# Patient Record
Sex: Male | Born: 1955 | Race: White | Hispanic: No | Marital: Married | Smoking: Never smoker
Health system: Southern US, Community
[De-identification: ages and names within clinical notes are randomized; demographics above are authoritative.]

## PROBLEM LIST (undated history)

## (undated) DIAGNOSIS — T8859XA Other complications of anesthesia, initial encounter: Secondary | ICD-10-CM

## (undated) DIAGNOSIS — R42 Dizziness and giddiness: Secondary | ICD-10-CM

## (undated) DIAGNOSIS — Q893 Situs inversus: Secondary | ICD-10-CM

## (undated) DIAGNOSIS — K219 Gastro-esophageal reflux disease without esophagitis: Secondary | ICD-10-CM

## (undated) DIAGNOSIS — G5600 Carpal tunnel syndrome, unspecified upper limb: Secondary | ICD-10-CM

## (undated) DIAGNOSIS — J45909 Unspecified asthma, uncomplicated: Secondary | ICD-10-CM

## (undated) DIAGNOSIS — T7840XA Allergy, unspecified, initial encounter: Secondary | ICD-10-CM

## (undated) DIAGNOSIS — I10 Essential (primary) hypertension: Secondary | ICD-10-CM

## (undated) DIAGNOSIS — F39 Unspecified mood [affective] disorder: Secondary | ICD-10-CM

## (undated) DIAGNOSIS — G8929 Other chronic pain: Secondary | ICD-10-CM

## (undated) DIAGNOSIS — E291 Testicular hypofunction: Secondary | ICD-10-CM

## (undated) DIAGNOSIS — S83271A Complex tear of lateral meniscus, current injury, right knee, initial encounter: Secondary | ICD-10-CM

## (undated) DIAGNOSIS — S43431A Superior glenoid labrum lesion of right shoulder, initial encounter: Secondary | ICD-10-CM

## (undated) DIAGNOSIS — I739 Peripheral vascular disease, unspecified: Secondary | ICD-10-CM

## (undated) DIAGNOSIS — I517 Cardiomegaly: Secondary | ICD-10-CM

## (undated) DIAGNOSIS — M25561 Pain in right knee: Secondary | ICD-10-CM

## (undated) DIAGNOSIS — M5441 Lumbago with sciatica, right side: Secondary | ICD-10-CM

## (undated) DIAGNOSIS — R7989 Other specified abnormal findings of blood chemistry: Secondary | ICD-10-CM

## (undated) DIAGNOSIS — M4316 Spondylolisthesis, lumbar region: Secondary | ICD-10-CM

## (undated) DIAGNOSIS — R7303 Prediabetes: Secondary | ICD-10-CM

## (undated) DIAGNOSIS — Z6841 Body Mass Index (BMI) 40.0 and over, adult: Secondary | ICD-10-CM

## (undated) DIAGNOSIS — Z01818 Encounter for other preprocedural examination: Secondary | ICD-10-CM

## (undated) DIAGNOSIS — N281 Cyst of kidney, acquired: Secondary | ICD-10-CM

## (undated) DIAGNOSIS — M7989 Other specified soft tissue disorders: Secondary | ICD-10-CM

## (undated) DIAGNOSIS — F5102 Adjustment insomnia: Secondary | ICD-10-CM

## (undated) DIAGNOSIS — R079 Chest pain, unspecified: Secondary | ICD-10-CM

## (undated) DIAGNOSIS — G4730 Sleep apnea, unspecified: Secondary | ICD-10-CM

## (undated) DIAGNOSIS — E559 Vitamin D deficiency, unspecified: Secondary | ICD-10-CM

## (undated) DIAGNOSIS — D751 Secondary polycythemia: Secondary | ICD-10-CM

## (undated) DIAGNOSIS — J029 Acute pharyngitis, unspecified: Secondary | ICD-10-CM

## (undated) DIAGNOSIS — M48061 Spinal stenosis, lumbar region without neurogenic claudication: Secondary | ICD-10-CM

## (undated) DIAGNOSIS — S83511A Sprain of anterior cruciate ligament of right knee, initial encounter: Secondary | ICD-10-CM

## (undated) DIAGNOSIS — M5442 Lumbago with sciatica, left side: Secondary | ICD-10-CM

## (undated) DIAGNOSIS — F411 Generalized anxiety disorder: Secondary | ICD-10-CM

## (undated) DIAGNOSIS — M5416 Radiculopathy, lumbar region: Secondary | ICD-10-CM

## (undated) DIAGNOSIS — M479 Spondylosis, unspecified: Secondary | ICD-10-CM

## (undated) DIAGNOSIS — M79662 Pain in left lower leg: Secondary | ICD-10-CM

## (undated) DIAGNOSIS — R1011 Right upper quadrant pain: Secondary | ICD-10-CM

## (undated) DIAGNOSIS — M79606 Pain in leg, unspecified: Secondary | ICD-10-CM

## (undated) HISTORY — PX: EYE SURGERY: SHX253

## (undated) HISTORY — PX: CARDIAC ELECTROPHYSIOLOGY STUDY AND ABLATION: SHX1294

---

## 2010-05-16 MED ORDER — MIDAZOLAM 1 MG/ML IJ SOLN
1 mg/mL | INTRAMUSCULAR | Status: DC | PRN
Start: 2010-05-16 — End: 2010-05-18
  Administered 2010-05-16 (×2): via INTRAVENOUS

## 2010-05-16 MED ORDER — MEPERIDINE (PF) 100 MG/ML INJ SOLUTION
100 mg/ml | INTRAMUSCULAR | Status: DC | PRN
Start: 2010-05-16 — End: 2010-05-18
  Administered 2010-05-16: 11:00:00 via INTRAVENOUS

## 2010-05-16 MED ORDER — SODIUM CHLORIDE 0.9 % IV
INTRAVENOUS | Status: DC
Start: 2010-05-16 — End: 2010-05-18
  Administered 2010-05-16: 11:00:00 via INTRAVENOUS

## 2010-05-16 MED FILL — MIDAZOLAM 1 MG/ML IJ SOLN: 1 mg/mL | INTRAMUSCULAR | Qty: 5

## 2010-05-16 MED FILL — DEMEROL (PF) 100 MG/ML INJECTION SYRINGE: 100 mg/mL | INTRAMUSCULAR | Qty: 1

## 2010-05-16 NOTE — Op Note (Signed)
Op Notes  signed by Serita Grammes at 06/13/10 1810                 Author: Serita Grammes  Service: --  Author Type: Physician       Filed: 06/13/10 1810  Date of Service: 05/16/10 1744  Status: Signed          Editor: Serita Grammes          <!--EPICS-->                            [facility]<BR>                           [address]<BR>                          [place] [address]<BR>                               [phone]<BR> <BR>                             OPERATIVE REPORT<BR> <BR> NAME:  Steven [name], Steven [name]                            MR:  [id number]<BM> LOC:  EN                    SEX:  M               ACCT:  [id number]  DOB:  October 08, 1956            AGE:  53              PT:  X<BR> ADMIT:  05/16/2010          DSCH:                 MSV:  GI<BR> <BR> <BR> PREPROCEDURE DIAGNOSIS:  Open access screening.<BR> <BR> POSTPROCEDURE DIAGNOSIS:  Polypoid protrusion from the appendiceal<BR>  orifice. Biopsies taken.<BR> <BR> NAME OF PROCEDURE:  Colonoscopy with biopsy.<BR> <BR> SURGEON:  Elwin Sleight. Rica Heather, MD<BR> <BR> ANESTHESIA<BR> 1. Demerol 100.<BR> 2. Versed 6.<BR> <BR> INSTRUMENTS:  CF-H180AL<BR> <BR> FINDINGS:  The digital rectal has good  sphincter tone. No masses. The<BR> prostate is 2+. No nodules and the scope was introduced and advanced to<BR> the cecum. He had a very redundant looping tortuous transverse both<BR> flexures, but the mucosa was normal.<BR> <BR> Once in the cecum and  the base of the cecum at the appendiceal orifice is<BR> a slight villous-like protrusion and biopsies were taken. I did not<BR> attempt to snare removed. It was not clearly a polyp, it was more or less<BR> just a fleshy protrusion along 1 side. Almost  as if it were mucosa that<BR> had inverted. The surrounding cecum was normal. The ileocecal valve was<BR> normal. The ascending colon was normal. Transverse both flexures normal.<BR> Splenic flexure and descending were normal. The sigmoid was tortuous,<BR>   contained numerous tics, otherwise normal. The rectum was normal.<BR> <BR> CONCLUSION<BR> 1. Diverticulosis.<BR> 2. Fleshy protrusion in the appendiceal orifice. Biopsies are pending. I<BR> suspect this is just an inverted mucosa.<BR> <BR> PLAN:  Await  biopsies and if typical appendiceal mucosa, we  will plan no<BR> further intervention. On the other hand, if this is a true villous or<BR> tubular kind of polyp, he will need a subsequent colonoscopy and<BR> polypectomy or either an appendectomy.<BR> <BR>  ADDENDUM:  CT of his abdomen may be a reasonable alternative depending on<BR> biopsy results.<BR> <BR> <BR> <BR> <BR> <BR> <BR> <BR> Serita Grammes, MD     A<BR> <BR>             This is an unverified document unless signed by physician.<BR> <BR> TID:   wmx                                      DT:  05/16/2010  5:44 P<BR> JOB:  [id number]        DOC#:  [id number]           DD:  05/16/2010<BR> <BR> cc:   Heather L. Alvan Dame, MD<BR>       Serita Grammes, MD<BR> <!--EPICE-->

## 2010-05-16 NOTE — Progress Notes (Signed)
Pt taking po fluids well. Passing flatus. No c/o pain. Discharged home via wheelchair with family. DISCHARGED BY BETH RICE

## 2010-05-16 NOTE — H&P (Signed)
[facility]   [address]   [place], [place]. [address]   [phone]     HISTORY AND PHYSICAL    NAME: Steven, [name] MR: [id number]  LOC: EN SEX: Steven [name]: [id number]  DOB: [birth year] AGE: 54 PT: X  ADMIT: 05/16/2010 DSCH: MSV: GI      HISTORY: This is a 54 year old with no real health issues, who is in for  a screening colonoscopy. He has a negative family history, and a negative  past history.    PHYSICAL EXAMINATION  GENERAL: He is alert and oriented.  LUNGS: Clear.  CARDIAC: Regular rhythm.  ABDOMEN: Rounded, full, no organomegaly, or mass.    PLAN: Colonoscopy.                Serita Grammes, MD     This is an unverified document unless signed by physician.    TID: wmx DT: 05/16/2010 8:11 A  JOB: [id number] DOC#: [id number] DD: 05/16/2010     cc: Serita Grammes, MD

## 2010-05-16 NOTE — Op Note (Signed)
[facility]   [address]   [place], [place]. [address]   [phone]     OPERATIVE REPORT    NAME: Steven [name], Steven [name] MR: [id number]  LOC: EN SEX: Gillermina Hu: [id number]  DOB: [birth year] AGE: 54 PT: X  ADMIT: 05/16/2010 DSCH: MSV: GI      PREPROCEDURE DIAGNOSIS: Open access screening.    POSTPROCEDURE DIAGNOSIS: Polypoid protrusion from the appendiceal  orifice. Biopsies taken.    NAME OF PROCEDURE: Colonoscopy with biopsy.    SURGEON: Elwin Sleight. Evonnie Dawes, MD    ANESTHESIA  1. Demerol 100.  2. Versed 6.    INSTRUMENTS: CF-H180AL    FINDINGS: The digital rectal has good sphincter tone. No masses. The  prostate is 2+. No nodules and the scope was introduced and advanced to  the cecum. He had a very redundant looping tortuous transverse both  flexures, but the mucosa was normal.    Once in the cecum and the base of the cecum at the appendiceal orifice is  a slight villous-like protrusion and biopsies were taken. I did not  attempt to snare removed. It was not clearly a polyp, it was more or less  just a fleshy protrusion along 1 side. Almost as if it were mucosa that  had inverted. The surrounding cecum was normal. The ileocecal valve was  normal. The ascending colon was normal. Transverse both flexures normal.  Splenic flexure and descending were normal. The sigmoid was tortuous,  contained numerous tics, otherwise normal. The rectum was normal.    CONCLUSION  1. Diverticulosis.  2. Fleshy protrusion in the appendiceal orifice. Biopsies are pending. I  suspect this is just an inverted mucosa.    PLAN: Await biopsies and if typical appendiceal mucosa, we will plan no  further intervention. On the other hand, if this is a true villous or  tubular kind of polyp, he will need a subsequent colonoscopy and  polypectomy or either an appendectomy.    ADDENDUM: CT of his abdomen may be a reasonable alternative depending on  biopsy results.                Serita Grammes, MD A      This is an unverified document unless signed by physician.    TID: wmx DT: 05/16/2010 5:44 P  JOB: [id number] DOC#: [id number] DD: 05/16/2010    cc: Herbert Seta L. Alvan Dame, MD   Serita Grammes, MD

## 2011-04-29 LAB — HM COLONOSCOPY

## 2011-11-02 LAB — MRI/CT POC CREATININE
Creatinine (POC): 1.1 MG/DL — ABNORMAL HIGH (ref 0.6–1.0)
GFRAA, POC: >60 mL/min/{1.73_m2} (ref >60)
GFRNA, POC: >60 mL/min/{1.73_m2} (ref >60)

## 2011-11-02 MED ORDER — SALINE PERIPHERAL FLUSH PRN
Freq: Once | INTRAMUSCULAR | Status: AC
Start: 2011-11-02 — End: 2011-11-02
  Administered 2011-11-02: 20:00:00

## 2011-11-02 MED ORDER — DIATRIZOATE MEGLUMINE & SODIUM 66 %-10 % ORAL SOLN
66-10 % | Freq: Once | ORAL | Status: AC
Start: 2011-11-02 — End: 2011-11-02
  Administered 2011-11-02: 20:00:00 via ORAL

## 2011-11-02 MED ORDER — SODIUM CHLORIDE 0.9% BOLUS IV
0.9 % | Freq: Once | INTRAVENOUS | Status: AC
Start: 2011-11-02 — End: 2011-11-02
  Administered 2011-11-02: 20:00:00 via INTRAVENOUS

## 2011-11-02 MED ORDER — IOVERSOL 350 MG/ML IV SOLN
350 mg iodine/mL | Freq: Once | INTRAVENOUS | Status: AC
Start: 2011-11-02 — End: 2011-11-02
  Administered 2011-11-02: 20:00:00 via INTRAVENOUS

## 2011-11-12 NOTE — Progress Notes (Signed)
Outpatient Rehab at [facility]   [address], [address] [place], [place] [address]  Phone:[phone]   Fax:[phone]  OUTPATIENT DAILY NOTE    NAME/AGE/GENDER: Steven [name] is a 56 y.o. male.     DATE: 11/12/2011    Patient did not show up for appointment today due to unknown reason.  Will plan to follow up on next scheduled visit.    Marlane Mingle, PT

## 2011-11-21 NOTE — Progress Notes (Signed)
Outpatient Rehab at [facility]   [address] [place], [place] [address]  [phone]   Fax:[phone]  Outpatient PHYSICAL THERAPY: Initial Assessment  Fall Risk Score: 1 (? 5 = High Risk)  Treatment Diagnosis: Lumbago  REFERRING PHYSICIAN: Dr. Alvan Dame  MD Orders: Eval and treat  Return Physician Appointment: TBD  MEDICAL/REFERRING DIAGNOSIS: Lumbago, L LE weakness  DATE OF ONSET: 09/2011   PRIOR LEVEL OF FUNCTION: Independent  PRECAUTIONS/ALLERGIES: NKA  ASSESSMENT:  ????????This section established at most recent assessment??????????  PROBLEM LIST (Impairments causing functional limitations):  1. Decreased Strength affecting function  2. Decreased ADL/Functional Activities  3. Increased Pain affecting function  GOALS: (Goals have been discussed and agreed upon with patient.)  SHORT-TERM FUNCTIONAL GOALS: Time Frame: 4 weeks  1. Decrease OLBPI 2-3% to allow sitting with less pain.  2. Increase L hip strength to allow t-mill without limping.  DISCHARGE GOALS: Time Frame: 8 weeks  1. Decrease OLBPI +5% to allow an independent HEP.  2. Increase L hip strength to allow proper step downs without pain.  3. Progress to a HEP.  REHABILITATION POTENTIAL FOR STATED GOALS: goodPLAN OF CARE:  INTERVENTIONS PLANNED: (Benefits and precautions of physical therapy have been discussed with the patient.)  1. home exercise program (HEP)  2. therapeutic exercise/strengthening  TREATMENT PLAN EFFECTIVE DATES: 11/21/2011 TO 01/19/2012  FREQUENCY/DURATION: Follow patient 1 time a week for 8 weeks to address above goals.  Regarding Steven [name]'s therapy, I certify that the treatment plan above will be carried out by a therapist or under their direction.  Thank you for this referral,  Lilla Shook   PT  Referring Physician Signature: Dr. Alvan Dame         Date                       SUBJECTIVE:  History of Present Injury/Illness (Reason for Referral): The patient reported injuring his L lumbar region with  intermittent L sciatica approximately three years ago. Since then his symptoms intermittently reappear and he he has noted an increased L LE limp with fatigue. He has been referred to physical therapy for treatment.  Present Symptoms: L SI pain and L LE weakness with a limp.   Pain Intensity 1: 2  Dominant Side: right  Past Medical History: Occasional Lumbago  Current Medications: Per Chart   Date Last Reviewed: 11/21/2011  Social History/Home Situation: Married , Independent  Work/Activity History: Office  OBJECTIVE:  Outcome Measure: Tool Used: Modified Oswestry Low Back Pain Questionnaire  Score:  Initial: 11% Disability Most Recent: X% Disability (Date: -- )    Interpretation of Score: Each section is scored on a 0-5 scale, 5 representing the greatest disability.  The scores of each section are added together for a total score of 50.  This number is multiplied by 2 to get a percent score. This value represents the percentage disability: 0-20% minimal disability; 20-40% moderate disability; 40-60% severe disability; 60-80% crippled; and 80-100% bed-bound or exaggerated symptom behavior.  Minimal detectable change is 8% and minimal clinical important difference is 6%.  Observation/Orthostatic Postural Assessment:   WNL  Palpation:   Mild tenderness to L SI joint.  ROM: WNL                          Strength:    -4/5 for L gluteus medius, abductors and quads.   5/5 for R LE.  Functional Mobility:   Independent with a mild L limp.  Balance:   Good  TREATMENT:    (In addition to Assessment/Re-Assessment sessions the following treatments were rendered)  Therapeutic Exercise: ( 45):  The patient received an evaluation followed by exercises as below to improve mobility and strength.  Required minimal verbal and manual cues to promote proper body mechanics.  Progressed resistance, range and repetitions as indicated.   The patient received instruction and demonstrated strengthening exercises for his L LE to  include sidelying clam shell with green t-band, sidelying abduction, and single leg step downs from a 6" step.  He is to continue his use of his t-mill and inversion table as he presently performs.    Manual Therapy (     ):   Therapeutic Modalities:                                                                                               HEP: As above; handouts given to patient for all exercises.  ______________________________________________________________________________________________________  Treatment Assessment:  Patient???s response to today???s treatment session was tolerated well with no complications.  The patient tolerated the exercise instruction well with no exacerbation in symptoms. He is to incorporate the exercises in his present routine and will be revaluated in a few weeks to DC to a HEP.  Progression/Medical Necessity:   ?? Skilled intervention continues to be required due to low back pain, and L LE weakness limiting ADLs..   Reason for Continuation of Services/Other Comments:  ?? Patient continues to require skilled intervention due to L LE weakness and low back pain limiting ADLs..  Recommendations/Intent for next treatment session: "Treatment next visit will focus on advancements to more challenging activities".  Total Treatment Duration:  PT Patient Time In/Time Out  Time In: 1100  Time Out: 1200    Lilla Shook PT

## 2011-11-21 NOTE — Progress Notes (Signed)
Ambulatory/Rehab Services H2 Model Falls Risk Assessment    Risk Factor Pts. ??   Confusion/Disorientation/Impulsivity  []    4 ??   Symptomatic Depression  []   2 ??   Altered Elimination  []   1 ??   Dizziness/Vertigo  []   1 ??   Gender (Male)  [x]   1 ??   Any administered antiepileptics (anticonvulsants):  []   2 ??   Any administered benzodiazepines:  []   1 ??   Visual Impairment (specify):  []   1 ??   Portable Oxygen Use  []   1 ??   Orthostatic ? BP  []   1 ??   History of Recent Falls (within 3 mos.)  []   5     Ability to Rise from Chair (choose one) Pts. ??   Ability to rise in a single movement  []   0 ??   Pushes up, successful in one attempt  []   1 ??   Multiple attempts, but successful  []   3 ??   Unable to rise without assistance  []   4   Total: (5 or greater = High Risk) 1     Falls Prevention Plan:   []                Physical Limitations to Exercise (specify):   []                Mobility Assistance Device (type):   []                Exercise/Equipment Adaptation (specify):    ??2010 [employer] All Rights Reserved. [place] Patent #7,282,031. Federal Law prohibits the replication, distribution or use without written permission from [employer]

## 2012-04-06 NOTE — Progress Notes (Signed)
Outpatient Rehab at [facility]   [address], [place], [place] [address]  Phone:[phone]   Fax:[phone]  Outpatient PHYSICAL THERAPY: Discharge  Fall Risk Score: 1 (? 5 = High Risk)  Treatment Diagnosis: Lumbago  REFERRING PHYSICIAN: Dr. Alvan Dame  MD Orders: Eval and treat  Return Physician Appointment: TBD  MEDICAL/REFERRING DIAGNOSIS: Lumbago, L LE weakness  DATE OF ONSET: 09/2011   PRIOR LEVEL OF FUNCTION: Independent  PRECAUTIONS/ALLERGIES: NKA  ASSESSMENT:  ????????This section established at most recent assessment??????????  PROBLEM LIST (Impairments causing functional limitations):  1. Decreased Strength affecting function  2. Decreased ADL/Functional Activities  3. Increased Pain affecting function  GOALS: (Goals have been discussed and agreed upon with patient.)  SHORT-TERM FUNCTIONAL GOALS: Time Frame: 4 weeks    UNABLE TO ASSESS GOALS     VOLUNTARY DC.  1. Decrease OLBPI 2-3% to allow sitting with less pain.  2. Increase L hip strength to allow t-mill without limping.  DISCHARGE GOALS: Time Frame: 8 weeks  1. Decrease OLBPI +5% to allow an independent HEP.  2. Increase L hip strength to allow proper step downs without pain.  3. Progress to a HEP.  REHABILITATION POTENTIAL FOR STATED GOALS: goodPLAN OF CARE:  INTERVENTIONS PLANNED: (Benefits and precautions of physical therapy have been discussed with the patient.)  1. home exercise program (HEP)  2. therapeutic exercise/strengthening  TREATMENT PLAN EFFECTIVE DATES: 11/21/2011 TO 01/19/2012  FREQUENCY/DURATION: Follow patient 1 time a week for 8 weeks to address above goals.  Regarding Andre Swander Brumley's therapy, I certify that the treatment plan above will be carried out by a therapist or under their direction.  Thank you for this referral,  Lilla Shook, PT   PT  Referring Physician Signature: Dr. Alvan Dame         Date                       SUBJECTIVE:  History of Present Injury/Illness (Reason for Referral): The patient reported  injuring his L lumbar region with intermittent L sciatica approximately three years ago. Since then his symptoms intermittently reappear and he he has noted an increased L LE limp with fatigue. He has been referred to physical therapy for treatment.  Present Symptoms: L SI pain and L LE weakness with a limp.   Pain Intensity 1: 2  Dominant Side: right  Past Medical History: Occasional Lumbago  Current Medications: Per Chart   Date Last Reviewed: 11/21/2011  Social History/Home Situation: Married , Independent  Work/Activity History: Office  OBJECTIVE:  Outcome Measure: Tool Used: Modified Oswestry Low Back Pain Questionnaire  Score:  Initial: 11% Disability Most Recent: X% Disability (Date: -- )    Interpretation of Score: Each section is scored on a 0-5 scale, 5 representing the greatest disability.  The scores of each section are added together for a total score of 50.  This number is multiplied by 2 to get a percent score. This value represents the percentage disability: 0-20% minimal disability; 20-40% moderate disability; 40-60% severe disability; 60-80% crippled; and 80-100% bed-bound or exaggerated symptom behavior.  Minimal detectable change is 8% and minimal clinical important difference is 6%.  Observation/Orthostatic Postural Assessment:   WNL  Palpation:   Mild tenderness to L SI joint.  ROM: WNL                          Strength:    -4/5 for L  gluteus medius, abductors and quads.   5/5 for R LE.             Functional Mobility:   Independent with a mild L limp.  Balance:   Good  TREATMENT:    (In addition to Assessment/Re-Assessment sessions the following treatments were rendered)  Recommendations/Intent for next treatment session:Voluntary DC.    Lilla Shook, PT

## 2012-04-07 ENCOUNTER — Encounter

## 2012-05-21 MED ORDER — TESTOSTERONE CYPIONATE 200 MG/ML IM OIL
200 mg/mL | Freq: Once | INTRAMUSCULAR | Status: AC
Start: 2012-05-21 — End: 2012-05-21

## 2012-06-04 MED ORDER — TESTOSTERONE CYPIONATE 200 MG/ML IM OIL
200 mg/mL | Freq: Once | INTRAMUSCULAR | Status: AC
Start: 2012-06-04 — End: 2012-06-04

## 2012-06-18 MED ORDER — TESTOSTERONE CYPIONATE 200 MG/ML IM OIL
200 mg/mL | Freq: Once | INTRAMUSCULAR | Status: AC
Start: 2012-06-18 — End: 2012-06-18

## 2012-07-02 MED ORDER — DULOXETINE 30 MG CAP, DELAYED RELEASE
30 mg | ORAL_CAPSULE | Freq: Every day | ORAL | Status: DC
Start: 2012-07-02 — End: 2013-02-25

## 2012-07-02 MED ORDER — DULOXETINE 60 MG CAP, DELAYED RELEASE
60 mg | ORAL_CAPSULE | Freq: Every day | ORAL | Status: DC
Start: 2012-07-02 — End: 2012-10-02

## 2012-07-02 MED ORDER — ALPRAZOLAM 1 MG TAB
1 mg | ORAL_TABLET | Freq: Three times a day (TID) | ORAL | Status: DC | PRN
Start: 2012-07-02 — End: 2014-04-21

## 2012-07-02 MED ORDER — NEBIVOLOL 20 MG TAB
20 mg | ORAL_TABLET | Freq: Every evening | ORAL | Status: DC
Start: 2012-07-02 — End: 2013-02-25

## 2012-07-02 MED ORDER — LEVALBUTEROL HFA 45 MCG/ACTUATION AEROSOL INHALER
45 mcg/actuation | RESPIRATORY_TRACT | Status: DC | PRN
Start: 2012-07-02 — End: 2013-02-25

## 2012-07-02 NOTE — Patient Instructions (Signed)
Heart Attack: After Your Visit  Your Care Instructions  A heart attack (myocardial infarction, or MI) occurs when one or more of the coronary arteries, which supply the heart with oxygen-rich blood, is blocked. A blockage usually occurs when plaque inside the artery breaks open and a blood clot forms in the artery.  After a heart attack, you may be worried about your future. Over the next several weeks, your heart will start to heal. Although it is sometimes hard to break old habits, you can prevent another heart attack by making some lifestyle changes and by taking medicines. You may use the following information for ideas about what to do at home to speed your recovery.  Follow-up care is a key part of your treatment and safety. Be sure to make and go to all appointments, and call your doctor if you are having problems. It???s also a good idea to know your test results and keep a list of the medicines you take.  How can you care for yourself at home?  Activity  ?? Increase your activities slowly. Take short rest breaks when you get tired.   ?? If your doctor recommends it, get more exercise. Walking is a good choice. Bit by bit, increase the amount you walk every day. Try for at least 30 minutes on most days of the week. You also may want to swim, bike, or do other activities. Talk with your doctor before you start an exercise program to make sure it is safe for you.   ?? If your doctor has not set you up with a cardiac rehabilitation (rehab) program, talk to him or her about whether that is right for you. Cardiac rehab includes supervised exercise, help with diet and lifestyle changes, and emotional support. It may reduce your risk of future heart problems.   ?? Do not drive until your doctor says you can.   ?? You can have sex as soon as you feel ready for it. Often this means when you can easily walk around or climb stairs. Talk with your doctor if you have any concerns. Do not take sildenafil citrate (Viagra),  tadalafil (Cialis), or vardenafil (Levitra) if you are taking nitroglycerin.   Lifestyle changes  ?? Do not smoke. Smoking increases your risk of another heart attack. If you need help quitting, talk to your doctor about stop-smoking programs and medicines. These can increase your chances of quitting for good.   ?? Eat a heart-healthy diet that is low in cholesterol, saturated fat, and salt, and is full of fruits, vegetables and whole-grains. Eat at least two servings of fish each week. You may get more details about how to eat healthy, but these tips can help you get started.   ?? Avoid colds and flu. Get a pneumococcal vaccine shot. If you have had one before, ask your doctor whether you need a second dose. Get the flu vaccine every fall. If you must be around people with colds or flu, wash your hands often.   Medicines  ?? Take your medicines exactly as prescribed. Call your doctor if you think you are having a problem with your medicine. You will get more details on the specific medicines your doctor prescribes. You may need several medicines.   ?? Angiotensin-converting enzyme (ACE) inhibitors, beta-blockers, and statins can help prevent another heart attack. ACE inhibitors control your blood pressure, and statins help lower cholesterol.   ?? Aspirin and other blood thinners help prevent blood clots. Blood clots can cause   a stroke or heart attack.   ?? If your doctor has given you nitroglycerin, keep it with you at all times. If you have angina symptoms, such as chest pain, sit down and rest, and take the first dose of nitroglycerin as directed. If symptoms get worse or are not getting better within 5 minutes, call 911 immediately. Stay on the phone with the emergency operator; he or she will give you further instructions.   ?? Be sure to tell your doctor about any angina symptoms, such as chest pain, you have had, even if they went away.   ?? Do not take any over-the-counter medicines, vitamins, or herbal products  without talking to your doctor first.   Mental health  ?? Talk to your family, friends, or a counselor about your feelings. It is normal to feel frightened, angry, hopeless, helpless, and even guilty. Talking openly about bad feelings can help you cope. If you have symptoms of depression, talk to your doctor.   When should you call for help?  Call 911 anytime you think you may need emergency care. For example, call if:  ?? You have symptoms of a heart attack. These may include:   ?? Chest pain or pressure, or a strange feeling in the chest.   ?? Sweating.   ?? Shortness of breath.   ?? Nausea or vomiting.   ?? Pain, pressure, or a strange feeling in the back, neck, jaw, or upper belly or in one or both shoulders or arms.   ?? Lightheadedness or sudden weakness.   ?? A fast or irregular heartbeat.   After you call 911, the operator may tell you to chew 1 adult-strength or 2 to 4 low-dose aspirin. Wait for an ambulance. Do not try to drive yourself.  ?? You passed out (lost consciousness).   ?? You feel like you are having another heart attack.   Call your doctor now or seek immediate medical care if:  ?? You have had any chest pain, even if it has gone away.   ?? You have new or increased shortness of breath.   ?? You are dizzy or lightheaded, or you feel like you may faint.   Watch closely for changes in your health, and be sure to contact your doctor if you have any problems.    Where can you learn more?    Go to http://www.healthwise.net/[facility]   Enter H564 in the search box to learn more about "Heart Attack: After Your Visit."    ?? 2006-2013 [employer] Inc[employer]. Care instructions adapted under license by [employer] (which disclaims liability or warranty for this information). This care instruction is for use with your licensed healthcare professional. If you have questions about a medical condition or this instruction, always ask your healthcare professional. Healthwise, Incorporated disclaims any warranty or  liability for your use of this information.  Content Version: 9.7.130178; Last Revised: May 17, 2010

## 2012-07-02 NOTE — Progress Notes (Signed)
HISTORY OF PRESENT ILLNESS  Steven [name] is a 56 y.o. male.  Cough  The history is provided by the patient. This is a new problem. The current episode started 2 days ago. The problem occurs constantly. The problem has not changed since onset.Associated symptoms include abdominal pain (epigastric pressure during the episodes) and shortness of breath (just during the 2 episodes he had recently). Pertinent negatives include no chest pain (denies) and no headaches. The symptoms are aggravated by exertion and walking. Nothing relieves the symptoms. He has tried nothing for the symptoms.   Panic Attack  The history is provided by the patient. This is a recurrent problem. Episode onset: He has a history of generalized anxiety but he has had 2 "scarey" episodes at night recently that have caused him to come in to get treated.  The problem occurs every several days (He has the anxiety constantly but he had the "panic attacks" a few days apart. He believes that they were panic attacks but his wife believes that the 2nd one that happened a few nights ago was cardiac in origin. ). The problem has been gradually worsening. Associated symptoms include abdominal pain (epigastric pressure during the episodes) and shortness of breath (just during the 2 episodes he had recently). Pertinent negatives include no chest pain (denies) and no headaches. Associated symptoms comments: During the 2nd episode he was sweating profusely, short of breath, had orthopnea, was very anxious and c/o epigastric "pressure." This episode lasted 4 hours. His wife stated that he just didn't look right and she tried to get him to go to the ER but he refused.  . The symptoms are aggravated by stress (States that he has alot of stress at work right now.). Nothing (States that he has taken Cymbalta in the past and it really helped him. ) relieves the symptoms. He has tried nothing for the symptoms.   He BP is higher today than it has been since 1/13 and the  only change that has been made in his regimen is that he started on Testosterone shots in mid-July because his testosterone level was 145 on 04/28/12 even though he was applying Androgel as directed.     Review of Systems   Constitutional: Positive for malaise/fatigue. Negative for fever, chills, weight loss and diaphoresis.   HENT: Negative for ear pain, congestion, sore throat and neck pain.    Eyes: Negative for blurred vision, photophobia, pain and redness.   Respiratory: Positive for cough, sputum production and shortness of breath (just during the 2 episodes he had recently). Negative for wheezing.    Cardiovascular: Positive for palpitations (during the episodes). Negative for chest pain (denies) and leg swelling.   Gastrointestinal: Positive for abdominal pain (epigastric pressure during the episodes). Negative for heartburn, nausea, vomiting, diarrhea and constipation.   Genitourinary: Negative for dysuria, urgency and frequency.   Musculoskeletal: Negative for myalgias and back pain.   Skin: Negative for itching and rash.   Neurological: Negative for dizziness, sensory change, focal weakness, weakness and headaches.   Psychiatric/Behavioral: Negative for depression and suicidal ideas. The patient is nervous/anxious and has insomnia.      Blood pressure 154/98, pulse 77, temperature 98.1 ??F (36.7 ??C), temperature source Oral, height 5\' 11"  (1.803 m), weight 254 lb 3.2 oz (115.304 kg), SpO2 98.00%.    Physical Exam   Nursing note and vitals reviewed.  Constitutional: He is oriented to person, place, and time. He appears well-developed and well-nourished. He appears ill (he looks  exhausted). No distress.   HENT:   Head: Normocephalic and atraumatic.   Right Ear: Hearing, tympanic membrane, external ear and ear canal normal.   Left Ear: Hearing, tympanic membrane, external ear and ear canal normal.   Nose: Mucosal edema present. No rhinorrhea. Right sinus exhibits no maxillary sinus tenderness and no frontal  sinus tenderness. Left sinus exhibits no maxillary sinus tenderness and no frontal sinus tenderness.   Mouth/Throat: Uvula is midline, oropharynx is clear and moist and mucous membranes are normal. No oral lesions.   Neck: Normal range of motion. Neck supple. No JVD present. No thyromegaly present.   Cardiovascular: Normal rate, regular rhythm, normal heart sounds and intact distal pulses.  Exam reveals no gallop and no friction rub.    No murmur heard.  Pulmonary/Chest: Effort normal and breath sounds normal. He has no wheezes. He exhibits no tenderness.        He has a frequent, tight, wheezy cough.   Abdominal: Soft. Bowel sounds are normal. He exhibits no distension. There is no tenderness.   Musculoskeletal: Normal range of motion. He exhibits no edema.   Neurological: He is alert and oriented to person, place, and time. He has normal strength. Gait normal.   Skin: Skin is warm, dry and intact.   Psychiatric: His speech is normal and behavior is normal. Judgment and thought content normal. His mood appears anxious. His affect is not angry, not blunt, not labile and not inappropriate. Cognition and memory are normal. He does not exhibit a depressed mood.   EKG today shows sinus rhythm with HR 71 and a Q wave in the septal lead. His last EKG on 08/24/11 showed NSR with HR 68.     ASSESSMENT and PLAN  1. Shortness of breath  ELECTROCARDIOGRAM, COMPLETE, REFERRAL TO CARDIOLOGY   2. Abnormal finding on EKG  REFERRAL TO CARDIOLOGY   3. Essential hypertension, benign  nebivolol (BYSTOLIC) 20 mg tablet   4. Acute bronchitis     5. Acute bronchospasm  levalbuterol tartrate (XOPENEX HFA) 45 mcg/actuation inhaler   6. Generalized anxiety disorder  DULoxetine (CYMBALTA) 30 mg capsule, DULoxetine (CYMBALTA) 60 mg capsule   7. Panic disorder without agoraphobia  ALPRAZolam (XANAX) 1 mg tablet   8. Other testicular hypofunction     It is late in the day and the lab has closed so he will RTO tomorrow to have a CBC, CMP, Lipid  Panel, TSH, HgbA1C, Testosterone, Free Testosterone, PSA, UA and urine microalbumin done. Cardiology referral done for f/u on the abnormal EKG. I have increased his Bystolic from 10 mg qd to 20 mg qhs. He will continue to take the Lisinopril 40 mg qAM. I have restarted him on Cymbalta as it really helped him in the past. He will take 30 mg qhs X 1 week and then go up to 60 mg qhs. I gave him an Rx for Xanax to take if he has another panic attack. I told him to go to the ER if the Xanax does not help and he is still SOB and sweating profusely 20-30 min after taking the Xanax. He will RTO in 1 week for a BP check and in 1 month for a recheck with me or sooner prn.

## 2012-07-04 LAB — AMB POC COMPLETE CBC,AUTOMATED ENTER
ABS. GRANS (POC): 5 10*3/uL
ABS. LYMPHS (POC): 1.7 10*3/uL
ABS. MONOS (POC): 0.4 10*3/uL
GRANULOCYTES (POC): 69.8 %
HCT (POC): 55 %
HGB (POC): 18.4 g/dL
LYMPHOCYTES (POC): 24 %
MCH (POC): 30.8 pg
MCHC (POC): 33.5 g/dL
MCV (POC): 91.9 fL
MONOCYTES (POC): 6.2 %
MPV (POC): 8.2 fL
PLATELET (POC): 178 10*3/uL
RBC (POC): 5.99 M/uL
RDW (POC): 13.9 %
WBC (POC): 7.2 10*3/uL

## 2012-07-04 LAB — AMB POC URINALYSIS DIP STICK MANUAL W/ MICRO
Bilirubin (UA POC): NEGATIVE
Blood (UA POC): NEGATIVE
Glucose (UA POC): NEGATIVE
Ketones (UA POC): NEGATIVE
Leukocyte esterase (UA POC): NEGATIVE
Nitrites (UA POC): NEGATIVE
Specific gravity (UA POC): 1.01 (ref 1.001–1.035)
Urobilinogen (UA POC): 0.2 (ref 0.2–1)
pH (UA POC): 7 (ref 4.6–8.0)

## 2012-07-04 NOTE — Addendum Note (Signed)
Addended by: Delman Kitten on: 07/04/2012 10:14 AM     Modules accepted: Orders

## 2012-07-05 LAB — TESTOSTERONE, TOTAL, ADULT MALE: Testosterone: 284 ng/dL — ABNORMAL LOW (ref 348–1197)

## 2012-07-05 LAB — TESTOSTERONE, FREE: Free testosterone (Direct): 6.4 pg/mL — ABNORMAL LOW (ref 7.2–24.0)

## 2012-07-05 LAB — METABOLIC PANEL, COMPREHENSIVE
A-G Ratio: 1.7 (ref 1.1–2.5)
ALT (SGPT): 40 IU/L (ref 0–44)
AST (SGOT): 34 IU/L (ref 0–40)
Albumin: 4.3 g/dL (ref 3.5–5.5)
Alk. phosphatase: 72 IU/L (ref 44–102)
BUN/Creatinine ratio: 14 (ref 9–20)
BUN: 16 mg/dL (ref 6–24)
Bilirubin, total: 0.5 mg/dL (ref 0.0–1.2)
CO2: 20 mmol/L (ref 19–28)
Calcium: 9.1 mg/dL (ref 8.7–10.2)
Chloride: 103 mmol/L (ref 97–108)
Creatinine: 1.11 mg/dL (ref 0.76–1.27)
GFR est AA: 86 mL/min/{1.73_m2} (ref >59)
GFR est non-AA: 74 mL/min/{1.73_m2} (ref >59)
GLOBULIN, TOTAL: 2.5 g/dL (ref 1.5–4.5)
Glucose: 89 mg/dL (ref 65–99)
Potassium: 5 mmol/L (ref 3.5–5.2)
Protein, total: 6.8 g/dL (ref 6.0–8.5)
Sodium: 138 mmol/L (ref 134–144)

## 2012-07-05 LAB — LIPID PANEL
Cholesterol, total: 178 mg/dL (ref 100–199)
HDL Cholesterol: 31 mg/dL — ABNORMAL LOW (ref >39)
LDL, calculated: 115 mg/dL — ABNORMAL HIGH (ref 0–99)
Triglyceride: 160 mg/dL — ABNORMAL HIGH (ref 0–149)
VLDL, calculated: 32 mg/dL (ref 5–40)

## 2012-07-05 LAB — MICROALBUMIN:CREATININE RATIO, RANDOM URINE
Creatinine, urine random: 178.7 mg/dL (ref 22.0–328.0)
Microalb/Creat ratio (ug/mg creat.): 4.9 mg/g creat (ref 0.0–30.0)
Microalbumin, urine: 8.7 ug/mL (ref 0.0–17.0)

## 2012-07-05 LAB — HEMOGLOBIN A1C WITH EAG: Hemoglobin A1c: 5.5 % (ref 4.8–5.6)

## 2012-07-05 LAB — PSA, DIAGNOSTIC (PROSTATE SPECIFIC AG): Prostate Specific Ag: 0.4 ng/mL (ref 0.0–4.0)

## 2012-07-05 LAB — TSH 3RD GENERATION: TSH: 1.62 u[IU]/mL (ref 0.450–4.500)

## 2012-07-07 MED ORDER — SIMVASTATIN 20 MG TAB
20 mg | ORAL_TABLET | Freq: Every evening | ORAL | Status: DC
Start: 2012-07-07 — End: 2012-10-02

## 2012-07-07 NOTE — Progress Notes (Signed)
Quick Note:    I called Steven [name] and went over his lab results with him in detail. He has only had 2 testosterone injections so I will not change his dose at this time and we will recheck his labs again in early October. He is taking an aspirin daily. His LDL is mildly elevated at 161 but I told him I want him to start on Zocor 20 mg qhs in light of his current symptoms. He is to see a cardiologist on Wednesday, 07/09/12. I will e-prescribe the Zocor for him. kkb  ______

## 2012-07-07 NOTE — Addendum Note (Signed)
Addended by: Delman Kitten on: 07/07/2012 07:48 PM     Modules accepted: Orders

## 2012-07-09 ENCOUNTER — Encounter

## 2012-07-15 ENCOUNTER — Encounter

## 2012-07-15 MED ORDER — SALINE PERIPHERAL FLUSH PRN
Freq: Once | INTRAMUSCULAR | Status: AC
Start: 2012-07-15 — End: 2012-07-15
  Administered 2012-07-15: 12:00:00

## 2012-07-15 MED ORDER — SODIUM CHLORIDE 0.9% BOLUS IV
0.9 % | Freq: Once | INTRAVENOUS | Status: AC
Start: 2012-07-15 — End: 2012-07-15
  Administered 2012-07-15: 12:00:00 via INTRAVENOUS

## 2012-07-15 MED ORDER — IOVERSOL 350 MG/ML IV SOLN
350 mg iodine/mL | Freq: Once | INTRAVENOUS | Status: AC
Start: 2012-07-15 — End: 2012-07-15
  Administered 2012-07-15: 12:00:00 via INTRAVENOUS

## 2012-07-16 MED ORDER — TESTOSTERONE CYPIONATE 200 MG/ML IM OIL
200 mg/mL | Freq: Once | INTRAMUSCULAR | Status: AC
Start: 2012-07-16 — End: 2012-07-16

## 2012-08-01 LAB — AMB POC COMPLETE CBC,AUTOMATED ENTER
ABS. GRANS (POC): 4.2 10*3/uL (ref 1.4–6.5)
ABS. LYMPHS (POC): 1.8 10*3/uL (ref 1.2–3.4)
ABS. MONOS (POC): 0.5 10*3/uL (ref 0.1–0.6)
GRANULOCYTES (POC): 64.9 % (ref 42.2–75.2)
HCT (POC): 55 % (ref 35–60)
HGB (POC): 18.2 g/dL (ref 11–18)
LYMPHOCYTES (POC): 27.4 % (ref 20.5–51.1)
MCH (POC): 30.2 pg (ref 27–31)
MCHC (POC): 33.1 g/dL (ref 33–37)
MCV (POC): 91.2 fL (ref 80–99.9)
MONOCYTES (POC): 7.7 % (ref 1.7–9.3)
MPV (POC): 8.1 fL (ref 7.8–11)
PLATELET (POC): 153 10*3/uL (ref 150–450)
RBC (POC): 6.04 M/uL (ref 4–6)
RDW (POC): 13.5 % (ref 11.6–13.7)
WBC (POC): 6.4 10*3/uL (ref 4.5–10.5)

## 2012-08-01 MED ORDER — PHENTERMINE 37.5 MG TAB
37.5 mg | ORAL_TABLET | ORAL | Status: DC
Start: 2012-08-01 — End: 2013-02-25

## 2012-08-02 LAB — METABOLIC PANEL, COMPREHENSIVE
A-G Ratio: 1.8 (ref 1.1–2.5)
ALT (SGPT): 38 IU/L (ref 0–44)
AST (SGOT): 31 IU/L (ref 0–40)
Albumin: 4.3 g/dL (ref 3.5–5.5)
Alk. phosphatase: 64 IU/L (ref 44–102)
BUN/Creatinine ratio: 17 (ref 9–20)
BUN: 19 mg/dL (ref 6–24)
Bilirubin, total: 0.3 mg/dL (ref 0.0–1.2)
CO2: 19 mmol/L (ref 19–28)
Calcium: 8.8 mg/dL (ref 8.7–10.2)
Chloride: 101 mmol/L (ref 97–108)
Creatinine: 1.1 mg/dL (ref 0.76–1.27)
GFR est AA: 87 mL/min/{1.73_m2} (ref >59)
GFR est non-AA: 75 mL/min/{1.73_m2} (ref >59)
GLOBULIN, TOTAL: 2.4 g/dL (ref 1.5–4.5)
Glucose: 96 mg/dL (ref 65–99)
Potassium: 4.7 mmol/L (ref 3.5–5.2)
Protein, total: 6.7 g/dL (ref 6.0–8.5)
Sodium: 134 mmol/L (ref 134–144)

## 2012-08-02 LAB — TESTOSTERONE, TOTAL, ADULT MALE: Testosterone: 274 ng/dL — ABNORMAL LOW (ref 348–1197)

## 2012-08-02 LAB — PSA, DIAGNOSTIC (PROSTATE SPECIFIC AG): Prostate Specific Ag: 0.3 ng/mL (ref 0.0–4.0)

## 2012-08-03 LAB — TESTOSTERONE, FREE: Free testosterone (Direct): 5.5 pg/mL — ABNORMAL LOW (ref 7.2–24.0)

## 2012-08-03 NOTE — Progress Notes (Signed)
Quick Note:    Please call him and let him know that his PSA and CMP are WNL but his testosterone and Free Testosterone are not any better. Is he getting 200 mg of testosterone q 2 weeks IM? kkb  ______

## 2012-08-03 NOTE — Progress Notes (Signed)
HISTORY OF PRESENT ILLNESS  Steven [name] is a 56 y.o. male.  HPIThe patient is a 56 y.o. male who is seen for follow up of HTN and Hypogonadism.  Symptoms are stable.  The patient is following treatment regimen with good compliance, patient is about the same, no reported side effects of regimen.  He  is following an appropriate diet and is not exercising regularly.       Review of Systems   Constitutional: Negative for weight loss and malaise/fatigue.   Eyes: Negative for blurred vision.   Respiratory: Negative for cough, shortness of breath and wheezing.    Cardiovascular: Negative for chest pain, palpitations and leg swelling.   Gastrointestinal: Negative for nausea and abdominal pain.   Neurological: Negative for dizziness, sensory change, focal weakness and weakness.   Psychiatric/Behavioral: Negative for depression. The patient is nervous/anxious (He has not had any panic attacks since his last visit.). The patient does not have insomnia.    All other systems reviewed and are negative.      Blood pressure 138/78, pulse 58, temperature 97.6 ??F (36.4 ??C), temperature source Oral, height 5\' 11"  (1.803 m), weight 258 lb (117.028 kg), SpO2 99.00%.    Physical Exam   Nursing note and vitals reviewed.  Constitutional: He is oriented to person, place, and time. He appears well-developed and well-nourished. No distress.   HENT:   Head: Normocephalic and atraumatic.   Neck: Normal range of motion. Neck supple. No JVD present. No thyromegaly present.   Cardiovascular: Normal rate, regular rhythm and normal heart sounds.  Exam reveals no gallop and no friction rub.    No murmur heard.  Pulmonary/Chest: Effort normal and breath sounds normal.   Neurological: He is alert and oriented to person, place, and time.   Skin: Skin is warm and dry.   Psychiatric: He has a normal mood and affect. His behavior is normal. Judgment and thought content normal.       ASSESSMENT and PLAN  1. Other testicular hypofunction  INFLUENZA VIRUS  VACCINE, SPLIT, IN INDIVIDS. >=3 YRS OF AGE, IM, AMB POC COMPLETE CBC,AUTOMATED ENTER, METABOLIC PANEL, COMPREHENSIVE, TESTOSTERONE, TOTAL, ADULT MALE, TESTOSTERONE, FREE, PROSTATE SPECIFIC AG (PSA)   2. Essential hypertension, benign     3. Obesity, unspecified  phentermine (ADIPEX-P) 37.5 mg tablet   4. Need for prophylactic vaccination and inoculation against influenza     Labs rechecked. Continue current meds. He will RTO later today to get his next testosterone shot and his flu shot. He wanted to restart the phentermine to help him lose weight. His cardiologist told him to lose 20 lb. He will RTO in 3 months for a recheck and repeat fasting labs.

## 2012-08-04 LAB — AMB POC RAPID STREP A: Group A Strep Ag: NEGATIVE

## 2012-08-04 MED ORDER — TESTOSTERONE CYPIONATE 200 MG/ML IM OIL
200 mg/mL | Freq: Once | INTRAMUSCULAR | Status: AC
Start: 2012-08-04 — End: 2012-08-04

## 2012-08-04 MED ORDER — TRIMETHOPRIM-SULFAMETHOXAZOLE 160 MG-800 MG TAB
160-800 mg | ORAL_TABLET | Freq: Two times a day (BID) | ORAL | Status: DC
Start: 2012-08-04 — End: 2012-10-02

## 2012-08-04 MED ORDER — HYDROCODONE-HOMATROPINE 5 MG-1.5 MG/5 ML ORAL SOLUTION
Freq: Four times a day (QID) | ORAL | Status: DC | PRN
Start: 2012-08-04 — End: 2012-10-02

## 2012-08-04 NOTE — Progress Notes (Signed)
HISTORY OF PRESENT ILLNESS  Steven [name] is a 56 y.o. male.  Sore Throat   The history is provided by the patient. This is a new problem. The current episode started 2 days ago. The problem has been gradually worsening. There has been no fever. Associated symptoms include congestion, headaches, swollen glands and cough (tight, wheezy). Pertinent negatives include no diarrhea, no vomiting, no ear discharge, no ear pain, no plugged ear sensation, no shortness of breath, no stridor, no trouble swallowing and no stiff neck. He has had no exposure to strep or mono. Treatments tried: He started using his albuterol inhaler yesterday. The treatment provided mild relief.       Review of Systems   Constitutional: Positive for malaise/fatigue. Negative for fever and chills.   HENT: Positive for congestion and sore throat. Negative for ear pain, trouble swallowing and ear discharge.    Eyes: Negative for discharge and redness.   Respiratory: Positive for cough (tight, wheezy). Negative for sputum production, shortness of breath and stridor.    Cardiovascular: Negative for chest pain and leg swelling.   Gastrointestinal: Positive for nausea. Negative for vomiting and diarrhea.   Genitourinary: Negative for dysuria.   Musculoskeletal: Negative for myalgias.   Neurological: Positive for headaches.     Blood pressure 130/84, pulse 58, temperature 97.5 ??F (36.4 ??C), height 5\' 11"  (1.803 m), weight 259 lb (117.482 kg), SpO2 97.00%.    Physical Exam   Nursing note and vitals reviewed.  Constitutional: He appears well-developed and well-nourished. He appears ill. No distress.   HENT:   Head: Normocephalic and atraumatic.   Right Ear: Hearing, tympanic membrane, external ear and ear canal normal.   Left Ear: Hearing, tympanic membrane, external ear and ear canal normal.   Nose: Mucosal edema and rhinorrhea present. Right sinus exhibits no maxillary sinus tenderness and no frontal sinus tenderness. Left sinus exhibits no maxillary  sinus tenderness and no frontal sinus tenderness.   Mouth/Throat: Uvula is midline and mucous membranes are normal. Posterior oropharyngeal erythema present. No oropharyngeal exudate or posterior oropharyngeal edema.   Neck: Normal range of motion. Neck supple.   Cardiovascular: Normal rate, regular rhythm and normal heart sounds.    Pulmonary/Chest: Effort normal and breath sounds normal.        He has a frequent, tight, wheezy cough.   Lymphadenopathy:     He has cervical adenopathy.   Neurological: He is alert.   Skin: Skin is warm and dry. No rash noted.     Rapid Strep-negative  ASSESSMENT and PLAN  1. Acute pharyngitis  AMB POC RAPID STREP A, trimethoprim-sulfamethoxazole (BACTRIM DS) 160-800 mg per tablet   2. Acute bronchospasm  HYDROcodone-homatropine (HYCODAN) 5-1.5 mg/5 mL syrup   3. Other testicular hypofunction     4. Need for prophylactic vaccination and inoculation against influenza  INFLUENZA VIRUS VACCINE, SPLIT, IN INDIVIDS. >=3 YRS OF AGE, IM   5. Testicular hypofunction  PR THER/PROPH/DIAG INJECTION, SUBCUT/IM, testosterone cypionate (DEPOTESTOTERONE CYPIONATE) 200 mg/mL injection   He will rest, push fluids, take the meds as directed and RTO if no improvement. Flu shot given as the strep was negative and he has no fever. He was also given his testosterone shot today. He will RTO in 2 weeks for his next testosterone shot and in 3 months for a recheck with me and repeat labs.

## 2012-08-04 NOTE — Patient Instructions (Signed)
Hypogonadism: After Your Visit  Your Care Instructions  Men who have hypogonadism do not make enough testosterone. This hormone allows men to make sperm and to have normal physical male traits. The condition also is known as testosterone deficiency. It can lead to loss of sex drive, weakness, impotence, infertility, and weakened bones.  Many things can cause this condition. Causes include injured testicles, certain medicines, an infection, and aging. Having a long-term health problem such as kidney or liver disease or being obese can cause it. So can surgery or radiation treatment for another health problem. It also can be present at birth. It is most often treated with testosterone hormone. You can get the hormone as a shot or through a patch or gel on the skin.  Follow-up care is a key part of your treatment and safety. Be sure to make and go to all appointments, and call your doctor if you are having problems. It's also a good idea to know your test results and keep a list of the medicines you take.  How can you care for yourself at home?  ?? Take your medicines exactly as prescribed. Call your doctor if you think you are having a problem with your medicine. You will get more details on the specific medicines your doctor prescribes.   ?? Follow your treatment plan. If you use testosterone hormones, follow your doctor's instructions. Hormones can help relieve many of the effects of this condition, such as impotence. But it may take weeks or months for your symptoms to improve.   ?? Get plenty of exercise. And make sure to get plenty of calcium and vitamin D in your diet. Eat more dairy foods and green vegetables. They can help keep your bones from getting weak.   ?? If you have a hard time dealing with this condition, talk to your doctor about joining a support group. Talking with others who have the same problems can help you cope.   When should you call for help?  Call your doctor now or seek immediate medical  care if:  ?? You have headaches.   ?? You have problems with your vision.   Watch closely for changes in your health, and be sure to contact your doctor if:  ?? You have trouble getting or keeping an erection.   ?? You have a loss of body hair.   ?? You feel weak or tired a lot of the time.   ?? Your breasts are getting larger.   ?? You do not get better as expected.     Where can you learn more?    Go to http://www.healthwise.net/[facility]   Enter V434 in the search box to learn more about "Hypogonadism: After Your Visit."    ?? 2006-2013 [employer]. Care instructions adapted under license by [facility] (which disclaims liability or warranty for this information). This care instruction is for use with your licensed healthcare professional. If you have questions about a medical condition or this instruction, always ask your healthcare professional. Healthwise, Incorporated disclaims any warranty or liability for your use of this information.  Content Version: 9.7.130178; Last Revised: November 28, 2011

## 2012-08-19 MED ORDER — TESTOSTERONE CYPIONATE 200 MG/ML IM OIL
200 mg/mL | Freq: Once | INTRAMUSCULAR | Status: AC
Start: 2012-08-19 — End: 2012-08-19

## 2012-09-03 MED ORDER — TESTOSTERONE CYPIONATE 200 MG/ML IM OIL
200 mg/mL | Freq: Once | INTRAMUSCULAR | Status: AC
Start: 2012-09-03 — End: 2012-09-03

## 2012-09-11 MED ORDER — LISINOPRIL 40 MG TAB
40 mg | ORAL_TABLET | Freq: Every day | ORAL | Status: DC
Start: 2012-09-11 — End: 2013-02-25

## 2012-09-17 MED ORDER — TESTOSTERONE CYPIONATE 200 MG/ML IM OIL
200 mg/mL | Freq: Once | INTRAMUSCULAR | Status: AC
Start: 2012-09-17 — End: 2012-09-17

## 2012-09-19 MED ORDER — TESTOSTERONE CYPIONATE 200 MG/ML IM OIL
200 mg/mL | INTRAMUSCULAR | Status: DC
Start: 2012-09-19 — End: 2012-10-01

## 2012-09-19 NOTE — Telephone Encounter (Signed)
I sent in the order for him. kkb

## 2012-09-19 NOTE — Telephone Encounter (Signed)
Pt called requesting a refill on the testosterone 200mg . He would like to get this before his next injection.

## 2012-10-01 MED ORDER — TESTOSTERONE CYPIONATE 200 MG/ML IM OIL
200 mg/mL | INTRAMUSCULAR | Status: DC
Start: 2012-10-01 — End: 2013-02-25

## 2012-10-01 NOTE — Telephone Encounter (Signed)
Pt needs med refilled.

## 2012-10-01 NOTE — Telephone Encounter (Signed)
Called pt and he is aware he can pick up script.

## 2012-10-02 MED ORDER — PRAVASTATIN 40 MG TAB
40 mg | ORAL_TABLET | Freq: Every evening | ORAL | Status: DC
Start: 2012-10-02 — End: 2013-02-25

## 2012-10-02 MED ORDER — AMOXICILLIN CLAVULANATE 875 MG-125 MG TAB
875-125 mg | ORAL_TABLET | Freq: Two times a day (BID) | ORAL | Status: DC
Start: 2012-10-02 — End: 2013-02-25

## 2012-10-02 MED ORDER — TESTOSTERONE CYPIONATE 200 MG/ML IM OIL
200 mg/mL | Freq: Once | INTRAMUSCULAR | Status: DC
Start: 2012-10-02 — End: 2012-10-02

## 2012-10-02 NOTE — Progress Notes (Signed)
HISTORY OF PRESENT ILLNESS  Steven [name] is a 56 y.o. male.  Sinus Pain   The history is provided by the patient. This is a new problem. Episode onset: Symptoms started approximately 2 weeks ago. The problem has not changed since onset.There has been no fever. The pain is at a severity of 5/10. The pain is moderate. The pain has been constant since onset. Associated symptoms include congestion, ear pain, sinus pressure, sore throat, swollen glands, cough (that is productive of green sputum X 2 days), rhinorrhea and headaches. Pertinent negatives include no chills, no sweats, no hoarse voice, no shortness of breath, no neck pain, no neck pain and no chest pain. He has tried decongestants (He has been taking Sudafed 12 hr (120 mg) bid for the congestion.) for the symptoms. The treatment provided moderate relief.     The patient is a 56 y.o. male who is seen for follow up of Hyperlipidemia, HTN and Hypogonadism.  Symptoms are gradually improving.  The patient is having difficulty following treatment regimen, having some side effects as follows: Last week he stopped taking the zocor that his cardiologist started him on in 9/13 because it was causing him to have generalized myalgias. He states that the myalgias pretty much resolved within 2 days of stopping the Zocor. Marland Kitchen  He  is not following an appropriate diet and is not exercising regularly.         Review of Systems   Constitutional: Positive for malaise/fatigue. Negative for fever (initially but not now), chills and diaphoresis.   HENT: Positive for ear pain, congestion, sore throat, rhinorrhea and sinus pressure. Negative for hoarse voice and neck pain.    Eyes: Negative for blurred vision, discharge and redness.   Respiratory: Positive for cough (that is productive of green sputum X 2 days) and sputum production. Negative for shortness of breath and wheezing.    Cardiovascular: Negative for chest pain, orthopnea and leg swelling.   Gastrointestinal: Negative for  heartburn, nausea, vomiting, abdominal pain, diarrhea, constipation, blood in stool and melena.   Genitourinary: Negative for dysuria, urgency and frequency.   Musculoskeletal: Positive for myalgias (Improved since he has been on the Cymbalta.) and joint pain.   Neurological: Positive for headaches. Negative for dizziness, sensory change, focal weakness and weakness.   Psychiatric/Behavioral: The patient is nervous/anxious (Improved since he has been on the Cymbalta.).      Blood pressure 170/90, pulse 68, temperature 98 ??F (36.7 ??C), height 5\' 11"  (1.803 m), weight 265 lb (120.203 kg), SpO2 96.00%.  He has gained 7 lb since 08/01/12.    Physical Exam   Nursing note and vitals reviewed.  Constitutional: He is oriented to person, place, and time. He appears well-developed and well-nourished. No distress.   HENT:   Head: Normocephalic and atraumatic.   Right Ear: Hearing, tympanic membrane, external ear and ear canal normal.   Left Ear: Hearing, external ear and ear canal normal. Tympanic membrane is erythematous. Tympanic membrane is not bulging.  No middle ear effusion.   Nose: Mucosal edema (and edema) and rhinorrhea present. No sinus tenderness. Right sinus exhibits no maxillary sinus tenderness and no frontal sinus tenderness. Left sinus exhibits no maxillary sinus tenderness and no frontal sinus tenderness.   Mouth/Throat: Uvula is midline and mucous membranes are normal. Posterior oropharyngeal erythema present. No oropharyngeal exudate or posterior oropharyngeal edema.   Neck: Normal range of motion. Neck supple.   Cardiovascular: Normal rate, regular rhythm and normal heart sounds.  Pulmonary/Chest: Effort normal and breath sounds normal.   Musculoskeletal: Normal range of motion. He exhibits no edema and no tenderness.   Lymphadenopathy:     He has cervical adenopathy.   Neurological: He is alert and oriented to person, place, and time. Gait normal.   Skin: Skin is warm and dry. No rash noted. No pallor.    Psychiatric: He has a normal mood and affect. His behavior is normal. Judgment and thought content normal.       ASSESSMENT and PLAN  1. Acute upper respiratory infections of unspecified site  amoxicillin-clavulanate (AUGMENTIN) 875-125 mg per tablet   2. Pure hypercholesterolemia  pravastatin (PRAVACHOL) 40 mg tablet   3. Essential hypertension, benign     4. Testicular hypofunction  PR THER/PROPH/DIAG INJECTION, SUBCUT/IM, DISCONTINUED: testosterone cypionate (DEPOTESTOTERONE CYPIONATE) 200 mg/mL injection   His BP is very elevated today at 170/90. This is, most likely, because he has been taking OTC Sudafed 12 hr tabs (120 mg) for sinus congestion for the past 2 weeks. I told him to stop taking the sudafed and switch to OTC Coricidin and/or Afrin Nasal Spray for the congestion. I also recommended that he take OTC Zyrtec 10 mg qhs for the next month or so. He will push fluids and take the Augmentin as directed. I have changed the Zocor to Pravachol and told him that he must take it qhs and if he has side effects with it he is to call back. He was given Depotestosterone 200 mg IM today. He feels like the testosterone injections are helping him to feel better in general. He also stated that the Cymbalta is working well too even though he insists on only taking 30 mg qhs. He will RTO in 1/14 for a recheck and repeat labs or sooner prn.

## 2012-10-02 NOTE — Patient Instructions (Signed)
High Blood Pressure: After Your Visit  Your Care Instructions  If your blood pressure is usually above 140/90, you have high blood pressure, or hypertension. Despite what a lot of people think, high blood pressure usually doesn't cause headaches or make you feel dizzy or lightheaded. It usually has no symptoms. But it does increase your risk for heart attack, stroke, and kidney or eye damage. The higher your blood pressure, the more your risk increases.  Your doctor will give you a goal for your blood pressure. This goal may be below 140/90. Or it may be even lower if you have other health problems, such as diabetes, heart failure, or coronary artery disease.  Changes in your lifestyle, such as staying at a healthy weight, may help you lower your blood pressure. Your treatment also will include medicine. If you stop taking your medicine, your blood pressure will go back up.  Follow-up care is a key part of your treatment and safety. Be sure to make and go to all appointments, and call your doctor if you are having problems. It???s also a good idea to know your test results and keep a list of the medicines you take.  How can you care for yourself at home?  Medical treatment  ?? Take your medicine exactly as prescribed. You may take one or more types of medicine to lower your blood pressure. They include diuretics, beta-blockers, ACE inhibitors, calcium channel blockers, angiotensin II receptor blockers, and other medicines. Call your doctor if you think you are having a problem with your medicine.  ?? Your doctor may suggest that you take one low-dose aspirin (81 mg) a day. This can help reduce your risk of having a stroke or heart attack.  ?? See your doctor at least 2 times a year. You may need to see the doctor more often at first or until your blood pressure comes down.  ?? If you are taking blood pressure medicine, talk to your doctor before you take decongestants or anti-inflammatory medicine, such as ibuprofen.  Some of these medicines can raise blood pressure.  ?? Learn how to check your blood pressure at home.  Lifestyle changes  ?? Stay at a healthy weight. This is especially important if you put on weight around the waist. Losing even 10 pounds can help you lower your blood pressure.  ?? If your doctor recommends it, get more exercise. Walking is a good choice. Bit by bit, increase the amount you walk every day. Try for at least 30 minutes on most days of the week. You also may want to swim, bike, or do other activities.  ?? Avoid or limit alcohol. Talk to your doctor about whether you can drink any alcohol.  ?? Limit salt.  ?? Eat plenty of fruits (such as bananas and oranges), vegetables, legumes, whole grains, and low-fat dairy products.  ?? Lower the amount of saturated fat in your diet. Saturated fat is found in animal products such as milk, cheese, and meat. Limiting these foods may help you lose weight and also lower your risk for heart disease.  ?? Do not smoke. Smoking increases your risk for heart attack and stroke. If you need help quitting, talk to your doctor about stop-smoking programs and medicines. These can increase your chances of quitting for good.  When should you call for help?  Call your doctor now or seek immediate medical care if:  ?? Your blood pressure is much higher than normal (such as 180/110 or higher).  ??   You think high blood pressure is causing symptoms such as:  ?? Severe headache.  ?? Blurry vision.  ?? Nausea or vomiting.  Watch closely for changes in your health, and be sure to contact your doctor if:  ?? You do not get better as expected.    Where can you learn more?    Go to http://www.healthwise.net/[facility]   Enter X567 in the search box to learn more about "High Blood Pressure: After Your Visit."    ?? 2006-2013 [employer]. Care instructions adapted under license by [employer] (which disclaims liability or warranty for this information). This care instruction is for use with  your licensed healthcare professional. If you have questions about a medical condition or this instruction, always ask your healthcare professional. [employer], Inc[employer] disclaims any warranty or liability for your use of this information.  Content Version: 9.8.193578; Last Revised: December 18, 2011

## 2012-10-15 ENCOUNTER — Telehealth

## 2012-10-15 MED ORDER — AZITHROMYCIN 250 MG TAB
250 mg | ORAL_TABLET | ORAL | Status: DC
Start: 2012-10-15 — End: 2013-02-25

## 2012-10-15 MED ORDER — HYDROCODONE-HOMATROPINE 5 MG-1.5 MG/5 ML ORAL SOLUTION
ORAL | Status: DC | PRN
Start: 2012-10-15 — End: 2013-02-25

## 2012-10-15 NOTE — Telephone Encounter (Signed)
He stopped by also and he looks like he feels really badly. His throat is very erythematous and he has bilateral anterior cervical lymphadenopathy. resp even and unlabored. BP elevated at 160/104 but that may be because he is feeling so badly. He will start on the Zithromax, rest, push fluids and RTO in 2 days for a BP check.

## 2012-10-15 NOTE — Telephone Encounter (Signed)
Now he has a cough and needs a cough med. I printed an Rx for him and his wife picked it up for him. Steven [name]

## 2012-10-20 MED ORDER — METFORMIN 1,000 MG TAB
1000 mg | ORAL_TABLET | ORAL | Status: DC
Start: 2012-10-20 — End: 2013-01-22

## 2012-10-20 MED ORDER — METFORMIN 1,000 MG TAB
1000 mg | ORAL_TABLET | ORAL | Status: DC
Start: 2012-10-20 — End: 2012-10-20

## 2013-01-22 MED ORDER — METFORMIN 1,000 MG TAB
1000 mg | ORAL_TABLET | ORAL | Status: DC
Start: 2013-01-22 — End: 2013-02-23

## 2013-02-24 MED ORDER — METFORMIN 1,000 MG TAB
1000 mg | ORAL_TABLET | ORAL | Status: DC
Start: 2013-02-24 — End: 2013-02-25

## 2013-02-25 LAB — AMB POC URINALYSIS DIP STICK MANUAL W/ MICRO
Bilirubin (UA POC): NEGATIVE
Blood (UA POC): NEGATIVE
Glucose (UA POC): NEGATIVE
Ketones (UA POC): NEGATIVE
Leukocyte esterase (UA POC): NEGATIVE
Nitrites (UA POC): NEGATIVE
Specific gravity (UA POC): 1.01 (ref 1.001–1.035)
Urobilinogen (UA POC): 0.2 (ref 0.2–1)
pH (UA POC): 6 (ref 4.6–8.0)

## 2013-02-25 LAB — AMB POC COMPLETE CBC,AUTOMATED ENTER
ABS. GRANS (POC): 4.9 10*3/uL (ref 1.4–6.5)
ABS. LYMPHS (POC): 1.7 10*3/uL (ref 1.2–3.4)
ABS. MONOS (POC): 0.6 10*3/uL (ref 0.1–0.6)
GRANULOCYTES (POC): 68.6 % (ref 42.2–75.2)
HCT (POC): 54.5 % (ref 35–60)
HGB (POC): 18.2 g/dL — AB (ref 11–18)
LYMPHOCYTES (POC): 23.2 % (ref 20.5–51.1)
MCH (POC): 30.6 pg (ref 27–31)
MCHC (POC): 33.4 g/dL (ref 33–37)
MCV (POC): 91.7 fL (ref 80–99.9)
MONOCYTES (POC): 8.2 % (ref 1.7–9.3)
MPV (POC): 8.2 fL (ref 7.8–11)
PLATELET (POC): 149 10*3/uL — AB (ref 150–450)
RBC (POC): 5.94 M/uL (ref 4–6)
RDW (POC): 16 % — AB (ref 11.6–13.7)
WBC (POC): 7.2 10*3/uL (ref 4.5–10.5)

## 2013-02-25 MED ORDER — METFORMIN 1,000 MG TAB
1000 mg | ORAL_TABLET | ORAL | Status: DC
Start: 2013-02-25 — End: 2013-09-04

## 2013-02-25 MED ORDER — TESTOSTERONE CYPIONATE 200 MG/ML IM OIL
200 mg/mL | INTRAMUSCULAR | Status: DC
Start: 2013-02-25 — End: 2013-09-14

## 2013-02-25 MED ORDER — PHENTERMINE 37.5 MG TAB
37.5 mg | ORAL_TABLET | ORAL | Status: DC
Start: 2013-02-25 — End: 2013-09-14

## 2013-02-25 MED ORDER — NEBIVOLOL 20 MG TAB
20 mg | ORAL_TABLET | Freq: Every evening | ORAL | Status: DC
Start: 2013-02-25 — End: 2013-09-14

## 2013-02-25 MED ORDER — PRAVASTATIN 40 MG TAB
40 mg | ORAL_TABLET | Freq: Every evening | ORAL | Status: DC
Start: 2013-02-25 — End: 2013-04-29

## 2013-02-25 MED ORDER — DULOXETINE 30 MG CAP, DELAYED RELEASE
30 mg | ORAL_CAPSULE | Freq: Every day | ORAL | Status: DC
Start: 2013-02-25 — End: 2013-04-09

## 2013-02-25 MED ORDER — LISINOPRIL 40 MG TAB
40 mg | ORAL_TABLET | Freq: Every day | ORAL | Status: DC
Start: 2013-02-25 — End: 2013-09-09

## 2013-02-27 LAB — METABOLIC PANEL, COMPREHENSIVE
A-G Ratio: 1.8 (ref 1.1–2.5)
ALT (SGPT): 57 IU/L — ABNORMAL HIGH (ref 0–44)
AST (SGOT): 37 IU/L (ref 0–40)
Albumin: 4.4 g/dL (ref 3.5–5.5)
Alk. phosphatase: 67 IU/L (ref 39–117)
BUN/Creatinine ratio: 13 (ref 9–20)
BUN: 15 mg/dL (ref 6–24)
Bilirubin, total: 0.4 mg/dL (ref 0.0–1.2)
CO2: 22 mmol/L (ref 19–28)
Calcium: 8.8 mg/dL (ref 8.7–10.2)
Chloride: 101 mmol/L (ref 97–108)
Creatinine: 1.17 mg/dL (ref 0.76–1.27)
GFR est AA: 81 mL/min/{1.73_m2} (ref >59)
GFR est non-AA: 70 mL/min/{1.73_m2} (ref >59)
GLOBULIN, TOTAL: 2.4 g/dL (ref 1.5–4.5)
Glucose: 109 mg/dL — ABNORMAL HIGH (ref 65–99)
Potassium: 4.6 mmol/L (ref 3.5–5.2)
Protein, total: 6.8 g/dL (ref 6.0–8.5)
Sodium: 136 mmol/L (ref 134–144)

## 2013-02-27 LAB — MICROALBUMIN:CREATININE RATIO, RANDOM URINE
Creatinine, urine random: 191.9 mg/dL (ref 22.0–328.0)
Microalb/Creat ratio (ug/mg creat.): 5.3 mg/g creat (ref 0.0–30.0)
Microalbumin, urine: 10.2 ug/mL (ref 0.0–17.0)

## 2013-02-27 LAB — LIPID PANEL
Cholesterol, total: 149 mg/dL (ref 100–199)
HDL Cholesterol: 29 mg/dL — ABNORMAL LOW (ref >39)
LDL, calculated: 90 mg/dL (ref 0–99)
Triglyceride: 148 mg/dL (ref 0–149)
VLDL, calculated: 30 mg/dL (ref 5–40)

## 2013-02-27 LAB — TESTOSTERONE, FREE: Free testosterone (Direct): 9.4 pg/mL (ref 7.2–24.0)

## 2013-02-27 LAB — TESTOSTERONE, TOTAL, ADULT MALE: Testosterone: 502 ng/dL (ref 348–1197)

## 2013-02-27 LAB — TSH 3RD GENERATION: TSH: 2.06 u[IU]/mL (ref 0.450–4.500)

## 2013-02-27 LAB — PSA, DIAGNOSTIC (PROSTATE SPECIFIC AG): Prostate Specific Ag: 0.4 ng/mL (ref 0.0–4.0)

## 2013-02-27 LAB — HEMOGLOBIN A1C WITH EAG: Hemoglobin A1c: 5.9 % — ABNORMAL HIGH (ref 4.8–5.6)

## 2013-03-01 NOTE — Progress Notes (Signed)
HISTORY OF PRESENT ILLNESS  Steven [name] is a 57 y.o. male.  HPI  The patient is a 57 y.o. male who is seen for follow up of Hyperlipidemia, HTN, Pre-Diabetes, Testicular Hypofunction and Anxiety.  Symptoms are stable and well controlled.  The patient is following treatment regimen with good compliance, patient is about the same, no reported side effects of regimen.  He  is not following an appropriate diet and is not exercising regularly.          Review of Systems   Constitutional: Positive for malaise/fatigue. Negative for weight loss.   HENT: Negative for congestion.    Eyes: Negative for blurred vision.   Respiratory: Negative for cough and shortness of breath.    Cardiovascular: Negative for chest pain, palpitations and leg swelling.   Gastrointestinal: Negative for nausea and abdominal pain.   Genitourinary: Negative for dysuria, urgency and frequency.   Skin: Negative for itching and rash.   Neurological: Negative for dizziness, sensory change, focal weakness, weakness and headaches.   Endo/Heme/Allergies: Negative for polydipsia.   Psychiatric/Behavioral: Negative for depression. The patient is nervous/anxious (He has not had any panic attacks since his last visit.). The patient does not have insomnia.    All other systems reviewed and are negative.      Blood pressure 146/88, pulse 70, temperature 97.6 ??F (36.4 ??C), temperature source Oral, height 5\' 11"  (1.803 m), weight 272 lb 3.2 oz (123.469 kg), SpO2 96.00%.  He has gained 13 lb since 10/13.  Physical Exam   Nursing note and vitals reviewed.  Constitutional: He is oriented to person, place, and time. He appears well-developed and well-nourished. He does not appear ill. No distress.   HENT:   Head: Normocephalic and atraumatic.   Neck: Normal range of motion. Neck supple. No JVD present. No thyromegaly present.   Cardiovascular: Normal rate, regular rhythm and normal heart sounds.  Exam reveals no gallop and no friction rub.    No murmur heard.  No  peripheral edema noted.   Pulmonary/Chest: Effort normal and breath sounds normal.   Musculoskeletal: Normal range of motion.   Neurological: He is alert and oriented to person, place, and time. He has normal strength. No cranial nerve deficit or sensory deficit. Gait normal.   Skin: Skin is warm and dry.   Psychiatric: He has a normal mood and affect. His behavior is normal. Judgment and thought content normal.       ASSESSMENT and PLAN    ICD-9-CM    1. Pure hypercholesterolemia 272.0 pravastatin (PRAVACHOL) 40 mg tablet     METABOLIC PANEL, COMPREHENSIVE     LIPID PANEL   2. Other testicular hypofunction 257.2 testosterone cypionate (DEPOTESTOTERONE CYPIONATE) 200 mg/mL injection     TSH, 3RD GENERATION     TESTOSTERONE, FREE     PROSTATE SPECIFIC AG (PSA)     TESTOSTERONE, TOTAL, ADULT MALE   3. Pre-diabetes 790.29 metFORMIN (GLUCOPHAGE) 1,000 mg tablet     HEMOGLOBIN A1C     MICROALBUMIN:CREATININE RATIO, RANDOM URINE      CANCELED: MICROALBUMIN:CREATININE RATIO, RANDOM URINE   4. Essential hypertension, benign 401.1 lisinopril (PRINIVIL, ZESTRIL) 40 mg tablet     nebivolol (BYSTOLIC) 20 mg tablet     AMB POC COMPLETE CBC,AUTOMATED ENTER     AMB POC URINALYSIS DIP STICK MANUAL W/ MICRO   5. Generalized anxiety disorder 300.02 DULoxetine (CYMBALTA) 30 mg capsule   6. Obesity, unspecified 278.00 phentermine (ADIPEX-P) 37.5 mg tablet   Labs  checked and meds refilled as he is feeling fairly well on his current regimen. He was encouraged to STRICTLY follow a low cholesterol, low glycemic index diet and to exercise regularly. He will RTO in 6 months for a recheck and repeat fasting labs.

## 2013-04-09 ENCOUNTER — Encounter

## 2013-04-10 MED ORDER — DULOXETINE 30 MG CAP, DELAYED RELEASE
30 mg | ORAL_CAPSULE | Freq: Every day | ORAL | Status: DC
Start: 2013-04-10 — End: 2013-09-14

## 2013-04-29 MED ORDER — PRAVASTATIN 40 MG TAB
40 mg | ORAL_TABLET | ORAL | Status: DC
Start: 2013-04-29 — End: 2013-08-28

## 2013-08-28 MED ORDER — PRAVASTATIN 40 MG TAB
40 mg | ORAL_TABLET | ORAL | Status: DC
Start: 2013-08-28 — End: 2013-09-14

## 2013-09-03 ENCOUNTER — Encounter

## 2013-09-04 MED ORDER — METFORMIN 1,000 MG TAB
1000 mg | ORAL_TABLET | ORAL | Status: DC
Start: 2013-09-04 — End: 2013-09-14

## 2013-09-04 NOTE — Telephone Encounter (Signed)
He will make an appt for a recheck and fasting labs soon. kkb

## 2013-09-09 ENCOUNTER — Encounter

## 2013-09-09 MED ORDER — LISINOPRIL 40 MG TAB
40 mg | ORAL_TABLET | Freq: Every day | ORAL | Status: DC
Start: 2013-09-09 — End: 2013-09-14

## 2013-09-09 NOTE — Telephone Encounter (Signed)
He has an appt on Monday.

## 2013-09-14 LAB — AMB POC URINALYSIS DIP STICK MANUAL W/ MICRO
Bilirubin (UA POC): NEGATIVE
Blood (UA POC): NEGATIVE
Glucose (UA POC): NEGATIVE
Ketones (UA POC): NEGATIVE
Leukocyte esterase (UA POC): NEGATIVE
Nitrites (UA POC): NEGATIVE
Protein (UA POC): NEGATIVE mg/dL
Specific gravity (UA POC): 1.01 (ref 1.001–1.035)
Urobilinogen (UA POC): 0.2 (ref 0.2–1)
pH (UA POC): 6.5 (ref 4.6–8.0)

## 2013-09-14 MED ORDER — NEBIVOLOL 20 MG TAB
20 mg | ORAL_TABLET | Freq: Every evening | ORAL | Status: DC
Start: 2013-09-14 — End: 2014-04-21

## 2013-09-14 MED ORDER — METFORMIN 1,000 MG TAB
1000 mg | ORAL_TABLET | ORAL | Status: DC
Start: 2013-09-14 — End: 2014-04-15

## 2013-09-14 MED ORDER — PRAVASTATIN 40 MG TAB
40 mg | ORAL_TABLET | ORAL | Status: DC
Start: 2013-09-14 — End: 2014-05-24

## 2013-09-14 MED ORDER — LISINOPRIL 40 MG TAB
40 mg | ORAL_TABLET | Freq: Every day | ORAL | Status: DC
Start: 2013-09-14 — End: 2014-04-15

## 2013-09-14 MED ORDER — DULOXETINE 60 MG CAP, DELAYED RELEASE
60 mg | ORAL_CAPSULE | Freq: Every day | ORAL | Status: DC
Start: 2013-09-14 — End: 2014-04-29

## 2013-09-14 MED ORDER — TESTOSTERONE CYPIONATE 200 MG/ML IM OIL
200 mg/mL | INTRAMUSCULAR | Status: DC
Start: 2013-09-14 — End: 2014-04-29

## 2013-09-14 MED ORDER — PHENTERMINE 37.5 MG TAB
37.5 mg | ORAL_TABLET | ORAL | Status: DC
Start: 2013-09-14 — End: 2014-04-21

## 2013-09-15 LAB — AMB POC COMPLETE CBC,AUTOMATED ENTER
ABS. GRANS (POC): 5.1 10*3/uL (ref 1.4–6.5)
ABS. LYMPHS (POC): 2.3 10*3/uL (ref 1.2–3.4)
ABS. MONOS (POC): 0.1 10*3/uL (ref 0.1–0.6)
GRANULOCYTES (POC): 67.8 % (ref 42.2–75.2)
HCT (POC): 56.9 % (ref 35–60)
HGB (POC): 18.4 g/dL — AB (ref 11–18)
LYMPHOCYTES (POC): 30.7 % (ref 20.5–51.1)
MCH (POC): 29.7 pg (ref 27–31)
MCHC (POC): 32.4 g/dL — AB (ref 33–37)
MCV (POC): 91.7 fL (ref 80–99.9)
MONOCYTES (POC): 1.5 % — AB (ref 1.7–9.3)
MPV (POC): 8.2 fL (ref 7.8–11)
PLATELET (POC): 197 10*3/uL (ref 150–450)
RBC (POC): 6.21 M/uL — AB (ref 4–6)
RDW (POC): 14.5 % — AB (ref 11.6–13.7)
WBC (POC): 7.5 10*3/uL (ref 4.5–10.5)

## 2013-09-15 NOTE — Progress Notes (Signed)
HISTORY OF PRESENT ILLNESS  Steven [name] is a 57 y.o. male.  HPI  The patient is a 57 y.o. male who is seen for follow up of Hyperlipidemia, HTN, Pre-Diabetes, Hypogonadism, Anxiety and Obesity.  Symptoms are symptoms have progressed to a point and plateaued..  The patient is following treatment regimen with good compliance, patient has not improved significantly, no reported side effects of regimen.  He  is not following an appropriate diet and is not exercising regularly.          Review of Systems   Constitutional: Positive for malaise/fatigue. Negative for weight loss.   HENT: Negative for congestion.    Eyes: Negative for blurred vision.   Respiratory: Negative for cough and shortness of breath.    Cardiovascular: Negative for chest pain, palpitations and leg swelling.   Gastrointestinal: Negative for heartburn, nausea, vomiting, abdominal pain, diarrhea, constipation, blood in stool and melena.   Genitourinary: Negative for dysuria, urgency and frequency.   Musculoskeletal: Positive for back pain (Chronic low back pain that radiates down his LLE).   Skin: Negative for itching and rash.   Neurological: Negative for dizziness, sensory change, focal weakness, weakness and headaches.   Endo/Heme/Allergies: Negative for polydipsia.   Psychiatric/Behavioral: Negative for depression, suicidal ideas and substance abuse. The patient is nervous/anxious (He has not had any panic attacks since his last visit.). The patient does not have insomnia.    All other systems reviewed and are negative.      Blood pressure 150/80, pulse 74, temperature 98.4 ??F (36.9 ??C), height 5\' 11"  (1.803 m), weight 270 lb (122.471 kg), SpO2 95.00%.  He has gained 5 lb since 12/13. He believes that his BP is elevated today due to "white coat hypertension."  Physical Exam   Nursing note and vitals reviewed.  Constitutional: He is oriented to person, place, and time. He appears well-developed and well-nourished. He does not appear ill (but he  looks exhausted). No distress.   HENT:   Head: Normocephalic and atraumatic.   Eyes: Conjunctivae and EOM are normal. Pupils are equal, round, and reactive to light. No scleral icterus.   Neck: Normal range of motion. Neck supple. No JVD present. No thyromegaly present.   Cardiovascular: Normal rate, regular rhythm, normal heart sounds and intact distal pulses.    No peripheral edema noted.   Pulmonary/Chest: Effort normal and breath sounds normal.   Abdominal: Soft. Normal appearance and bowel sounds are normal. There is no hepatosplenomegaly. There is no tenderness. There is no CVA tenderness.   Musculoskeletal:        Lumbar back: He exhibits decreased range of motion, tenderness, bony tenderness (Left S-I joint) and pain (primarily in his left low back and it radiates down his LLE). He exhibits no edema, no deformity, no spasm and normal pulse.   Neurological: He is alert and oriented to person, place, and time. He has normal strength. No cranial nerve deficit or sensory deficit. Gait (he ambulates slowly leaning forward with an antalgic gait d/t his chronic low back pain that radiates down his LLE ) abnormal.   Skin: Skin is warm and dry. No rash noted.   Psychiatric: His speech is normal and behavior is normal. Judgment and thought content normal. His mood appears anxious. His affect is not angry, not blunt, not labile and not inappropriate. Cognition and memory are normal. He does not exhibit a depressed mood.       ASSESSMENT and PLAN    ICD-9-CM  1. Pure hypercholesterolemia 272.0 METABOLIC PANEL, COMPREHENSIVE     LIPID PANEL     VITAMIN D, 25 HYDROXY     pravastatin (PRAVACHOL) 40 mg tablet   2. Pre-diabetes 790.29 MICROALBUMIN:CREATININE RATIO, RANDOM URINE     HEMOGLOBIN A1C     metFORMIN (GLUCOPHAGE) 1,000 mg tablet   3. Essential hypertension, benign 401.1 AMB POC COMPLETE CBC,AUTOMATED ENTER     AMB POC URINALYSIS DIP STICK MANUAL W/ MICRO     TSH, 3RD GENERATION     lisinopril (PRINIVIL, ZESTRIL)  40 mg tablet     nebivolol (BYSTOLIC) 20 mg tablet   4. Other testicular hypofunction 257.2 TESTOSTERONE, FREE     PROSTATE SPECIFIC AG (PSA)     TESTOSTERONE, TOTAL, ADULT MALE     testosterone cypionate (DEPOTESTOTERONE CYPIONATE) 200 mg/mL injection   5. Generalized anxiety disorder 300.02 DULoxetine (CYMBALTA) 60 mg capsule   6. Obesity, unspecified 278.00 phentermine (ADIPEX-P) 37.5 mg tablet   7. 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 He has had chronic low back pain that radiates down his LLE for years. The pain has gotten so bad that he actually ambulates stooped over. He has never gotten any treatment for his symptoms or had x-rays or an MRI. Fredia Beets ordered a consult with Dr. Daphine Deutscher at [facility] for f/u care of his back pain. He was encouraged to strictly follow a low cholesterol, low glycemic index diet and to try exercise regularly. He will RTO in 6 months for a recheck and repeat fasting labs.

## 2013-09-16 LAB — VITAMIN D, 25 HYDROXY: VITAMIN D, 25-HYDROXY: 19.1 ng/mL — ABNORMAL LOW (ref 30.0–100.0)

## 2013-09-16 LAB — MICROALBUMIN:CREATININE RATIO, RANDOM URINE
Creatinine, urine random: 156.7 mg/dL (ref 22.0–328.0)
Microalb/Creat ratio (ug/mg creat.): 5.9 mg/g creat (ref 0.0–30.0)
Microalbumin, urine: 9.3 ug/mL (ref 0.0–17.0)

## 2013-09-16 LAB — METABOLIC PANEL, COMPREHENSIVE
A-G Ratio: 1.7 (ref 1.1–2.5)
ALT (SGPT): 57 IU/L — ABNORMAL HIGH (ref 0–44)
AST (SGOT): 46 IU/L — ABNORMAL HIGH (ref 0–40)
Albumin: 4.2 g/dL (ref 3.5–5.5)
Alk. phosphatase: 83 IU/L (ref 39–117)
BUN/Creatinine ratio: 18 (ref 9–20)
BUN: 20 mg/dL (ref 6–24)
Bilirubin, total: 0.3 mg/dL (ref 0.0–1.2)
CO2: 22 mmol/L (ref 18–29)
Calcium: 9.6 mg/dL (ref 8.7–10.2)
Chloride: 99 mmol/L (ref 97–108)
Creatinine: 1.1 mg/dL (ref 0.76–1.27)
GFR est AA: 86 mL/min/{1.73_m2} (ref >59)
GFR est non-AA: 75 mL/min/{1.73_m2} (ref >59)
GLOBULIN, TOTAL: 2.5 g/dL (ref 1.5–4.5)
Glucose: 93 mg/dL (ref 65–99)
Potassium: 4.8 mmol/L (ref 3.5–5.2)
Protein, total: 6.7 g/dL (ref 6.0–8.5)
Sodium: 138 mmol/L (ref 134–144)

## 2013-09-16 LAB — TESTOSTERONE, TOTAL, ADULT MALE: Testosterone: 76 ng/dL — ABNORMAL LOW (ref 348–1197)

## 2013-09-16 LAB — LIPID PANEL
Cholesterol, total: 205 mg/dL — ABNORMAL HIGH (ref 100–199)
HDL Cholesterol: 31 mg/dL — ABNORMAL LOW (ref >39)
LDL, calculated: 100 mg/dL — ABNORMAL HIGH (ref 0–99)
Triglyceride: 371 mg/dL — ABNORMAL HIGH (ref 0–149)
VLDL, calculated: 74 mg/dL — ABNORMAL HIGH (ref 5–40)

## 2013-09-16 LAB — TESTOSTERONE, FREE: Free testosterone (Direct): 1.8 pg/mL — ABNORMAL LOW (ref 7.2–24.0)

## 2013-09-16 LAB — TSH 3RD GENERATION: TSH: 1.82 u[IU]/mL (ref 0.450–4.500)

## 2013-09-16 LAB — HEMOGLOBIN A1C WITH EAG: Hemoglobin A1c: 6.1 % — ABNORMAL HIGH (ref 4.8–5.6)

## 2013-09-16 LAB — PSA, DIAGNOSTIC (PROSTATE SPECIFIC AG): Prostate Specific Ag: 0.3 ng/mL (ref 0.0–4.0)

## 2013-09-22 MED ORDER — ERGOCALCIFEROL (VITAMIN D2) 50,000 UNIT CAP
1250 mcg (50,000 unit) | ORAL_CAPSULE | ORAL | Status: DC
Start: 2013-09-22 — End: 2013-12-11

## 2013-09-22 NOTE — Progress Notes (Signed)
Vit D level 19.1 so I have started him on ergocalciferol. kkb

## 2013-09-22 NOTE — Addendum Note (Signed)
Addended by: Delman Kitten on: 09/22/2013 07:54 AM     Modules accepted: Orders

## 2013-10-08 MED ORDER — METFORMIN 1,000 MG TAB
1000 mg | ORAL_TABLET | ORAL | Status: DC
Start: 2013-10-08 — End: 2014-04-15

## 2013-12-11 ENCOUNTER — Encounter

## 2013-12-11 MED ORDER — ERGOCALCIFEROL (VITAMIN D2) 50,000 UNIT CAP
1250 mcg (50,000 unit) | ORAL_CAPSULE | ORAL | Status: DC
Start: 2013-12-11 — End: 2014-08-19

## 2014-04-13 ENCOUNTER — Encounter

## 2014-04-15 MED ORDER — METFORMIN 1,000 MG TAB
1000 mg | ORAL_TABLET | ORAL | Status: DC
Start: 2014-04-15 — End: 2014-05-16

## 2014-04-15 MED ORDER — LISINOPRIL 40 MG TAB
40 mg | ORAL_TABLET | Freq: Every day | ORAL | Status: DC
Start: 2014-04-15 — End: 2014-05-16

## 2014-04-21 LAB — AMB POC URINALYSIS DIP STICK MANUAL W/ MICRO
Bilirubin (UA POC): NEGATIVE
Blood (UA POC): NEGATIVE
Glucose (UA POC): NEGATIVE
Ketones (UA POC): NEGATIVE
Leukocyte esterase (UA POC): NEGATIVE
Nitrites (UA POC): NEGATIVE
Protein (UA POC): NEGATIVE mg/dL
Specific gravity (UA POC): 1.015 (ref 1.001–1.035)
Urobilinogen (UA POC): 0.2 (ref 0.2–1)
pH (UA POC): 5 (ref 4.6–8.0)

## 2014-04-21 LAB — AMB POC COMPLETE CBC,AUTOMATED ENTER
ABS. GRANS (POC): 4.8 10*3/uL (ref 1.4–6.5)
ABS. LYMPHS (POC): 3.7 10*3/uL (ref 1.2–3.4)
ABS. MONOS (POC): 0.5 10*3/uL (ref 0.1–0.6)
GRANULOCYTES (POC): 53.5 % (ref 42.2–75.2)
HCT (POC): 60.9 % (ref 35–60)
HGB (POC): 19.5 g/dL (ref 11–18)
LYMPHOCYTES (POC): 41.1 % (ref 20.5–51.1)
MCH (POC): 29.7 pg (ref 27–31)
MCHC (POC): 32.1 g/dL (ref 33–37)
MCV (POC): 92.5 fL (ref 80–99.9)
MONOCYTES (POC): 5.1 % (ref 1.7–9.3)
MPV (POC): 7.9 fL (ref 7.8–11)
PLATELET (POC): 183 10*3/uL (ref 150–450)
RBC (POC): 6.58 M/uL (ref 4–6)
RDW (POC): 15.6 % (ref 11.6–13.7)
WBC (POC): 9 10*3/uL (ref 4.5–10.5)

## 2014-04-21 MED ORDER — PHENTERMINE 37.5 MG TAB
37.5 mg | ORAL_TABLET | ORAL | Status: DC
Start: 2014-04-21 — End: 2014-08-23

## 2014-04-22 LAB — HEPATITIS C ANTIBODY: HCV Ab: <0.1 s/co ratio (ref 0.0–0.9)

## 2014-04-22 LAB — HEPATITIS C AB: HEP C VIRUS AB: <0.1 s/co ratio (ref 0.0–0.9)

## 2014-04-23 LAB — METABOLIC PANEL, COMPREHENSIVE
A-G Ratio: 1.9 (ref 1.1–2.5)
ALT (SGPT): 45 IU/L — ABNORMAL HIGH (ref 0–44)
AST (SGOT): 37 IU/L (ref 0–40)
Albumin: 4.7 g/dL (ref 3.5–5.5)
Alk. phosphatase: 72 IU/L (ref 39–117)
BUN/Creatinine ratio: 16 (ref 9–20)
BUN: 20 mg/dL (ref 6–24)
Bilirubin, total: 0.5 mg/dL (ref 0.0–1.2)
CO2: 21 mmol/L (ref 18–29)
Calcium: 10.1 mg/dL (ref 8.7–10.2)
Chloride: 96 mmol/L — ABNORMAL LOW (ref 97–108)
Creatinine: 1.29 mg/dL — ABNORMAL HIGH (ref 0.76–1.27)
GFR est AA: 71 mL/min/{1.73_m2} (ref >59)
GFR est non-AA: 62 mL/min/{1.73_m2} (ref >59)
GLOBULIN, TOTAL: 2.5 g/dL (ref 1.5–4.5)
Glucose: 106 mg/dL — ABNORMAL HIGH (ref 65–99)
Potassium: 4.9 mmol/L (ref 3.5–5.2)
Protein, total: 7.2 g/dL (ref 6.0–8.5)
Sodium: 137 mmol/L (ref 134–144)

## 2014-04-23 LAB — HEMOGLOBIN A1C WITH EAG: Hemoglobin A1c: 6.2 % — ABNORMAL HIGH (ref 4.8–5.6)

## 2014-04-23 LAB — MICROALBUMIN:CREATININE RATIO, RANDOM URINE
Creatinine, urine random: 152 mg/dL (ref 22.0–328.0)
Microalb/Creat ratio (ug/mg creat.): 17.9 mg/g creat (ref 0.0–30.0)
Microalbumin, urine: 27.2 ug/mL — ABNORMAL HIGH (ref 0.0–17.0)

## 2014-04-23 LAB — TESTOSTERONE, TOTAL, ADULT MALE: Testosterone: 310 ng/dL — ABNORMAL LOW (ref 348–1197)

## 2014-04-23 LAB — LIPID PANEL
Cholesterol, total: 186 mg/dL (ref 100–199)
HDL Cholesterol: 31 mg/dL — ABNORMAL LOW (ref >39)
LDL, calculated: 104 mg/dL — ABNORMAL HIGH (ref 0–99)
Triglyceride: 253 mg/dL — ABNORMAL HIGH (ref 0–149)
VLDL, calculated: 51 mg/dL — ABNORMAL HIGH (ref 5–40)

## 2014-04-23 LAB — PSA, DIAGNOSTIC (PROSTATE SPECIFIC AG): Prostate Specific Ag: 0.3 ng/mL (ref 0.0–4.0)

## 2014-04-23 LAB — VITAMIN D, 25 HYDROXY: VITAMIN D, 25-HYDROXY: 34.8 ng/mL (ref 30.0–100.0)

## 2014-04-23 LAB — TSH 3RD GENERATION: TSH: 2.32 u[IU]/mL (ref 0.450–4.500)

## 2014-04-23 LAB — TESTOSTERONE, FREE: Free testosterone (Direct): 11.4 pg/mL (ref 7.2–24.0)

## 2014-04-25 NOTE — Patient Instructions (Signed)
Fatigue: After Your Visit  Your Care Instructions  Fatigue is a feeling of tiredness, exhaustion, or lack of energy. You may feel fatigue because of too much or not enough activity. It can also come from stress, lack of sleep, boredom, and poor diet. Many medical problems, such as viral infections, can cause fatigue. Emotional problems, especially depression, are often the cause of fatigue.  Fatigue is most often a symptom of another problem. Treatment for fatigue depends on the cause. For example, if you have fatigue because you have a certain health problem, treating this problem also treats your fatigue. If depression or anxiety is the cause, treatment may help.  Follow-up care is a key part of your treatment and safety. Be sure to make and go to all appointments, and call your doctor if you are having problems. It's also a good idea to know your test results and keep a list of the medicines you take.  How can you care for yourself at home?  ?? Get regular exercise. But don't overdo it. Go back and forth between rest and exercise.  ?? Get plenty of rest.  ?? Eat a healthy diet. Do not skip meals, especially breakfast.  ?? Reduce your use of caffeine, tobacco, and alcohol. Caffeine is most often found in coffee, tea, cola drinks, and chocolate.  ?? Limit medicines that can cause fatigue. This includes tranquilizers and cold and allergy medicines.  When should you call for help?  Watch closely for changes in your health, and be sure to contact your doctor if:  ?? You have new symptoms such as fever or a rash.  ?? Your fatigue gets worse.  ?? You have been feeling down, depressed, or hopeless. Or you may have lost interest in things that you usually enjoy.  ?? You are not getting better as expected.   Where can you learn more?   Go to [email or web address]  Enter [phone] in the search box to learn more about "Fatigue: After Your Visit."   ?? 2006-2015 [employer]. Care instructions adapted under  license by [employer] (which disclaims liability or warranty for this information). This care instruction is for use with your licensed healthcare professional. If you have questions about a medical condition or this instruction, always ask your healthcare professional. [employer], Incorporated disclaims any warranty or liability for your use of this information.  Content Version: 10.5.422740; Current as of: September 11, 2013              Learning About High Cholesterol  What is high cholesterol?  Cholesterol is a type of fat in your blood. It is needed for many body functions, such as making new cells. Cholesterol is made by your body. It also comes from food you eat.  If you have too much cholesterol, it starts to build up in your arteries. This is called hardening of the arteries, or atherosclerosis. High cholesterol raises your risk of a heart attack and stroke.  There are different types of cholesterol. LDL is the "bad" cholesterol. High LDL can raise your risk for heart disease, heart attack, and stroke. HDL is the "good" cholesterol. High HDL is linked with a lower risk for heart disease, heart attack, and stroke.  Your cholesterol levels help your doctor find out your risk for having a heart attack or stroke.  How can you prevent high cholesterol?  A heart-healthy lifestyle can help you prevent high cholesterol. This lifestyle helps lower your risk for a heart attack and stroke.  ??  Eat heart-healthy foods.  ?? Eat fruits, vegetables, whole grains (like oatmeal), dried beans and peas, nuts and seeds, soy products (like tofu), and fat-free or low-fat dairy products.  ?? Replace butter, margarine, and hydrogenated or partially hydrogenated oils with olive and canola oils. (Canola oil margarine without trans fat is fine.)  ?? Replace red meat with fish, poultry, and soy protein (like tofu).  ?? Limit processed and packaged foods like chips, crackers, and cookies.   ?? Be active. Exercise can improve your cholesterol level. Get at least 30 minutes of exercise on most days of the week. Walking is a good choice. You also may want to do other activities, such as running, swimming, cycling, or playing tennis or team sports.  ?? Stay at a healthy weight. Lose weight if you need to.  ?? Don't smoke. If you need help quitting, talk to your doctor about stop-smoking programs and medicines. These can increase your chances of quitting for good.  How is high cholesterol treated?  The goal of treatment is to reduce your chances of having a heart attack or stroke. The goal is not to lower your cholesterol numbers only.  ?? You may make lifestyle changes, such as eating healthy foods, not smoking, losing weight, and being more active.  ?? You may have to take medicine.  Follow-up care is a key part of your treatment and safety. Be sure to make and go to all appointments, and call your doctor if you are having problems. It's also a good idea to know your test results and keep a list of the medicines you take.   Where can you learn more?   Go to [email or web address]  Enter Q621 in the search box to learn more about "Learning About High Cholesterol."   ?? 2006-2015 Healthwise, Inc[employer]. Care instructions adapted under license by [employer] [place] (which disclaims liability or warranty for this information). This care instruction is for use with your licensed healthcare professional. If you have questions about a medical condition or this instruction, always ask your healthcare professional. Health[employer], Incorporated disclaims any warranty or liability for your use of this information.  Content Version: 10.5.422740; Current as of: December 18, 2013              Learning About High Blood Pressure  What is high blood pressure?     Blood pressure is a measure of how hard the blood pushes against the walls of your arteries. It's normal for blood pressure to go up and down  throughout the day, but if it stays up, you have high blood pressure. Another name for high blood pressure is hypertension.  Two numbers tell you your blood pressure. The first number is the systolic pressure. It shows how hard the blood pushes when your heart is pumping. The second number is the diastolic pressure. It shows how hard the blood pushes between heartbeats, when your heart is relaxed and filling with blood.  A blood pressure of less than 120/80 (say "120 over 80") is ideal for an adult. High blood pressure is 140/90 or higher. Many people fall into the category in between, called prehypertension. People with prehypertension need to make lifestyle changes to bring their blood pressure down and help prevent or delay high blood pressure.  What happens when you have high blood pressure?  ?? Blood flows through your arteries with too much force. Over time, this damages the walls of your arteries. But you can't feel it. High blood pressure usually doesn't cause  symptoms.  ?? Fat and calcium start to build up in your arteries. This buildup is called plaque. Plaque makes your arteries narrower and stiffer. Blood can't flow through them as easily.  ?? This lack of good blood flow starts to damage some of the organs in your body. This can lead to problems such as coronary artery disease and heart attack, heart failure, stroke, kidney failure, and eye damage.  How can you prevent high blood pressure?  ?? Stay at a healthy weight.  ?? Try to limit how much sodium you eat to less than 2,300 milligrams (mg) a day. And try to limit the sodium you eat to less than 1,500 mg a day if you are 51 or older, are black, or have high blood pressure, diabetes, or chronic kidney disease.  ?? Buy foods that are labeled "unsalted," "sodium-free," or "low-sodium." Foods labeled "reduced-sodium" and "light sodium" may still have too much sodium.  ?? Flavor your food with garlic, lemon juice, onion, vinegar, herbs, and  spices instead of salt. Do not use soy sauce, steak sauce, onion salt, garlic salt, mustard, or ketchup on your food.  ?? Use less salt (or none) when recipes call for it. You can often use half the salt a recipe calls for without losing flavor.  ?? Be physically active. Get at least 30 minutes of exercise on most days of the week. Walking is a good choice. You also may want to do other activities, such as running, swimming, cycling, or playing tennis or team sports.  ?? Limit alcohol to 2 drinks a day for men and 1 drink a day for women.  ?? Eat plenty of fruits, vegetables, and low-fat dairy products. Eat less saturated and total fats.  How is high blood pressure treated?  ?? Your doctor will suggest making lifestyle changes. For example, your doctor may ask you to eat healthy foods, quit smoking, lose extra weight, and be more active.  ?? If lifestyle changes don't help enough or your blood pressure is very high, you will have to take medicine every day.  Follow-up care is a key part of your treatment and safety. Be sure to make and go to all appointments, and call your doctor if you are having problems. It's also a good idea to know your test results and keep a list of the medicines you take.   Where can you learn more?   Go to [email or web address]  Enter P501 in the search box to learn more about "Learning About High Blood Pressure."   ?? 2006-2015 Healthwise, Incorporated. Care instructions adapted under license by [employer] (which disclaims liability or warranty for this information). This care instruction is for use with your licensed healthcare professional. If you have questions about a medical condition or this instruction, always ask your healthcare professional. [employer], Inc[employer] disclaims any warranty or liability for your use of this information.  Content Version: 10.5.422740; Current as of: December 18, 2013              Snoring: After Your Visit  Your Care Instructions   Snoring is a noise that you may make while breathing during sleep. You snore when the flow of air from your mouth or nose to your lungs makes the tissues of your throat vibrate while you sleep. This usually is caused by a blockage or narrowing in your nose, mouth, or throat (airway).  Snoring can be soft, loud, raspy, harsh, hoarse, or fluttering. Your bed partner may notice that you  sleep with your mouth open and that you are restless while sleeping. If snoring interferes with your or your bed partner's sleep, either or both of you may feel tired during the day.  You may be able to help reduce your snoring by making changes in your activities and in the way you sleep.  Follow-up care is a key part of your treatment and safety. Be sure to make and go to all appointments, and call your doctor if you are having problems. It's also a good idea to know your test results and keep a list of the medicines you take.  How can you care for yourself at home?  ?? Lose weight, if needed. Many people who snore are overweight. Weight loss can help reduce the narrowing of the airway and might reduce or stop snoring.  ?? Limit the use of alcohol and medicines. Drinking a lot of alcohol or taking certain medicines, especially sleeping pills or tranquilizers, before sleep may make snoring worse.  ?? Go to bed at the same time each night, and get plenty of sleep. You may snore more when you have not had enough sleep.  ?? Sleep on your side. Sleeping on your side may stop snoring. Try sewing a pocket in the middle of the back of your pajama top, putting a tennis ball into the pocket, and stitching it closed. This will help keep you from sleeping on your back.  ?? Treat breathing problems. Breathing problems caused by colds or allergies can disturb airflow. This can lead to snoring.  ?? Use a device that helps keep your airway open during sleep. This could be a device that you put in your mouth. Other examples include strips or  disks that you use on your nose.  ?? Do not smoke. Smoking can make snoring worse. If you need help quitting, talk to your doctor about stop-smoking programs and medicines. These can increase your chances of quitting for good.  ?? Raise the head of your bed 4 to 6 inches by putting bricks under the legs of the bed. This may prevent your tongue from falling toward the back of the throat, which can make a blocked or narrow airway worse. Putting pillows under your head will not help.  When should you call for help?  Watch closely for changes in your health, and be sure to contact your doctor if:  ?? You snore, and you feel sleepy during the day.  ?? Your sleeping partner or you notice that you gasp, choke, or stop breathing during sleep.  ?? You do not get better as expected.   Where can you learn more?   Go to [email or web address]  Enter J563 in the search box to learn more about "Snoring: After Your Visit."   ?? 2006-2015 Healthwise, Incorporated. Care instructions adapted under license by [employer] (which disclaims liability or warranty for this information). This care instruction is for use with your licensed healthcare professional. If you have questions about a medical condition or this instruction, always ask your healthcare professional. Healthwise, Incorporated disclaims any warranty or liability for your use of this information.  Content Version: 10.5.422740; Current as of: July 07, 2013              Learning About Vitamin D  Why is it important to get enough vitamin D?  Your body needs vitamin D to absorb calcium. Calcium keeps your bones and muscles, including your heart, healthy and strong. If your muscles don't get enough calcium,  they can cramp, hurt, or feel weak. You may have long-term (chronic) muscle aches and pains.  If you don't get enough vitamin D throughout life, you have an increased chance of having thin and brittle bones (osteoporosis) in your later  years. Children who don't get enough vitamin D may not grow as much as others their age. They also have a chance of getting a rare disease called rickets. It causes weak bones.  Vitamin D and calcium are added to many foods. And your body uses sunshine to make its own vitamin D.  How much vitamin D do you need?  The Institute of Medicine recommends that people ages 1 through 22 get 600 IU (international units) every day. Adults 71 and older need 800 IU every day.  Blood tests for vitamin D can check your vitamin D level. But there is no standard normal range used by all laboratories. The Institute of Medicine recommends a blood level of 20 ng/mL of vitamin D for healthy bones. And most people in the [place] and [place] meet this goal.  How can you get more vitamin D?  Foods that contain vitamin D include:  ?? Salmon, tuna, and mackerel. These are some of the best foods to eat when you need to get more vitamin D.  ?? Cheese, egg yolks, and beef liver. These foods have vitamin D in small amounts.  ?? Milk, soy drinks, orange juice, yogurt, margarine, and some kinds of cereal have vitamin D added to them.  Some people don't make vitamin D as well as others. They may have to take extra care in getting enough vitamin D.  Things that reduce how much vitamin D your body makes include:  ?? Dark skin, such as many African Americans have.  ?? Age, especially if you are older than 67.  ?? Digestive problems, such as Crohn's or celiac disease.  ?? Liver and kidney disease.  Some people who do not get enough vitamin D may need supplements.  Are there any risks from taking vitamin D?  ?? Too much vitamin D:  ?? Can damage your kidneys.  ?? Can cause nausea and vomiting, constipation, and weakness.  ?? Raises the amount of calcium in your blood. If this happens, you can get confused or have an irregular heart rhythm.  ?? Vitamin D may interact with other medicines. Tell your doctor about all  of the medicines you take, including over-the-counter drugs, herbs, and pills. Tell your doctor about all of your current medical problems.   Where can you learn more?   Go to [email or web address]  Enter V530 in the search box to learn more about "Learning About Vitamin D."   ?? 2006-2015 Healthwise, Incorporated. Care instructions adapted under license by [employer] (which disclaims liability or warranty for this information). This care instruction is for use with your licensed healthcare professional. If you have questions about a medical condition or this instruction, always ask your healthcare professional. Healthwise, Incorporated disclaims any warranty or liability for your use of this information.  Content Version: 10.5.422740; Current as of: September 11, 2013              Insulin Resistance: After Your Visit  Your Care Instructions  Insulin resistance means that the body cannot use insulin properly. Insulin lets sugar (glucose) enter the body's cells, where it is used for energy. It also helps muscles, fat, and liver cells store sugar to be released when needed. If the body tissues do not respond  properly to insulin, the blood sugar level rises.  Insulin resistance mainly is caused by obesity, although other medical conditions, such as acromegaly and Cushing???s syndrome, also can cause it. It can run in families too.  Follow-up care is a key part of your treatment and safety. Be sure to make and go to all appointments, and call your doctor if you are having problems. It???s also a good idea to know your test results and keep a list of the medicines you take.  How can you care for yourself at home?  ?? Take your medicines exactly as prescribed. Call your doctor if you think you are having a problem with your medicine. You will get more details on the specific medicines your doctor prescribes.  ?? Eat a good diet that spreads carbohydrate throughout the day.   ?? Get at least 30 minutes of exercise on most days of the week. Exercise helps control your blood sugar. It also helps you maintain a healthy weight. Walking is a good choice. You also may want to do other activities, such as running, swimming, cycling, or playing tennis or team sports.  ?? Try to lose weight. Losing even a small amount of weight can help.  ?? Do not smoke. If you need help quitting, talk to your doctor about stop-smoking programs and medicines. These can increase your chances of quitting for good.  When should you call for help?  Call 911 if you have symptoms of a heart attack. These may include:  ?? Chest pain or pressure, or a strange feeling in the chest.  ?? Sweating.  ?? Shortness of breath.  ?? Nausea or vomiting.  ?? Pain, pressure, or a strange feeling in the back, neck, jaw, or upper belly or in one or both shoulders or arms.  ?? Lightheadedness or sudden weakness.  ?? A fast or irregular heartbeat.  After calling 911, the operator may tell you to chew 1 adult-strength or 2 to 4 low-dose aspirin. Wait for an ambulance. Do not try to drive yourself.  Call 911 if you have signs of a stroke, such as:  ?? Sudden numbness, paralysis, or weakness in your face, arm, or leg, especially on only one side of your body.  ?? New problems with walking or balance.  ?? Sudden vision changes.  ?? Drooling or slurred speech.  ?? New problems speaking or understanding simple statements, or feeling confused.  ?? A sudden, severe headache that is different from past headaches.  Call your doctor now or seek immediate medical care if:  ?? You have any symptoms of diabetes. These may include:  ?? Being thirsty more often.  ?? Urinating more.  ?? Being hungrier.  ?? Losing weight.  ?? Being very tired.  ?? Having blurry vision.  ?? You have a wound that will not heal.  ?? You have an infection that will not go away.  ?? You have problems with your blood pressure.  Watch closely for changes in your health, and be sure to contact your  doctor if you have any problems.   Where can you learn more?   Go to [email or web address]  Enter V537 in the search box to learn more about "Insulin Resistance: After Your Visit."   ?? 2006-2015 Healthwise, Incorporated. Care instructions adapted under license by [employer] (which disclaims liability or warranty for this information). This care instruction is for use with your licensed healthcare professional. If you have questions about a medical condition or this instruction, always  ask your healthcare professional. Healthwise, Incorporated disclaims any warranty or liability for your use of this information.  Content Version: 10.5.422740; Current as of: September 11, 2013              Diet and Exercise for Metabolic Syndrome: After Your Visit  Your Care Instructions  Metabolic syndrome is the name for a group of health problems. It includes having too much fat around your waist and high blood pressure. It also includes high triglycerides, high blood sugar, and low levels of healthy (HDL) cholesterol. These problems make it more likely you will have a heart attack or stroke or get diabetes.  Your family history (your genes) can cause metabolic syndrome. So can unhealthy eating habits and not getting enough exercise.  You can help lower your risk of heart attack, stroke, and diabetes if you eat healthy foods and get more exercise. It may be hard to make these lifestyle changes. But even small changes can help.  Follow-up care is a key part of your treatment and safety. Be sure to make and go to all appointments, and call your doctor if you are having problems. It's also a good idea to know your test results and keep a list of the medicines you take.  How can you care for yourself at home?  Eat more fruits and vegetables  ?? Fruits and vegetables have nutrients to help protect you from heart disease and high blood pressure. They are low in fat and high in fiber.  Dark green, orange, and yellow ones are the healthiest.  ?? Keep lots of vegetables ready for snacks.  ?? Buy fruit that is in season. Then put it where you can see it so you will want to eat it.  ?? Cook dishes that have a lot of vegetables. Soups and stir-fries are good choices.  Limit saturated and trans fats  ?? Read food labels, and try to avoid saturated and trans fats. They increase your risk of heart disease.  ?? Use olive or canola oil when you cook.  ?? Bake, broil, grill, or steam foods. Avoid fried foods.  ?? Limit how much high-fat meat you eat. This includes hot dogs and sausages. When you prepare meat, cut off all the fat.  ?? You can replace high-fat meat with fish and skinless poultry. You can also try products made from soybeans, like tofu. Soybeans may be very good for your heart.  ?? Eat at least 2 servings of fish a week. Fish with omega-3 fatty acids may help reduce your risk of heart attack. Salmon and sardines are good examples.  ?? Choose low-fat or fat-free milk and dairy products.  Eat foods high in fiber  ?? Foods high in fiber may reduce your cholesterol. And they may give you important vitamins and minerals. Good examples are oatmeal, cooked dried beans, brown rice, citrus fruits, and apples.  ?? Eat whole-grain breads and cereals. They have more fiber than white bread or pastries.  Limit high-sugar foods  ?? Limit foods and drinks that are high in sugar. Some examples are soda pop, sugar-sweetened fruit drinks, candy, and many desserts.  ?? Limit the amount of sugar, honey, and other sweeteners that you add to food and drinks.  ?? Choose water instead of soda pop or other sugar-sweetened drinks.  ?? Limit fruit juice.  Limit salt and sodium  ?? You can help lower your blood pressure if you limit salt and sodium.  ?? If you take the salt  shaker off the table, it may be easier to use less salt. You can also try using half the salt in a recipe. And you can avoid  adding salt to cooking water for pasta, rice, and potatoes.  ?? Try to eat fewer snacks, fast foods, and other high-salt, processed foods. Check labels so you know how much sodium a food has.  ?? Choose canned goods (soups, vegetables, and beans) that are low in sodium.  Get regular exercise  ?? Get more exercise. Make sure your doctor knows when you start a new exercise program. Even small amounts of exercise will help you get stronger and have more energy. It can also help you manage your weight and your stress.  ?? Walking is a good choice. Little by little, increase the amount you walk every day. Try for at least 30 minutes on most days of the week.   Where can you learn more?   Go to [email or web address]  Enter H930 in the search box to learn more about "Diet and Exercise for Metabolic Syndrome: After Your Visit."   ?? 2006-2015 [employer], Inc[employer]. Care instructions adapted under license by [employer] (which disclaims liability or warranty for this information). This care instruction is for use with your licensed healthcare professional. If you have questions about a medical condition or this instruction, always ask your healthcare professional. Healthwise, Incorporated disclaims any warranty or liability for your use of this information.  Content Version: 10.5.422740; Current as of: September 11, 2013

## 2014-04-25 NOTE — Progress Notes (Signed)
HISTORY OF PRESENT ILLNESS  Steven [name] is a 58 y.o. male.  HPI The patient is a 58 y.o. male who is seen for follow up of Hyperlipidemia, HTN, Pre-Diabetes, Testicular Hypofunction, Chronic Radicular Low Back Pain and Vitamin D Deficiency.  Symptoms are symptoms have progressed to a point and plateaued.  The patient is having difficulty following treatment regimen, patient is feeling worse, he states that he is unusually tired all the time even though he is taking all of his meds as directed except he only takes Bystolic 10 mg qhs instead of the 20 mg qhs that he is prescribed. He is also working out regularly too., having some side effects as follows: he believes that the Cymbalta is making him excessively sleepy.Marland Kitchen.  He  is following an appropriate diet and is exercising regularly.     He admits that he has only been only taking 10 mg of Bystolic qhs instead of the 20 mg qhs that he was prescribed because he was trying to make the med last longer. He states that he is frustrated because he has been working out regularly again but he is still gaining weight.    He states that the Cymbalta has caused him to have excessive sweating, sexual SEs (delayed ejaculation), no pain relief and excessive sleepiness during the day.    His wife reports that he snores so loudly that she often does not sleep in the same room with him.  Review of Systems   Constitutional: Positive for malaise/fatigue and diaphoresis (excessive sweating). Negative for weight loss.   HENT: Negative for congestion.    Eyes: Negative for blurred vision.   Respiratory: Negative for cough and shortness of breath.         Snores very loudly according to his wife.   Cardiovascular: Negative for chest pain, palpitations and leg swelling.   Gastrointestinal: Negative for heartburn, nausea, vomiting, abdominal pain, diarrhea, constipation, blood in stool and melena.   Genitourinary: Negative for dysuria, urgency and frequency.         He c/o having delayed ejaculation since he has been taking the Cymbalta.    Musculoskeletal: Positive for myalgias and back pain (Chronic low back pain that radiates down his LLE).   Skin: Negative for itching and rash.   Neurological: Negative for dizziness, sensory change, focal weakness, weakness and headaches.   Endo/Heme/Allergies: Negative for environmental allergies and polydipsia.   Psychiatric/Behavioral: Negative for depression, suicidal ideas and substance abuse. The patient is nervous/anxious (He has not had any panic attacks since his last visit.). The patient does not have insomnia.    All other systems reviewed and are negative.      BP 134/78 mmHg   Pulse 67   Temp(Src) 97.6 ??F (36.4 ??C)   Ht 5\' 11"  (1.803 m)   Wt 276 lb (125.193 kg)   BMI 38.51 kg/m2   SpO2 97%   He has gained 6 lb since 11/14.      Physical Exam   Constitutional: He is oriented to person, place, and time. Vital signs are normal. He appears well-developed and well-nourished. He does not appear ill (but he looks exhausted). No distress.   HENT:   Head: Normocephalic and atraumatic.   Nose: Nose normal.   Mouth/Throat: Uvula is midline, oropharynx is clear and moist and mucous membranes are normal. No oral lesions.   Eyes: Conjunctivae are normal. No scleral icterus.   Neck: Trachea normal, normal range of motion and phonation normal. Neck supple.  No JVD present. No thyromegaly present.   Cardiovascular: Normal rate, regular rhythm, normal heart sounds, intact distal pulses and normal pulses.    No peripheral edema noted.   Pulmonary/Chest: Effort normal and breath sounds normal.   Abdominal: Soft. Normal appearance and bowel sounds are normal. There is no hepatosplenomegaly. There is no tenderness. There is no CVA tenderness.   Musculoskeletal:        Lumbar back: He exhibits decreased range of motion, tenderness, bony tenderness (Left S-I joint) and pain (primarily in his left low back and  it radiates down his LLE). He exhibits no edema, no deformity, no spasm and normal pulse.   Lymphadenopathy:     He has no cervical adenopathy.   Neurological: He is alert and oriented to person, place, and time. He has normal strength. No cranial nerve deficit or sensory deficit. Gait (he ambulates slowly leaning forward with an antalgic gait d/t his chronic low back pain that radiates down his LLE ) abnormal.   Skin: Skin is warm and dry. No rash noted.   Psychiatric: He has a normal mood and affect. His speech is normal and behavior is normal. Judgment and thought content normal. Cognition and memory are normal.   Nursing note and vitals reviewed.      ASSESSMENT and PLAN    ICD-9-CM    1. Pure hypercholesterolemia 272.0 METABOLIC PANEL, COMPREHENSIVE     LIPID PANEL   2. Pre-diabetes 790.29 MICROALBUMIN:CREATININE RATIO, RANDOM URINE     HEMOGLOBIN A1C   3. Essential hypertension, benign 401.1 AMB POC COMPLETE CBC,AUTOMATED ENTER     AMB POC URINALYSIS DIP STICK MANUAL W/ MICRO     TSH, 3RD GENERATION   4. Other testicular hypofunction 257.2 AMB POC COMPLETE CBC,AUTOMATED ENTER     TESTOSTERONE, FREE     TESTOSTERONE, TOTAL, ADULT MALE     PROSTATE SPECIFIC AG (PSA)   5. Vitamin D deficiency 268.9 VITAMIN D, 25 HYDROXY   6. Chronic radicular low back pain 724.4 REFERRAL TO PHYSICAL THERAPY    338.29    7. Need for hepatitis C screening test V73.89 HEPATITIS C AB   8. Snoring 786.09 REFERRAL TO SLEEP STUDIES   9. Other malaise and fatigue 780.79 REFERRAL TO SLEEP STUDIES   10. Obesity, unspecified 278.00 phentermine (ADIPEX-P) 37.5 mg tablet   Labs checked and I will treat as needed when I see the results. I told him to keep taking Bystolic 10 mg qhs because his BP is at goal today.  I recommended that he start taking his Testosterone 100 mg IM q week rather than 200 mg IM q 2 weeks to see if that helps his energy level. He is also going to stop taking the Cymbalta d/t the fatigue and sexual side effects.  I have given him written instructions on how to wean off of it. He does not want to start on any other anti-anxiety/anti-depressant med at this time. I have referred him to a Sleep MD for f/u on his chronic fatigue and snoring as he may have Sleep Apnea. I have also referred him to PT for evaluation and treatment of his chronic radicular low back pain. He will take the Phentermine as directed to help him with portion control while he is trying to lose weight. He will call for refills when he needs them. He will continue to follow a low cholesterol, low glycemic index diet and to exercise regularly. He will RTO in 6 months for a recheck and  repeat fasting labs.

## 2014-04-29 ENCOUNTER — Encounter

## 2014-04-29 MED ORDER — TESTOSTERONE CYPIONATE 200 MG/ML IM OIL
200 mg/mL | INTRAMUSCULAR | Status: DC
Start: 2014-04-29 — End: 2014-05-10

## 2014-04-29 NOTE — Telephone Encounter (Signed)
Pt stated that KKB was going to increase his dosage to weekly injections.

## 2014-04-29 NOTE — Telephone Encounter (Signed)
From: Steven DuboisMichael L Reaver  To: Steven KittenKathryn K Benson, Steven [name]  Sent: 04/29/2014 5:11 PM EDT  Subject:  Medication Renewal Request    Original  authorizing provider: [name], Steven [name]    Steven DuboisMichael  L [name] would like a refill of the following medications:  DULoxetine  (CYMBALTA) 60 mg capsule Steven Kitten[[name], Steven [name]]    Preferred  pharmacy: [id number] - [place], [place] - [address]    Comment:  Steven MessierKathy,  I believe you were going to look into an alternative to Cymbalta because I said that it didn't seem to do anything. I was wrong it does on mood and minor pain. [facility]. [facility] could get [facility] 10[facility] Monday. Thx my cell is [phone]

## 2014-04-29 NOTE — Telephone Encounter (Signed)
Call in rx - her note says to take 100mg  every week - one rx to call in and [facility] follow-up/when to recheck.

## 2014-04-29 NOTE — Telephone Encounter (Signed)
Rx called in.

## 2014-05-03 MED ORDER — DULOXETINE 60 MG CAP, DELAYED RELEASE
60 mg | ORAL_CAPSULE | Freq: Every day | ORAL | Status: DC
Start: 2014-05-03 — End: 2014-06-25

## 2014-05-10 ENCOUNTER — Encounter

## 2014-05-10 MED ORDER — TESTOSTERONE CYPIONATE 200 MG/ML IM OIL
200 mg/mL | INTRAMUSCULAR | Status: DC
Start: 2014-05-10 — End: 2014-08-09

## 2014-05-11 NOTE — Telephone Encounter (Signed)
Rx called in.

## 2014-05-16 MED ORDER — LISINOPRIL 40 MG TAB
40 mg | ORAL_TABLET | ORAL | Status: DC
Start: 2014-05-16 — End: 2014-07-14

## 2014-05-16 MED ORDER — METFORMIN 1,000 MG TAB
1000 mg | ORAL_TABLET | ORAL | Status: DC
Start: 2014-05-16 — End: 2014-11-17

## 2014-05-24 MED ORDER — PRAVASTATIN 40 MG TAB
40 mg | ORAL_TABLET | ORAL | Status: DC
Start: 2014-05-24 — End: 2014-11-21

## 2014-06-25 ENCOUNTER — Telehealth

## 2014-06-25 MED ORDER — LAMOTRIGINE 25 MG TAB
25 mg | ORAL_TABLET | ORAL | Status: DC
Start: 2014-06-25 — End: 2014-07-25

## 2014-06-25 NOTE — Telephone Encounter (Signed)
Wife told me I needed to remind you about sending in a new Rx for pt's moods.

## 2014-06-25 NOTE — Telephone Encounter (Signed)
Rx sent. kkb

## 2014-07-14 MED ORDER — PREDNISONE 20 MG TAB
20 mg | ORAL_TABLET | ORAL | Status: DC
Start: 2014-07-14 — End: 2014-08-19

## 2014-07-14 MED ORDER — AZITHROMYCIN 250 MG TAB
250 mg | ORAL | Status: AC
Start: 2014-07-14 — End: 2014-07-19

## 2014-07-14 MED ORDER — OLMESARTAN-HYDROCHLOROTHIAZIDE 40 MG-12.5 MG TAB
ORAL_TABLET | Freq: Every day | ORAL | Status: DC
Start: 2014-07-14 — End: 2014-08-05

## 2014-07-14 MED ORDER — MOMETASONE-FORMOTEROL HFA 100 MCG-5 MCG/ACTUATION AEROSOL INHALER
100-5 mcg/actuation | Freq: Two times a day (BID) | RESPIRATORY_TRACT | Status: DC
Start: 2014-07-14 — End: 2014-08-23

## 2014-07-14 MED ORDER — ALBUTEROL SULFATE HFA 90 MCG/ACTUATION AEROSOL INHALER
90 mcg/actuation | RESPIRATORY_TRACT | Status: DC
Start: 2014-07-14 — End: 2014-08-23

## 2014-07-15 NOTE — Patient Instructions (Signed)
Managing Your Allergies: After Your Visit  Your Care Instructions  Managing your allergies is an important part of staying healthy. Your doctor will help you find out what may be causing the allergies. Common causes of allergy symptoms are house dust and dust mites, animal dander, mold, and pollen.  As soon as you know what triggers your symptoms, try to reduce your exposure to your triggers. This can help prevent allergy symptoms, asthma, and other health problems.  Ask your doctor about allergy medicine or immunotherapy. These treatments may help reduce or prevent allergy symptoms.  Follow-up care is a key part of your treatment and safety. Be sure to make and go to all appointments, and call your doctor if you are having problems. It's also a good idea to know your test results and keep a list of the medicines you take.  How can you care for yourself at home?  ?? If you think that dust or dust mites are causing your allergies:  ?? Wash sheets, pillowcases, and other bedding every week in hot water.  ?? Use airtight, dust-proof covers for pillows, duvets, and mattresses. Avoid plastic covers, because they tend to tear quickly and do not "breathe." Wash according to the instructions.  ?? Remove extra blankets and pillows that you don't need.  ?? Use blankets that are machine-washable.  ?? Don't use home humidifiers. They can help mites live longer.  ?? Use air-conditioning. Change or clean all filters every month. Keep windows closed. Use high-efficiency air filters. Don't use window or attic fans, which draw dust into the air.  ?? If you're allergic to pet dander, keep pets outside or, at the very least, out of your bedroom. Old carpet and cloth-covered furniture can hold a lot of animal dander. You may need to replace them.  ?? Look for signs of cockroaches. Use cockroach baits to get rid of them. Then clean your home well.  ?? If you're allergic to mold, don't keep indoor plants, because molds can  grow in soil. Get rid of furniture, rugs, and drapes that smell musty. Check for mold in the bathroom.  ?? If you're allergic to pollen, stay inside when pollen counts are high.  ?? Don't smoke or let anyone else smoke in your house. Don't use fireplaces or wood-burning stoves. Avoid paint fumes, perfumes, and other strong odors.  When should you call for help?  Call 911 anytime you think you may need emergency care. For example, call if:  ?? You have symptoms of a severe allergic reaction. These may include:  ?? Sudden raised, red areas (hives) all over your body.  ?? Swelling of the throat, mouth, lips, or tongue.  ?? Trouble breathing.  ?? Passing out (losing consciousness). Or you may feel very lightheaded or suddenly feel weak, confused, or restless.  Watch closely for changes in your health, and be sure to contact your doctor if:  ?? Your allergies get worse.  ?? You need help controlling your allergies.  ?? You have questions about allergy testing.  ?? You do not get better as expected.   Where can you learn more?   Go to http://www.healthwise.net/[facility]  Enter L249 in the search box to learn more about "Managing Your Allergies: After Your Visit."   ?? 2006-2015 Healthwise, Incorporated. Care instructions adapted under license by [employer] (which disclaims liability or warranty for this information). This care instruction is for use with your licensed healthcare professional. If you have questions about a medical condition or   this instruction, always ask your healthcare professional. Healthwise, Incorporated disclaims any warranty or liability for your use of this information.  Content Version: 10.5.422740; Current as of: December 18, 2013              Learning About High Blood Pressure  What is high blood pressure?     Blood pressure is a measure of how hard the blood pushes against the walls of your arteries. It's normal for blood pressure to go up and down  throughout the day, but if it stays up, you have high blood pressure. Another name for high blood pressure is hypertension.  Two numbers tell you your blood pressure. The first number is the systolic pressure. It shows how hard the blood pushes when your heart is pumping. The second number is the diastolic pressure. It shows how hard the blood pushes between heartbeats, when your heart is relaxed and filling with blood.  A blood pressure of less than 120/80 (say "120 over 80") is ideal for an adult. High blood pressure is 140/90 or higher. Many people fall into the category in between, called prehypertension. People with prehypertension need to make lifestyle changes to bring their blood pressure down and help prevent or delay high blood pressure.  What happens when you have high blood pressure?  ?? Blood flows through your arteries with too much force. Over time, this damages the walls of your arteries. But you can't feel it. High blood pressure usually doesn't cause symptoms.  ?? Fat and calcium start to build up in your arteries. This buildup is called plaque. Plaque makes your arteries narrower and stiffer. Blood can't flow through them as easily.  ?? This lack of good blood flow starts to damage some of the organs in your body. This can lead to problems such as coronary artery disease and heart attack, heart failure, stroke, kidney failure, and eye damage.  How can you prevent high blood pressure?  ?? Stay at a healthy weight.  ?? Try to limit how much sodium you eat to less than 2,300 milligrams (mg) a day. And try to limit the sodium you eat to less than 1,500 mg a day if you are 51 or older, are black, or have high blood pressure, diabetes, or chronic kidney disease.  ?? Buy foods that are labeled "unsalted," "sodium-free," or "low-sodium." Foods labeled "reduced-sodium" and "light sodium" may still have too much sodium.  ?? Flavor your food with garlic, lemon juice, onion, vinegar, herbs, and  spices instead of salt. Do not use soy sauce, steak sauce, onion salt, garlic salt, mustard, or ketchup on your food.  ?? Use less salt (or none) when recipes call for it. You can often use half the salt a recipe calls for without losing flavor.  ?? Be physically active. Get at least 30 minutes of exercise on most days of the week. Walking is a good choice. You also may want to do other activities, such as running, swimming, cycling, or playing tennis or team sports.  ?? Limit alcohol to 2 drinks a day for men and 1 drink a day for women.  ?? Eat plenty of fruits, vegetables, and low-fat dairy products. Eat less saturated and total fats.  How is high blood pressure treated?  ?? Your doctor will suggest making lifestyle changes. For example, your doctor may ask you to eat healthy foods, quit smoking, lose extra weight, and be more active.  ?? If lifestyle changes don't help enough or your blood  pressure is very high, you will have to take medicine every day.  Follow-up care is a key part of your treatment and safety. Be sure to make and go to all appointments, and call your doctor if you are having problems. It's also a good idea to know your test results and keep a list of the medicines you take.   Where can you learn more?   Go to [email or web address]  Enter P501 in the search box to learn more about "Learning About High Blood Pressure."   ?? 2006-2015 Healthwise, Incorporated. Care instructions adapted under license by [employer] (which disclaims liability or warranty for this information). This care instruction is for use with your licensed healthcare professional. If you have questions about a medical condition or this instruction, always ask your healthcare professional. Healthwise, Incorporated disclaims any warranty or liability for your use of this information.  Content Version: 10.5.422740; Current as of: December 18, 2013              Learning About Mood Disorders  What are mood disorders?   Mood disorders are medical problems that affect how you feel. They can impact your moods, thoughts, and actions. Mood disorders include:  ?? Depression. This causes you to feel sad and hopeless for much of the time.  ?? Bipolar disorder. This causes extreme mood changes from manic episodes of very high energy to extreme lows of depression.  ?? Seasonal affective disorder (SAD). This is a type of depression that affects you during the same season each year. Most often people experience SAD during the fall and winter months when days are shorter and there is less light.  What are the symptoms?  Depression  You may:  ?? Feel sad or hopeless nearly every day.  ?? Lose interest in or not get pleasure from most daily activities. You feel this way nearly every day.  ?? Have low energy, changes in your appetite, or changes in how well you sleep.  ?? Have trouble concentrating.  ?? Think about death and suicide. Keep the numbers for these national suicide hotlines: 1-800-273-TALK 351-505-7128) and 1-800-SUICIDE (937)299-5315). If you or someone you know talks about suicide or feeling hopeless, get help right away.  Bipolar disorder  Symptoms depend on your mood swings. You may:  ?? Feel very happy, energetic, or on edge.  ?? Feel like you need very little sleep.  ?? Feel overly self-confident.  ?? Do impulsive things, such as spending a lot of money.  ?? Feel sad or hopeless.  ?? Have racing thoughts or trouble thinking and making decisions.  ?? Lose interest in things you have enjoyed in the past.  Seasonal affective disorder (SAD)  Symptoms come and go at about the same time each year. For most people with SAD, symptoms come during the winter when there is less daylight. You may:  ?? Feel sad, grumpy, moody, or anxious.  ?? Lose interest in your usual activities.  ?? Eat more and crave carbohydrates, such as bread and pasta.  ?? Gain weight.  ?? Sleep more and feel drowsy during the daytime.  How are mood disorders treated?   Mood disorders can be treated with medicines or counseling, or a combination of both.  Medicines for depression and SAD may include antidepressants.  Medicines for bipolar disorder may include:  ?? Mood stabilizers.  ?? Antipsychotics.  ?? Benzodiazepines.  Counseling may involve cognitive-behavioral therapy. It teaches you how to change the ways you think and behave. This can  help you stop thinking bad thoughts about yourself and your life.  Light therapy is the main treatment for SAD. This therapy uses a special kind of lamp. You let the lamp shine on you at certain times, usually in the morning. This may help your symptoms during the months when there is less sunlight.  Healthy lifestyle  Healthy lifestyle changes may help you feel better.  ?? Get at least 30 minutes of exercise on most days of the week. Walking is a good choice.  ?? Eat a healthy diet. Include fruits, vegetables, lean proteins, and whole grains in your diet each day.  ?? Keep a regular sleep schedule. Try for 8 hours of sleep a night.  ?? Find ways to manage stress, such as relaxation exercises.  ?? Avoid alcohol and illegal drugs.  Follow-up care is a key part of your treatment and safety. Be sure to make and go to all appointments, and call your doctor if you are having problems. It's also a good idea to know your test results and keep a list of the medicines you take.   Where can you learn more?   Go to [email or web address]  Enter Z126 in the search box to learn more about "Learning About Mood Disorders."   ?? 2006-2015 Healthwise, Inc[employer]. Care instructions adapted under license by [employer] (which disclaims liability or warranty for this information). This care instruction is for use with your licensed healthcare professional. If you have questions about a medical condition or this instruction, always ask your healthcare professional. Healthwise, Incorporated disclaims any warranty or liability for your use of this  information.  Content Version: 10.5.422740; Current as of: September 11, 2013              Reactive Airway Disease: After Your Visit  Your Care Instructions  Reactive airway disease is a breathing problem that appears as wheezing, a whistling noise in your airways. It may be caused by a viral or bacterial infection, allergies, tobacco smoke, or something else in the environment. When you are around these triggers, your body releases chemicals that make the airways get tight.  Reactive airway disease is a lot like asthma. Both can cause wheezing. But asthma is ongoing, while reactive airway disease may occur only now and then. Tests can be done to tell whether you have asthma. You may take the same medicines used to treat asthma. Good home care and follow-up care with your doctor can help you recover.  Follow-up care is a key part of your treatment and safety. Be sure to make and go to all appointments, and call your doctor if you are having problems. It's also a good idea to know your test results and keep a list of the medicines you take.  How can you care for yourself at home?  ?? Take your medicines exactly as prescribed. Call your doctor if you think you are having a problem with your medicine.  ?? Do not smoke or allow others to smoke around you. If you need help quitting, talk to your doctor about stop-smoking programs and medicines. These can increase your chances of quitting for good.  ?? If you know what caused your wheezing (such as perfume or the odor of household chemicals), try to avoid it in the future.  ?? Wash your hands several times a day, and consider using hand gels or wipes that contain alcohol. This can prevent colds and other infections.  When should you call for help?  Call 911  anytime you think you may need emergency care. For example, call if:  ?? You have severe trouble breathing.  Watch closely for changes in your health, and be sure to contact your doctor if:   ?? You cough up yellow, dark brown, or bloody mucus.  ?? You have a fever.  ?? Your wheezing gets worse.   Where can you learn more?   Go to [email or web address]  Enter G945 in the search box to learn more about "Reactive Airway Disease: After Your Visit."   ?? 2006-2015 Healthwise, Inc[employer]. Care instructions adapted under license by [employer] (which disclaims liability or warranty for this information). This care instruction is for use with your licensed healthcare professional. If you have questions about a medical condition or this instruction, always ask your healthcare professional. Healthwise, Incorporated disclaims any warranty or liability for your use of this information.  Content Version: 10.5.422740; Current as of: July 07, 2013

## 2014-07-15 NOTE — Progress Notes (Signed)
HISTORY OF PRESENT ILLNESS  Steven [name] is a 58 y.o. male.  Leg Swelling  The history is provided by the patient. This is a chronic problem. The problem occurs daily. The problem has not changed since onset.Associated symptoms include chest pain (tightness), headaches and shortness of breath (on exertion). Pertinent negatives include no abdominal pain. Associated symptoms comments: Sinus congestion. The symptoms are aggravated by walking and standing (His lower legs and feet will swell if he has to be on his feet alot during the day, if he has to sit alot all day and if he eats salty foods). The symptoms are relieved by laying down and relaxation (Elevating his feet). He has tried rest (Elevation of his feet) for the symptoms. The treatment provided significant (The swelling goes down completely over night but then reappears during the day) relief.   Cough  The history is provided by the patient. This is a new problem. Episode onset: He has has a tight, wheezy cough for the past 3 weeks that is getting worse and not better. The problem occurs constantly. The problem has been gradually worsening. Associated symptoms include chest pain (tightness), headaches and shortness of breath (on exertion). Pertinent negatives include no abdominal pain. The symptoms are aggravated by walking, exertion, stress and bending. Nothing relieves the symptoms. He has tried nothing for the symptoms.       Review of Systems   Constitutional: Positive for malaise/fatigue. Negative for fever, chills, weight loss and diaphoresis (no more than usual).   HENT: Positive for congestion and sore throat. Negative for ear pain.    Eyes: Negative for blurred vision, discharge and redness.   Respiratory: Positive for cough, sputum production (small amount of thick, white or clear mucoid sputum), shortness of breath (on exertion) and wheezing (at times). Negative for hemoptysis.          Snores very loudly according to his wife. He refuses to have a sleep study. He wants to see if losing weight will help.   Cardiovascular: Positive for chest pain (tightness) and leg swelling. Negative for palpitations, orthopnea and claudication.   Gastrointestinal: Negative for heartburn, nausea, vomiting, abdominal pain, diarrhea, constipation, blood in stool and melena.   Genitourinary: Negative for dysuria, urgency and frequency.        He c/o having delayed ejaculation when he was taking the Cymbalta.    Musculoskeletal: Positive for myalgias and back pain (Chronic low back pain that radiates down his LLE).   Skin: Negative for itching and rash.   Neurological: Positive for headaches. Negative for dizziness, sensory change, focal weakness and weakness.   Endo/Heme/Allergies: Positive for environmental allergies. Negative for polydipsia.   Psychiatric/Behavioral: Negative for depression, suicidal ideas and substance abuse. The patient is nervous/anxious (He has not had any panic attacks since his last visit.) and has insomnia (d/t the cough).    All other systems reviewed and are negative.      BP 151/90 mmHg   Pulse 80   Temp(Src) 98 ??F (36.7 ??C) (Oral)   Ht  (1.803 m)   Wt 283 lb (128.368 kg)   BMI 39.49 kg/m2   SpO2 96%  He has gained 7 lb since 04/21/14 and 13 lb since 11/14.      Physical Exam   Constitutional: He is oriented to person, place, and time. Vital signs are normal. He appears well-developed and well-nourished. He appears ill (he looks exhausted). No distress.   HENT:   Head: Normocephalic and atraumatic.  Right Ear: Hearing, tympanic membrane, external ear and ear canal normal.   Left Ear: Hearing, tympanic membrane, external ear and ear canal normal.   Nose: Mucosal edema present. No rhinorrhea or sinus tenderness.   Mouth/Throat: Uvula is midline and mucous membranes are normal. No oral lesions. Posterior oropharyngeal erythema present. No oropharyngeal  exudate, posterior oropharyngeal edema or tonsillar abscesses.   Eyes: Conjunctivae and lids are normal. No scleral icterus.   Neck: Trachea normal and normal range of motion. Neck supple. No JVD present.   Cardiovascular: Normal rate, regular rhythm, normal heart sounds, intact distal pulses and normal pulses.    He has a trace of non-pitting edema in bilateral lower legs and feet. No discoloration of the skin noted.   Pulmonary/Chest: Effort normal. No stridor. No tachypnea. He has decreased breath sounds in the right middle field, the right lower field, the left middle field and the left lower field. He has no wheezes. He has no rhonchi. He has no rales. He exhibits no tenderness, no bony tenderness and no deformity.   Lymphadenopathy:     He has no cervical adenopathy.   Neurological: He is alert and oriented to person, place, and time. He has normal strength. No cranial nerve deficit or sensory deficit. Gait (he ambulates slowly leaning forward with an antalgic gait d/t his chronic low back pain that radiates down his LLE ) abnormal.   Skin: Skin is warm and dry. No rash noted.   Psychiatric: His speech is normal and behavior is normal. Judgment and thought content normal. His mood appears anxious (and irritable). His affect is not angry, not blunt, not labile and not inappropriate. Cognition and memory are normal. He does not exhibit a depressed mood.   Nursing note and vitals reviewed.      ASSESSMENT and PLAN    ICD-9-CM ICD-10-CM    1. Essential hypertension, benign 401.1 I10 olmesartan-hydrochlorothiazide (BENICAR HCT) 40-12.5 mg per tablet   2. Venous insufficiency of both lower extremities 459.81 I87.2 olmesartan-hydrochlorothiazide (BENICAR HCT) 40-12.5 mg per tablet   3. Cough due to bronchospasm 519.11 J98.01 NEBULIZER ADMINISTRATION SET      INHAL RX, AIRWAY OBST/DX SPUTUM INDUCT      ALBUTEROL IPRATROP NON-COMP      predniSONE (DELTASONE) 20 mg tablet       azithromycin (ZITHROMAX Z-PAK) 250 mg tablet      albuterol (PROVENTIL HFA, VENTOLIN HFA, PROAIR HFA) 90 mcg/actuation inhaler      mometasone-formoterol (DULERA) 100-5 mcg/actuation HFA inhaler- 2 puffs bid indefinitely   4. Allergic rhinitis, cause unspecified 477.9 J30.9 Recommended that he take OTC Zyrtec 10 mg 1 po qhs indefinitely.   5. Mood disorder (HCC) 296.90 F39 He will take his Lamictal Rx as directed and recheck with me in 4 weeks.   He stated that he felt much better after the neb treatment and he was moving air well in all lung fields on auscultation after the neb treatment. His BP is not at goal so I told him to stop taking the Lisinopril and Bystolic and I gave him samples of Benicar/HCT 40/12.5 mg to take 1 po every day. I asked him to stop by in 7-10 days to have his BP rechecked.  I taught him about the expected effects and possible side effects of the Lamictal that I ordered for him in late August to help decrease his irritability and moodiness and he verbalized understanding. He states that he filled the RX but was reticent to take it.  He has agreed to take it now. For the cough, he will rest, push fluids, take the meds as directed and RTO if no improvement.      He has agreed to take the Lamictal .

## 2014-07-26 MED ORDER — LAMOTRIGINE 100 MG TAB
100 mg | ORAL_TABLET | Freq: Every day | ORAL | Status: DC
Start: 2014-07-26 — End: 2014-08-19

## 2014-08-05 ENCOUNTER — Encounter

## 2014-08-06 MED ORDER — OLMESARTAN-HYDROCHLOROTHIAZIDE 40 MG-12.5 MG TAB
ORAL_TABLET | Freq: Every day | ORAL | Status: DC
Start: 2014-08-06 — End: 2014-08-11

## 2014-08-09 ENCOUNTER — Telehealth

## 2014-08-09 MED ORDER — TESTOSTERONE CYPIONATE 200 MG/ML IM OIL
200 mg/mL | INTRAMUSCULAR | Status: DC
Start: 2014-08-09 — End: 2014-11-09

## 2014-08-09 NOTE — Telephone Encounter (Signed)
Benicar Hct   Has patient ever tried one generic Angiotensin Receptor Blocker or ARB containing agents:  Candesartan, candesartan/HCTZ, eprosartan irbesartan/HCTZ losartan telmisartan/amlodipine, valsartan amlodipine HCTZ    I do not see where patient has tried any of the above medications... Only lisinopril and bystolic

## 2014-08-11 NOTE — Telephone Encounter (Signed)
Please call him and find out if his cough has gone away and let me know (very important). I have order one of the preferred medicines for him since his insurance won't cover the Benicar/HCT. Please insist that he come back in for a BP check after he has been on this new med for 7-10 days. Thanks. kkb

## 2014-08-12 MED ORDER — VALSARTAN-HYDROCHLOROTHIAZIDE 320 MG-12.5 MG TAB
ORAL_TABLET | Freq: Every day | ORAL | Status: DC
Start: 2014-08-12 — End: 2015-02-01

## 2014-08-13 NOTE — Telephone Encounter (Signed)
Spoke with Casimiro NeedleMichael... He paid out of pocket for Half of the Benicar HCT so that he would be covered while on vacation and since starting Benicar is cough has cleared up, what do you recommend at this point?

## 2014-08-13 NOTE — Telephone Encounter (Signed)
I already sent in a new Rx for Valsartan/HCT for him to start taking. That was on the list that his insurance covers. kkb

## 2014-08-16 NOTE — Telephone Encounter (Signed)
Perfect! kkb

## 2014-08-16 NOTE — Telephone Encounter (Signed)
Cough is gone feels better since starting Beincar HCT  Let patient know valsartan is waiting at pharmacy spoke with Joice LoftsAmber she told me to have [name] start valsartan HCT in am and come back in 7 to 10 days   For recheck with new medication    Right arm 130/82

## 2014-08-19 ENCOUNTER — Ambulatory Visit
Admit: 2014-08-19 | Discharge: 2014-08-19 | Payer: PRIVATE HEALTH INSURANCE | Attending: Family Medicine | Primary: Family Medicine

## 2014-08-19 DIAGNOSIS — I10 Essential (primary) hypertension: Secondary | ICD-10-CM

## 2014-08-19 MED ORDER — NEBIVOLOL 10 MG TAB
10 mg | ORAL_TABLET | Freq: Every evening | ORAL | Status: DC
Start: 2014-08-19 — End: 2014-11-15

## 2014-08-19 NOTE — Patient Instructions (Signed)
Hip Pain: After Your Visit  Your Care Instructions  Hip pain may be caused by many things, including overuse, a fall, or a twisting movement. Another cause of hip pain is arthritis. Your pain may increase when you stand up, walk, or squat. The pain may come and go or may be constant.  Home treatment can help relieve hip pain, swelling, and stiffness. If your pain is ongoing, you may need more tests and treatment.  Follow-up care is a key part of your treatment and safety. Be sure to make and go to all appointments, and call your doctor if you are having problems. It???s also a good idea to know your test results and keep a list of the medicines you take.  How can you care for yourself at home?  ?? Take pain medicines exactly as directed.  ?? If the doctor gave you a prescription medicine for pain, take it as prescribed.  ?? If you are not taking a prescription pain medicine, ask your doctor if you can take an over-the-counter medicine.  ?? Rest and protect your hip. Take a break from any activity, including standing or walking, that may cause pain.  ?? Put ice or a cold pack against your hip for 10 to 20 minutes at a time. Try to do this every 1 to 2 hours for the next 3 days (when you are awake) or until the swelling goes down. Put a thin cloth between the ice and your skin.  ?? Sleep on your healthy side with a pillow between your knees, or sleep on your back with pillows under your knees.  ?? If there is no swelling, you can put moist heat, a heating pad, or a warm cloth on your hip. Do gentle stretching exercises to help keep your hip flexible.  ?? Learn how to prevent falls. Have your vision and hearing checked regularly. Wear slippers or shoes with a nonskid sole.  ?? Stay at a healthy weight.  ?? Wear comfortable shoes.  When should you call for help?  Call 911 anytime you think you may need emergency care. For example, call if:  ?? You have sudden chest pain and shortness of breath, or you cough up blood.   ?? You are not able to stand or walk or bear weight.  ?? Your buttocks, legs, or feet feel numb or tingly.  ?? Your leg or foot is cool or pale or changes color.  ?? You have severe pain.  Call your doctor now or seek immediate medical care if:  ?? You have signs of infection, such as:  ?? Increased pain, swelling, warmth, or redness in the hip area.  ?? Red streaks leading from the hip area.  ?? Pus draining from the hip area.  ?? A fever.  ?? You have signs of a blood clot, such as:  ?? Pain in your calf, back of the knee, thigh, or groin.  ?? Redness and swelling in your leg or groin.  ?? You are not able to bend, straighten, or move your leg normally.  ?? You have trouble urinating or having bowel movements.  Watch closely for changes in your health, and be sure to contact your doctor if:  ?? You do not get better as expected.   Where can you learn more?   Go to http://www.healthwise.net/[facility]  Enter Z720 in the search box to learn more about "Hip Pain: After Your Visit."   ?? 2006-2015 Healthwise, Incorporated. Care instructions adapted under license   by [employer] [place] (which disclaims liability or warranty for this information). This care instruction is for use with your licensed healthcare professional. If you have questions about a medical condition or this instruction, always ask your healthcare professional. Healthwise, Incorporated disclaims any warranty or liability for your use of this information.  Content Version: 10.5.422740; Current as of: September 11, 2013              Sacroiliac Pain: Exercises  Your Care Instructions  Here are some examples of typical rehabilitation exercises for your condition. Start each exercise slowly. Ease off the exercise if you start to have pain.  Your doctor or physical therapist will tell you when you can start these exercises and which ones will work best for you.  How to do the exercises  Knee-to-chest stretch     Do not do the knee-to-chest exercise if it causes or increases back or leg pain.  ?? Lie on your back with your knees bent and your feet flat on the floor. You can put a small pillow under your head and neck if it is more comfortable.  ?? Grasp your hands under one knee and bring the knee to your chest, keeping the other foot flat on the floor.  ?? Keep your lower back pressed to the floor. Hold for at least 15 to 30 seconds.  ?? Relax and lower the knee to the starting position. Repeat with the other leg.  ?? Repeat 2 to 4 times with each leg.  ?? To get more stretch, keep your other leg flat on the floor while pulling your knee to your chest.  Bridging    ?? Lie on your back with both knees bent. Your knees should be bent about 90 degrees.  ?? Tighten your belly muscles by pulling in your belly button toward your spine. Then push your feet into the floor, squeeze your buttocks, and lift your hips off the floor until your shoulders, hips, and knees are all in a straight line.  ?? Hold for about 6 seconds as you continue to breathe normally, and then slowly lower your hips back down to the floor and rest for up to 10 seconds.  ?? Repeat 8 to 12 times.  Hip extension    ?? Get down on your hands and knees on the floor.  ?? Keeping your back and neck straight, lift one leg straight out behind you. When you lift your leg, keep your hips level. Don't let your back twist, and don't let your hip drop toward the floor.  ?? Hold for 6 seconds. Repeat 8 to 12 times with each leg.  ?? If you feel steady and strong when you do this exercise, you can make it more difficult. To do this, when you lift your leg, also lift the opposite arm straight out in front of you. For example, lift the left leg and the right arm at the same time. (This is sometimes called the "bird dog exercise.") Hold for 6 seconds, and repeat 8 to 12 times on each side.  Clamshell    ?? Lie on your side with a pillow under your head. Keep your feet and knees  together and your knees bent.  ?? Raise your top knee, but keep your feet together. Do not let your hips roll back. Your legs should open up like a clamshell.  ?? Hold for 6 seconds.  ?? Slowly lower your knee back down. Rest for 10 seconds.  ?? Repeat 8  to 12 times.  ?? Switch to your other side and repeat steps 1 through 5.  Hamstring wall stretch    ?? Lie on your back in a doorway, with one leg through the open door.  ?? Slide your affected leg up the wall to straighten your knee. You should feel a gentle stretch down the back of your leg.  ?? Do not arch your back.  ?? Do not bend either knee.  ?? Keep one heel touching the floor and the other heel touching the wall. Do not point your toes.  ?? Hold the stretch for at least 1 minute to begin. Then try to lengthen the time you hold the stretch to as long as 6 minutes.  ?? Switch legs, and repeat steps 1 through 3.  ?? Repeat 2 to 4 times.  If you do not have a place to do this exercise in a doorway, there is another way to do it:  ?? Lie on your back, and bend one knee.  ?? Loop a towel under the ball and toes of that foot, and hold the ends of the towel in your hands.  ?? Straighten your knee, and slowly pull back on the towel. You should feel a gentle stretch down the back of your leg.  ?? Switch legs, and repeat steps 1 through 3.  ?? Repeat 2 to 4 times.  Lower abdominal strengthening    1. Lie on your back with your knees bent and your feet flat on the floor.  2. Tighten your belly muscles by pulling your belly button in toward your spine.  3. Lift one foot off the floor and bring your knee toward your chest, so that your knee is straight above your hip and your leg is bent like the letter "L."  4. Lift the other knee up to the same position.  5. Lower one leg at a time to the starting position.  6. Keep alternating legs until you have lifted each leg 8 to 12 times.  7. Be sure to keep your belly muscles tight and your back still as you are  moving your legs. Be sure to breathe normally.  Piriformis stretch    1. Lie on your back with your legs straight.  2. Lift your affected leg, and bend your knee. With your opposite hand, reach across your body, and then gently pull your knee toward your opposite shoulder.  3. Hold the stretch for 15 to 30 seconds.  4. Switch legs and repeat steps 1 through 3.  5. Repeat 2 to 4 times.  Follow-up care is a key part of your treatment and safety. Be sure to make and go to all appointments, and call your doctor if you are having problems. It's also a good idea to know your test results and keep a list of the medicines you take.   Where can you learn more?   Go to [facility]  Enter [phone] in the search box to learn more about "Sacroiliac Pain: Exercises."   ?? 2006-2015 Healthwise, Incorporated. Care instructions adapted under license by [employer] (which disclaims liability or warranty for this information). This care instruction is for use with your licensed healthcare professional. If you have questions about a medical condition or this instruction, always ask your healthcare professional. Healthwise, Incorporated disclaims any warranty or liability for your use of this information.  Content Version: 10.5.422740; Current as of: December 18, 2013  Insulin Resistance: After Your Visit  Your Care Instructions  Insulin resistance means that the body cannot use insulin properly. Insulin lets sugar (glucose) enter the body's cells, where it is used for energy. It also helps muscles, fat, and liver cells store sugar to be released when needed. If the body tissues do not respond properly to insulin, the blood sugar level rises.  Insulin resistance mainly is caused by obesity, although other medical conditions, such as acromegaly and Cushing???s syndrome, also can cause it. It can run in families too.  Follow-up care is a key part of your treatment and safety. Be sure to make  and go to all appointments, and call your doctor if you are having problems. It???s also a good idea to know your test results and keep a list of the medicines you take.  How can you care for yourself at home?  ?? Take your medicines exactly as prescribed. Call your doctor if you think you are having a problem with your medicine. You will get more details on the specific medicines your doctor prescribes.  ?? Eat a good diet that spreads carbohydrate throughout the day.  ?? Get at least 30 minutes of exercise on most days of the week. Exercise helps control your blood sugar. It also helps you maintain a healthy weight. Walking is a good choice. You also may want to do other activities, such as running, swimming, cycling, or playing tennis or team sports.  ?? Try to lose weight. Losing even a small amount of weight can help.  ?? Do not smoke. If you need help quitting, talk to your doctor about stop-smoking programs and medicines. These can increase your chances of quitting for good.  When should you call for help?  Call 911 if you have symptoms of a heart attack. These may include:  ?? Chest pain or pressure, or a strange feeling in the chest.  ?? Sweating.  ?? Shortness of breath.  ?? Nausea or vomiting.  ?? Pain, pressure, or a strange feeling in the back, neck, jaw, or upper belly or in one or both shoulders or arms.  ?? Lightheadedness or sudden weakness.  ?? A fast or irregular heartbeat.  After calling 911, the operator may tell you to chew 1 adult-strength or 2 to 4 low-dose aspirin. Wait for an ambulance. Do not try to drive yourself.  Call 911 if you have signs of a stroke, such as:  ?? Sudden numbness, paralysis, or weakness in your face, arm, or leg, especially on only one side of your body.  ?? New problems with walking or balance.  ?? Sudden vision changes.  ?? Drooling or slurred speech.  ?? New problems speaking or understanding simple statements, or feeling confused.   ?? A sudden, severe headache that is different from past headaches.  Call your doctor now or seek immediate medical care if:  ?? You have any symptoms of diabetes. These may include:  ?? Being thirsty more often.  ?? Urinating more.  ?? Being hungrier.  ?? Losing weight.  ?? Being very tired.  ?? Having blurry vision.  ?? You have a wound that will not heal.  ?? You have an infection that will not go away.  ?? You have problems with your blood pressure.  Watch closely for changes in your health, and be sure to contact your doctor if you have any problems.   Where can you learn more?   Go to http://www.healthwise.net/[facility]  Enter V537 in the   search box to learn more about "Insulin Resistance: After Your Visit."   ?? 2006-2015 Healthwise, Incorporated. Care instructions adapted under license by [employer] [place] (which disclaims liability or warranty for this information). This care instruction is for use with your licensed healthcare professional. If you have questions about a medical condition or this instruction, always ask your healthcare professional. Healthwise, Incorporated disclaims any warranty or liability for your use of this information.  Content Version: 10.5.422740; Current as of: September 11, 2013              Diet and Exercise for Metabolic Syndrome: After Your Visit  Your Care Instructions  Metabolic syndrome is the name for a group of health problems. It includes having too much fat around your waist and high blood pressure. It also includes high triglycerides, high blood sugar, and low levels of healthy (HDL) cholesterol. These problems make it more likely you will have a heart attack or stroke or get diabetes.  Your family history (your genes) can cause metabolic syndrome. So can unhealthy eating habits and not getting enough exercise.  You can help lower your risk of heart attack, stroke, and diabetes if you eat healthy foods and get more exercise. It may be hard to make these  lifestyle changes. But even small changes can help.  Follow-up care is a key part of your treatment and safety. Be sure to make and go to all appointments, and call your doctor if you are having problems. It's also a good idea to know your test results and keep a list of the medicines you take.  How can you care for yourself at home?  Eat more fruits and vegetables  ?? Fruits and vegetables have nutrients to help protect you from heart disease and high blood pressure. They are low in fat and high in fiber. Dark green, orange, and yellow ones are the healthiest.  ?? Keep lots of vegetables ready for snacks.  ?? Buy fruit that is in season. Then put it where you can see it so you will want to eat it.  ?? Cook dishes that have a lot of vegetables. Soups and stir-fries are good choices.  Limit saturated and trans fats  ?? Read food labels, and try to avoid saturated and trans fats. They increase your risk of heart disease.  ?? Use olive or canola oil when you cook.  ?? Bake, broil, grill, or steam foods. Avoid fried foods.  ?? Limit how much high-fat meat you eat. This includes hot dogs and sausages. When you prepare meat, cut off all the fat.  ?? You can replace high-fat meat with fish and skinless poultry. You can also try products made from soybeans, like tofu. Soybeans may be very good for your heart.  ?? Eat at least 2 servings of fish a week. Fish with omega-3 fatty acids may help reduce your risk of heart attack. Salmon and sardines are good examples.  ?? Choose low-fat or fat-free milk and dairy products.  Eat foods high in fiber  ?? Foods high in fiber may reduce your cholesterol. And they may give you important vitamins and minerals. Good examples are oatmeal, cooked dried beans, brown rice, citrus fruits, and apples.  ?? Eat whole-grain breads and cereals. They have more fiber than white bread or pastries.  Limit high-sugar foods  ?? Limit foods and drinks that are high in sugar. Some examples are soda  pop, sugar-sweetened fruit drinks, candy, and many desserts.  ?? Limit the amount  of sugar, honey, and other sweeteners that you add to food and drinks.  ?? Choose water instead of soda pop or other sugar-sweetened drinks.  ?? Limit fruit juice.  Limit salt and sodium  ?? You can help lower your blood pressure if you limit salt and sodium.  ?? If you take the salt shaker off the table, it may be easier to use less salt. You can also try using half the salt in a recipe. And you can avoid adding salt to cooking water for pasta, rice, and potatoes.  ?? Try to eat fewer snacks, fast foods, and other high-salt, processed foods. Check labels so you know how much sodium a food has.  ?? Choose canned goods (soups, vegetables, and beans) that are low in sodium.  Get regular exercise  ?? Get more exercise. Make sure your doctor knows when you start a new exercise program. Even small amounts of exercise will help you get stronger and have more energy. It can also help you manage your weight and your stress.  ?? Walking is a good choice. Little by little, increase the amount you walk every day. Try for at least 30 minutes on most days of the week.   Where can you learn more?   Go to [email or web address]huhttp://www.healthwise.net/[facility]  Enter H930 in the search box to learn more about "Diet and Exercise for Metabolic Syndrome: After Your Visit."   ?? 2006-2015 Healthwise, Incorporated. Care instructions adapted under license by [employer] [place] (which disclaims liability or warranty for this information). This care instruction is for use with your licensed healthcare professional. If you have questions about a medical condition or this instruction, always ask your healthcare professional. Healthwise, Incorporated disclaims any warranty or liability for your use of this information.  Content Version: 10.5.422740; Current as of: September 11, 2013

## 2014-08-23 NOTE — Telephone Encounter (Signed)
Noted. kkb

## 2014-08-23 NOTE — Telephone Encounter (Signed)
Pt coming in this week. He has not scheduled his MRI yet but will and he plans to let us know the date when he comes in this week for BP check.

## 2014-08-23 NOTE — Telephone Encounter (Signed)
-----   Message from [name], NP sent at 08/23/2014  2:51 AM EDT -----  Regarding: Needs BP check  Please have him come by for a BP check sometime this week. Ask him when his L-spine MRI is scheduled for too please. kkb

## 2014-08-23 NOTE — Progress Notes (Signed)
HISTORY OF PRESENT ILLNESS  Barton DuboisMichael L Okey is a 58 y.o. male.  HPI The patient is a 58 y.o. male who is seen for follow up of HTN, Chronic Left Hip Pain and Chronic Low Back Pain that radiates down his LLE.  Symptoms are symptoms have progressed to a point and plateaued. The patient is following treatment regimen with good compliance, patient is feeling worse, he is at his wit's end with his chronic low back pain that radiates down his LLE and his chronic left hip pain and he is ready to see a specialist get his pain treated, no reported side effects of regimen.  He  is not following an appropriate diet and is not exercising regularly.         Review of Systems   Constitutional: Positive for malaise/fatigue and diaphoresis (excessive sweating). Negative for weight loss.   HENT: Negative for congestion.    Eyes: Negative for blurred vision and pain.   Respiratory: Negative for cough and shortness of breath.         Snores very loudly according to his wife.   Cardiovascular: Negative for chest pain, palpitations and leg swelling.   Gastrointestinal: Negative for heartburn, nausea, vomiting, abdominal pain, diarrhea, constipation, blood in stool and melena.   Genitourinary: Negative for dysuria, urgency and frequency.   Musculoskeletal: Positive for myalgias, back pain (Chronic low back pain that radiates down his LLE) and joint pain (chronic left hip pain).   Skin: Negative for itching and rash.   Neurological: Negative for dizziness, sensory change, focal weakness, weakness and headaches.   Endo/Heme/Allergies: Negative for environmental allergies and polydipsia.   Psychiatric/Behavioral: Negative for depression, suicidal ideas and substance abuse. The patient is nervous/anxious (He has not had any panic attacks since his last visit.). The patient does not have insomnia.    All other systems reviewed and are negative.        BP 168/85 mmHg   Pulse 83   Temp(Src) 98 ??F (36.7 ??C) (Oral)   Ht 5\' 11"   (1.803 m)   Wt 282 lb (127.914 kg)   BMI 39.35 kg/m2   SpO2 96%  He has gained 6 lb since 6/15.        Physical Exam   Constitutional: He is oriented to person, place, and time. Vital signs are normal. He appears well-developed and well-nourished. He does not appear ill (but he looks exhausted). No distress.   HENT:   Head: Normocephalic and atraumatic.   Eyes: Conjunctivae are normal. No scleral icterus.   Neck: Trachea normal, normal range of motion and phonation normal. Neck supple. No JVD present. No thyromegaly present.   Cardiovascular: Normal rate, regular rhythm, normal heart sounds, intact distal pulses and normal pulses.    No peripheral edema noted.   Pulmonary/Chest: Effort normal and breath sounds normal.   Abdominal: Soft. Normal appearance and bowel sounds are normal. There is no hepatosplenomegaly. There is no tenderness. There is no CVA tenderness.   Musculoskeletal:        Left hip: He exhibits decreased strength. He exhibits normal range of motion, no tenderness, no bony tenderness, no swelling, no crepitus, no deformity and no laceration.        Lumbar back: He exhibits decreased range of motion, tenderness, bony tenderness (Left S-I joint) and pain (primarily in his left low back and it radiates down his LLE). He exhibits no edema, no deformity, no spasm and normal pulse.   Neurological: He is alert and oriented  to person, place, and time. He displays no tremor. No cranial nerve deficit or sensory deficit. He exhibits normal muscle tone. Gait (he ambulates slowly leaning forward with an antalgic gait d/t his chronic low back pain that radiates down his LLE ) abnormal.   Skin: Skin is warm and dry. No rash noted.   Psychiatric: His speech is normal and behavior is normal. Judgment and thought content normal. His mood appears anxious (and irritable). His affect is not angry, not blunt, not labile and not inappropriate. Cognition and memory are normal. He exhibits a depressed mood.    Nursing note and vitals reviewed.      ASSESSMENT and PLAN    ICD-10-CM ICD-9-CM    1. Essential hypertension, benign I10 401.1 nebivolol (BYSTOLIC) 10 mg tablet   2. Hip pain, left M25.552 719.45 XR HIP LT AP/LAT MIN 2 V      REFERRAL TO ORTHOPEDIC SURGERY      CANCELED: REFERRAL TO ORTHOPEDIC SURGERY   3. 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 Left lumbar radiculitis M54.16 724.4 MRI LUMB SPINE WO CONT   I believe that his BP is elevated today because he is in severe pain so I will have him stop by one day next week for a BP recheck. I have ordered an MRI of his L-spine to see if he has an HNP that is causing his chronic LBP that radiates down his LLE. His left hip x-rays showed no fracture or dislocation but he has very decreased joint space so I have referred him to an Ortho MD for f/u care of his left hip pain. He does not want an Rx for any pain meds. He will RTO prn.

## 2014-09-06 MED ORDER — PHENTERMINE 37.5 MG TAB
37.5 mg | ORAL_TABLET | ORAL | Status: DC
Start: 2014-09-06 — End: 2014-11-24

## 2014-09-07 NOTE — Progress Notes (Signed)
Pt came in for a BP check.

## 2014-09-10 ENCOUNTER — Inpatient Hospital Stay: Admit: 2014-09-10 | Payer: PRIVATE HEALTH INSURANCE | Attending: Family Medicine | Primary: Family Medicine

## 2014-09-10 DIAGNOSIS — M533 Sacrococcygeal disorders, not elsewhere classified: Secondary | ICD-10-CM

## 2014-09-11 ENCOUNTER — Encounter

## 2014-09-11 NOTE — Progress Notes (Signed)
L-spine MRI abnormal so I ordered a Neurosurgery referral for him. kkb

## 2014-09-12 NOTE — Progress Notes (Signed)
I talked to him while he was here and told him that 130/80 is excellent and that he should continue taking his current meds. kkb

## 2014-09-13 NOTE — Telephone Encounter (Signed)
Pt notified by VM.

## 2014-09-13 NOTE — Telephone Encounter (Signed)
Please call him and let him know that I don't order the MRIs before people go to [facility] just the X-rays because there are times when an MRI is not needed. kkb

## 2014-09-13 NOTE — Telephone Encounter (Signed)
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     Curious no hip info, [facility] wanted to schedule my visit after MRI & bring x-ray CD I have of hip.    I thought MRI would be of hip area also?    Thank you again!    -M

## 2014-10-28 NOTE — Telephone Encounter (Signed)
Pt reports he rec'd a letter from [facility] re: Testosterone and Phentermine. Pt reports he is due for Testosterone injection on Tuesday.

## 2014-11-06 ENCOUNTER — Encounter

## 2014-11-09 NOTE — Telephone Encounter (Signed)
Are you planning to give him a one month Rx?

## 2014-11-09 NOTE — Telephone Encounter (Signed)
I just called in the Rx for him. kkb

## 2014-11-10 MED ORDER — TESTOSTERONE CYPIONATE 200 MG/ML IM OIL
200 mg/mL | INTRAMUSCULAR | Status: DC
Start: 2014-11-10 — End: 2014-12-17

## 2014-11-15 ENCOUNTER — Ambulatory Visit
Admit: 2014-11-15 | Discharge: 2014-11-15 | Payer: PRIVATE HEALTH INSURANCE | Attending: Family Medicine | Primary: Family Medicine

## 2014-11-15 DIAGNOSIS — E7800 Pure hypercholesterolemia, unspecified: Secondary | ICD-10-CM

## 2014-11-16 ENCOUNTER — Other Ambulatory Visit: Admit: 2014-11-16 | Discharge: 2014-11-16 | Payer: PRIVATE HEALTH INSURANCE | Primary: Family Medicine

## 2014-11-16 LAB — AMB POC URINALYSIS DIP STICK MANUAL W/ MICRO
Bilirubin (UA POC): NEGATIVE
Blood (UA POC): NEGATIVE
Glucose (UA POC): NEGATIVE
Ketones (UA POC): NEGATIVE
Leukocyte esterase (UA POC): NEGATIVE
Nitrites (UA POC): NEGATIVE
Protein (UA POC): NEGATIVE mg/dL
Specific gravity (UA POC): 1.01 (ref 1.001–1.035)
Urobilinogen (UA POC): 0.2 (ref 0.2–1)
pH (UA POC): 5 (ref 4.6–8.0)

## 2014-11-16 LAB — AMB POC COMPLETE CBC,AUTOMATED ENTER
ABS. GRANS (POC): 4.5 10*3/uL (ref 1.4–6.5)
ABS. LYMPHS (POC): 1.8 10*3/uL (ref 1.2–3.4)
ABS. MONOS (POC): 0.6 10*3/uL (ref 0.1–0.6)
GRANULOCYTES (POC): 65.5 % (ref 42.2–75.2)
HCT (POC): 58.9 % (ref 35–60)
HGB (POC): 19.5 g/dL — AB (ref 11–18)
LYMPHOCYTES (POC): 25.5 % (ref 20.5–51.1)
MCH (POC): 30.1 pg (ref 27–31)
MCHC (POC): 33 g/dL (ref 33–37)
MCV (POC): 91 fL (ref 80–99.9)
MONOCYTES (POC): 9 % (ref 1.7–9.3)
MPV (POC): 8.4 fL (ref 7.8–11)
PLATELET (POC): 160 10*3/uL (ref 150–450)
RBC (POC): 6.48 M/uL — AB (ref 4–6)
RDW (POC): 15.6 % — AB (ref 11.6–13.7)
WBC (POC): 6.9 10*3/uL (ref 4.5–10.5)

## 2014-11-17 LAB — TSH RFX ON ABNORMAL TO FREE T4: TSH: 3.39 u[IU]/mL (ref 0.450–4.500)

## 2014-11-17 LAB — METABOLIC PANEL, COMPREHENSIVE
A-G Ratio: 1.7 (ref 1.1–2.5)
ALT (SGPT): 45 IU/L — ABNORMAL HIGH (ref 0–44)
AST (SGOT): 33 IU/L (ref 0–40)
Albumin: 4.3 g/dL (ref 3.5–5.5)
Alk. phosphatase: 69 IU/L (ref 39–117)
BUN/Creatinine ratio: 15 (ref 9–20)
BUN: 19 mg/dL (ref 6–24)
Bilirubin, total: 0.3 mg/dL (ref 0.0–1.2)
CO2: 24 mmol/L (ref 18–29)
Calcium: 9.6 mg/dL (ref 8.7–10.2)
Chloride: 100 mmol/L (ref 97–108)
Creatinine: 1.24 mg/dL (ref 0.76–1.27)
GFR est AA: 74 mL/min/{1.73_m2} (ref >59)
GFR est non-AA: 64 mL/min/{1.73_m2} (ref >59)
GLOBULIN, TOTAL: 2.5 g/dL (ref 1.5–4.5)
Glucose: 124 mg/dL — ABNORMAL HIGH (ref 65–99)
Potassium: 4.8 mmol/L (ref 3.5–5.2)
Protein, total: 6.8 g/dL (ref 6.0–8.5)
Sodium: 142 mmol/L (ref 134–144)

## 2014-11-17 LAB — TESTOSTERONE, TOTAL, ADULT MALE: Testosterone: 675 ng/dL (ref 348–1197)

## 2014-11-17 LAB — VITAMIN D, 25 HYDROXY: VITAMIN D, 25-HYDROXY: 16.3 ng/mL — ABNORMAL LOW (ref 30.0–100.0)

## 2014-11-17 LAB — LIPID PANEL
Cholesterol, total: 197 mg/dL (ref 100–199)
HDL Cholesterol: 31 mg/dL — ABNORMAL LOW (ref >39)
LDL, calculated: 117 mg/dL — ABNORMAL HIGH (ref 0–99)
Triglyceride: 243 mg/dL — ABNORMAL HIGH (ref 0–149)
VLDL, calculated: 49 mg/dL — ABNORMAL HIGH (ref 5–40)

## 2014-11-17 LAB — TESTOSTERONE, FREE: Free testosterone (Direct): 25.7 pg/mL — ABNORMAL HIGH (ref 7.2–24.0)

## 2014-11-17 LAB — HEMOGLOBIN A1C WITH EAG: Hemoglobin A1c: 6.1 % — ABNORMAL HIGH (ref 4.8–5.6)

## 2014-11-17 MED ORDER — METFORMIN 1,000 MG TAB
1000 mg | ORAL_TABLET | ORAL | Status: DC
Start: 2014-11-17 — End: 2015-04-28

## 2014-11-17 NOTE — Telephone Encounter (Signed)
Please sent to Pharmacy   Thanks!

## 2014-11-18 MED ORDER — ERGOCALCIFEROL (VITAMIN D2) 50,000 UNIT CAP
1250 mcg (50,000 unit) | ORAL_CAPSULE | ORAL | Status: DC
Start: 2014-11-18 — End: 2015-04-28

## 2014-11-18 NOTE — Telephone Encounter (Signed)
Pt updated as noted below.  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  I scheduled pt for 01/27/15 for f/u and fasting labs.

## 2014-11-18 NOTE — Telephone Encounter (Signed)
-----   Message from [name], NP sent at 11/17/2014  9:05 PM EST -----  Please call him and let him know that all of his labs look ok except his Glucose, AST and ALT are mildly elevated, his TGs are better at 243 (goal is < 150), his LDL went up to 117 (goal is< 100), his HgbA1C is stable at 6.1 and his Vit D is very decreased again at 16.3. Everything except his low Vit D level is, most likely, due to him not following his low glycemic index, high protein, low cholesterol diet and not exercising regularly. I am not going to increase any of his current meds but he must STRICTLY follow a low glycemic index, high protein, low cholesterol diet and start getting 30 minutes of CV exercise 4-5 days a week. I am going to restart him on ergocalciferol q week for the low Vit D level. I will send in that Rx for him. Also, his Free Testosterone level was mildly elevated and his Total Testosterone level was at goal. His Hgb is still mildly elevated so he needs to be taking an OTC Aspirin 81 mg daily. He will need to RTO in 3 months for a recheck and repeat fasting  labs. Please make that appt for him. kkb

## 2014-11-21 MED ORDER — METOPROLOL SUCCINATE SR 50 MG 24 HR TAB
50 mg | ORAL_TABLET | Freq: Every evening | ORAL | Status: DC
Start: 2014-11-21 — End: 2015-04-28

## 2014-11-21 MED ORDER — PRAVASTATIN 40 MG TAB
40 mg | ORAL_TABLET | ORAL | Status: DC
Start: 2014-11-21 — End: 2015-04-28

## 2014-11-21 NOTE — Progress Notes (Signed)
HISTORY OF PRESENT ILLNESS  Steven [name] is a 59 y.o. male.  HPI The patient is a 59 y.o. male who is seen for follow up of Hyperlipidemia, HTN, Prediabetes, Vit D Deficiency and Hypogonadism.  Symptoms are symptoms have progressed to a point and plateaued..  The patient is having difficulty following treatment regimen, patient is about the same, no reported side effects of regimen.  He  is not following an appropriate diet and is not exercising regularly.     Since his last visit he has been seen by an Ortho MD who told him that his chronic left hip pain is not due to arthritis in his hip it is due to Lumbago with left sciatica, DDD and a number of other abnormalities that he has in his low back. Steven [name] has an appointment an appt coming up soon with a Neurosurgeon for a consult on his chronic back pain and the results of his recent L-S spine MRI.         Review of Systems   Constitutional: Positive for malaise/fatigue and diaphoresis (excessive sweating). Negative for weight loss.   HENT: Negative for congestion.    Eyes: Negative for blurred vision and pain.   Respiratory: Negative for cough and shortness of breath.    Cardiovascular: Negative for chest pain, palpitations and leg swelling.   Gastrointestinal: Negative for heartburn, nausea, vomiting, abdominal pain, diarrhea, constipation, blood in stool and melena.   Genitourinary: Negative for dysuria, urgency and frequency.   Musculoskeletal: Positive for myalgias, back pain (Chronic low back pain that radiates down his LLE) and joint pain (chronic left hip pain).   Skin: Negative for itching and rash.   Neurological: Negative for dizziness, sensory change, focal weakness, weakness and headaches.   Endo/Heme/Allergies: Negative for environmental allergies and polydipsia.   Psychiatric/Behavioral: Negative for depression, suicidal ideas and substance abuse. The patient is nervous/anxious (He has not had any panic  attacks since his last visit.). The patient does not have insomnia.    All other systems reviewed and are negative.        BP 158/85 mmHg   Pulse 74   Temp(Src) 97.7 ??F (36.5 ??C) (Oral)   Ht 5\' 11"  (1.803 m)   Wt 285 lb (129.275 kg)   BMI 39.77 kg/m2   SpO2 95%  He has gained 9 lb since 6/15 and 26 lb since 9/13.      Physical Exam   Constitutional: He is oriented to person, place, and time. He appears well-developed and well-nourished. He does not appear ill. No distress.   HENT:   Head: Normocephalic and atraumatic.   Eyes: Conjunctivae are normal. No scleral icterus.   Neck: Normal range of motion and phonation normal. Neck supple. No JVD present. No thyromegaly present.   Cardiovascular: Normal rate, regular rhythm, normal heart sounds, intact distal pulses and normal pulses.    No peripheral edema noted.   Pulmonary/Chest: Effort normal and breath sounds normal.   Neurological: He is alert and oriented to person, place, and time. He displays no tremor. No cranial nerve deficit or sensory deficit. He exhibits normal muscle tone. Gait (he ambulates slowly leaning forward with an antalgic gait d/t his chronic low back pain that radiates down his LLE ) abnormal.   Skin: Skin is warm and dry. No rash noted.   Psychiatric: His speech is normal and behavior is normal. Judgment and thought content normal. His mood appears anxious (stable). His affect is not angry, not blunt, not  labile and not inappropriate. Cognition and memory are normal. He exhibits a depressed mood (stable).   Nursing note and vitals reviewed.      ASSESSMENT and PLAN    ICD-10-CM ICD-9-CM    1. Pure hypercholesterolemia E78.0 272.0 METABOLIC PANEL, COMPREHENSIVE      LIPID PANEL      pravastatin (PRAVACHOL) 40 mg tablet   2. Pre-diabetes R73.09 790.29 HEMOGLOBIN A1C      metFORMIN (GLUCOPHAGE) 1,000 mg tablet   3. Essential hypertension, benign I10 401.1 AMB POC COMPLETE CBC,AUTOMATED ENTER      AMB POC URINALYSIS DIP STICK MANUAL W/ MICRO       TSH RFX ON ABNORMAL TO FREE T4      Continue Valsartan/HCT 320/12.5 mg qd  metoprolol succinate (TOPROL-XL) 50 mg XL tablet   4. Vitamin D deficiency E55.9 268.9 VITAMIN D, 25 HYDROXY      ergocalciferol (ERGOCALCIFEROL) 50,000 unit capsule   5. 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 Obesity, morbid (HCC) E66.01 278.01 REFERRAL TO BARIATRIC SURGERY   Labs checked and meds refilled as he is feeling fairly well on his current regimen. I have added Metoprolol XL 50 mg po qhs to his regimen as his BP is not at goal. He will call for refills when he needs them. He was encouraged to strictly follow a low cholesterol, low glycemic index, high protein diet and to try to get 30 minutes of some sort of CV exercise 4-5 days a week. I talked to him at length about considering having the Gastric Sleeve surgery in order to help him lose weight. I showed him pictures and explained the procedure to him. He is between a rock and a hard place right now because he needs to lose weight to help decrease his back pain but he cannot exercise to help him lose weight because of his severe back pain. I recommended that he go to the information session about the Gastric Sleeve Surgery to see if it is something he would like to pursue. He is amenable to this plan so I ordered the Bariatric Surgery consult for him. He will RTO in 6 months for a recheck and repeat fasting labs or sooner prn.

## 2014-11-21 NOTE — Patient Instructions (Signed)
Insulin Resistance: Care Instructions  Your Care Instructions  Insulin resistance means that the body cannot use insulin properly. Insulin lets sugar (glucose) enter the body's cells, where it is used for energy. It also helps muscles, fat, and liver cells store sugar to be released when needed. If the body tissues do not respond properly to insulin, the blood sugar level rises.  Insulin resistance mainly is caused by obesity, although other medical conditions, such as acromegaly and Cushing's syndrome, also can cause it. It can run in families too.  Follow-up care is a key part of your treatment and safety. Be sure to make and go to all appointments, and call your doctor if you are having problems. It's also a good idea to know your test results and keep a list of the medicines you take.  How can you care for yourself at home?  ?? Take your medicines exactly as prescribed. Call your doctor if you think you are having a problem with your medicine. You will get more details on the specific medicines your doctor prescribes.  ?? Eat a good diet that spreads carbohydrate throughout the day.  ?? Get at least 30 minutes of exercise on most days of the week. Exercise helps control your blood sugar. It also helps you maintain a healthy weight. Walking is a good choice. You also may want to do other activities, such as running, swimming, cycling, or playing tennis or team sports.  ?? Try to lose weight. Losing even a small amount of weight can help.  ?? Do not smoke. If you need help quitting, talk to your doctor about stop-smoking programs and medicines. These can increase your chances of quitting for good.  When should you call for help?  Call 911 anytime you think you may need emergency care. For example, call if:  ?? You have symptoms of a heart attack. These may include:  ?? Chest pain or pressure, or a strange feeling in the chest.  ?? Sweating.  ?? Shortness of breath.   ?? Pain, pressure, or a strange feeling in the back, neck, jaw, or upper belly or in one or both shoulders or arms.  ?? Lightheadedness or sudden weakness.  ?? A fast or irregular heartbeat.  After you call 911, the operator may tell you to chew 1 adult-strength or 2 to 4 low-dose aspirin. Wait for an ambulance. Do not try to drive yourself.  ?? You have symptoms of a stroke. These may include:  ?? Sudden numbness, tingling, weakness, or loss of movement in your face, arm, or leg, especially on only one side of your body.  ?? Sudden vision changes.  ?? Sudden trouble speaking.  ?? Sudden confusion or trouble understanding simple statements.  ?? Sudden problems with walking or balance.  ?? A sudden, severe headache that is different from past headaches.  Call your doctor now or seek immediate medical care if:  ?? You have any symptoms of diabetes. These may include:  ?? Being thirsty more often.  ?? Urinating more.  ?? Being hungrier.  ?? Losing weight.  ?? Being very tired.  ?? Having blurry vision.  ?? You have a wound that will not heal.  ?? You have an infection that will not go away.  ?? You have problems with your blood pressure.  Watch closely for changes in your health, and be sure to contact your doctor if you have any problems.   Where can you learn more?   Go to http://www.healthwise.net/[facility]    Enter V537 in the search box to learn more about "Insulin Resistance: Care Instructions."   ?? 2006-2015 Healthwise, Incorporated. Care instructions adapted under license by [place] (which disclaims liability or warranty for this information). This care instruction is for use with your licensed healthcare professional. If you have questions about a medical condition or this instruction, always ask your healthcare professional. Healthwise, Incorporated disclaims any warranty or liability for your use of this information.  Content Version: 10.7.482551; Current as of: June 07, 2014               Diet and Exercise for Metabolic Syndrome: Care Instructions  Your Care Instructions  Metabolic syndrome is the name for a group of health problems. It includes having too much fat around your waist and high blood pressure. It also includes high triglycerides, high blood sugar, and low levels of healthy (HDL) cholesterol. These problems make it more likely you will have a heart attack or stroke or get diabetes.  Your family history (your genes) can cause metabolic syndrome. So can unhealthy eating habits and not getting enough exercise.  You can help lower your risk of heart attack, stroke, and diabetes if you eat healthy foods and get more exercise. It may be hard to make these lifestyle changes. But even small changes can help.  Follow-up care is a key part of your treatment and safety. Be sure to make and go to all appointments, and call your doctor if you are having problems. It's also a good idea to know your test results and keep a list of the medicines you take.  How can you care for yourself at home?  Eat more fruits and vegetables  ?? Fruits and vegetables have nutrients to help protect you from heart disease and high blood pressure. They are low in fat and high in fiber. Dark green, orange, and yellow ones are the healthiest.  ?? Keep lots of vegetables ready for snacks.  ?? Buy fruit that is in season. Then put it where you can see it so you will want to eat it.  ?? Cook dishes that have a lot of vegetables. Soups and stir-fries are good choices.  Limit saturated and trans fats  ?? Read food labels, and try to avoid saturated and trans fats. They increase your risk of heart disease.  ?? Use olive or canola oil when you cook.  ?? Bake, broil, grill, or steam foods. Avoid fried foods.  ?? Limit how much high-fat meat you eat. This includes hot dogs and sausages. When you prepare meat, cut off all the fat.  ?? You can replace high-fat meat with fish and skinless poultry. You can  also try products made from soybeans, like tofu. Soybeans may be very good for your heart.  ?? Eat at least 2 servings of fish a week. Fish with omega-3 fatty acids may help reduce your risk of heart attack. Salmon and sardines are good examples.  ?? Choose low-fat or fat-free milk and dairy products.  Eat foods high in fiber  ?? Foods high in fiber may reduce your cholesterol. And they may give you important vitamins and minerals. Good examples are oatmeal, cooked dried beans, brown rice, citrus fruits, and apples.  ?? Eat whole-grain breads and cereals. They have more fiber than white bread or pastries.  Limit high-sugar foods  ?? Limit foods and drinks that are high in sugar. Some examples are soda pop, sugar-sweetened fruit drinks, candy, and many desserts.  ?? Limit   the amount of sugar, honey, and other sweeteners that you add to food and drinks.  ?? Choose water instead of soda pop or other sugar-sweetened drinks.  ?? Limit fruit juice.  Limit salt and sodium  ?? You can help lower your blood pressure if you limit salt and sodium.  ?? If you take the salt shaker off the table, it may be easier to use less salt. You can also try using half the salt in a recipe. And you can avoid adding salt to cooking water for pasta, rice, and potatoes.  ?? Try to eat fewer snacks, fast foods, and other high-salt, processed foods. Check labels so you know how much sodium a food has.  ?? Choose canned goods (soups, vegetables, and beans) that are low in sodium.  Get regular exercise  ?? Get more exercise. Make sure your doctor knows when you start a new exercise program. Even small amounts of exercise will help you get stronger and have more energy. It can also help you manage your weight and your stress.  ?? Walking is a good choice. Little by little, increase the amount you walk every day. Try for at least 30 minutes on most days of the week.   Where can you learn more?   Go to http://www.healthwise.net/[facility]   Enter H930 in the search box to learn more about "Diet and Exercise for Metabolic Syndrome: Care Instructions."   ?? 2006-2015 Healthwise, Incorporated. Care instructions adapted under license by [place] (which disclaims liability or warranty for this information). This care instruction is for use with your licensed healthcare professional. If you have questions about a medical condition or this instruction, always ask your healthcare professional. Healthwise, Incorporated disclaims any warranty or liability for your use of this information.  Content Version: 10.7.482551; Current as of: September 11, 2013              High Cholesterol: Care Instructions  Your Care Instructions  Cholesterol is a type of fat in your blood. It is needed for many body functions, such as making new cells. Cholesterol is made by your body. It also comes from food you eat. High cholesterol means that you have too much of the fat in your blood. This raises your risk of a heart attack and stroke.  LDL and HDL are part of your total cholesterol. LDL is the "bad" cholesterol. High LDL can raise your risk for heart disease, heart attack, and stroke. HDL is the "good" cholesterol. It helps clear bad cholesterol from the body. High HDL is linked with a lower risk of heart disease, heart attack, and stroke.  Your cholesterol levels help your doctor find out your risk for having a heart attack or stroke. You and your doctor can talk about whether you need to lower your risk and what treatment is best for you.  A heart-healthy lifestyle along with medicines can help lower your cholesterol and your risk. The way you choose to lower your risk will depend on how high your risk is for heart attack and stroke. It will also depend on how you feel about taking medicines.  Follow-up care is a key part of your treatment and safety. Be sure to make and go to all appointments, and call your doctor if you are having  problems. It's also a good idea to know your test results and keep a list of the medicines you take.  How can you care for yourself at home?  ?? Eat a variety   of foods every day. Good choices include fruits, vegetables, whole grains (like oatmeal), dried beans and peas, nuts and seeds, soy products (like tofu), and fat-free or low-fat dairy products.  ?? Replace butter, margarine, and hydrogenated or partially hydrogenated oils with olive and canola oils. (Canola oil margarine without trans fat is fine.)  ?? Replace red meat with fish, poultry, and soy protein (like tofu).  ?? Limit processed and packaged foods like chips, crackers, and cookies.  ?? Bake, broil, or steam foods. Don't fry them.  ?? Limit foods high in cholesterol. These include egg yolks.  ?? Be physically active. Get at least 30 minutes of exercise on most days of the week. Walking is a good choice. You also may want to do other activities, such as running, swimming, cycling, or playing tennis or team sports.  ?? Stay at a healthy weight or lose weight by making the changes in eating and physical activity listed above. Losing just a small amount of weight, even 5 to 10 pounds, can reduce your risk for having a heart attack or stroke.  ?? Do not smoke.  When should you call for help?  Watch closely for changes in your health, and be sure to contact your doctor if:  ?? You need help making lifestyle changes.  ?? You have questions about your medicine.   Where can you learn more?   Go to http://www.healthwise.net/[facility]  Enter I865 in the search box to learn more about "High Cholesterol: Care Instructions."   ?? 2006-2015 Healthwise, Incorporated. Care instructions adapted under license by [place] (which disclaims liability or warranty for this information). This care instruction is for use with your licensed healthcare professional. If you have questions about a medical condition or this instruction, always ask your healthcare professional. Healthwise,  Incorporated disclaims any warranty or liability for your use of this information.  Content Version: 10.7.482551; Current as of: December 18, 2013              Learning About High Blood Pressure  What is high blood pressure?     Blood pressure is a measure of how hard the blood pushes against the walls of your arteries. It's normal for blood pressure to go up and down throughout the day, but if it stays up, you have high blood pressure. Another name for high blood pressure is hypertension.  Two numbers tell you your blood pressure. The first number is the systolic pressure. It shows how hard the blood pushes when your heart is pumping. The second number is the diastolic pressure. It shows how hard the blood pushes between heartbeats, when your heart is relaxed and filling with blood.  A blood pressure of less than 120/80 (say "120 over 80") is ideal for an adult. High blood pressure is 140/90 or higher. Many people fall into the category in between, called prehypertension. People with prehypertension need to make lifestyle changes to bring their blood pressure down and help prevent or delay high blood pressure.  What happens when you have high blood pressure?  ?? Blood flows through your arteries with too much force. Over time, this damages the walls of your arteries. But you can't feel it. High blood pressure usually doesn't cause symptoms.  ?? Fat and calcium start to build up in your arteries. This buildup is called plaque. Plaque makes your arteries narrower and stiffer. Blood can't flow through them as easily.  ?? This lack of good blood flow starts to damage some of the   organs in your body. This can lead to problems such as coronary artery disease and heart attack, heart failure, stroke, kidney failure, and eye damage.  How can you prevent high blood pressure?  ?? Stay at a healthy weight.  ?? Try to limit how much sodium you eat to less than 2,300 milligrams (mg)  a day. And try to limit the sodium you eat to less than 1,500 mg a day if you are 51 or older, are black, or have high blood pressure, diabetes, or chronic kidney disease.  ?? Buy foods that are labeled "unsalted," "sodium-free," or "low-sodium." Foods labeled "reduced-sodium" and "light sodium" may still have too much sodium.  ?? Flavor your food with garlic, lemon juice, onion, vinegar, herbs, and spices instead of salt. Do not use soy sauce, steak sauce, onion salt, garlic salt, mustard, or ketchup on your food.  ?? Use less salt (or none) when recipes call for it. You can often use half the salt a recipe calls for without losing flavor.  ?? Be physically active. Get at least 30 minutes of exercise on most days of the week. Walking is a good choice. You also may want to do other activities, such as running, swimming, cycling, or playing tennis or team sports.  ?? Limit alcohol to 2 drinks a day for men and 1 drink a day for women.  ?? Eat plenty of fruits, vegetables, and low-fat dairy products. Eat less saturated and total fats.  How is high blood pressure treated?  ?? Your doctor will suggest making lifestyle changes. For example, your doctor may ask you to eat healthy foods, quit smoking, lose extra weight, and be more active.  ?? If lifestyle changes don't help enough or your blood pressure is very high, you will have to take medicine every day.  Follow-up care is a key part of your treatment and safety. Be sure to make and go to all appointments, and call your doctor if you are having problems. It's also a good idea to know your test results and keep a list of the medicines you take.   Where can you learn more?   Go to [email or web address]  Enter P501 in the search box to learn more about "Learning About High Blood Pressure."   ?? 2006-2015 Healthwise, Incorporated. Care instructions adapted under license by [employer] (which disclaims liability or warranty for this  information). This care instruction is for use with your licensed healthcare professional. If you have questions about a medical condition or this instruction, always ask your healthcare professional. Healthwise, Incorporated disclaims any warranty or liability for your use of this information.  Content Version: 10.7.482551; Current as of: December 18, 2013              Hypogonadism: Care Instructions  Your Care Instructions  Men who have hypogonadism do not make enough testosterone. This hormone allows men to make sperm and to have normal physical male traits. The condition also is known as testosterone deficiency. It can lead to loss of sex drive, weakness, impotence, infertility, and weakened bones.  Many things can cause this condition. Causes include injured testicles, certain medicines, an infection, and aging. Having a long-term health problem such as kidney or liver disease or being obese can cause it. So can surgery or radiation treatment for another health problem. It also can be present at birth. It is most often treated with testosterone hormone. You can get the hormone as a shot or through a patch or gel  on the skin.  Follow-up care is a key part of your treatment and safety. Be sure to make and go to all appointments, and call your doctor if you are having problems. It's also a good idea to know your test results and keep a list of the medicines you take.  How can you care for yourself at home?  ?? Take your medicines exactly as prescribed. Call your doctor if you think you are having a problem with your medicine. You will get more details on the specific medicines your doctor prescribes.  ?? Follow your treatment plan. If you use testosterone hormones, follow your doctor's instructions. Hormones can help relieve many of the effects of this condition, such as impotence. But it may take weeks or months for your symptoms to improve.  ?? Get plenty of exercise. And make sure to get plenty of calcium and  vitamin D in your diet. Eat more dairy foods and green vegetables. They can help keep your bones from getting weak.  ?? If you have a hard time dealing with this condition, talk to your doctor about joining a support group. Talking with others who have the same problems can help you cope.  When should you call for help?  Call your doctor now or seek immediate medical care if:  ?? You have headaches.  ?? You have problems with your vision.  Watch closely for changes in your health, and be sure to contact your doctor if:  ?? You have trouble getting or keeping an erection.  ?? You have a loss of body hair.  ?? You feel weak or tired a lot of the time.  ?? Your breasts are getting larger.  ?? You do not get better as expected.   Where can you learn more?   Go to [email or web address]  Enter V434 in the search box to learn more about "Hypogonadism: Care Instructions."   ?? 2006-2015 Healthwise, Incorporated. Care instructions adapted under license by [employer] [place] [employer] (which disclaims liability or warranty for this information). This care instruction is for use with your licensed healthcare professional. If you have questions about a medical condition or this instruction, always ask your healthcare professional. Healthwise, Incorporated disclaims any warranty or liability for your use of this information.  Content Version: 10.7.482551; Current as of: January 15, 2014              Learning About Vitamin D  Why is it important to get enough vitamin D?  Your body needs vitamin D to absorb calcium. Calcium keeps your bones and muscles, including your heart, healthy and strong. If your muscles don't get enough calcium, they can cramp, hurt, or feel weak. You may have long-term (chronic) muscle aches and pains.  If you don't get enough vitamin D throughout life, you have an increased chance of having thin and brittle bones (osteoporosis) in your later years. Children who don't get enough vitamin D may not grow as much as  others their age. They also have a chance of getting a rare disease called rickets. It causes weak bones.  Vitamin D and calcium are added to many foods. And your body uses sunshine to make its own vitamin D.  How much vitamin D do you need?  The Institute of Medicine recommends that people ages 1 through 2170 get 600 IU (international units) every day. Adults 71 and older need 800 IU every day.  Blood tests for vitamin D can check your vitamin D level. But there is  no standard normal range used by all laboratories. The Institute of Medicine recommends a blood level of 20 ng/mL of vitamin D for healthy bones. And most people in the [place] and [place] meet this goal.  How can you get more vitamin D?  Foods that contain vitamin D include:  ?? Salmon, tuna, and mackerel. These are some of the best foods to eat when you need to get more vitamin D.  ?? Cheese, egg yolks, and beef liver. These foods have vitamin D in small amounts.  ?? Milk, soy drinks, orange juice, yogurt, margarine, and some kinds of cereal have vitamin D added to them.  Some people don't make vitamin D as well as others. They may have to take extra care in getting enough vitamin D.  Things that reduce how much vitamin D your body makes include:  ?? Dark skin, such as many African Americans have.  ?? Age, especially if you are older than 56.  ?? Digestive problems, such as Crohn's or celiac disease.  ?? Liver and kidney disease.  Some people who do not get enough vitamin D may need supplements.  Are there any risks from taking vitamin D?  ?? Too much vitamin D:  ?? Can damage your kidneys.  ?? Can cause nausea and vomiting, constipation, and weakness.  ?? Raises the amount of calcium in your blood. If this happens, you can get confused or have an irregular heart rhythm.  ?? Vitamin D may interact with other medicines. Tell your doctor about all of the medicines you take, including over-the-counter drugs, herbs, and  pills. Tell your doctor about all of your current medical problems.   Where can you learn more?   Go to [email or web address]  Enter V530 in the search box to learn more about "Learning About Vitamin D."   ?? 2006-2015 Healthwise, Incorporated. Care instructions adapted under license by [employer] (which disclaims liability or warranty for this information). This care instruction is for use with your licensed healthcare professional. If you have questions about a medical condition or this instruction, always ask your healthcare professional. Healthwise, Incorporated disclaims any warranty or liability for your use of this information.  Content Version: 10.7.482551; Current as of: September 11, 2013

## 2014-11-24 ENCOUNTER — Encounter

## 2014-11-24 ENCOUNTER — Inpatient Hospital Stay: Admit: 2014-11-24 | Payer: PRIVATE HEALTH INSURANCE | Primary: Family Medicine

## 2014-11-24 DIAGNOSIS — M47816 Spondylosis without myelopathy or radiculopathy, lumbar region: Secondary | ICD-10-CM

## 2014-11-24 MED ORDER — DICLOFENAC 75 MG TAB, DELAYED RELEASE
75 mg | ORAL_TABLET | Freq: Two times a day (BID) | ORAL | Status: DC | PRN
Start: 2014-11-24 — End: 2015-05-05

## 2014-11-24 MED ORDER — PHENTERMINE 37.5 MG TAB
37.5 mg | ORAL_TABLET | ORAL | Status: DC
Start: 2014-11-24 — End: 2014-12-20

## 2014-11-24 NOTE — Telephone Encounter (Signed)
Rx called in.

## 2014-11-24 NOTE — Telephone Encounter (Signed)
??   Diclofenac 75mg , 2/day. This what we discussed to continue, and was prescribed by Dr. Guadlupe Spanishidgeway, but needs refill.   Also Phentermine.   Thank you,   Kathlene NovemberMike

## 2014-12-19 MED ORDER — TESTOSTERONE CYPIONATE 200 MG/ML IM OIL
200 mg/mL | INTRAMUSCULAR | Status: DC
Start: 2014-12-19 — End: 2014-12-20

## 2014-12-20 ENCOUNTER — Encounter

## 2014-12-20 NOTE — Telephone Encounter (Signed)
I printed both prescriptions for him. kkb

## 2014-12-20 NOTE — Telephone Encounter (Signed)
From: Steven [name]  To: [name], NP  Sent: 12/20/2014 5:32 PM EST  Subject:  Medication Renewal Request    Original  authorizing provider: [name], NP    Steven DuboisMichael  L [name] would like a refill of the following medications:  phentermine  (ADIPEX-P) 37.5 mg tablet Delman Kitten[[name], NP]  testosterone  cypionate (DEPOTESTOTERONE CYPIONATE) 200 mg/mL injection Delman Kitten[[name], NP]    Preferred  pharmacy: [id number] - [place], [place] - [address] AT [address]    Comment:  I  sent the same request via another message screen. Apologies for redundancy. Ps.Marland Kitchen.Marland Kitchen.I did see the refill rx of Phentermine on 11-24-14. Not sure why they did not refill it. I will ask. It may be like Testosterone. ..where initially it was denied by Insurance, and after denial, Olegario MessierKathy needed to confirm request or an extra step? Thanks.

## 2014-12-21 MED ORDER — PHENTERMINE 37.5 MG TAB
37.5 mg | ORAL_TABLET | ORAL | Status: DC
Start: 2014-12-21 — End: 2015-02-17

## 2014-12-21 MED ORDER — TESTOSTERONE CYPIONATE 200 MG/ML IM OIL
200 mg/mL | INTRAMUSCULAR | Status: DC
Start: 2014-12-21 — End: 2015-03-24

## 2015-01-27 ENCOUNTER — Ambulatory Visit
Admit: 2015-01-27 | Discharge: 2015-01-27 | Payer: PRIVATE HEALTH INSURANCE | Attending: Family Medicine | Primary: Family Medicine

## 2015-01-27 DIAGNOSIS — E7800 Pure hypercholesterolemia, unspecified: Secondary | ICD-10-CM

## 2015-01-27 MED ORDER — LAMOTRIGINE 100 MG TAB
100 mg | ORAL_TABLET | Freq: Every day | ORAL | Status: DC
Start: 2015-01-27 — End: 2015-04-29

## 2015-01-27 MED ORDER — TOPIRAMATE 50 MG TAB
50 mg | ORAL_TABLET | Freq: Two times a day (BID) | ORAL | Status: DC
Start: 2015-01-27 — End: 2015-01-27

## 2015-01-27 MED ORDER — TOPIRAMATE 50 MG TAB
50 mg | ORAL_TABLET | Freq: Every day | ORAL | Status: DC
Start: 2015-01-27 — End: 2015-04-29

## 2015-01-28 LAB — METABOLIC PANEL, COMPREHENSIVE
A-G Ratio: 2.1 (ref 1.1–2.5)
ALT (SGPT): 63 IU/L — ABNORMAL HIGH (ref 0–44)
AST (SGOT): 42 IU/L — ABNORMAL HIGH (ref 0–40)
Albumin: 4.8 g/dL (ref 3.5–5.5)
Alk. phosphatase: 69 IU/L (ref 39–117)
BUN/Creatinine ratio: 15 (ref 9–20)
BUN: 22 mg/dL (ref 6–24)
Bilirubin, total: 0.5 mg/dL (ref 0.0–1.2)
CO2: 17 mmol/L — ABNORMAL LOW (ref 18–29)
Calcium: 9.6 mg/dL (ref 8.7–10.2)
Chloride: 98 mmol/L (ref 97–108)
Creatinine: 1.49 mg/dL — ABNORMAL HIGH (ref 0.76–1.27)
GFR est AA: 59 mL/min/{1.73_m2} — ABNORMAL LOW (ref >59)
GFR est non-AA: 51 mL/min/{1.73_m2} — ABNORMAL LOW (ref >59)
GLOBULIN, TOTAL: 2.3 g/dL (ref 1.5–4.5)
Glucose: 111 mg/dL — ABNORMAL HIGH (ref 65–99)
Potassium: 4.7 mmol/L (ref 3.5–5.2)
Protein, total: 7.1 g/dL (ref 6.0–8.5)
Sodium: 135 mmol/L (ref 134–144)

## 2015-01-28 LAB — VITAMIN D, 25 HYDROXY: VITAMIN D, 25-HYDROXY: 23.1 ng/mL — ABNORMAL LOW (ref 30.0–100.0)

## 2015-01-28 LAB — LIPID PANEL
Cholesterol, total: 170 mg/dL (ref 100–199)
HDL Cholesterol: 28 mg/dL — ABNORMAL LOW (ref >39)
LDL, calculated: 69 mg/dL (ref 0–99)
Triglyceride: 364 mg/dL — ABNORMAL HIGH (ref 0–149)
VLDL, calculated: 73 mg/dL — ABNORMAL HIGH (ref 5–40)

## 2015-01-31 MED ORDER — SAFETY NEEDLES 21 GAUGE X 1 1/2"
21 gauge x 1 1/2" | Status: DC
Start: 2015-01-31 — End: 2015-04-29

## 2015-01-31 NOTE — Patient Instructions (Signed)
High Cholesterol: Care Instructions  Your Care Instructions  Cholesterol is a type of fat in your blood. It is needed for many body functions, such as making new cells. Cholesterol is made by your body. It also comes from food you eat. High cholesterol means that you have too much of the fat in your blood. This raises your risk of a heart attack and stroke.  LDL and HDL are part of your total cholesterol. LDL is the "bad" cholesterol. High LDL can raise your risk for heart disease, heart attack, and stroke. HDL is the "good" cholesterol. It helps clear bad cholesterol from the body. High HDL is linked with a lower risk of heart disease, heart attack, and stroke.  Your cholesterol levels help your doctor find out your risk for having a heart attack or stroke. You and your doctor can talk about whether you need to lower your risk and what treatment is best for you.  A heart-healthy lifestyle along with medicines can help lower your cholesterol and your risk. The way you choose to lower your risk will depend on how high your risk is for heart attack and stroke. It will also depend on how you feel about taking medicines.  Follow-up care is a key part of your treatment and safety. Be sure to make and go to all appointments, and call your doctor if you are having problems. It's also a good idea to know your test results and keep a list of the medicines you take.  How can you care for yourself at home?  ?? Eat a variety of foods every day. Good choices include fruits, vegetables, whole grains (like oatmeal), dried beans and peas, nuts and seeds, soy products (like tofu), and fat-free or low-fat dairy products.  ?? Replace butter, margarine, and hydrogenated or partially hydrogenated oils with olive and canola oils. (Canola oil margarine without trans fat is fine.)  ?? Replace red meat with fish, poultry, and soy protein (like tofu).  ?? Limit processed and packaged foods like chips, crackers, and cookies.   ?? Bake, broil, or steam foods. Don't fry them.  ?? Limit foods high in cholesterol. These include egg yolks.  ?? Be physically active. Get at least 30 minutes of exercise on most days of the week. Walking is a good choice. You also may want to do other activities, such as running, swimming, cycling, or playing tennis or team sports.  ?? Stay at a healthy weight or lose weight by making the changes in eating and physical activity listed above. Losing just a small amount of weight, even 5 to 10 pounds, can reduce your risk for having a heart attack or stroke.  ?? Do not smoke.  When should you call for help?  Watch closely for changes in your health, and be sure to contact your doctor if:  ?? You need help making lifestyle changes.  ?? You have questions about your medicine.   Where can you learn more?   Go to http://www.healthwise.net/[facility]  Enter I865 in the search box to learn more about "High Cholesterol: Care Instructions."   ?? 2006-2015 Healthwise, Incorporated. Care instructions adapted under license by [employer] (which disclaims liability or warranty for this information). This care instruction is for use with your licensed healthcare professional. If you have questions about a medical condition or this instruction, always ask your healthcare professional. Healthwise, Incorporated disclaims any warranty or liability for your use of this information.  Content Version: 10.7.482551; Current as of: December 18, 2013              Learning About High Blood Pressure  What is high blood pressure?     Blood pressure is a measure of how hard the blood pushes against the walls of your arteries. It's normal for blood pressure to go up and down throughout the day, but if it stays up, you have high blood pressure. Another name for high blood pressure is hypertension.  Two numbers tell you your blood pressure. The first number is the systolic pressure. It shows how hard the blood pushes when your heart is pumping.  The second number is the diastolic pressure. It shows how hard the blood pushes between heartbeats, when your heart is relaxed and filling with blood.  A blood pressure of less than 120/80 (say "120 over 80") is ideal for an adult. High blood pressure is 140/90 or higher. Many people fall into the category in between, called prehypertension. People with prehypertension need to make lifestyle changes to bring their blood pressure down and help prevent or delay high blood pressure.  What happens when you have high blood pressure?  ?? Blood flows through your arteries with too much force. Over time, this damages the walls of your arteries. But you can't feel it. High blood pressure usually doesn't cause symptoms.  ?? Fat and calcium start to build up in your arteries. This buildup is called plaque. Plaque makes your arteries narrower and stiffer. Blood can't flow through them as easily.  ?? This lack of good blood flow starts to damage some of the organs in your body. This can lead to problems such as coronary artery disease and heart attack, heart failure, stroke, kidney failure, and eye damage.  How can you prevent high blood pressure?  ?? Stay at a healthy weight.  ?? Try to limit how much sodium you eat to less than 2,300 milligrams (mg) a day. And try to limit the sodium you eat to less than 1,500 mg a day if you are 51 or older, are black, or have high blood pressure, diabetes, or chronic kidney disease.  ?? Buy foods that are labeled "unsalted," "sodium-free," or "low-sodium." Foods labeled "reduced-sodium" and "light sodium" may still have too much sodium.  ?? Flavor your food with garlic, lemon juice, onion, vinegar, herbs, and spices instead of salt. Do not use soy sauce, steak sauce, onion salt, garlic salt, mustard, or ketchup on your food.  ?? Use less salt (or none) when recipes call for it. You can often use half the salt a recipe calls for without losing flavor.   ?? Be physically active. Get at least 30 minutes of exercise on most days of the week. Walking is a good choice. You also may want to do other activities, such as running, swimming, cycling, or playing tennis or team sports.  ?? Limit alcohol to 2 drinks a day for men and 1 drink a day for women.  ?? Eat plenty of fruits, vegetables, and low-fat dairy products. Eat less saturated and total fats.  How is high blood pressure treated?  ?? Your doctor will suggest making lifestyle changes. For example, your doctor may ask you to eat healthy foods, quit smoking, lose extra weight, and be more active.  ?? If lifestyle changes don't help enough or your blood pressure is very high, you will have to take medicine every day.  Follow-up care is a key part of your treatment and safety. Be sure to make  and go to all appointments, and call your doctor if you are having problems. It's also a good idea to know your test results and keep a list of the medicines you take.   Where can you learn more?   Go to [email or web address]  Enter P501 in the search box to learn more about "Learning About High Blood Pressure."   ?? 2006-2015 Healthwise, Incorporated. Care instructions adapted under license by [employer] (which disclaims liability or warranty for this information). This care instruction is for use with your licensed healthcare professional. If you have questions about a medical condition or this instruction, always ask your healthcare professional. Healthwise, Incorporated disclaims any warranty or liability for your use of this information.  Content Version: 10.7.482551; Current as of: December 18, 2013              Hypogonadism: Care Instructions  Your Care Instructions  Men who have hypogonadism do not make enough testosterone. This hormone allows men to make sperm and to have normal physical male traits. The condition also is known as testosterone deficiency. It can lead to loss of  sex drive, weakness, impotence, infertility, and weakened bones.  Many things can cause this condition. Causes include injured testicles, certain medicines, an infection, and aging. Having a long-term health problem such as kidney or liver disease or being obese can cause it. So can surgery or radiation treatment for another health problem. It also can be present at birth. It is most often treated with testosterone hormone. You can get the hormone as a shot or through a patch or gel on the skin.  Follow-up care is a key part of your treatment and safety. Be sure to make and go to all appointments, and call your doctor if you are having problems. It's also a good idea to know your test results and keep a list of the medicines you take.  How can you care for yourself at home?  ?? Take your medicines exactly as prescribed. Call your doctor if you think you are having a problem with your medicine. You will get more details on the specific medicines your doctor prescribes.  ?? Follow your treatment plan. If you use testosterone hormones, follow your doctor's instructions. Hormones can help relieve many of the effects of this condition, such as impotence. But it may take weeks or months for your symptoms to improve.  ?? Get plenty of exercise. And make sure to get plenty of calcium and vitamin D in your diet. Eat more dairy foods and green vegetables. They can help keep your bones from getting weak.  ?? If you have a hard time dealing with this condition, talk to your doctor about joining a support group. Talking with others who have the same problems can help you cope.  When should you call for help?  Call your doctor now or seek immediate medical care if:  ?? You have headaches.  ?? You have problems with your vision.  Watch closely for changes in your health, and be sure to contact your doctor if:  ?? You have trouble getting or keeping an erection.  ?? You have a loss of body hair.   ?? You feel weak or tired a lot of the time.  ?? Your breasts are getting larger.  ?? You do not get better as expected.   Where can you learn more?   Go to [email or web address]  Enter V434 in the search box to learn more about "Hypogonadism: Care Instructions."   ??  2006-2015 Healthwise, Incorporated. Care instructions adapted under license by [employer] (which disclaims liability or warranty for this information). This care instruction is for use with your licensed healthcare professional. If you have questions about a medical condition or this instruction, always ask your healthcare professional. Healthwise, Incorporated disclaims any warranty or liability for your use of this information.  Content Version: 10.7.482551; Current as of: January 15, 2014              Insulin Resistance: Care Instructions  Your Care Instructions  Insulin resistance means that the body cannot use insulin properly. Insulin lets sugar (glucose) enter the body's cells, where it is used for energy. It also helps muscles, fat, and liver cells store sugar to be released when needed. If the body tissues do not respond properly to insulin, the blood sugar level rises.  Insulin resistance mainly is caused by obesity, although other medical conditions, such as acromegaly and Cushing's syndrome, also can cause it. It can run in families too.  Follow-up care is a key part of your treatment and safety. Be sure to make and go to all appointments, and call your doctor if you are having problems. It's also a good idea to know your test results and keep a list of the medicines you take.  How can you care for yourself at home?  ?? Take your medicines exactly as prescribed. Call your doctor if you think you are having a problem with your medicine. You will get more details on the specific medicines your doctor prescribes.  ?? Eat a good diet that spreads carbohydrate throughout the day.   ?? Get at least 30 minutes of exercise on most days of the week. Exercise helps control your blood sugar. It also helps you maintain a healthy weight. Walking is a good choice. You also may want to do other activities, such as running, swimming, cycling, or playing tennis or team sports.  ?? Try to lose weight. Losing even a small amount of weight can help.  ?? Do not smoke. If you need help quitting, talk to your doctor about stop-smoking programs and medicines. These can increase your chances of quitting for good.  When should you call for help?  Call 911 anytime you think you may need emergency care. For example, call if:  ?? You have symptoms of a heart attack. These may include:  ?? Chest pain or pressure, or a strange feeling in the chest.  ?? Sweating.  ?? Shortness of breath.  ?? Pain, pressure, or a strange feeling in the back, neck, jaw, or upper belly or in one or both shoulders or arms.  ?? Lightheadedness or sudden weakness.  ?? A fast or irregular heartbeat.  After you call 911, the operator may tell you to chew 1 adult-strength or 2 to 4 low-dose aspirin. Wait for an ambulance. Do not try to drive yourself.  ?? You have symptoms of a stroke. These may include:  ?? Sudden numbness, tingling, weakness, or loss of movement in your face, arm, or leg, especially on only one side of your body.  ?? Sudden vision changes.  ?? Sudden trouble speaking.  ?? Sudden confusion or trouble understanding simple statements.  ?? Sudden problems with walking or balance.  ?? A sudden, severe headache that is different from past headaches.  Call your doctor now or seek immediate medical care if:  ?? You have any symptoms of diabetes. These may include:  ?? Being thirsty more often.  ??  Urinating more.  ?? Being hungrier.  ?? Losing weight.  ?? Being very tired.  ?? Having blurry vision.  ?? You have a wound that will not heal.  ?? You have an infection that will not go away.  ?? You have problems with your blood pressure.   Watch closely for changes in your health, and be sure to contact your doctor if you have any problems.   Where can you learn more?   Go to [email or web address]  Enter V537 in the search box to learn more about "Insulin Resistance: Care Instructions."   ?? 2006-2015 Healthwise, Incorporated. Care instructions adapted under license by [employer] (which disclaims liability or warranty for this information). This care instruction is for use with your licensed healthcare professional. If you have questions about a medical condition or this instruction, always ask your healthcare professional. Healthwise, Incorporated disclaims any warranty or liability for your use of this information.  Content Version: 10.7.482551; Current as of: June 07, 2014              Diet and Exercise for Metabolic Syndrome: Care Instructions  Your Care Instructions  Metabolic syndrome is the name for a group of health problems. It includes having too much fat around your waist and high blood pressure. It also includes high triglycerides, high blood sugar, and low levels of healthy (HDL) cholesterol. These problems make it more likely you will have a heart attack or stroke or get diabetes.  Your family history (your genes) can cause metabolic syndrome. So can unhealthy eating habits and not getting enough exercise.  You can help lower your risk of heart attack, stroke, and diabetes if you eat healthy foods and get more exercise. It may be hard to make these lifestyle changes. But even small changes can help.  Follow-up care is a key part of your treatment and safety. Be sure to make and go to all appointments, and call your doctor if you are having problems. It's also a good idea to know your test results and keep a list of the medicines you take.  How can you care for yourself at home?  Eat more fruits and vegetables  ?? Fruits and vegetables have nutrients to help protect you from heart  disease and high blood pressure. They are low in fat and high in fiber. Dark green, orange, and yellow ones are the healthiest.  ?? Keep lots of vegetables ready for snacks.  ?? Buy fruit that is in season. Then put it where you can see it so you will want to eat it.  ?? Cook dishes that have a lot of vegetables. Soups and stir-fries are good choices.  Limit saturated and trans fats  ?? Read food labels, and try to avoid saturated and trans fats. They increase your risk of heart disease.  ?? Use olive or canola oil when you cook.  ?? Bake, broil, grill, or steam foods. Avoid fried foods.  ?? Limit how much high-fat meat you eat. This includes hot dogs and sausages. When you prepare meat, cut off all the fat.  ?? You can replace high-fat meat with fish and skinless poultry. You can also try products made from soybeans, like tofu. Soybeans may be very good for your heart.  ?? Eat at least 2 servings of fish a week. Fish with omega-3 fatty acids may help reduce your risk of heart attack. Salmon and sardines are good examples.  ?? Choose low-fat or fat-free milk and dairy products.  Eat foods high in fiber  ?? Foods high in fiber may reduce your cholesterol. And they may give you important vitamins and minerals. Good examples are oatmeal, cooked dried beans, brown rice, citrus fruits, and apples.  ?? Eat whole-grain breads and cereals. They have more fiber than white bread or pastries.  Limit high-sugar foods  ?? Limit foods and drinks that are high in sugar. Some examples are soda pop, sugar-sweetened fruit drinks, candy, and many desserts.  ?? Limit the amount of sugar, honey, and other sweeteners that you add to food and drinks.  ?? Choose water instead of soda pop or other sugar-sweetened drinks.  ?? Limit fruit juice.  Limit salt and sodium  ?? You can help lower your blood pressure if you limit salt and sodium.  ?? If you take the salt shaker off the table, it may be easier to use less  salt. You can also try using half the salt in a recipe. And you can avoid adding salt to cooking water for pasta, rice, and potatoes.  ?? Try to eat fewer snacks, fast foods, and other high-salt, processed foods. Check labels so you know how much sodium a food has.  ?? Choose canned goods (soups, vegetables, and beans) that are low in sodium.  Get regular exercise  ?? Get more exercise. Make sure your doctor knows when you start a new exercise program. Even small amounts of exercise will help you get stronger and have more energy. It can also help you manage your weight and your stress.  ?? Walking is a good choice. Little by little, increase the amount you walk every day. Try for at least 30 minutes on most days of the week.   Where can you learn more?   Go to [email or web address]  Enter H930 in the search box to learn more about "Diet and Exercise for Metabolic Syndrome: Care Instructions."   ?? 2006-2015 Healthwise, Incorporated. Care instructions adapted under license by [employer] (which disclaims liability or warranty for this information). This care instruction is for use with your licensed healthcare professional. If you have questions about a medical condition or this instruction, always ask your healthcare professional. Healthwise, Incorporated disclaims any warranty or liability for your use of this information.  Content Version: 10.7.482551; Current as of: September 11, 2013

## 2015-01-31 NOTE — Progress Notes (Signed)
HISTORY OF PRESENT ILLNESS  Steven DuboisMichael L [name] is a 59 y.o. male.  HPI The patient is a 59 y.o. male who is seen for follow up of Hyperlipidemia, HTN, Vit D Deficiency, Testicular Hypofunction, Mood Disorder and Obesity.  Symptoms are stable and well controlled.  The patient is following treatment regimen with good compliance, patient has improved significantly, no reported side effects of regimen.  He  is not following an appropriate diet and is not exercising regularly.       He is now taking Testosterone 100 mg IM every 7 days and states that this regimen is really helping him feel much better.     He states that Dr. Donnie AhoLoudermilk started him on Topamax to try to decrease his appetite because losing weight will take a lot of pressure off of his low back which will help to decrease his severe low back pain. He is tolerating the Topamax 25 mg bid dose well and he does believe that it has caused a decrease in his appetite.         Review of Systems   Constitutional: Positive for malaise/fatigue and diaphoresis (excessive sweating). Negative for weight loss.   HENT: Negative for congestion.    Eyes: Negative for blurred vision and pain.   Respiratory: Negative for cough and shortness of breath.    Cardiovascular: Negative for chest pain, palpitations and leg swelling.   Gastrointestinal: Negative for heartburn, nausea, vomiting, abdominal pain, diarrhea and constipation.   Genitourinary: Negative for dysuria and frequency.   Musculoskeletal: Positive for myalgias, back pain (Chronic low back pain that radiates down his LLE) and joint pain (chronic left hip pain).   Skin: Negative for itching and rash.   Neurological: Negative for dizziness, sensory change, focal weakness, weakness and headaches.   Endo/Heme/Allergies: Negative for environmental allergies and polydipsia.   Psychiatric/Behavioral: Negative for depression, suicidal ideas and substance abuse. The patient is nervous/anxious (He has not had any panic  attacks since his last visit.). The patient does not have insomnia.    All other systems reviewed and are negative.      BP 142/79 mmHg   Pulse 78   Temp(Src) 97.6 ??F (36.4 ??C) (Oral)   Ht 5\' 11"  (1.803 m)   Wt 289 lb (131.09 kg)   BMI 40.33 kg/m2   SpO2 96%  He has gained 7 lb since 10/15 and 13 lb since 6/15.      Physical Exam   Constitutional: He is oriented to person, place, and time. He appears well-developed and well-nourished. He does not appear ill. No distress.   HENT:   Head: Normocephalic and atraumatic.   Eyes: Conjunctivae are normal. No scleral icterus.   Neck: Normal range of motion and phonation normal. Neck supple.   Cardiovascular: Normal rate, regular rhythm, normal heart sounds, intact distal pulses and normal pulses.    No peripheral edema noted.   Pulmonary/Chest: Effort normal and breath sounds normal.   Neurological: He is alert and oriented to person, place, and time. He displays no tremor. No sensory deficit. He exhibits normal muscle tone. Gait (he ambulates slowly leaning forward with an antalgic gait d/t his chronic low back pain that radiates down his LLE ) abnormal.   Skin: Skin is warm and dry. No rash noted.   Psychiatric: His speech is normal and behavior is normal. Judgment and thought content normal. His mood appears anxious (much improved). His affect is not angry, not blunt, not labile and not inappropriate. Cognition and  memory are normal. He exhibits a depressed mood (much improved).   Nursing note and vitals reviewed.      ASSESSMENT and PLAN    ICD-10-CM ICD-9-CM    1. Pure hypercholesterolemia E78.0 272.0 METABOLIC PANEL, COMPREHENSIVE      LIPID PANEL  Continue Pravachol 40 mg qhs   2. Essential hypertension, benign I10 401.1 Continue Metoprolol XL 50 mg po qhs  Continue Diovan/HCT 320/12.5 1 po qd   3. 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 Mood disorder (HCC) F39 296.90 lamoTRIgine (LAMICTAL) 100 mg tablet   6. BMI 40.0-44.9, adult (HCC) Z68.41 V85.41 topiramate (TOPAMAX) 50 mg tablet  Continue Phentermine 37.5 mg 1 po every day   Labs checked and I will treat as needed when I see the results. Meds refilled. He will call for refills when on his other meds when he needs them. He was encouraged to strictly follow a low cholesterol, low glycemic index, high protein diet and to get 30 minutes of CV exercise 4-5 days a week. He will RTO in 3 months for a recheck and repeat fasting labs or sooner prn.

## 2015-02-01 MED ORDER — VALSARTAN-HYDROCHLOROTHIAZIDE 320 MG-12.5 MG TAB
ORAL_TABLET | ORAL | Status: DC
Start: 2015-02-01 — End: 2015-02-16

## 2015-02-16 MED ORDER — VALSARTAN-HYDROCHLOROTHIAZIDE 320 MG-12.5 MG TAB
ORAL_TABLET | ORAL | Status: DC
Start: 2015-02-16 — End: 2015-04-28

## 2015-02-17 ENCOUNTER — Ambulatory Visit
Admit: 2015-02-17 | Discharge: 2015-02-21 | Payer: PRIVATE HEALTH INSURANCE | Attending: Family Medicine | Primary: Family Medicine

## 2015-02-17 DIAGNOSIS — M25561 Pain in right knee: Secondary | ICD-10-CM

## 2015-02-17 MED ORDER — PHENTERMINE 37.5 MG TAB
37.5 mg | ORAL_TABLET | ORAL | Status: DC
Start: 2015-02-17 — End: 2015-04-28

## 2015-02-17 NOTE — Telephone Encounter (Signed)
From: Barton DuboisMichael L Stickels  Sent:  02/16/2015 4:22 PM EDT  To:  Pre Nurses  Subject:  XW:RUEAVWURE:MyChart After Visit Follow-Up Message.    -  Lately, to save save time and not forget anything I bring a pre-printed list "area of concern".  -  I was also encouraged to start tracking specific health areas that have changed, or are new developments.  -  Quantifying facts, like Blood Pressure really helps.  -  Reluctantly my weight was up...but now 10 lbs off since visit, also shows even bad stats help with: A Good Doctor Consult.     The  previous four words are the key.    Your  Provider, Jerral BonitoKathy Benson is why I love [facility]!     She  has made the time, AFTER hours to call rather than be delayed with information.... In my F-500 it was something we rarely (if ever) found.    [facility]  now labelled "[facility] in/out" facility, is suffering the public and financial consequences.    You  have a Biomedical engineerkeeper!  -----  Message -----  From:  [name], NP  Sent:  02/05/2015 12:00 AM EDT  To:  Barton DuboisMichael L Nishi  Subject:  MyChart After Visit Follow-Up Message.    Hello  [name],    [facility]  [facility].  Please  take a moment to complete a brief survey regarding your visit.      Sincerely,    [facility]  [facility]

## 2015-02-21 NOTE — Patient Instructions (Signed)
Knee Pain: Care Instructions  Your Care Instructions     Overuse is often the cause of knee pain. Other causes are climbing stairs, kneeling, or other activities that use the knee. Everyday wear and tear, especially as you get older, also can cause knee pain.  Rest, along with home treatment, often relieves pain and allows your knee to heal. If you have a serious knee injury, you may need tests and treatment.  Follow-up care is a key part of your treatment and safety. Be sure to make and go to all appointments, and call your doctor if you are having problems. It???s also a good idea to know your test results and keep a list of the medicines you take.  How can you care for yourself at home?  ?? Take pain medicines exactly as directed.  ?? If the doctor gave you a prescription medicine for pain, take it as prescribed.  ?? If you are not taking a prescription pain medicine, ask your doctor if you can take an over-the-counter medicine.  ?? Do not take two or more pain medicines at the same time unless the doctor told you to. Many pain medicines contain acetaminophen, which is Tylenol. Too much acetaminophen (Tylenol) can be harmful.  ?? Rest and protect your knee. Take a break from any activity that may cause pain.  ?? Put ice or a cold pack on your knee for 10 to 20 minutes at a time. Put a thin cloth between the ice and your skin.  ?? Prop up a sore knee on a pillow when you ice it or anytime you sit or lie down for the next 3 days. Try to keep it above the level of your heart. This will help reduce swelling.  ?? If your doctor recommends an elastic bandage, sleeve, or other type of support for your knee, wear it as directed.  ?? If your knee is not swollen, you can put moist heat, a heating pad, or a warm cloth on your knee.  ?? After several days of rest, you can begin gentle exercise of your knee.  ?? Reach and stay at a healthy weight. Extra weight can strain the joints,  especially the knees and hips, and make the pain worse. Losing even a few pounds may help.  When should you call for help?  Call 911 anytime you think you may need emergency care. For example, call if:  ?? You have sudden chest pain and shortness of breath, or you cough up blood.  Call your doctor now or seek immediate medical care if:  ?? You have severe or increasing pain.  ?? Your leg or foot turns cold or changes color.  ?? You cannot stand or put weight on your knee.  ?? Your knee looks twisted or bent out of shape.  ?? You cannot move your knee.  ?? You have signs of infection, such as:  ?? Increased pain, swelling, warmth, or redness.  ?? Red streaks leading from the sore area.  ?? Pus draining from a place on your knee.  ?? A fever.  ?? You have signs of a blood clot in your leg, such as:  ?? Pain in your calf, back of the knee, thigh, or groin.  ?? Redness and swelling in your leg or groin.  Watch closely for changes in your health, and be sure to contact your doctor if:  ?? Your knee feels numb or tingly.  ?? You do not get better as expected.  ??   You have any new symptoms, such as swelling.  ?? You have bruises from a knee injury that last longer than 2 weeks.   Where can you learn more?   Go to [email or web address]  Enter K195 in the search box to learn more about "Knee Pain: Care Instructions."   ?? 2006-2016 Healthwise, Incorporated. Care instructions adapted under license by [employer] (which disclaims liability or warranty for this information). This care instruction is for use with your licensed healthcare professional. If you have questions about a medical condition or this instruction, always ask your healthcare professional. Healthwise, Incorporated disclaims any warranty or liability for your use of this information.  Content Version: 10.8.513193; Current as of: September 17, 2014        Anterior Cruciate Ligament (ACL) Tear: Care Instructions  Your Care Instructions      The anterior cruciate ligament (ACL) is one of four knee ligaments that connect the upper leg bone with the lower leg bones. The ACL keeps your knee stable when your leg moves forward and backward. It also keeps the knee from bending sideways. A torn ACL often occurs during sports that require stop-and-go or twisting motions, such as soccer, football, basketball, and snow skiing.  Treatment depends on how badly the ACL is torn. Not all injuries need surgery. Your doctor also will base your treatment on how unstable the knee is, whether other parts of your knee are injured, and how active you are. You may be able to wait for a while before deciding whether to have surgery.  Follow-up care is a key part of your treatment and safety. Be sure to make and go to all appointments, and call your doctor if you are having problems. It's also a good idea to know your test results and keep a list of the medicines you take.  How can you care for yourself at home?  ?? Follow your doctor's directions for wearing a brace or an immobilizer, which limits movement of your knee. Wrapping your knee with an elastic bandage may help reduce or prevent swelling.  ?? Use crutches as directed in the first few days after your injury if your doctor recommends them.  ?? Rest your knee when possible. But try to flex your calf muscles often to help blood circulate in your legs.  ?? Put ice or a cold pack on your knee for 10 to 20 minutes at a time. Try to do this every 1 to 2 hours during the next 3 days (when you are awake) or until the swelling goes down. Put a thin cloth between the ice and your skin.  ?? Prop up your leg on a pillow when you ice it or anytime you sit or lie down during the next 3 days. Try to keep it above the level of your heart. This will help reduce swelling.  ?? Take pain medicines exactly as directed.  ?? If the doctor gave you a prescription medicine for pain, take it as prescribed.   ?? If you are not taking a prescription pain medicine, take an over-the-counter medicine to reduce pain and swelling. These include ibuprofen (Advil, Motrin) and naproxen (Aleve). Read and follow all instructions on the label.  ?? Follow your doctor's or physical therapist's directions for strength exercises. Exercises to make your thigh muscles stronger and increase knee motion can help you get ready for a physical rehabilitation (rehab) program or surgery with a rehab program. Here are a few exercises you can do if your  doctor says it is okay.  ?? Quad sets: Lie down on the floor or the bed with your injured leg straight. Fully extend your leg???there should be no or little bend in your knee. Tighten the thigh (quadriceps) of your injured leg for 10 seconds. Do not lift your heel up. Relax your quadriceps for 10 seconds. Repeat 8 to 12 times several times during the day.  ?? Straight-leg raises: Lie down on the floor or the bed with your injured leg flat and your uninjured leg bent so that the bottom of your foot is on the floor or bed. Tighten the quadriceps of your injured leg. Keeping your knee as straight as possible, lift your injured leg off the bed until it is about 18 inches above the bed or floor. Lower your leg back down and relax for 5 seconds. Do 3 sets of 8 to 12 repetitions.  ?? Heel slides: Lie down on the floor or the bed with your leg flat. Slowly begin to slide your heel toward your rear end (buttocks), keeping your heel on the floor. Your knee will begin to bend. Slide your heel and bend your knee until it becomes a little sore and you can feel a small amount of pressure inside your knee. Hold this position for 15 to 30 seconds. Slide your heel back down until your leg is straight on the floor. Relax for 10 seconds. Repeat 2 to 4 times several times during the day.  ?? Side-lying leg lifts: Lie on your side with your legs straight and the  injured leg on top. Keeping your leg straight, raise your injured leg up and backward about 6 inches. Lower the leg to the starting position. Do 3 sets of 20 repetitions, or if you tire quickly, 3 sets of 8 to 12 repetitions.  When should you call for help?  Call 911 anytime you think you may need emergency care. For example, call if:  ?? You have sudden chest pain and shortness of breath, and you cough up blood.  Call your doctor now or seek immediate medical care if:  ?? You have signs of a blood clot, such as:  ?? Pain in your calf, back of the knee, thigh, or groin.  ?? Redness and swelling in your leg or groin.  ?? You have increasing or severe knee pain.  Watch closely for changes in your health, and be sure to contact your doctor if:  ?? Your knee begins to lock up or give out.  ?? You are having more trouble putting weight on your knee.  ?? You do not get better as expected.   Where can you learn more?   Go to [email or web address]  Enter [phone] in the search box to learn more about "Anterior Cruciate Ligament (ACL) Tear: Care Instructions."   ?? 2006-2016 Healthwise, Incorporated. Care instructions adapted under license by [employer] (which disclaims liability or warranty for this information). This care instruction is for use with your licensed healthcare professional. If you have questions about a medical condition or this instruction, always ask your healthcare professional. Healthwise, Incorporated disclaims any warranty or liability for your use of this information.  Content Version: 10.8.513193; Current as of: June 01, 2014        Posterior Cruciate Ligament Sprain: Rehab Exercises  Your Care Instructions  Here are some examples of typical rehabilitation exercises for your condition. Start each exercise slowly. Ease off the exercise if you start to have pain.  Your doctor or  physical therapist will tell you when you can start these exercises and which ones will work best for you.   How to do the exercises  Knee flexion with heel slide    ?? Lie on your back with your knees bent.  ?? Slide your heel back by bending your affected knee as far as you can. Then hook your other foot around your ankle to help pull your heel even farther back.  ?? Hold for about 6 seconds, then rest for up to 10 seconds.  ?? Repeat 8 to 12 times.  Quad sets    ?? Sit with your affected leg straight and supported on the floor or a firm bed. Place a small, rolled-up towel under your knee. Your other leg should be bent, with that foot flat on the floor.  ?? Tighten the thigh muscles of your affected leg by pressing the back of your knee down into the towel.  ?? Hold for about 6 seconds, then rest for up to 10 seconds.  ?? Repeat 8 to 12 times.  Short-arc quad    ?? Lie on your back with your knees bent over a foam roll or large rolled-up towel and your heels on the floor.  ?? Lift the lower part of your affected leg until your leg is straight. Keep the back of your knee on the foam roll or towel.  ?? Hold your leg straight for about 6 seconds, then slowly bend your knee and lower your heel back to the floor. Rest for up to 10 seconds between repetitions.  ?? Repeat 8 to 12 times.  Straight-leg raises to the front    ?? Lie on your back with your good knee bent so that your foot rests flat on the floor. Your affected leg should be straight. Make sure that your low back has a normal curve. You should be able to slip your hand in between the floor and the small of your back, with your palm touching the floor and your back touching the back of your hand.  ?? Tighten the thigh muscles in your affected leg by pressing the back of your knee flat down to the floor. Hold your knee straight.  ?? Keeping the thigh muscles tight and your leg straight, lift your affected leg up so that your heel is about 12 inches off the floor. Hold for about 6 seconds, then lower slowly.  ?? Relax for up to 10 seconds between repetitions.  ?? Repeat 8 to 12 times.   Straight-leg raises to the back    ?? Lie on your stomach, and lift your affected leg straight up behind you (toward the ceiling).  ?? Lift your toes about 6 inches off the floor. Hold for about 6 seconds, then lower your leg slowly.  ?? Repeat 8 to 12 times.  Follow-up care is a key part of your treatment and safety. Be sure to make and go to all appointments, and call your doctor if you are having problems. It's also a good idea to know your test results and keep a list of the medicines you take.   Where can you learn more?   Go to [email or web address]  Enter B059 in the search box to learn more about "Posterior Cruciate Ligament Sprain: Rehab Exercises."   ?? 2006-2016 Healthwise, Inc[employer]. Care instructions adapted under license by [employer] (which disclaims liability or warranty for this information). This care instruction is for use with your licensed healthcare professional. If you have  questions about a medical condition or this instruction, always ask your healthcare professional. Healthwise, Incorporated disclaims any warranty or liability for your use of this information.  Content Version: 10.8.513193; Current as of: Mar 19, 2014        Insulin Resistance: Care Instructions  Your Care Instructions  Insulin resistance means that the body cannot use insulin properly. Insulin lets sugar (glucose) enter the body's cells, where it is used for energy. It also helps muscles, fat, and liver cells store sugar to be released when needed. If the body tissues do not respond properly to insulin, the blood sugar level rises.  Insulin resistance mainly is caused by obesity, although other medical conditions, such as acromegaly and Cushing's syndrome, also can cause it. It can run in families too.  Follow-up care is a key part of your treatment and safety. Be sure to make and go to all appointments, and call your doctor if you are having  problems. It's also a good idea to know your test results and keep a list of the medicines you take.  How can you care for yourself at home?  ?? Take your medicines exactly as prescribed. Call your doctor if you think you are having a problem with your medicine. You will get more details on the specific medicines your doctor prescribes.  ?? Eat a good diet that spreads carbohydrate throughout the day.  ?? Get at least 30 minutes of exercise on most days of the week. Exercise helps control your blood sugar. It also helps you maintain a healthy weight. Walking is a good choice. You also may want to do other activities, such as running, swimming, cycling, or playing tennis or team sports.  ?? Try to lose weight. Losing even a small amount of weight can help.  ?? Do not smoke. If you need help quitting, talk to your doctor about stop-smoking programs and medicines. These can increase your chances of quitting for good.  When should you call for help?  Call 911 anytime you think you may need emergency care. For example, call if:  ?? You have symptoms of a heart attack. These may include:  ?? Chest pain or pressure, or a strange feeling in the chest.  ?? Sweating.  ?? Shortness of breath.  ?? Pain, pressure, or a strange feeling in the back, neck, jaw, or upper belly or in one or both shoulders or arms.  ?? Lightheadedness or sudden weakness.  ?? A fast or irregular heartbeat.  After you call 911, the operator may tell you to chew 1 adult-strength or 2 to 4 low-dose aspirin. Wait for an ambulance. Do not try to drive yourself.  ?? You have symptoms of a stroke. These may include:  ?? Sudden numbness, tingling, weakness, or loss of movement in your face, arm, or leg, especially on only one side of your body.  ?? Sudden vision changes.  ?? Sudden trouble speaking.  ?? Sudden confusion or trouble understanding simple statements.  ?? Sudden problems with walking or balance.  ?? A sudden, severe headache that is different from past headaches.   Call your doctor now or seek immediate medical care if:  ?? You have any symptoms of diabetes. These may include:  ?? Being thirsty more often.  ?? Urinating more.  ?? Being hungrier.  ?? Losing weight.  ?? Being very tired.  ?? Having blurry vision.  ?? You have a wound that will not heal.  ?? You have an infection that will  not go away.  ?? You have problems with your blood pressure.  Watch closely for changes in your health, and be sure to contact your doctor if you have any problems.   Where can you learn more?   Go to [email or web address]  Enter V537 in the search box to learn more about "Insulin Resistance: Care Instructions."   ?? 2006-2016 Healthwise, Inc[employer]. Care instructions adapted under license by [employer] (which disclaims liability or warranty for this information). This care instruction is for use with your licensed healthcare professional. If you have questions about a medical condition or this instruction, always ask your healthcare professional. Healthwise, Inc[employer] disclaims any warranty or liability for your use of this information.  Content Version: 10.8.513193; Current as of: June 07, 2014        Diet and Exercise for Metabolic Syndrome: Care Instructions  Your Care Instructions  Metabolic syndrome is the name for a group of health problems. It includes having too much fat around your waist and high blood pressure. It also includes high triglycerides, high blood sugar, and low levels of healthy (HDL) cholesterol. These problems make it more likely you will have a heart attack or stroke or get diabetes.  Your family history (your genes) can cause metabolic syndrome. So can unhealthy eating habits and not getting enough exercise.  You can help lower your risk of heart attack, stroke, and diabetes if you eat healthy foods and get more exercise. It may be hard to make these lifestyle changes. But even small changes can help.   Follow-up care is a key part of your treatment and safety. Be sure to make and go to all appointments, and call your doctor if you are having problems. It's also a good idea to know your test results and keep a list of the medicines you take.  How can you care for yourself at home?  Eat more fruits and vegetables  ?? Fruits and vegetables have nutrients to help protect you from heart disease and high blood pressure. They are low in fat and high in fiber. Dark green, orange, and yellow ones are the healthiest.  ?? Keep lots of vegetables ready for snacks.  ?? Buy fruit that is in season. Then put it where you can see it so you will want to eat it.  ?? Cook dishes that have a lot of vegetables. Soups and stir-fries are good choices.  Limit saturated and trans fats  ?? Read food labels, and try to avoid saturated and trans fats. They increase your risk of heart disease.  ?? Use olive or canola oil when you cook.  ?? Bake, broil, grill, or steam foods. Avoid fried foods.  ?? Limit how much high-fat meat you eat. This includes hot dogs and sausages. When you prepare meat, cut off all the fat.  ?? You can replace high-fat meat with fish and skinless poultry. You can also try products made from soybeans, like tofu. Soybeans may be very good for your heart.  ?? Eat at least 2 servings of fish a week. Fish with omega-3 fatty acids may help reduce your risk of heart attack. Salmon and sardines are good examples.  ?? Choose low-fat or fat-free milk and dairy products.  Eat foods high in fiber  ?? Foods high in fiber may reduce your cholesterol. And they may give you important vitamins and minerals. Good examples are oatmeal, cooked dried beans, brown rice, citrus fruits, and apples.  ?? Eat whole-grain breads and cereals. They  have more fiber than white bread or pastries.  Limit high-sugar foods  ?? Limit foods and drinks that are high in sugar. Some examples are soda pop, sugar-sweetened fruit drinks, candy, and many desserts.   ?? Limit the amount of sugar, honey, and other sweeteners that you add to food and drinks.  ?? Choose water instead of soda pop or other sugar-sweetened drinks.  ?? Limit fruit juice.  Limit salt and sodium  ?? You can help lower your blood pressure if you limit salt and sodium.  ?? If you take the salt shaker off the table, it may be easier to use less salt. You can also try using half the salt in a recipe. And you can avoid adding salt to cooking water for pasta, rice, and potatoes.  ?? Try to eat fewer snacks, fast foods, and other high-salt, processed foods. Check labels so you know how much sodium a food has.  ?? Choose canned goods (soups, vegetables, and beans) that are low in sodium.  Get regular exercise  ?? Get more exercise. Make sure your doctor knows when you start a new exercise program. Even small amounts of exercise will help you get stronger and have more energy. It can also help you manage your weight and your stress.  ?? Walking is a good choice. Little by little, increase the amount you walk every day. Try for at least 30 minutes on most days of the week.   Where can you learn more?   Go to [email or web address]  Enter H930 in the search box to learn more about "Diet and Exercise for Metabolic Syndrome: Care Instructions."   ?? 2006-2016 Healthwise, [employer]. Care instructions adapted under license by [employer] (which disclaims liability or warranty for this information). This care instruction is for use with your licensed healthcare professional. If you have questions about a medical condition or this instruction, always ask your healthcare professional. [employer], Inc[employer] disclaims any warranty or liability for your use of this information.  Content Version: 10.8.513193; Current as of: September 17, 2014

## 2015-02-21 NOTE — Progress Notes (Signed)
HISTORY OF PRESENT ILLNESS  Steven DuboisMichael L [name] is a 59 y.o. male.  Knee Pain  The history is provided by the patient. This is a new problem. Episode onset: He has had right knee pain off and on since he played football in HS. He twisted his right knee 2 wks ago and it started feeling better but he twisted it again on 02/16/15 and that time he heard a "snap" & has had constant pain in his right knee since. The problem occurs constantly. The problem has not changed since onset.Pertinent negatives include no chest pain, no abdominal pain, no headaches and no shortness of breath. Associated symptoms comments: He states that his right knee feels like it wants to dislocate posteriorly.. The symptoms are aggravated by walking, standing and bending. Nothing relieves the symptoms. He has tried rest (and elevation and NSAIDs) for the symptoms. The treatment provided no relief.       He is also requesting a referral to a GI MD for a screening colonoscopy.         Review of Systems   Constitutional: Positive for weight loss, malaise/fatigue and diaphoresis (excessive sweating).   HENT: Negative for congestion.    Eyes: Negative for blurred vision and pain.   Respiratory: Negative for cough and shortness of breath.    Cardiovascular: Negative for chest pain, palpitations and leg swelling.   Gastrointestinal: Negative for heartburn, nausea, vomiting, abdominal pain, diarrhea and constipation.   Genitourinary: Negative for dysuria and frequency.   Musculoskeletal: Positive for myalgias, back pain (Chronic low back pain that radiates down his LLE) and joint pain (chronic left hip pain and acute right knee pain).   Skin: Negative for itching and rash.        He has found a small skin lesion on his right forearm that he would like to have checked.    Neurological: Negative for dizziness, sensory change, focal weakness and headaches.   Endo/Heme/Allergies: Negative for environmental allergies and polydipsia.    Psychiatric/Behavioral: Negative for depression. The patient is nervous/anxious (He has not had any panic attacks since his last visit.). The patient does not have insomnia.    All other systems reviewed and are negative.        BP 144/73 mmHg   Pulse 92   Temp(Src) 97.3 ??F (36.3 ??C) (Oral)   Wt 282 lb (127.914 kg)   SpO2 96%  He has lost 7 lb since 01/27/15.      Physical Exam   Constitutional: He is oriented to person, place, and time. Vital signs are normal. He appears well-developed and well-nourished. He does not appear ill. No distress.   HENT:   Head: Normocephalic and atraumatic.   Eyes: Conjunctivae are normal. No scleral icterus.   Neck: Normal range of motion. Neck supple.   Cardiovascular: Normal rate, regular rhythm, normal heart sounds, intact distal pulses and normal pulses.    Pulses:       Dorsalis pedis pulses are 2+ on the right side.   No peripheral edema noted.   Pulmonary/Chest: Effort normal and breath sounds normal.   Musculoskeletal:        Right knee: He exhibits swelling. He exhibits normal range of motion, no ecchymosis, no deformity, no erythema, normal alignment, no LCL laxity, normal patellar mobility, no bony tenderness and no MCL laxity. No tenderness found.   Neurological: He is alert and oriented to person, place, and time. He displays no tremor. No sensory deficit. He exhibits normal muscle tone.  Gait (he ambulates slowly leaning forward with an antalgic gait d/t his chronic low back pain that radiates down his LLE and because of his right knee pain) abnormal.   Skin: Skin is warm and dry. Lesion (He has a flesh-colored, firm, slightly raised, 3 mm diameter, non-tender lesion  on his right lateral forearm. ) noted. No rash noted.   Psychiatric: His speech is normal and behavior is normal. Judgment and thought content normal. His mood appears anxious (much improved). His affect is not angry, not blunt, not labile and not inappropriate.  Cognition and memory are normal. He exhibits a depressed mood (much improved).   Nursing note and vitals reviewed.      ASSESSMENT and PLAN    ICD-10-CM ICD-9-CM    1. Acute pain of right knee M25.561 719.46 REFERRAL TO ORTHOPEDIC SURGERY   2. Skin lesion of right arm L98.9 709.9 Reassured him that this lesion is benign.   3. Obesity (BMI 30-39.9) E66.9 278.00 phentermine (ADIPEX-P) 37.5 mg tablet  Continue current diet and exercise regimen.   4. Screen for colon cancer Z12.11 V76.51 REFERRAL TO GASTROENTEROLOGY   RTO prn.

## 2015-03-24 MED ORDER — TESTOSTERONE CYPIONATE 200 MG/ML IM OIL
200 mg/mL | INTRAMUSCULAR | Status: DC
Start: 2015-03-24 — End: 2015-07-05

## 2015-04-28 ENCOUNTER — Ambulatory Visit
Admit: 2015-04-28 | Discharge: 2015-04-28 | Payer: PRIVATE HEALTH INSURANCE | Attending: Family Medicine | Primary: Family Medicine

## 2015-04-28 DIAGNOSIS — E7800 Pure hypercholesterolemia, unspecified: Secondary | ICD-10-CM

## 2015-04-28 MED ORDER — METFORMIN 1,000 MG TAB
1000 mg | ORAL_TABLET | ORAL | Status: DC
Start: 2015-04-28 — End: 2015-05-02

## 2015-04-28 MED ORDER — ERGOCALCIFEROL (VITAMIN D2) 50,000 UNIT CAP
1250 mcg (50,000 unit) | ORAL_CAPSULE | ORAL | Status: DC
Start: 2015-04-28 — End: 2015-04-29

## 2015-04-28 MED ORDER — PHENTERMINE 37.5 MG TAB
37.5 mg | ORAL_TABLET | ORAL | Status: DC
Start: 2015-04-28 — End: 2015-07-25

## 2015-04-28 MED ORDER — VALSARTAN-HYDROCHLOROTHIAZIDE 320 MG-12.5 MG TAB
ORAL_TABLET | ORAL | Status: DC
Start: 2015-04-28 — End: 2015-11-18

## 2015-04-28 MED ORDER — PRAVASTATIN 40 MG TAB
40 mg | ORAL_TABLET | ORAL | Status: DC
Start: 2015-04-28 — End: 2015-11-18

## 2015-04-28 MED ORDER — METOPROLOL SUCCINATE SR 100 MG 24 HR TAB
100 mg | ORAL_TABLET | Freq: Every evening | ORAL | Status: DC
Start: 2015-04-28 — End: 2015-11-18

## 2015-04-29 ENCOUNTER — Other Ambulatory Visit: Admit: 2015-04-29 | Discharge: 2015-04-29 | Payer: PRIVATE HEALTH INSURANCE | Primary: Family Medicine

## 2015-04-29 LAB — MICROALBUMIN, UR, RAND W/ MICROALB/CREAT RATIO
Creatinine, urine random: 128.9 mg/dL
Microalb/Creat ratio (ug/mg creat.): 19.9 mg/g creat (ref 0.0–30.0)
Microalbumin, urine: 25.6 ug/mL

## 2015-04-29 LAB — AMB POC URINALYSIS DIP STICK MANUAL W/ MICRO
Bilirubin (UA POC): NEGATIVE
Blood (UA POC): NEGATIVE
Glucose (UA POC): NEGATIVE
Ketones (UA POC): NEGATIVE
Leukocyte esterase (UA POC): NEGATIVE
Nitrites (UA POC): NEGATIVE
Protein (UA POC): NEGATIVE mg/dL
Specific gravity (UA POC): 1.01 (ref 1.001–1.035)
Urobilinogen (UA POC): 0.2 (ref 0.2–1)
pH (UA POC): 6.5 (ref 4.6–8.0)

## 2015-04-29 LAB — HEMOGLOBIN A1C WITH EAG
Estimated average glucose: 128 mg/dL
Hemoglobin A1c: 6.1 % — ABNORMAL HIGH (ref 4.8–5.6)

## 2015-04-29 LAB — METABOLIC PANEL, COMPREHENSIVE
A-G Ratio: 2 (ref 1.1–2.5)
ALT (SGPT): 45 IU/L — ABNORMAL HIGH (ref 0–44)
AST (SGOT): 37 IU/L (ref 0–40)
Albumin: 4.5 g/dL (ref 3.5–5.5)
Alk. phosphatase: 63 IU/L (ref 39–117)
BUN/Creatinine ratio: 15 (ref 9–20)
BUN: 18 mg/dL (ref 6–24)
Bilirubin, total: 0.6 mg/dL (ref 0.0–1.2)
CO2: 17 mmol/L — ABNORMAL LOW (ref 18–29)
Calcium: 9.5 mg/dL (ref 8.7–10.2)
Chloride: 102 mmol/L (ref 97–108)
Creatinine: 1.22 mg/dL (ref 0.76–1.27)
GFR est AA: 76 mL/min/{1.73_m2} (ref >59)
GFR est non-AA: 65 mL/min/{1.73_m2} (ref >59)
GLOBULIN, TOTAL: 2.3 g/dL (ref 1.5–4.5)
Glucose: 117 mg/dL — ABNORMAL HIGH (ref 65–99)
Potassium: 4.3 mmol/L (ref 3.5–5.2)
Protein, total: 6.8 g/dL (ref 6.0–8.5)
Sodium: 140 mmol/L (ref 134–144)

## 2015-04-29 LAB — VITAMIN D, 25 HYDROXY: VITAMIN D, 25-HYDROXY: 33.8 ng/mL (ref 30.0–100.0)

## 2015-04-29 LAB — LIPID PANEL
Cholesterol, total: 155 mg/dL (ref 100–199)
HDL Cholesterol: 28 mg/dL — ABNORMAL LOW (ref >39)
LDL, calculated: 79 mg/dL (ref 0–99)
Triglyceride: 240 mg/dL — ABNORMAL HIGH (ref 0–149)
VLDL, calculated: 48 mg/dL — ABNORMAL HIGH (ref 5–40)

## 2015-04-29 LAB — AMB POC COMPLETE CBC,AUTOMATED ENTER
ABS. GRANS (POC): 4.3 10*3/uL (ref 1.4–6.5)
ABS. LYMPHS (POC): 2.7 10*3/uL (ref 1.2–3.4)
ABS. MONOS (POC): 0.4 10*3/uL (ref 0.1–0.6)
GRANULOCYTES (POC): 63.8 % (ref 42.2–75.2)
HCT (POC): 61.5 % — AB (ref 35–60)
HGB (POC): 19.3 g/dL — AB (ref 11–18)
LYMPHOCYTES (POC): 30.9 % (ref 20.5–51.1)
MCH (POC): 30.7 pg (ref 27–31)
MCHC (POC): 31.4 g/dL — AB (ref 33–37)
MCV (POC): 97.6 fL (ref 80–99.9)
MONOCYTES (POC): 5.3 % (ref 1.7–9.3)
MPV (POC): 7.5 fL — AB (ref 7.8–11)
PLATELET (POC): 165 10*3/uL (ref 150–450)
RBC (POC): 6.3 M/uL — AB (ref 4–6)
RDW (POC): 15.8 % — AB (ref 11.6–13.7)
WBC (POC): 6.7 10*3/uL (ref 4.5–10.5)

## 2015-04-29 LAB — TESTOSTERONE, FREE: Free testosterone (Direct): 13.9 pg/mL (ref 7.2–24.0)

## 2015-04-29 LAB — SPECIMEN STATUS REPORT

## 2015-04-29 LAB — PSA, DIAGNOSTIC (PROSTATE SPECIFIC AG): Prostate Specific Ag: 0.4 ng/mL (ref 0.0–4.0)

## 2015-04-29 LAB — TESTOSTERONE, TOTAL, ADULT MALE: Testosterone: 425 ng/dL (ref 348–1197)

## 2015-04-29 MED ORDER — VITAMIN D2 1,250 MCG (50,000 UNIT) CAPSULE
1250 mcg (50,000 unit) | ORAL_CAPSULE | ORAL | Status: DC
Start: 2015-04-29 — End: 2015-11-18

## 2015-04-29 MED ORDER — SAFETY NEEDLES 21 GAUGE X 1 1/2"
21 gauge x 1 1/2" | Status: DC
Start: 2015-04-29 — End: 2015-04-29

## 2015-04-29 MED ORDER — SAFETY NEEDLES 21 GAUGE X 1 1/2"
21 gauge x 1 1/2" | Status: DC
Start: 2015-04-29 — End: 2015-10-12

## 2015-04-29 NOTE — Patient Instructions (Signed)
Chronic Pain: Care Instructions  Your Care Instructions  Chronic pain is pain that lasts a long time (months or even years) and may or may not have a clear cause. It is different from acute pain, which usually does have a clear cause???like an injury or illness???and gets better over time. Chronic pain:  ?? Lasts over time but may vary from day to day.  ?? Does not go away despite efforts to end it.  ?? May disrupt your sleep and lead to fatigue.  ?? May cause depression or anxiety.  ?? May make your muscles tense, causing more pain.  ?? Can disrupt your work, hobbies, home life, and relationships with friends and family.  Chronic pain is a very real condition. It is not just in your head. Treatment can help and usually includes several methods used together, such as medicines, physical therapy, exercise, and other treatments. Learning how to relax and changing negative thought patterns can also help you cope.  Chronic pain is complex. Taking an active role in your treatment will help you better manage your pain. Tell your doctor if you have trouble dealing with your pain. You may have to try several things before you find what works best for you.  Follow-up care is a key part of your treatment and safety. Be sure to make and go to all appointments, and call your doctor if you are having problems. It???s also a good idea to know your test results and keep a list of the medicines you take.  How can you care for yourself at home?  ?? Pace yourself. Break up large jobs into smaller tasks. Save harder tasks for days when you have less pain, or go back and forth between hard tasks and easier ones. Take rest breaks.  ?? Relax, and reduce stress. Relaxation techniques such as deep breathing or meditation can help.  ?? Keep moving. Gentle, daily exercise can help reduce pain over the long run. Try low- or no-impact exercises such as walking, swimming, and stationary biking. Do stretches to stay flexible.   ?? Try heat, cold packs, and massage.  ?? Get enough sleep. Chronic pain can make you tired and drain your energy. Talk with your doctor if you have trouble sleeping because of pain.  ?? Think positive. Your thoughts can affect your pain level. Do things that you enjoy to distract yourself when you have pain instead of focusing on the pain. See a movie, read a book, listen to music, or spend time with a friend.  ?? If you think you are depressed, talk to your doctor about treatment.  ?? Keep a daily pain diary. Record how your moods, thoughts, sleep patterns, activities, and medicine affect your pain. You may find that your pain is worse during or after certain activities or when you are feeling a certain emotion. Having a record of your pain can help you and your doctor find the best ways to treat your pain.  ?? Take pain medicines exactly as directed.  ?? If the doctor gave you a prescription medicine for pain, take it as prescribed.  ?? If you are not taking a prescription pain medicine, ask your doctor if you can take an over-the-counter medicine.  Reducing constipation caused by pain medicine  ?? Include fruits, vegetables, beans, and whole grains in your diet each day. These foods are high in fiber.  ?? Drink plenty of fluids, enough so that your urine is light yellow or clear like water. If you   have kidney, heart, or liver disease and have to limit fluids, talk with your doctor before you increase the amount of fluids you drink.  ?? If your doctor recommends it, get more exercise. Walking is a good choice. Bit by bit, increase the amount you walk every day. Try for at least 30 minutes on most days of the week.  ?? Schedule time each day for a bowel movement. A daily routine may help. Take your time and do not strain when having a bowel movement.  When should you call for help?  Call your doctor now or seek immediate medical care if:  ?? Your pain gets worse or is out of control.   ?? You feel down or blue, or you do not enjoy things like you once did. You may be depressed, which is common in people with chronic pain. Depression can be treated.  ?? You have vomiting or cramps for more than 2 hours.  Watch closely for changes in your health, and be sure to contact your doctor if:  ?? You cannot sleep because of pain.  ?? You are very worried or anxious about your pain.  ?? You have trouble taking your pain medicine.  ?? You have any concerns about your pain medicine.  ?? You have trouble with bowel movements, such as:  ?? No bowel movement in 3 days.  ?? Blood in the anal area, in your stool, or on the toilet paper.  ?? Diarrhea for more than 24 hours.  Where can you learn more?  Go to [employer]  Enter N004 in the search box to learn more about "Chronic Pain: Care Instructions."  ?? 2006-2016 [employer]. Care instructions adapted under license by [employer] (which disclaims liability or warranty for this information). This care instruction is for use with your licensed healthcare professional. If you have questions about a medical condition or this instruction, always ask your healthcare professional. [employer] disclaims any warranty or liability for your use of this information.  Content Version: 10.9.538570; Current as of: December 17, 2014        High Cholesterol: Care Instructions  Your Care Instructions  Cholesterol is a type of fat in your blood. It is needed for many body functions, such as making new cells. Cholesterol is made by your body. It also comes from food you eat. High cholesterol means that you have too much of the fat in your blood. This raises your risk of a heart attack and stroke.  LDL and HDL are part of your total cholesterol. LDL is the "bad" cholesterol. High LDL can raise your risk for heart disease, heart attack, and stroke. HDL is the "good" cholesterol. It helps clear bad cholesterol  from the body. High HDL is linked with a lower risk of heart disease, heart attack, and stroke.  Your cholesterol levels help your doctor find out your risk for having a heart attack or stroke. You and your doctor can talk about whether you need to lower your risk and what treatment is best for you.  A heart-healthy lifestyle along with medicines can help lower your cholesterol and your risk. The way you choose to lower your risk will depend on how high your risk is for heart attack and stroke. It will also depend on how you feel about taking medicines.  Follow-up care is a key part of your treatment and safety. Be sure to make and go to all appointments, and call your doctor if you are  having problems. It's also a good idea to know your test results and keep a list of the medicines you take.  How can you care for yourself at home?  ?? Eat a variety of foods every day. Good choices include fruits, vegetables, whole grains (like oatmeal), dried beans and peas, nuts and seeds, soy products (like tofu), and fat-free or low-fat dairy products.  ?? Replace butter, margarine, and hydrogenated or partially hydrogenated oils with olive and canola oils. (Canola oil margarine without trans fat is fine.)  ?? Replace red meat with fish, poultry, and soy protein (like tofu).  ?? Limit processed and packaged foods like chips, crackers, and cookies.  ?? Bake, broil, or steam foods. Don't fry them.  ?? Be physically active. Get at least 30 minutes of exercise on most days of the week. Walking is a good choice. You also may want to do other activities, such as running, swimming, cycling, or playing tennis or team sports.  ?? Stay at a healthy weight or lose weight by making the changes in eating and physical activity listed above. Losing just a small amount of weight, even 5 to 10 pounds, can reduce your risk for having a heart attack or stroke.  ?? Do not smoke.  When should you call for help?   Watch closely for changes in your health, and be sure to contact your doctor if:  ?? You need help making lifestyle changes.  ?? You have questions about your medicine.  Where can you learn more?  Go to [employer]  Enter [phone] in the search box to learn more about "High Cholesterol: Care Instructions."  ?? 2006-2016 [employer]. Care instructions adapted under license by [employer] (which disclaims liability or warranty for this information). This care instruction is for use with your licensed healthcare professional. If you have questions about a medical condition or this instruction, always ask your healthcare professional. [employer] disclaims any warranty or liability for your use of this information.  Content Version: 10.9.538570; Current as of: November 24, 2014        Learning About High Blood Pressure  What is high blood pressure?     Blood pressure is a measure of how hard the blood pushes against the walls of your arteries. It's normal for blood pressure to go up and down throughout the day, but if it stays up, you have high blood pressure. Another name for high blood pressure is hypertension.  Two numbers tell you your blood pressure. The first number is the systolic pressure. It shows how hard the blood pushes when your heart is pumping. The second number is the diastolic pressure. It shows how hard the blood pushes between heartbeats, when your heart is relaxed and filling with blood.  A blood pressure of less than 120/80 (say "120 over 80") is ideal for an adult. High blood pressure is 140/90 or higher. You have high blood pressure if your top number is 140 or higher or your bottom number is 90 or higher, or both. Many people fall into the category in between, called prehypertension. People with prehypertension need to make lifestyle changes to bring their blood pressure down and help prevent or delay high blood pressure.   What happens when you have high blood pressure?  ?? Blood flows through your arteries with too much force. Over time, this damages the walls of your arteries. But you can't feel it. High blood pressure usually doesn't cause symptoms.  ?? Fat and calcium  start to build up in your arteries. This buildup is called plaque. Plaque makes your arteries narrower and stiffer. Blood can't flow through them as easily.  ?? This lack of good blood flow starts to damage some of the organs in your body. This can lead to problems such as coronary artery disease and heart attack, heart failure, stroke, kidney failure, and eye damage.  How can you prevent high blood pressure?  ?? Stay at a healthy weight.  ?? Try to limit how much sodium you eat to less than 2,300 milligrams (mg) a day. And try to limit the sodium you eat to less than 1,500 mg a day if you are 51 or older, are black, or have high blood pressure, diabetes, or chronic kidney disease.  ?? Buy foods that are labeled "unsalted," "sodium-free," or "low-sodium." Foods labeled "reduced-sodium" and "light sodium" may still have too much sodium.  ?? Flavor your food with garlic, lemon juice, onion, vinegar, herbs, and spices instead of salt. Do not use soy sauce, steak sauce, onion salt, garlic salt, mustard, or ketchup on your food.  ?? Use less salt (or none) when recipes call for it. You can often use half the salt a recipe calls for without losing flavor.  ?? Be physically active. Get at least 30 minutes of exercise on most days of the week. Walking is a good choice. You also may want to do other activities, such as running, swimming, cycling, or playing tennis or team sports.  ?? Limit alcohol to 2 drinks a day for men and 1 drink a day for women.  ?? Eat plenty of fruits, vegetables, and low-fat dairy products. Eat less saturated and total fats.  How is high blood pressure treated?  ?? Your doctor will suggest making lifestyle changes. For example, your  doctor may ask you to eat healthy foods, quit smoking, lose extra weight, and be more active.  ?? If lifestyle changes don't help enough or your blood pressure is very high, you will have to take medicine every day.  Follow-up care is a key part of your treatment and safety. Be sure to make and go to all appointments, and call your doctor if you are having problems. It's also a good idea to know your test results and keep a list of the medicines you take.  Where can you learn more?  Go to [email or web address]/[email or web address]  Enter P501 in the search box to learn more about "Learning About High Blood Pressure."  ?? 2006-2016 [employer]. Care instructions adapted under license by [employer] (which disclaims liability or warranty for this information). This care instruction is for use with your licensed healthcare professional. If you have questions about a medical condition or this instruction, always ask your healthcare professional. [employer] disclaims any warranty or liability for your use of this information.  Content Version: 10.9.538570; Current as of: November 24, 2014        Hypogonadism: Care Instructions  Your Care Instructions  Men who have hypogonadism do not make enough testosterone. This hormone allows men to make sperm and to have normal physical male traits. The condition also is known as testosterone deficiency. It can lead to loss of sex drive, weakness, impotence, infertility, and weakened bones.  Many things can cause this condition. Causes include injured testicles, certain medicines, an infection, and aging. Having a long-term health problem such as kidney or liver disease or being obese can cause it. So can surgery or radiation treatment for another  health problem. It also can be present at birth. It is most often treated with testosterone hormone. You can get the hormone as a shot or through a patch or gel on the skin.   Follow-up care is a key part of your treatment and safety. Be sure to make and go to all appointments, and call your doctor if you are having problems. It's also a good idea to know your test results and keep a list of the medicines you take.  How can you care for yourself at home?  ?? Take your medicines exactly as prescribed. Call your doctor if you think you are having a problem with your medicine. You will get more details on the specific medicines your doctor prescribes.  ?? Follow your treatment plan. If you use testosterone hormones, follow your doctor's instructions. Hormones can help relieve many of the effects of this condition, such as impotence. But it may take weeks or months for your symptoms to improve.  ?? Get plenty of exercise. And make sure to get plenty of calcium and vitamin D in your diet. Eat more dairy foods and green vegetables. They can help keep your bones from getting weak.  ?? If you have a hard time dealing with this condition, talk to your doctor about joining a support group. Talking with others who have the same problems can help you cope.  When should you call for help?  Call your doctor now or seek immediate medical care if:  ?? You have headaches.  ?? You have problems with your vision.  Watch closely for changes in your health, and be sure to contact your doctor if:  ?? You have trouble getting or keeping an erection.  ?? You have a loss of body hair.  ?? You feel weak or tired a lot of the time.  ?? Your breasts are getting larger.  ?? You do not get better as expected.  Where can you learn more?  Go to [employer]  Enter V434 in the search box to learn more about "Hypogonadism: Care Instructions."  ?? 2006-2016 [employer]. Care instructions adapted under license by [employer] (which disclaims liability or warranty for this information). This care instruction is for use with your licensed  healthcare professional. If you have questions about a medical condition or this instruction, always ask your healthcare professional. [employer] disclaims any warranty or liability for your use of this information.  Content Version: 10.9.538570; Current as of: September 17, 2014        Learning About Vitamin D  Why is it important to get enough vitamin D?  Your body needs vitamin D to absorb calcium. Calcium keeps your bones and muscles, including your heart, healthy and strong. If your muscles don't get enough calcium, they can cramp, hurt, or feel weak. You may have long-term (chronic) muscle aches and pains.  If you don't get enough vitamin D throughout life, you have an increased chance of having thin and brittle bones (osteoporosis) in your later years. Children who don't get enough vitamin D may not grow as much as others their age. They also have a chance of getting a rare disease called rickets. It causes weak bones.  Vitamin D and calcium are added to many foods. And your body uses sunshine to make its own vitamin D.  How much vitamin D do you need?  The Institute of Medicine recommends that people ages 1 through 15 get 600 IU (international units) every day. Adults  71 and older need 800 IU every day.  Blood tests for vitamin D can check your vitamin D level. But there is no standard normal range used by all laboratories. The Institute of Medicine recommends a blood level of 20 ng/mL of vitamin D for healthy bones. And most people in the [place] and [place] meet this goal.  How can you get more vitamin D?  Foods that contain vitamin D include:  ?? Salmon, tuna, and mackerel. These are some of the best foods to eat when you need to get more vitamin D.  ?? Cheese, egg yolks, and beef liver. These foods have vitamin D in small amounts.  ?? Milk, soy drinks, orange juice, yogurt, margarine, and some kinds of cereal have vitamin D added to them.   Some people don't make vitamin D as well as others. They may have to take extra care in getting enough vitamin D.  Things that reduce how much vitamin D your body makes include:  ?? Dark skin, such as many African Americans have.  ?? Age, especially if you are older than 13.  ?? Digestive problems, such as Crohn's or celiac disease.  ?? Liver and kidney disease.  Some people who do not get enough vitamin D may need supplements.  Are there any risks from taking vitamin D?  ?? Too much vitamin D:  ?? Can damage your kidneys.  ?? Can cause nausea and vomiting, constipation, and weakness.  ?? Raises the amount of calcium in your blood. If this happens, you can get confused or have an irregular heart rhythm.  ?? Vitamin D may interact with other medicines. Tell your doctor about all of the medicines you take, including over-the-counter drugs, herbs, and pills. Tell your doctor about all of your current medical problems.  Where can you learn more?  Go to [employer]  Enter V530 in the search box to learn more about "Learning About Vitamin D."  ?? 2006-2016 [employer]. Care instructions adapted under license by [employer] (which disclaims liability or warranty for this information). This care instruction is for use with your licensed healthcare professional. If you have questions about a medical condition or this instruction, always ask your healthcare professional. [employer] disclaims any warranty or liability for your use of this information.  Content Version: 10.9.538570; Current as of: September 17, 2014        Insulin Resistance: Care Instructions  Your Care Instructions  Insulin resistance means that the body cannot use insulin properly. Insulin lets sugar (glucose) enter the body's cells, where it is used for energy. It also helps muscles, fat, and liver cells store sugar to be released when needed. If the body tissues do not respond properly to  insulin, the blood sugar level rises.  Insulin resistance mainly is caused by obesity, although other medical conditions, such as acromegaly and Cushing's syndrome, also can cause it. It can run in families too.  Follow-up care is a key part of your treatment and safety. Be sure to make and go to all appointments, and call your doctor if you are having problems. It's also a good idea to know your test results and keep a list of the medicines you take.  How can you care for yourself at home?  ?? Take your medicines exactly as prescribed. Call your doctor if you think you are having a problem with your medicine. You will get more details on the specific medicines your doctor prescribes.  ?? Eat a  good diet that spreads carbohydrate throughout the day.  ?? Get at least 30 minutes of exercise on most days of the week. Exercise helps control your blood sugar. It also helps you maintain a healthy weight. Walking is a good choice. You also may want to do other activities, such as running, swimming, cycling, or playing tennis or team sports.  ?? Try to lose weight. Losing even a small amount of weight can help.  ?? Do not smoke. If you need help quitting, talk to your doctor about stop-smoking programs and medicines. These can increase your chances of quitting for good.  When should you call for help?  Call 911 anytime you think you may need emergency care. For example, call if:  ?? You have symptoms of a heart attack. These may include:  ?? Chest pain or pressure, or a strange feeling in the chest.  ?? Sweating.  ?? Shortness of breath.  ?? Pain, pressure, or a strange feeling in the back, neck, jaw, or upper belly or in one or both shoulders or arms.  ?? Lightheadedness or sudden weakness.  ?? A fast or irregular heartbeat.  After you call 911, the operator may tell you to chew 1 adult-strength or 2 to 4 low-dose aspirin. Wait for an ambulance. Do not try to drive yourself.  ?? You have symptoms of a stroke. These may include:   ?? Sudden numbness, tingling, weakness, or loss of movement in your face, arm, or leg, especially on only one side of your body.  ?? Sudden vision changes.  ?? Sudden trouble speaking.  ?? Sudden confusion or trouble understanding simple statements.  ?? Sudden problems with walking or balance.  ?? A sudden, severe headache that is different from past headaches.  Call your doctor now or seek immediate medical care if:  ?? You have any symptoms of diabetes. These may include:  ?? Being thirsty more often.  ?? Urinating more.  ?? Being hungrier.  ?? Losing weight.  ?? Being very tired.  ?? Having blurry vision.  ?? You have a wound that will not heal.  ?? You have an infection that will not go away.  ?? You have problems with your blood pressure.  Watch closely for changes in your health, and be sure to contact your doctor if you have any problems.  Where can you learn more?  Go to [employer]  Enter V537 in the search box to learn more about "Insulin Resistance: Care Instructions."  ?? 2006-2016 [employer]. Care instructions adapted under license by [employer] (which disclaims liability or warranty for this information). This care instruction is for use with your licensed healthcare professional. If you have questions about a medical condition or this instruction, always ask your healthcare professional. [employer] disclaims any warranty or liability for your use of this information.  Content Version: 10.9.538570; Current as of: June 07, 2014        Diet and Exercise for Metabolic Syndrome: Care Instructions  Your Care Instructions  Metabolic syndrome is the name for a group of health problems. It includes having too much fat around your waist and high blood pressure. It also includes high triglycerides, high blood sugar, and low levels of healthy (HDL) cholesterol. These problems make it more likely you will have a heart attack or stroke or get diabetes.   Your family history (your genes) can cause metabolic syndrome. So can unhealthy eating habits and not getting enough exercise.  You can help lower  your risk of heart attack, stroke, and diabetes if you eat healthy foods and get more exercise. It may be hard to make these lifestyle changes. But even small changes can help.  Follow-up care is a key part of your treatment and safety. Be sure to make and go to all appointments, and call your doctor if you are having problems. It's also a good idea to know your test results and keep a list of the medicines you take.  How can you care for yourself at home?  Eat more fruits and vegetables  ?? Fruits and vegetables have nutrients to help protect you from heart disease and high blood pressure. They are low in fat and high in fiber. Dark green, orange, and yellow ones are the healthiest.  ?? Keep lots of vegetables ready for snacks.  ?? Buy fruit that is in season. Then put it where you can see it so you will want to eat it.  ?? Cook dishes that have a lot of vegetables. Soups and stir-fries are good choices.  Limit saturated and trans fats  ?? Read food labels, and try to avoid saturated and trans fats. They increase your risk of heart disease.  ?? Use olive or canola oil when you cook.  ?? Bake, broil, grill, or steam foods. Avoid fried foods.  ?? Limit how much high-fat meat you eat. This includes hot dogs and sausages. When you prepare meat, cut off all the fat.  ?? You can replace high-fat meat with fish and skinless poultry. You can also try products made from soybeans, like tofu. Soybeans may be very good for your heart.  ?? Eat at least 2 servings of fish a week. Fish with omega-3 fatty acids may help reduce your risk of heart attack. Salmon and sardines are good examples.  ?? Choose low-fat or fat-free milk and dairy products.  Eat foods high in fiber  ?? Foods high in fiber may reduce your cholesterol. And they may give you  important vitamins and minerals. Good examples are oatmeal, cooked dried beans, brown rice, citrus fruits, and apples.  ?? Eat whole-grain breads and cereals. They have more fiber than white bread or pastries.  Limit high-sugar foods  ?? Limit foods and drinks that are high in sugar. Some examples are soda pop, sugar-sweetened fruit drinks, candy, and many desserts.  ?? Limit the amount of sugar, honey, and other sweeteners that you add to food and drinks.  ?? Choose water instead of soda pop or other sugar-sweetened drinks.  ?? Limit fruit juice.  Limit salt and sodium  ?? You can help lower your blood pressure if you limit salt and sodium.  ?? If you take the salt shaker off the table, it may be easier to use less salt. You can also try using half the salt in a recipe. And you can avoid adding salt to cooking water for pasta, rice, and potatoes.  ?? Try to eat fewer snacks, fast foods, and other high-salt, processed foods. Check labels so you know how much sodium a food has.  ?? Choose canned goods (soups, vegetables, and beans) that are low in sodium.  Get regular exercise  ?? Get more exercise. Make sure your doctor knows when you start a new exercise program. Even small amounts of exercise will help you get stronger and have more energy. It can also help you manage your weight and your stress.  ?? Walking is a good choice. Little by little, increase the amount  you walk every day. Try for at least 30 minutes on most days of the week.  Where can you learn more?  Go to [employer]  Enter H930 in the search box to learn more about "Diet and Exercise for Metabolic Syndrome: Care Instructions."  ?? 2006-2016 [employer]. Care instructions adapted under license by [employer] (which disclaims liability or warranty for this information). This care instruction is for use with your licensed healthcare professional. If you have questions about a medical condition  or this instruction, always ask your healthcare professional. [employer], Inc[employer] disclaims any warranty or liability for your use of this information.  Content Version: 10.9.538570; Current as of: September 17, 2014

## 2015-04-29 NOTE — Progress Notes (Signed)
HISTORY OF PRESENT ILLNESS  Steven [name] is a 59 y.o. male.  HPI The patient is a 59 y.o. male who is seen for follow up of Hyperlipidemia, HTN, Prediabetes, Vit D Deficiency, Hypogonadism and Obesity.  Symptoms are stable and well controlled.  The patient is having difficulty following treatment regimen, patient has not improved significantly, no reported side effects of regimen. He is not taking Lamictal or Topamax because he doesn't want to.  He  is following an appropriate diet and is not exercising regularly.         Steven [name] tells me that he has been going to see Steven [name] at Steven [name] office and that he has had 2 epidural steroid injections and neither one helped to decrease is LBP and LLE pain for more than a week. He will see Steven [name] again soon and he is expecting that Steven [name] will have him see Steven [name] next.    Review of Systems   Constitutional: Positive for weight loss and malaise/fatigue.   HENT: Negative for congestion.    Eyes: Negative for blurred vision and pain.   Respiratory: Negative for cough and shortness of breath.    Cardiovascular: Negative for chest pain, palpitations and leg swelling.   Gastrointestinal: Negative for heartburn, nausea, vomiting, abdominal pain, diarrhea and constipation.   Genitourinary: Negative for dysuria and frequency.   Musculoskeletal: Positive for myalgias, back pain (Chronic low back pain that radiates down his LLE) and joint pain (chronic left hip pain).   Skin: Negative for rash.   Neurological: Negative for dizziness, sensory change, focal weakness, weakness and headaches.   Endo/Heme/Allergies: Negative for environmental allergies and polydipsia.   Psychiatric/Behavioral: Negative for depression, suicidal ideas and substance abuse. The patient is nervous/anxious (He has not had any panic attacks since his last visit.). The patient does not have insomnia.    All other systems reviewed and are negative.         BP 153/88 mmHg   Pulse 85   Temp(Src) 98 ??F (36.7 ??C) (Oral)   Ht  (1.803 m)   Wt 275 lb (124.739 kg)   BMI 38.37 kg/m2   SpO2 98%  He has lost 14 lb since 01/27/15.      Physical Exam   Constitutional: He is oriented to person, place, and time. He appears well-developed and well-nourished. He does not appear ill. No distress.   HENT:   Head: Normocephalic and atraumatic.   Eyes: Conjunctivae are normal. No scleral icterus.   Neck: Normal range of motion and phonation normal. Neck supple.   Cardiovascular: Normal rate, regular rhythm, normal heart sounds, intact distal pulses and normal pulses.    No peripheral edema noted.   Pulmonary/Chest: Effort normal and breath sounds normal.   Neurological: He is alert and oriented to person, place, and time. He displays no tremor. No sensory deficit. He exhibits normal muscle tone. Gait (he ambulates slowly leaning forward with an antalgic gait d/t his chronic low back pain that radiates down his LLE ) abnormal.   Skin: Skin is warm and dry. No rash noted.   Psychiatric: His speech is normal and behavior is normal. Judgment and thought content normal. His mood appears anxious (much improved). His affect is not angry, not blunt, not labile and not inappropriate. Cognition and memory are normal. He does not exhibit a depressed mood.   Nursing note and vitals reviewed.      ASSESSMENT and PLAN    ICD-10-CM ICD-9-CM  1. Pure hypercholesterolemia E78.0 272.0 METABOLIC PANEL, COMPREHENSIVE      LIPID PANEL      pravastatin (PRAVACHOL) 40 mg tablet   2. Pre-diabetes R73.09 790.29 MICROALBUMIN, UR, RAND W/ MICROALBUMIN/CREA RATIO      HEMOGLOBIN A1C WITH EAG      metFORMIN (GLUCOPHAGE) 1,000 mg tablet   3. Essential hypertension, benign I10 401.1 AMB POC COMPLETE CBC,AUTOMATED ENTER      AMB POC URINALYSIS DIP STICK MANUAL W/ MICRO      TSH RFX ON ABNORMAL TO FREE T4      valsartan-hydrochlorothiazide (DIOVAN-HCT) 320-12.5 mg per tablet       metoprolol succinate (TOPROL-XL) 100 mg tablet   4. Vitamin D deficiency E55.9 268.9 VITAMIN D, 25 HYDROXY      DISCONTINUED: ergocalciferol (ERGOCALCIFEROL) 50,000 unit capsule   5. 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 Obesity (BMI 30-39.9) E66.9 278.00 phentermine (ADIPEX-P) 37.5 mg tablet   Labs checked and meds refilled as she is feeling well on her current regimen. His BP is not at goal so I have increased his Toprol-XL dose to 100 mg po qhs. He will call for refills when he needs them. He was encouraged to strictly follow a low cholesterol, low glycemic index, high protein diet and to get 30 minutes of CV exercise 4-5 days a week. He will RTO in 6 months for a recheck and repeat fasting labs or sooner prn.

## 2015-04-29 NOTE — Addendum Note (Signed)
Addended by: Delman KittenBENSON, Kyliee Ortego K on: 04/29/2015 06:33 AM      Modules accepted: Orders

## 2015-04-30 LAB — SPECIMEN STATUS REPORT

## 2015-04-30 LAB — TSH RFX ON ABNORMAL TO FREE T4: TSH: 2.76 u[IU]/mL (ref 0.450–4.500)

## 2015-05-02 MED ORDER — METFORMIN 1,000 MG TAB
1000 mg | ORAL_TABLET | ORAL | Status: DC
Start: 2015-05-02 — End: 2015-11-18

## 2015-05-03 ENCOUNTER — Inpatient Hospital Stay: Payer: PRIVATE HEALTH INSURANCE | Primary: Family Medicine

## 2015-05-03 NOTE — Other (Addendum)
Pt is no show for appointment. States he was not notified of his appointment time. He will call and reschedule.

## 2015-05-05 ENCOUNTER — Inpatient Hospital Stay: Primary: Family Medicine

## 2015-05-05 NOTE — Other (Signed)
Phone Assessment  Patient verified name, DOB, and surgery as listed in [facility].    TYPE  CASE:1B            Orders: received.  Labs per surgeon:  none   Labs per anesthesia protocol: potassium 4.3  04/28/15  EKG  :  Not required, however pt hx of abnormal ekg at PCP office, per pt "they hooked up the leads wrong". Requested most recent EKG, Echo and office note from [facility]. Awaiting fax.    Instructed patient to continue previous medications as prescribed prior to surgery and  to take the following medications the day of surgery according to anesthesia guidelines with a small sip of water : aspirin 81 mg & omeprazole  (takes metoprolol in the evening)       Continue all previous medications unless otherwise directed.     Instructed patient to hold  the following medications prior to surgery: mvi    Patient instructed on the following and verbalized understanding:  [facility], time of arrival to be called the day before by 1700.  Responsible adult must drive patient to and from hospital, stay during surgery and 24 hours postoperatively.  Npo after midnight including gum, mints and ice chips.  Use hibiclens in the shower the night before and the morning of surgery.  Leave all valuables at home. Instructed on no jewelry or body piercings on the dos.  Bring insurance card and ID.  No perfumes, oil, powder, colognes, makeup or  lotions on the skin.    Patient verbalized understanding of all instructions and provided all medical/health information to the best of their ability.

## 2015-05-05 NOTE — Other (Signed)
EKG 08/31/13, Echo 07/22/13 and office note 09/01/13 reviewed and placed on chart.

## 2015-05-06 ENCOUNTER — Inpatient Hospital Stay: Payer: PRIVATE HEALTH INSURANCE

## 2015-05-06 LAB — GLUCOSE, POC: Glucose (POC): 112 mg/dL — ABNORMAL HIGH (ref 65–100)

## 2015-05-06 MED ORDER — LIDOCAINE (PF) 20 MG/ML (2 %) IJ SOLN
20 mg/mL (2 %) | INTRAMUSCULAR | Status: DC | PRN
Start: 2015-05-06 — End: 2015-05-06
  Administered 2015-05-06: 16:00:00 via INTRAVENOUS

## 2015-05-06 MED ORDER — SODIUM CHLORIDE 0.9 % IJ SYRG
INTRAMUSCULAR | Status: DC | PRN
Start: 2015-05-06 — End: 2015-05-06

## 2015-05-06 MED ORDER — MIDAZOLAM 1 MG/ML IJ SOLN
1 mg/mL | Freq: Once | INTRAMUSCULAR | Status: DC | PRN
Start: 2015-05-06 — End: 2015-05-06
  Administered 2015-05-06: 16:00:00 via INTRAVENOUS

## 2015-05-06 MED ORDER — LACTATED RINGERS IV
INTRAVENOUS | Status: DC
Start: 2015-05-06 — End: 2015-05-06

## 2015-05-06 MED ORDER — HYDROMORPHONE (PF) 2 MG/ML IJ SOLN
2 mg/mL | INTRAMUSCULAR | Status: DC | PRN
Start: 2015-05-06 — End: 2015-05-06

## 2015-05-06 MED ORDER — NALOXONE 0.4 MG/ML INJECTION
0.4 mg/mL | INTRAMUSCULAR | Status: DC | PRN
Start: 2015-05-06 — End: 2015-05-06

## 2015-05-06 MED ORDER — FENTANYL CITRATE (PF) 50 MCG/ML IJ SOLN
50 mcg/mL | INTRAMUSCULAR | Status: AC
Start: 2015-05-06 — End: ?

## 2015-05-06 MED ORDER — PROPOFOL 10 MG/ML IV EMUL
10 mg/mL | INTRAVENOUS | Status: AC
Start: 2015-05-06 — End: ?

## 2015-05-06 MED ORDER — OXYCODONE-ACETAMINOPHEN 5 MG-325 MG TAB
5-325 mg | ORAL | Status: DC | PRN
Start: 2015-05-06 — End: 2015-05-06

## 2015-05-06 MED ORDER — ONDANSETRON (PF) 4 MG/2 ML INJECTION
4 mg/2 mL | INTRAMUSCULAR | Status: AC
Start: 2015-05-06 — End: ?

## 2015-05-06 MED ORDER — FAMOTIDINE 20 MG TAB
20 mg | Freq: Once | ORAL | Status: AC
Start: 2015-05-06 — End: 2015-05-06
  Administered 2015-05-06: 15:00:00 via ORAL

## 2015-05-06 MED ORDER — LIDOCAINE HCL 1 % (10 MG/ML) IJ SOLN
10 mg/mL (1 %) | INTRAMUSCULAR | Status: DC | PRN
Start: 2015-05-06 — End: 2015-05-06

## 2015-05-06 MED ORDER — FENTANYL CITRATE (PF) 50 MCG/ML IJ SOLN
50 mcg/mL | INTRAMUSCULAR | Status: DC | PRN
Start: 2015-05-06 — End: 2015-05-06
  Administered 2015-05-06 (×3): via INTRAVENOUS

## 2015-05-06 MED ORDER — PROPOFOL 10 MG/ML IV EMUL
10 mg/mL | INTRAVENOUS | Status: DC | PRN
Start: 2015-05-06 — End: 2015-05-06
  Administered 2015-05-06: 16:00:00 via INTRAVENOUS

## 2015-05-06 MED ORDER — LACTATED RINGERS IV
INTRAVENOUS | Status: DC
Start: 2015-05-06 — End: 2015-05-06
  Administered 2015-05-06: 15:00:00 via INTRAVENOUS

## 2015-05-06 MED ORDER — ROPIVACAINE (PF) 5 MG/ML (0.5 %) INJECTION
5 mg/mL (0. %) | INTRAMUSCULAR | Status: DC | PRN
Start: 2015-05-06 — End: 2015-05-06
  Administered 2015-05-06: 16:00:00

## 2015-05-06 MED ORDER — ROPIVACAINE (PF) 5 MG/ML (0.5 %) INJECTION
5 mg/mL (0. %) | INTRAMUSCULAR | Status: AC
Start: 2015-05-06 — End: ?

## 2015-05-06 MED ORDER — ONDANSETRON (PF) 4 MG/2 ML INJECTION
4 mg/2 mL | INTRAMUSCULAR | Status: DC | PRN
Start: 2015-05-06 — End: 2015-05-06
  Administered 2015-05-06: 16:00:00 via INTRAVENOUS

## 2015-05-06 MED FILL — ONDANSETRON (PF) 4 MG/2 ML INJECTION: 4 mg/2 mL | INTRAMUSCULAR | Qty: 2

## 2015-05-06 MED FILL — FENTANYL CITRATE (PF) 50 MCG/ML IJ SOLN: 50 mcg/mL | INTRAMUSCULAR | Qty: 2

## 2015-05-06 MED FILL — FAMOTIDINE 20 MG TAB: 20 mg | ORAL | Qty: 1

## 2015-05-06 MED FILL — PROPOFOL 10 MG/ML IV EMUL: 10 mg/mL | INTRAVENOUS | Qty: 200

## 2015-05-06 MED FILL — LIDOCAINE (PF) 20 MG/ML (2 %) IJ SOLN: 20 mg/mL (2 %) | INTRAMUSCULAR | Qty: 100

## 2015-05-06 MED FILL — ONDANSETRON (PF) 4 MG/2 ML INJECTION: 4 mg/2 mL | INTRAMUSCULAR | Qty: 4

## 2015-05-06 MED FILL — MIDAZOLAM 1 MG/ML IJ SOLN: 1 mg/mL | INTRAMUSCULAR | Qty: 2

## 2015-05-06 MED FILL — NAROPIN (PF) 5 MG/ML (0.5 %) INJECTION SOLUTION: 5 mg/mL (0. %) | INTRAMUSCULAR | Qty: 30

## 2015-05-06 MED FILL — PROPOFOL 10 MG/ML IV EMUL: 10 mg/mL | INTRAVENOUS | Qty: 20

## 2015-05-06 NOTE — Op Note (Signed)
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    RIGHT KNEE POSTOPERATIVE DIAGNOSES   1. Tear of the medial meniscal root of the right knee.   2. Large macerated complex tear of the lateral meniscus.   3. 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   4. Mild chondromalacia of the undersurface of patella.     RIGHT KNEE OPERATION PERFORMED   1. Abrasion arthroplasty of the lateral femoral condyle.   2. Medial and lateral meniscectomy.   3. Chondroplasty of the undersurface of patella.     LEFT KNEE PREOPERATIVE DIAGNOSIS: Possible meniscal tear.     LEFT KNEE POSTOPERATIVE DIAGNOSES   1. Tear of the posterior horn meniscal root of the left knee.   2. Small lateral meniscal tear.   3. Anterior cruciate cyst.     LEFT KNEE OPERATION PERFORMED   1. Arthroscopic medial and lateral meniscectomy.   2. Synovectomy and removal of cyst on the anterior cruciate.     SURGEON: Steven Ra, MD     ANESTHESIA: General.     PROCEDURE: After an adequate level of general anesthesia was   obtained, both legs were prepped and draped in the usual sterile   fashion. The right knee was arthroscoped. Anteromedial and   anterolateral portals made. Had good stability of both knees.   The right knee was arthroscoped first and the arthroscopy was   performed in the same fashion on both knees. Photos taken.   Findings revealed some mild chondromalacia of the undersurface   of the patella. A chondroplasty was performed. There were no   full-thickness lesions or loose debris. The gutters were clear.   It looked as though he may have had a stretch injury of the ACL,   but it was not grossly unstable. He had just a little partial   tearing, but overall good integrity. In the medial compartment,   the medial  meniscus was stressed and probed and stable. There   was a tear of the posterior horn with the meniscal root type   injury resected with the baskets and the shaver and was left   with a stable rim. In the lateral compartment, the patient had a   large macerated tear of the posterior 1/2 of the lateral   meniscus. The tear was resected with the baskets and the shaver.   However, he was getting thinning on the lateral femoral condyle   with just a very small, dime sized area of grade 3   chondromalacia centrally and an abrasion arthroplasty was   performed to bleeding bone in this area. The knee was then   copiously irrigated. The  Other  knee was arthroscoped in the same   fashion. This was performed through anteromedial and   anterolateral portals. On the left knee with arthroscopy,   findings revealed some mild chondromalacia of the undersurface   of the patella. Chondroplasty was performed. The ACL and the PCL   intact. There was a small tear of the posterior horn of the   medial meniscus at the meniscal root, resected with the basket   and the shaver, but the articular surface  looked good. In the   lateral compartment, there was a small radial tear of the   central third of the lateral meniscus resected with the basket   and the shaver, but the lateral compartment looked fine. The   gutters were clear. Photos made for the patient. The knee was   then copiously irrigated. Sterile dressings were then applied.        Steven RaJOHN R. Macarthur Lorusso, MD      JRV / DLR   D:  05/06/2015   12:37   T:  05/06/2015   13:12   Job #:  [id number]

## 2015-05-06 NOTE — Anesthesia Post-Procedure Evaluation (Signed)
Post-Anesthesia Evaluation and Assessment    Patient: Steven DuboisMichael L Rimmer MRN: [id number]  SSN: [id number]    Date of Birth: 1956-04-29  Age: 59 y.o.  Sex: male       Cardiovascular Function/Vital Signs  Visit Vitals   Item Reading   ??? BP 120/68 mmHg   ??? Pulse 81   ??? Temp 36.8 ??C (98.2 ??F)   ??? Resp 37   ??? SpO2 93%       Patient is status post general anesthesia for Procedure(s):  RIGHT KNEE ARTHROSCOPY PARTIAL MEDIAL AND LATERAL MENISCECTOMIES/ ABRASION CHONDROPLASTY LEFT KNEE ARTHROSCOPY/ PARTIAL MEDIAL AND LATERAL MENISCECTOMIES .    Nausea/Vomiting: None    Postoperative hydration reviewed and adequate.    Pain:  Pain Scale 1: Visual (05/06/15 1244)  Pain Intensity 1: 0 (05/06/15 1244)   Managed    Neurological Status:   Neuro (WDL): Exceptions to WDL (05/06/15 1244)  Neuro  Neurologic State: Anesthetized;Eyes do not open to any stimulus (05/06/15 1244)   At baseline    Mental Status and Level of Consciousness: Alert and oriented     Pulmonary Status:   O2 Device: Nasal cannula (05/06/15 1244)   Adequate oxygenation and airway patent    Complications related to anesthesia: None    Post-anesthesia assessment completed. No concerns    Signed By: Deitra MayoMARSHALL J Leida Luton, MD     May 06, 2015

## 2015-05-06 NOTE — H&P (Signed)
Outpatient Surgery History and Physical:  Steven [name] was seen and examined.    CHIEF COMPLAINT:    Knees .     PE:   There were no vitals taken for this visit.    Heart:   Regular rhythm      Lungs:  Are clear      Past Medical History:    Patient Active Problem List    Diagnosis   ??? Chronic radicular low back pain   ??? Chronic left sacroiliac joint pain   ??? Mood disorder (HCC)   ??? Venous insufficiency of both lower extremities   ??? Vitamin D deficiency     09/14/13 Vit D 19.1-start ergocalciferol     ??? Left lumbar radiculitis     He has had low back pain that radiates down his LLE for years.     ??? Pure hypercholesterolemia   ??? BMI 40.0-44.9, adult (HCC)   ??? Generalized anxiety disorder   ??? Other malaise and fatigue   ??? Testicular hypofunction   ??? Allergic rhinitis, cause unspecified   ??? Pre-diabetes   ??? Essential hypertension, benign       Surgical History:   Past Surgical History   Procedure Laterality Date   ??? Endoscopy, colon, diagnostic  05/16/10     Diverticulosis       Social History: Patient  reports that he has never smoked. He has never used smokeless tobacco. He reports that he drinks about 0.5 oz of alcohol per week. He reports that he does not use illicit drugs.    Family History:   Family History   Problem Relation Age of Onset   ??? Diabetes Mother    ??? Alzheimer Mother    ??? Heart Disease Father      atherosclerosis (heavy smoker)   ??? Coronary Artery Disease Neg Hx    ??? Colon Cancer Neg Hx    ??? Attention Deficit Hyperactivity Disorder Daughter        Allergies: Reviewed per EMR  Allergies   Allergen Reactions   ??? Nsaids (Non-Steroidal Anti-Inflammatory Drug) Other (comments)     Cause pedal edema if taken for a number of days in a row       Medications:    No current facility-administered medications on file prior to encounter.     Current Outpatient Prescriptions on File Prior to Encounter   Medication Sig   ??? VITAMIN D2 50,000 unit capsule TAKE 1 CAP BY MOUTH EVERY SEVEN (7) DAYS.  INDICATIONS: VITAMIN D DEFICIENCY   ??? Safety Needles 21 gauge x 1 1/2" ndle 1 Units by Does Not Apply route every seven (7) days.   ??? phentermine (ADIPEX-P) 37.5 mg tablet TAKE 1 TABLET BY MOUTH IN THE MORNING   ??? valsartan-hydrochlorothiazide (DIOVAN-HCT) 320-12.5 mg per tablet TAKE 1 TABLET BY MOUTH EVERY DAY   ??? pravastatin (PRAVACHOL) 40 mg tablet TAKE 1 TABLET BY MOUTH AT BEDTIME   ??? metoprolol succinate (TOPROL-XL) 100 mg tablet Take 1 Tab by mouth nightly. Indications: HYPERTENSION   ??? testosterone cypionate (DEPOTESTOTERONE CYPIONATE) 200 mg/mL injection INJECT 1/2 MILLILITER EVERY 7 DAYS   ??? aspirin (ASPIRIN LOW-STRENGTH) 81 mg chewable tablet Take 81 mg by mouth every morning.   ??? MULTI-VITAMIN HI-PO PO Take 1 Tab by mouth daily.       The surgery is planned for the  Knees .        History and physical has been reviewed. The patient has been examined. There  have been no significant clinical changes since the completion of the originally dated History and Physical.  Patient identified by surgeon; surgical site was confirmed by patient and surgeon.      The patient is here today for outpatient surgery. I have examined the patient, no changes are noted in the patient's medical status. Necessity for the procedure/care is still present and the history and physical above is current.  See the office notes for the full long term history of the problem.  Please see the recent office notes for the musculoskeletal examination.    Signed By: Brock Ra, MD     May 06, 2015 6:57 AM

## 2015-05-06 NOTE — Anesthesia Pre-Procedure Evaluation (Signed)
Anesthetic History   No history of anesthetic complications            Review of Systems / Medical History  Patient summary reviewed, nursing notes reviewed and pertinent labs reviewed    Pulmonary  Within defined limits        Undiagnosed apnea         Neuro/Psych              Cardiovascular    Hypertension: well controlled              Exercise tolerance: >4 METS     GI/Hepatic/Renal     GERD: well controlled           Endo/Other    Diabetes: type 2    Morbid obesity     Other Findings              Physical Exam    Airway  Mallampati: II  TM Distance: 4 - 6 cm  Neck ROM: normal range of motion   Mouth opening: Diminished (comment)     Cardiovascular    Rhythm: regular           Dental  No notable dental hx       Pulmonary  Breath sounds clear to auscultation               Abdominal         Other Findings            Anesthetic Plan    ASA: 2  Anesthesia type: general          Induction: Intravenous  Anesthetic plan and risks discussed with: Patient

## 2015-05-06 NOTE — Brief Op Note (Signed)
BRIEF OPERATIVE NOTE    Date of Procedure: 05/06/2015   Preoperative Diagnosis: Acute pain of right knee [M25.561]  Acute pain of left knee [M25.562]  Postoperative Diagnosis: Right knee medial and lateral meniscal tears/chondromalacia; Left knee medial and lateral meniscal tears    Procedure(s):  RIGHT KNEE ARTHROSCOPY PARTIAL MEDIAL AND LATERAL MENISCECTOMIES/ ABRASION CHONDROPLASTY LEFT KNEE ARTHROSCOPY/ PARTIAL MEDIAL AND LATERAL MENISCECTOMIES   Surgeon(s) and Role:     * Brock RaJohn R Seleena Reimers, MD - Primary  Circ-1: Inocente Sallesharla T Candler, RN  Scrub Tech-1: Dondra PraderKyle J Roth  Scrub Tech-2: Angeline N Sports  Scrub Tech-Relief: Dolores LoryMarcy Jo Lawter  Event Time In   Incision Start 1212   Incision Close 1233     Anesthesia: General   Estimated Blood Loss:     Specimens: * No specimens in log *   Findings:      Complications:       Implants: * No implants in log *

## 2015-05-06 NOTE — Op Note (Signed)
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    RIGHT KNEE POSTOPERATIVE DIAGNOSES   1. Tear of the medial meniscal root of the right knee.   2. Large macerated complex tear of the lateral meniscus.   3. 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   4. Mild chondromalacia of the undersurface of patella.     RIGHT KNEE OPERATION PERFORMED   1. Abrasion arthroplasty of the lateral femoral condyle.   2. Medial and lateral meniscectomy.   3. Chondroplasty of the undersurface of patella.     LEFT KNEE PREOPERATIVE DIAGNOSIS: Possible meniscal tear.     LEFT KNEE POSTOPERATIVE DIAGNOSES   1. Tear of the posterior horn meniscal root of the left knee.   2. Small lateral meniscal tear.   3. Anterior cruciate cyst.     LEFT KNEE OPERATION PERFORMED   1. Arthroscopic medial and lateral meniscectomy.   2. Synovectomy and removal of cyst on the anterior cruciate.     SURGEON: Brock RaJohn R. Manoj Enriquez, MD     ANESTHESIA: General.     PROCEDURE: After an adequate level of general anesthesia was   obtained, both legs were prepped and draped in the usual sterile   fashion. The right knee was arthroscoped. Anteromedial and   anterolateral portals made. Had good stability of both knees.   The right knee was arthroscoped first and the arthroscopy was   performed in the same fashion on both knees. Photos taken.   Findings revealed some mild chondromalacia of the undersurface   of the patella. A chondroplasty was performed. There were no   full-thickness lesions or loose debris. The gutters were clear.   It looked as though he may have had a stretch injury of the ACL,   but it was not grossly unstable. He had just a little partial   tearing, but overall good integrity. In the medial compartment,    the medial meniscus was stressed and probed and stable. There   was a tear of the posterior horn with the meniscal root type   injury resected with the baskets and the shaver and was left   with a stable rim. In the lateral compartment, the patient had a   large macerated tear of the posterior 1/2 of the lateral   meniscus. The tear was resected with the baskets and the shaver.   However, he was getting thinning on the lateral femoral condyle   with just a very small, dime sized area of grade 3   chondromalacia centrally and an abrasion arthroplasty was   performed to bleeding bone in this area. The knee was then   copiously irrigated. The  Other  knee was arthroscoped in the same   fashion. This was performed through anteromedial and   anterolateral portals. On the left knee with arthroscopy,   findings revealed some mild chondromalacia of the undersurface   of the patella. Chondroplasty was performed. The ACL and the PCL   intact. There was a small tear of the posterior horn of the   medial meniscus at the meniscal root, resected with the basket   and the shaver, but the articular surface  looked good. In the   lateral compartment, there was a small radial tear of the   central third of the lateral meniscus resected with the basket   and the shaver, but the lateral compartment looked fine. The   gutters were clear. Photos made for the patient. The knee was   then copiously irrigated. Sterile dressings were then applied.        Brock RaJOHN R. Macarthur Lorusso, MD      JRV / DLR   D:  05/06/2015   12:37   T:  05/06/2015   13:12   Job #:  [id number]

## 2015-05-17 ENCOUNTER — Inpatient Hospital Stay: Admit: 2015-05-17 | Payer: PRIVATE HEALTH INSURANCE | Attending: Orthopaedic Surgery | Primary: Family Medicine

## 2015-05-17 ENCOUNTER — Encounter

## 2015-05-17 DIAGNOSIS — M79662 Pain in left lower leg: Secondary | ICD-10-CM

## 2015-05-31 ENCOUNTER — Inpatient Hospital Stay: Admit: 2015-05-31 | Primary: Family Medicine

## 2015-06-01 ENCOUNTER — Telehealth

## 2015-06-02 DIAGNOSIS — M25562 Pain in left knee: Secondary | ICD-10-CM

## 2015-06-02 NOTE — Telephone Encounter (Signed)
He was told by the anesthesiologist who monitored him during his colonoscopy that he "turned blue" twice and that he needed to be evaluated for OSA. Referral to Sleep MD ordered. kkb

## 2015-06-03 ENCOUNTER — Inpatient Hospital Stay: Payer: PRIVATE HEALTH INSURANCE | Primary: Family Medicine

## 2015-06-06 ENCOUNTER — Inpatient Hospital Stay: Admit: 2015-06-06 | Payer: PRIVATE HEALTH INSURANCE | Primary: Family Medicine

## 2015-06-06 NOTE — Progress Notes (Signed)
[facility] at [facility] [address] [place], [place] [address]  Phone: [phone]   Fax: [phone]    OUTPATIENT ORTHOPAEDIC PHYSICAL THERAPY    NAME/AGE/GENDER: Steven [name] is a 59 y.o. male    DATE: 06/06/2015                       Ambulatory/Rehab Services H2 Model Falls Risk Assessment    Risk Factor Pts. ??   Confusion/Disorientation/Impulsivity      4 ?? Symptomatic Depression     2 ??   Altered Elimination     1 ??   Dizziness/Vertigo     1 ??   Gender (Male)     1 ??   Any administered antiepileptics (anticonvulsants):     2 ??   Any administered benzodiazepines:     1 ??   Visual Impairment (specify):     1 ??   Portable Oxygen Use     1 ??   Orthostatic ? BP     1 ??   History of Recent Falls (within 3 mos.)     5     Ability to Rise from Chair (choose one) Pts. ??   Ability to rise in a single movement     0 ??   Pushes up, successful in one attempt     1 ??   Multiple attempts, but unsuccessful     3 ??   Unable to rise without assistance     4   Total: (5 or greater = High Risk) 1     Falls Prevention Plan:                   Physical Limitations to Exercise (specify):                   Mobility Assistance Device (type):                   Exercise/Equipment Adaptation (specify):    ??2010 [employer]. All Rights Reserved. [place] Building services engineer [phone]. Federal Law prohibits the replication, distribution or use without written permission from [employer], PT

## 2015-06-06 NOTE — Progress Notes (Signed)
[facility] at [facility] [address]  [address], [address] [place], [place] [address]  Phone: [phone]   Fax: [phone]   Outpatient PHYSICAL THERAPY: Initial Assessment 06/06/2015  Fall Risk Score: 1 (? 5 = High Risk)    ICD-10: Treatment Diagnosis:   ?? Pain in left knee (M25.562)  ?? Stiffness of left knee, not elsewhere classified (Q65.784)   ?? Pain in right knee (M25.561)   ?? Stiffness of right knee, not elsewhere classified (M25.661)  REFERRING PHYSICIAN: Brock Ra, MD  MD Orders: Evaluate and Treat  Return Physician Appointment: TBD  MEDICAL/REFERRING DIAGNOSIS:  ?? Other tear of medial meniscus, current injury, right knee, initial encounter [S83.241A]  ?? Other tear of medial meniscus, current injury, left knee, initial encounter [S83.242A]  ?? Other tear of lateral meniscus, current injury, right knee, initial encounter [S83.281A]  ?? Other tear of lateral meniscus, current injury, left knee, initial encounter [S83.282A]  ?? Unilateral primary osteoarthritis, right knee [M17.11]  ?? 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 Nsaids (Non-Steroidal Anti-Inflammatory Drug) Other (comments)     Cause pedal edema if taken for a number of days in a row      ASSESSMENT:  ????????This section established at most recent assessment??????????  Patient presents with decreased mobility, decreased strength, and pain in bilateral knees secondary to s/p bilateral meniscal repair.  After discussing with patient, he agreed he would benefit from physical therapy to improve above deficits.  Please sign this plan of treatment if you concur.  Thank you for the opportunity to serve this patient.    PROBLEM LIST (Impairments causing functional limitations):  1. Decreased Strength affecting function  2. Decreased ADL/Functional Activities   3. Decreased Flexibility/joint mobility  4. Increased Pain affecting function  5. Decreased Activity tolerance   GOALS: (Goals have been discussed and agreed upon with patient.)  SHORT-TERM FUNCTIONAL GOALS: Time Frame: 4 weeks  1. Patient will be independent with home exercise program without exacerbation of symptoms or cueing needed.   DISCHARGE GOALS: Time Frame: 12 weeks  1. Patient will be independent with all ADLs with minimal onset of knee pain and no deficits with daily tasks.  2. Patient will report no fear avoidance with social or recreational activities due to bilateral knee pain.  3. Patient will score less than or equal to 60/80 on Lower Extremity Functional Scale with minimal effect of knee pain on patient's ability to manage every day life activities.   REHABILITATION POTENTIAL FOR STATED GOALS: GoodPLAN OF CARE:  INTERVENTIONS PLANNED: (Benefits and precautions of physical therapy have been discussed with the patient.)  1. balance exercise  2. cold  3. electrical stimulation  4. gait training  5. heat  6. home exercise program (HEP)  7. manual therapy  8. neuromuscular re-education/strengthening  9. range of motion: active/assisted/passive  10. therapeutic activities  11. therapeutic exercise/strengthening  12. ultrasound  TREATMENT PLAN EFFECTIVE DATES: 06/06/2015 TO 09/06/2015  FREQUENCY/DURATION: Follow patient 2 times a week for 12 weeks to address above goals.  Regarding Steven [name]'s therapy, I certify that the treatment plan above will be carried out by a therapist or under their direction.  Thank you for this referral,  Allene Dillon, PT     Referring Physician Signature: Brock Ra, MD  Date                       SUBJECTIVE:  History of Present Injury/Illness (Reason for Referral): Patient reports a chronic history of knee and back pain. He reports that he had bilateral meniscal surgery on 05/06/2015.  He reports that he had some swelling in  both legs that was painful.  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  He reports that his left knee is feeling good, just minimal soreness with activity. He reports that his right knee has been more painful and he reports that his right knee was more painful before surgery.  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  Present Symptoms: 06/06/2015: Patient reports that his right knee has some swelling in it today and so does his left ankle.   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 He also has no past medical history of Adverse effect of anesthesia, Nausea & vomiting, Difficult intubation, Malignant hyperthermia due to anesthesia, or Pseudocholinesterase deficiency.   He also  has past surgical history that includes endoscopy, colon, diagnostic (05/16/10); knee arthroscopy (Bilateral, 05/06/15); and colonoscopy (05/31/15).   Current Medications:   Medication   ??? HYDROcodone-acetaminophen (NORCO) 7.5-325 mg per tablet   ??? topiramate (TOPAMAX) 50 mg tablet   ??? lamoTRIgine (LAMICTAL) 100 mg tablet   ??? diclofenac EC (VOLTAREN) 75 mg EC tablet    ??? omeprazole (PRILOSEC) 20 mg capsule   ??? metFORMIN (GLUCOPHAGE) 1,000 mg tablet   ??? VITAMIN D2 50,000 unit capsule   ??? Safety Needles 21 gauge x 1 1/2" ndle   ??? phentermine (ADIPEX-P) 37.5 mg tablet   ??? valsartan-hydrochlorothiazide (DIOVAN-HCT) 320-12.5 mg per tablet   ??? pravastatin (PRAVACHOL) 40 mg tablet   ??? metoprolol succinate (TOPROL-XL) 100 mg tablet   ??? testosterone cypionate (DEPOTESTOTERONE CYPIONATE) 200 mg/mL injection   ??? aspirin (ASPIRIN LOW-STRENGTH) 81 mg chewable tablet   ??? MULTI-VITAMIN HI-PO PO   Date Last Reviewed: 06/06/2015   Social History/Home Situation:   ?? Home Environment: Private residence  ?? Living Alone: No  ?? Support Systems: Family member(s), Friends \\ neighbors, Games developerpouse/Significant Other/Partner  Work/Activity History: Active  OBJECTIVE:  Outcome Measure:   Tool Used: Lower Extremity Functional Scale (LEFS)  Score:  Initial: 37/80 Most Recent: X/80 (Date: -- )   Interpretation of Score: 20 questions each scored on a 5 point scale with 0 representing "extreme difficulty or unable to perform" and 4 representing "no difficulty".  The lower the score, the greater the functional disability. 80/80 represents no disability.  Minimal detectable change is 9 points.  Score 80 79-63 62-48 47-32 31-16 15-1 0   Modifier CH CI CJ CK CL CM CN     Observation/Orthostatic Postural Assessment:  Posture  Assessment: Rounded shoulders, Forward head  Palpation: Mild swelling noted in right knee along surgical incision sites, but no bruising or redness or rash.  Mild swelling noted along left ankle/calf region, but no bruising or redness or rash noted.     ROM:     L LE Assessment (AROM):  ?? Hip Flexion: 80 degrees    ?? Knee Flexion: 110 degrees  ?? Knee Extension: 3 degrees            R LE Assessment (AROM):  ?? Hip Flexion: 80 degrees    ?? Knee Flexion: 112 degrees  ?? Knee Extension: 10 degrees        Strength:     L LE Assessment (Strength):  ?? Hip Flexion: 4/5 with manual muscle testing     ?? Knee Flexion: 3/5 with manual muscle testing  ?? Knee Extension: 3/5 with manual muscle testing      R LE Assessment (Strength):  ?? Hip Flexion: 4/5 with manual muscle testing    ?? Knee Flexion: 3/5 with manual muscle testing  ?? Knee Extension: 3/5 with manual muscle testing         Special Tests: Negative  Neurological Screen:    Myotomes:     L LE Assessment (Myotomes):  ?? Within normal limits    R LE Assessment (Myotomes):  ?? Within normal limits        Dermatomes:     L LE Assessment (Dermatomes):  ?? Within normal limits    R LE Assessment (Dermatomes):  ?? Within normal limits        Reflexes:     L LE Assessment (Reflexes):  ?? Within normal limits    R LE Assessment (Reflexes):  ?? Within normal limits         Neural Tension Tests: Within normal limits  Functional Mobility:   ?? Gait Description (WDL): Exceptions to WDL  ?? Base of Support: Widened  ?? Speed/Cadence: Pace decreased (<100 feet/min)  ?? Step Length: Left shortened, Right shortened  ?? Swing Pattern: Left asymmetrical, Right asymmetrical  ?? Gait Abnormalities: Antalgic    Balance:   ?? Sitting: Intact  ?? Standing: Intact  TREATMENT:    (In addition to Assessment/Re-Assessment sessions the following treatments were rendered)  THERAPEUTIC EXERCISE: (10 minutes):  Exercises per grid below to improve mobility, strength, balance and coordination.  Required minimal verbal and manual cues to promote proper body alignment, promote proper body posture and promote proper body mechanics.  Progressed resistance, range, repetitions and complexity of movement as indicated.   Date:  06/06/2015   Activity/Exercise Parameters   Straight leg raise 15 reps  B LE  HEP   Bridging 15 reps  HEP   LAQ with theraband Red t-band  15 reps  B LE   Manual Therapy (      0 minutes): N/A today  Therapeutic Modalities: Patient declined.                                                                                                HEP: As above; handouts given to patient  for all exercises.  ______________________________________________________________________________________________________    Treatment Assessment:  Patient tolerated assessment without complaints of increased pain.  Patient verbalized and demonstrated understanding of HEP.  Please explain any variance from above plan of care: Upcoming holidays/vacation  Progression/Medical Necessity:   ?? Patient is expected to demonstrate progress in strength, range of motion, balance and coordination to improve safety during daily activities.  ?? Patient demonstrates good rehab potential due to higher previous functional level.  ?? Skilled intervention continues to be required due to decreased mobility.  Compliance with Program/Exercises: Will assess as treatment progresses.   Reason for Continuation of Services/Other Comments:  ?? Patient continues to demonstrate capacity to improve overall mobility which will increase independence and increase safety.  Recommendations/Intent for next treatment session: "Treatment next visit will focus on advancements to more challenging activities".    Total Treatment Duration:  PT Patient Time In/Time Out  Time In: 0900  Time Out: 0945    Allene Dillon, PT

## 2015-06-09 ENCOUNTER — Inpatient Hospital Stay: Payer: PRIVATE HEALTH INSURANCE | Primary: Family Medicine

## 2015-06-13 ENCOUNTER — Inpatient Hospital Stay: Admit: 2015-06-13 | Payer: PRIVATE HEALTH INSURANCE | Primary: Family Medicine

## 2015-06-13 NOTE — Progress Notes (Signed)
[facility]rapy [facility] at [facility] [address], [address] [place], [place] [address]  Phone: [phone]   Fax: [phone]   Outpatient PHYSICAL THERAPY: Initial Assessment 06/13/2015  Fall Risk Score: 1 (? 5 = High Risk)    ICD-10: Treatment Diagnosis:   ?? Pain in left knee (M25.562)  ?? Stiffness of left knee, not elsewhere classified (Q65.784)   ?? Pain in right knee (M25.561)   ?? Stiffness of right knee, not elsewhere classified (M25.661)  REFERRING PHYSICIAN: Brock Ra, MD  MD Orders: Evaluate and Treat  Return Physician Appointment: TBD  MEDICAL/REFERRING DIAGNOSIS:  ?? Other tear of medial meniscus, current injury, right knee, initial encounter [S83.241A]  ?? Other tear of medial meniscus, current injury, left knee, initial encounter [S83.242A]  ?? Other tear of lateral meniscus, current injury, right knee, initial encounter [S83.281A]  ?? Other tear of lateral meniscus, current injury, left knee, initial encounter [S83.282A]  ?? Unilateral primary osteoarthritis, right knee [M17.11]  ?? 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 Nsaids (Non-Steroidal Anti-Inflammatory Drug) Other (comments)     Cause pedal edema if taken for a number of days in a row      ASSESSMENT:  ????????This section established at most recent assessment??????????  Patient presents with decreased mobility, decreased strength, and pain in bilateral knees secondary to s/p bilateral meniscal repair.  After discussing with patient, he agreed he would benefit from physical therapy to improve above deficits.  Please sign this plan of treatment if you concur.  Thank you for the opportunity to serve this patient.    PROBLEM LIST (Impairments causing functional limitations):  1. Decreased Strength affecting function  2. Decreased ADL/Functional Activities   3. Decreased Flexibility/joint mobility  4. Increased Pain affecting function  5. Decreased Activity tolerance   GOALS: (Goals have been discussed and agreed upon with patient.)  SHORT-TERM FUNCTIONAL GOALS: Time Frame: 4 weeks  1. Patient will be independent with home exercise program without exacerbation of symptoms or cueing needed.   DISCHARGE GOALS: Time Frame: 12 weeks  1. Patient will be independent with all ADLs with minimal onset of knee pain and no deficits with daily tasks.  2. Patient will report no fear avoidance with social or recreational activities due to bilateral knee pain.  3. Patient will score less than or equal to 60/80 on Lower Extremity Functional Scale with minimal effect of knee pain on patient's ability to manage every day life activities.   REHABILITATION POTENTIAL FOR STATED GOALS: GoodPLAN OF CARE:  INTERVENTIONS PLANNED: (Benefits and precautions of physical therapy have been discussed with the patient.)  1. balance exercise  2. cold  3. electrical stimulation  4. gait training  5. heat  6. home exercise program (HEP)  7. manual therapy  8. neuromuscular re-education/strengthening  9. range of motion: active/assisted/passive  10. therapeutic activities  11. therapeutic exercise/strengthening  12. ultrasound  TREATMENT PLAN EFFECTIVE DATES: 06/06/2015 TO 09/06/2015  FREQUENCY/DURATION: Follow patient 2 times a week for 12 weeks to address above goals.  Regarding Gevork Ayyad Tow's therapy, I certify that the treatment plan above will be carried out by a therapist or under their direction.  Thank you for this referral,  Coby Antrobus Idalia Needle, PTA     Referring Physician Signature: Brock Ra, MD  Date                       SUBJECTIVE:  History of Present Injury/Illness (Reason for Referral): Patient reports a chronic history of knee and back pain. He reports that he had bilateral meniscal surgery on 05/06/2015.  He reports that he had some swelling in  both legs that was painful.  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  He reports that his left knee is feeling good, just minimal soreness with activity. He reports that his right knee has been more painful and he reports that his right knee was more painful before surgery.  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  Present Symptoms: 06/13/2015: "I have some issues with my back that bother my Left knee that I am seeing specialist for, 4/10 pain in bilateral knee's"     Dominant Side: right  Past Medical History: Mr. 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 He also has no past medical history of Adverse effect of anesthesia, Difficult intubation, Malignant hyperthermia due to anesthesia, Nausea & vomiting, or Pseudocholinesterase deficiency.   He also  has a past surgical history that includes endoscopy, colon, diagnostic (05/16/10); knee arthroscopy (Bilateral, 05/06/15); and colonoscopy (05/31/15).   Current Medications:   Medication   ??? HYDROcodone-acetaminophen (NORCO) 7.5-325 mg per tablet   ??? topiramate (TOPAMAX) 50 mg tablet   ??? lamoTRIgine (LAMICTAL) 100 mg tablet   ??? diclofenac EC (VOLTAREN) 75 mg EC tablet   ??? omeprazole (PRILOSEC) 20 mg capsule   ??? metFORMIN (GLUCOPHAGE) 1,000 mg tablet   ??? VITAMIN D2 50,000 unit capsule    ??? Safety Needles 21 gauge x 1 1/2" ndle   ??? phentermine (ADIPEX-P) 37.5 mg tablet   ??? valsartan-hydrochlorothiazide (DIOVAN-HCT) 320-12.5 mg per tablet   ??? pravastatin (PRAVACHOL) 40 mg tablet   ??? metoprolol succinate (TOPROL-XL) 100 mg tablet   ??? testosterone cypionate (DEPOTESTOTERONE CYPIONATE) 200 mg/mL injection   ??? aspirin (ASPIRIN LOW-STRENGTH) 81 mg chewable tablet   ??? MULTI-VITAMIN HI-PO PO   Date Last Reviewed: 06/13/2015   Social History/Home Situation:   ?? Home Environment: Private residence  ?? Living Alone: No  ?? 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  The lower the score, the greater the functional disability. 80/80 represents no disability.  Minimal detectable change is 9 points.  Score 80 79-63 62-48 47-32 31-16 15-1 0   Modifier CH CI CJ CK CL CM CN     Observation/Orthostatic Postural Assessment:  Posture Assessment: Rounded shoulders, Forward head  Palpation: Mild swelling noted in right knee along surgical incision  sites, but no bruising or redness or rash.  Mild swelling noted along left ankle/calf region, but no bruising or redness or rash noted.     ROM:     L LE Assessment (AROM):  ?? Hip Flexion: 80 degrees    ?? Knee Flexion: 110 degrees  ?? Knee Extension: 3 degrees            R LE Assessment (AROM):  ?? Hip Flexion: 80 degrees    ?? Knee Flexion: 112 degrees  ?? Knee Extension: 10 degrees        Strength:     L LE Assessment (Strength):  ?? Hip Flexion: 4/5 with manual muscle testing    ?? Knee Flexion: 3/5 with manual muscle testing  ?? Knee Extension: 3/5 with manual muscle testing      R LE Assessment (Strength):   ?? Hip Flexion: 4/5 with manual muscle testing    ?? Knee Flexion: 3/5 with manual muscle testing  ?? Knee Extension: 3/5 with manual muscle testing         Special Tests: Negative  Neurological Screen:    Myotomes:     L LE Assessment (Myotomes):  ?? Within normal limits    R LE Assessment (Myotomes):  ?? Within normal limits        Dermatomes:     L LE Assessment (Dermatomes):  ?? Within normal limits    R LE Assessment (Dermatomes):  ?? Within normal limits        Reflexes:     L LE Assessment (Reflexes):  ?? Within normal limits    R LE Assessment (Reflexes):  ?? Within normal limits         Neural Tension Tests: Within normal limits  Functional Mobility:   ?? Gait Description (WDL): Exceptions to WDL  ?? Base of Support: Widened  ?? Speed/Cadence: Pace decreased (<100 feet/min)  ?? Step Length: Left shortened, Right shortened  ?? Swing Pattern: Left asymmetrical, Right asymmetrical  ?? Gait Abnormalities: Antalgic    Balance:   ?? Sitting: Intact  ?? Standing: Intact  TREATMENT:    (In addition to Assessment/Re-Assessment sessions the following treatments were rendered)  THERAPEUTIC EXERCISE: (30 minutes):  Exercises per grid below to improve mobility, strength, balance and coordination.  Required minimal verbal and manual cues to promote proper body alignment, promote proper body posture and promote proper body mechanics.  Progressed resistance, range, repetitions and complexity of movement as indicated.   Date:  06/13/2015   Activity/Exercise Parameters   Nu-Step X 8 minutes Level 4.0   Step Ups Leading Right then Left X 15 reps each   Quad Sets with towel under heel X 20 reps   Heel Slides X 20 reps   Hip Adduction #1 x 15 reps both knee's   Terminal Knee extension into Tband Blue x 20 reps Bilateral LE's       Straight leg raise 15 reps  B LE  HEP #1   Bridging 15 reps  HEP   LAQ  Red t-band  15 reps  B LE #1   Manual Therapy (      0 minutes): Knee flexion   Therapeutic Modalities:Ultra sound to Right Patella Tendon area secondary to increased discomfort and swelling x 8 minutes 1.0 50%  Icing at home.  HEP: As above; handouts given to patient for all exercises.  ______________________________________________________________________________________________________    Treatment Assessment:  Right Patella Tendon swelling and painful since the weekend.  Patient has been doing laps in the water swimming and stairs and wonders if that irritated the tendon.  Patient was able to complete tx with minimal discomfort.  Please explain any variance from above plan of care: Upcoming holidays/vacation  Progression/Medical Necessity:   ?? Patient is expected to demonstrate progress in strength, range of motion, balance and coordination to improve safety during daily activities.  ?? Patient demonstrates good rehab potential due to higher previous functional level.  ?? Skilled intervention continues to be required due to decreased mobility.  Compliance with Program/Exercises: Compliant   Reason for Continuation of Services/Other Comments:  ?? Patient continues to demonstrate capacity to improve overall mobility which will increase independence and increase safety.  Recommendations/Intent for next treatment session: "Treatment next visit will focus on advancements to more challenging activities".    Total Treatment Duration:  PT Patient Time In/Time Out  Time In: 1300  Time Out: 1345    Kabe Mckoy J Malyiah Fellows, PTA

## 2015-06-16 ENCOUNTER — Inpatient Hospital Stay: Admit: 2015-06-16 | Payer: PRIVATE HEALTH INSURANCE | Primary: Family Medicine

## 2015-06-16 NOTE — Progress Notes (Signed)
Therapy Center at [facility] [address]  [address], [address] [place], [place] [address]  Phone: [phone]   Fax: [phone]   Outpatient PHYSICAL THERAPY: Daily Note 06/16/2015  Fall Risk Score: 1 (? 5 = High Risk)    ICD-10: Treatment Diagnosis:   ?? Pain in left knee (M25.562)  ?? Stiffness of left knee, not elsewhere classified (Q65.784)   ?? Pain in right knee (M25.561)   ?? Stiffness of right knee, not elsewhere classified (M25.661)  REFERRING PHYSICIAN: Brock Ra, MD  MD Orders: Evaluate and Treat  Return Physician Appointment: TBD  MEDICAL/REFERRING DIAGNOSIS:  ?? Other tear of medial meniscus, current injury, right knee, initial encounter [S83.241A]  ?? Other tear of medial meniscus, current injury, left knee, initial encounter [S83.242A]  ?? Other tear of lateral meniscus, current injury, right knee, initial encounter [S83.281A]  ?? Other tear of lateral meniscus, current injury, left knee, initial encounter [S83.282A]  ?? Unilateral primary osteoarthritis, right knee [M17.11]  ?? 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 Nsaids (Non-Steroidal Anti-Inflammatory Drug) Other (comments)     Cause pedal edema if taken for a number of days in a row      ASSESSMENT:  ????????This section established at most recent assessment??????????  Patient presents with decreased mobility, decreased strength, and pain in bilateral knees secondary to s/p bilateral meniscal repair.  After discussing with patient, he agreed he would benefit from physical therapy to improve above deficits.  Please sign this plan of treatment if you concur.  Thank you for the opportunity to serve this patient.    PROBLEM LIST (Impairments causing functional limitations):  1. Decreased Strength affecting function  2. Decreased ADL/Functional Activities   3. Decreased Flexibility/joint mobility  4. Increased Pain affecting function  5. Decreased Activity tolerance   GOALS: (Goals have been discussed and agreed upon with patient.)  SHORT-TERM FUNCTIONAL GOALS: Time Frame: 4 weeks  1. Patient will be independent with home exercise program without exacerbation of symptoms or cueing needed.   DISCHARGE GOALS: Time Frame: 12 weeks  1. Patient will be independent with all ADLs with minimal onset of knee pain and no deficits with daily tasks.  2. Patient will report no fear avoidance with social or recreational activities due to bilateral knee pain.  3. Patient will score less than or equal to 60/80 on Lower Extremity Functional Scale with minimal effect of knee pain on patient's ability to manage every day life activities.   REHABILITATION POTENTIAL FOR STATED GOALS: GoodPLAN OF CARE:  INTERVENTIONS PLANNED: (Benefits and precautions of physical therapy have been discussed with the patient.)  1. balance exercise  2. cold  3. electrical stimulation  4. gait training  5. heat  6. home exercise program (HEP)  7. manual therapy  8. neuromuscular re-education/strengthening  9. range of motion: active/assisted/passive  10. therapeutic activities  11. therapeutic exercise/strengthening  12. ultrasound  TREATMENT PLAN EFFECTIVE DATES: 06/06/2015 TO 09/06/2015  FREQUENCY/DURATION: Follow patient 2 times a week for 12 weeks to address above goals.  Regarding Dmitri Pettigrew Broady's therapy, I certify that the treatment plan above will be carried out by a therapist or under their direction.  Thank you for this referral,  Tyliah Schlereth Idalia Needle, PTA     Referring Physician Signature: Brock Ra, MD  Date                       SUBJECTIVE:  History of Present Injury/Illness (Reason for Referral): Patient reports a chronic history of knee and back pain. He reports that he had bilateral meniscal surgery on 05/06/2015.  He reports that he had some swelling in  both legs that was painful.  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  He reports that his left knee is feeling good, just minimal soreness with activity. He reports that his right knee has been more painful and he reports that his right knee was more painful before surgery.  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  Present Symptoms: 06/16/2015: "I am doing ok"    Pain Intensity 1: 5 (My one ankle is bigger)  Dominant Side: right  Past Medical History: Mr. 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 He also has no past medical history of Adverse effect of anesthesia, Difficult intubation, Malignant hyperthermia due to anesthesia, Nausea & vomiting, or Pseudocholinesterase deficiency.   He also  has a past surgical history that includes endoscopy, colon, diagnostic (05/16/10); knee arthroscopy (Bilateral, 05/06/15); and colonoscopy (05/31/15).   Current Medications:   Medication   ??? HYDROcodone-acetaminophen (NORCO) 7.5-325 mg per tablet   ??? topiramate (TOPAMAX) 50 mg tablet   ??? lamoTRIgine (LAMICTAL) 100 mg tablet   ??? diclofenac EC (VOLTAREN) 75 mg EC tablet   ??? omeprazole (PRILOSEC) 20 mg capsule   ??? metFORMIN (GLUCOPHAGE) 1,000 mg tablet   ??? VITAMIN D2 50,000 unit capsule   ??? Safety Needles 21 gauge x 1 1/2" ndle    ??? phentermine (ADIPEX-P) 37.5 mg tablet   ??? valsartan-hydrochlorothiazide (DIOVAN-HCT) 320-12.5 mg per tablet   ??? pravastatin (PRAVACHOL) 40 mg tablet   ??? metoprolol succinate (TOPROL-XL) 100 mg tablet   ??? testosterone cypionate (DEPOTESTOTERONE CYPIONATE) 200 mg/mL injection   ??? aspirin (ASPIRIN LOW-STRENGTH) 81 mg chewable tablet   ??? MULTI-VITAMIN HI-PO PO   Date Last Reviewed: 06/16/2015   Social History/Home Situation:   ?? Home Environment: Private residence  ?? Living Alone: No  ?? 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  The lower the score, the greater the functional disability. 80/80 represents no disability.  Minimal detectable change is 9 points.  Score 80 79-63 62-48 47-32 31-16 15-1 0   Modifier CH CI CJ CK CL CM CN     Observation/Orthostatic Postural Assessment:  Posture Assessment: Rounded shoulders, Forward head  Palpation: Mild swelling noted in right knee along surgical incision sites, but no bruising or redness or rash.  Mild  swelling noted along left ankle/calf region, but no bruising or redness or rash noted.     ROM:     L LE Assessment (AROM):  ?? Hip Flexion: 80 degrees    ?? Knee Flexion: 110 degrees  ?? Knee Extension: 3 degrees            R LE Assessment (AROM):  ?? Hip Flexion: 80 degrees    ?? Knee Flexion: 112 degrees  ?? Knee Extension: 10 degrees        Strength:     L LE Assessment (Strength):  ?? Hip Flexion: 4/5 with manual muscle testing    ?? Knee Flexion: 3/5 with manual muscle testing  ?? Knee Extension: 3/5 with manual muscle testing      R LE Assessment (Strength):   ?? Hip Flexion: 4/5 with manual muscle testing    ?? Knee Flexion: 3/5 with manual muscle testing  ?? Knee Extension: 3/5 with manual muscle testing         Special Tests: Negative  Neurological Screen:    Myotomes:     L LE Assessment (Myotomes):  ?? Within normal limits    R LE Assessment (Myotomes):  ?? Within normal limits        Dermatomes:     L LE Assessment (Dermatomes):  ?? Within normal limits    R LE Assessment (Dermatomes):  ?? Within normal limits        Reflexes:     L LE Assessment (Reflexes):  ?? Within normal limits    R LE Assessment (Reflexes):  ?? Within normal limits         Neural Tension Tests: Within normal limits  Functional Mobility:   ?? Gait Description (WDL): Exceptions to WDL  ?? Base of Support: Widened  ?? Speed/Cadence: Pace decreased (<100 feet/min)  ?? Step Length: Left shortened, Right shortened  ?? Swing Pattern: Left asymmetrical, Right asymmetrical  ?? Gait Abnormalities: Antalgic    Balance:   ?? Sitting: Intact  ?? Standing: Intact  TREATMENT:    (In addition to Assessment/Re-Assessment sessions the following treatments were rendered)  THERAPEUTIC EXERCISE: (30 minutes):  Exercises per grid below to improve mobility, strength, balance and coordination.  Required minimal verbal and manual cues to promote proper body alignment, promote proper body posture and promote proper body mechanics.  Progressed resistance, range, repetitions and complexity of movement as indicated.   Date:  06/16/2015   Activity/Exercise Parameters   Nu-Step X 8 minutes Level 5.0   Step Ups Leading Right then Left X 15 reps each   Quad Sets with towel under heel X 20 reps   Heel Slides X 20 reps   Hip Adduction #1 x 15 reps both knee's   Terminal Knee extension into Tband Blue x 20 reps Bilateral LE's       Straight leg raise 15 reps  B LE  HEP #1   Bridging 15 reps  HEP   LAQ - SAQ   15 reps  B LE #3   Manual Therapy (      0 minutes): Knee flexion   Therapeutic Modalities:Ultra sound to Right Patella Tendon area secondary to increased discomfort and swelling x 8 minutes 1.0 50%  Icing at home.  HEP: As above; handouts given to patient for all exercises.  ______________________________________________________________________________________________________    Treatment Assessment:  Doing well overall.  Increase activity and continue PT.    Please explain any variance from above plan of care: Upcoming holidays/vacation  Progression/Medical Necessity:   ?? Patient is expected to demonstrate progress in strength, range of motion, balance and coordination to improve safety during daily activities.  ?? Patient demonstrates good rehab potential due to higher previous functional level.  ?? Skilled intervention continues to be required due to decreased mobility.  Compliance with Program/Exercises: Compliant   Reason for Continuation of Services/Other Comments:  ?? Patient continues to demonstrate capacity to improve overall mobility which will increase independence and increase safety.  Recommendations/Intent for next treatment session: "Treatment next visit will focus on advancements to more challenging activities".    Total Treatment Duration:  PT Patient Time In/Time Out  Time In: 0115  Time Out: 0200    Shakevia Sarris J Deitra Craine, PTA

## 2015-06-20 NOTE — Progress Notes (Signed)
Patient cx and rescheduled appt today.  Steven [name] J Kinser Fellman RPTA

## 2015-06-21 ENCOUNTER — Inpatient Hospital Stay: Payer: PRIVATE HEALTH INSURANCE | Primary: Family Medicine

## 2015-06-21 ENCOUNTER — Inpatient Hospital Stay: Admit: 2015-06-21 | Payer: PRIVATE HEALTH INSURANCE | Primary: Family Medicine

## 2015-06-21 NOTE — Progress Notes (Signed)
[facility] at [facility] [address] [place], [place] [address]  Phone: [phone]   Fax: [phone]   Outpatient PHYSICAL THERAPY: Daily Note 06/21/2015  Fall Risk Score: 1 (? 5 = High Risk)    ICD-10: Treatment Diagnosis:   ?? Pain in left knee (M25.562)  ?? Stiffness of left knee, not elsewhere classified (I69.629)   ?? Pain in right knee (M25.561)   ?? Stiffness of right knee, not elsewhere classified (M25.661)  REFERRING PHYSICIAN: Brock Ra, MD  MD Orders: Evaluate and Treat  Return Physician Appointment: TBD  MEDICAL/REFERRING DIAGNOSIS:  ?? Other tear of medial meniscus, current injury, right knee, initial encounter [S83.241A]  ?? Other tear of medial meniscus, current injury, left knee, initial encounter [S83.242A]  ?? Other tear of lateral meniscus, current injury, right knee, initial encounter [S83.281A]  ?? Other tear of lateral meniscus, current injury, left knee, initial encounter [S83.282A]  ?? Unilateral primary osteoarthritis, right knee [M17.11]  ?? 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 Nsaids (Non-Steroidal Anti-Inflammatory Drug) Other (comments)     Cause pedal edema if taken for a number of days in a row      ASSESSMENT:  ????????This section established at most recent assessment??????????  Patient presents with decreased mobility, decreased strength, and pain in bilateral knees secondary to s/p bilateral meniscal repair.  After discussing with patient, he agreed he would benefit from physical therapy to improve above deficits.  Please sign this plan of treatment if you concur.  Thank you for the opportunity to serve this patient.    PROBLEM LIST (Impairments causing functional limitations):  1. Decreased Strength affecting function  2. Decreased ADL/Functional Activities   3. Decreased Flexibility/joint mobility  4. Increased Pain affecting function  5. Decreased Activity tolerance   GOALS: (Goals have been discussed and agreed upon with patient.)  SHORT-TERM FUNCTIONAL GOALS: Time Frame: 4 weeks  1. Patient will be independent with home exercise program without exacerbation of symptoms or cueing needed.   DISCHARGE GOALS: Time Frame: 12 weeks  1. Patient will be independent with all ADLs with minimal onset of knee pain and no deficits with daily tasks.  2. Patient will report no fear avoidance with social or recreational activities due to bilateral knee pain.  3. Patient will score less than or equal to 60/80 on Lower Extremity Functional Scale with minimal effect of knee pain on patient's ability to manage every day life activities.   REHABILITATION POTENTIAL FOR STATED GOALS: GoodPLAN OF CARE:  INTERVENTIONS PLANNED: (Benefits and precautions of physical therapy have been discussed with the patient.)  1. balance exercise  2. cold  3. electrical stimulation  4. gait training  5. heat  6. home exercise program (HEP)  7. manual therapy  8. neuromuscular re-education/strengthening  9. range of motion: active/assisted/passive  10. therapeutic activities  11. therapeutic exercise/strengthening  12. ultrasound  TREATMENT PLAN EFFECTIVE DATES: 06/06/2015 TO 09/06/2015  FREQUENCY/DURATION: Follow patient 2 times a week for 12 weeks to address above goals.  Regarding Neeraj Housand Kiper's therapy, I certify that the treatment plan above will be carried out by a therapist or under their direction.  Thank you for this referral,  Genesi Stefanko Idalia Needle, PTA     Referring Physician Signature: Brock Ra, MD  Date                       SUBJECTIVE:  History of Present Injury/Illness (Reason for Referral): Patient reports a chronic history of knee and back pain. He reports that he had bilateral meniscal surgery on 05/06/2015.  He reports that he had some swelling in  both legs that was painful.  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  He reports that his left knee is feeling good, just minimal soreness with activity. He reports that his right knee has been more painful and he reports that his right knee was more painful before surgery.  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  Present Symptoms: 06/21/2015: "Swelling in this right knee area, not sure why"     Dominant Side: right  Past Medical History: Mr. 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 He also has no past medical history of Adverse effect of anesthesia, Difficult intubation, Malignant hyperthermia due to anesthesia, Nausea & vomiting, or Pseudocholinesterase deficiency.   He also  has a past surgical history that includes endoscopy, colon, diagnostic (05/16/10); knee arthroscopy (Bilateral, 05/06/15); and colonoscopy (05/31/15).   Current Medications:   Medication   ??? HYDROcodone-acetaminophen (NORCO) 7.5-325 mg per tablet   ??? topiramate (TOPAMAX) 50 mg tablet   ??? lamoTRIgine (LAMICTAL) 100 mg tablet   ??? diclofenac EC (VOLTAREN) 75 mg EC tablet   ??? omeprazole (PRILOSEC) 20 mg capsule   ??? metFORMIN (GLUCOPHAGE) 1,000 mg tablet   ??? VITAMIN D2 50,000 unit capsule   ??? Safety Needles 21 gauge x 1 1/2" ndle    ??? phentermine (ADIPEX-P) 37.5 mg tablet   ??? valsartan-hydrochlorothiazide (DIOVAN-HCT) 320-12.5 mg per tablet   ??? pravastatin (PRAVACHOL) 40 mg tablet   ??? metoprolol succinate (TOPROL-XL) 100 mg tablet   ??? testosterone cypionate (DEPOTESTOTERONE CYPIONATE) 200 mg/mL injection   ??? aspirin (ASPIRIN LOW-STRENGTH) 81 mg chewable tablet   ??? MULTI-VITAMIN HI-PO PO   Date Last Reviewed: 06/21/2015   Social History/Home Situation:   ?? Home Environment: Private residence  ?? Living Alone: No  ?? 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  The lower the score, the greater the functional disability. 80/80 represents no disability.  Minimal detectable change is 9 points.  Score 80 79-63 62-48 47-32 31-16 15-1 0   Modifier CH CI CJ CK CL CM CN     Observation/Orthostatic Postural Assessment:  Posture Assessment: Rounded shoulders, Forward head  Palpation: Mild swelling noted in right knee along surgical incision sites, but no bruising or redness or rash.  Mild swelling noted along left  ankle/calf region, but no bruising or redness or rash noted.     ROM:     L LE Assessment (AROM):  ?? Hip Flexion: 80 degrees    ?? Knee Flexion: 110 degrees  ?? Knee Extension: 3 degrees            R LE Assessment (AROM):  ?? Hip Flexion: 80 degrees    ?? Knee Flexion: 112 degrees  ?? Knee Extension: 10 degrees        Strength:     L LE Assessment (Strength):  ?? Hip Flexion: 4/5 with manual muscle testing    ?? Knee Flexion: 3/5 with manual muscle testing  ?? Knee Extension: 3/5 with manual muscle testing      R LE Assessment (Strength):   ?? Hip Flexion: 4/5 with manual muscle testing    ?? Knee Flexion: 3/5 with manual muscle testing  ?? Knee Extension: 3/5 with manual muscle testing         Special Tests: Negative  Neurological Screen:    Myotomes:     L LE Assessment (Myotomes):  ?? Within normal limits    R LE Assessment (Myotomes):  ?? Within normal limits        Dermatomes:     L LE Assessment (Dermatomes):  ?? Within normal limits    R LE Assessment (Dermatomes):  ?? Within normal limits        Reflexes:     L LE Assessment (Reflexes):  ?? Within normal limits    R LE Assessment (Reflexes):  ?? Within normal limits         Neural Tension Tests: Within normal limits  Functional Mobility:   ?? Gait Description (WDL): Exceptions to WDL  ?? Base of Support: Widened  ?? Speed/Cadence: Pace decreased (<100 feet/min)  ?? Step Length: Left shortened, Right shortened  ?? Swing Pattern: Left asymmetrical, Right asymmetrical  ?? Gait Abnormalities: Antalgic    Balance:   ?? Sitting: Intact  ?? Standing: Intact  TREATMENT:    (In addition to Assessment/Re-Assessment sessions the following treatments were rendered)  THERAPEUTIC EXERCISE: (35 minutes):  Exercises per grid below to improve mobility, strength, balance and coordination.  Required minimal verbal and manual cues to promote proper body alignment, promote proper body posture and promote proper body mechanics.  Progressed resistance, range, repetitions and complexity of movement as indicated.   Date:  06/21/2015   Activity/Exercise Parameters   Nu-Step X 8 minutes Level 5.0   Step Ups Leading Right then Left X 15 reps each   Quad Sets with towel under heel X 20 reps   Heel Slides X 20 reps   Hip Adduction #1 x 15 reps both knee's   Terminal Knee extension into Tband Blue x 20 reps Bilateral LE's   Nautilus Leg Press    Nautilus Leg Crul #60 x 20 B #35 single  #40 x 20 B     Straight leg raise 15 reps  B LE  HEP #1   Nautilus Leg Ext #40 x 20 reps    Bridging 15 reps  HEP   LAQ - SAQ   x    Manual Therapy (      0 minutes): Knee flexion  Therapeutic Modalities:Ultra sound to Right Patella Tendon area secondary to increased discomfort and swelling x 8 minutes 1.0 50%  Icing at home.  HEP: As above; handouts given to patient for all exercises.  ______________________________________________________________________________________________________    Treatment Assessment:  Doing well overall. Patellar tendon on Right LE giving patient some pain, mostly with steps, swelling noted as well.   He has MD follow up the beginning of September, he will speak to MD concerning Tendon pain.   Continue plan of care.    Please explain any variance from above plan of care: Upcoming holidays/vacation  Progression/Medical Necessity:   ?? Patient is expected to demonstrate progress in strength, range of motion, balance and coordination to improve safety during daily activities.  ?? Patient demonstrates good rehab potential due to higher previous functional level.  ?? Skilled intervention continues to be required due to decreased mobility.  Compliance with Program/Exercises: Compliant   Reason for Continuation of Services/Other Comments:  ?? Patient continues to demonstrate capacity to improve overall mobility which will increase independence and increase safety.  Recommendations/Intent for next treatment session: "Treatment next visit will focus on advancements to more challenging activities".    Total Treatment Duration:  43 minutes  PT Patient Time In/Time Out  Time In: 0945  Time Out: 1030    Rickayla Wieland J Bronsen Serano, PTA

## 2015-06-23 NOTE — Progress Notes (Incomplete)
Therapy Center at [facility] Building [address]  [address] [place], [place] [address]  Phone: [phone]   Fax: [phone]   Outpatient PHYSICAL THERAPY: Daily Note 06/23/2015  Fall Risk Score: 1 (? 5 = High Risk)    ICD-10: Treatment Diagnosis:   ?? Pain in left knee (M25.562)  ?? Stiffness of left knee, not elsewhere classified (Q65.784)   ?? Pain in right knee (M25.561)   ?? Stiffness of right knee, not elsewhere classified (M25.661)  REFERRING PHYSICIAN: Brock Ra, MD  MD Orders: Evaluate and Treat  Return Physician Appointment: TBD  MEDICAL/REFERRING DIAGNOSIS:  ?? Other tear of medial meniscus, current injury, right knee, initial encounter [S83.241A]  ?? Other tear of medial meniscus, current injury, left knee, initial encounter [S83.242A]  ?? Other tear of lateral meniscus, current injury, right knee, initial encounter [S83.281A]  ?? Other tear of lateral meniscus, current injury, left knee, initial encounter [S83.282A]  ?? Unilateral primary osteoarthritis, right knee [M17.11]  ?? 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 Nsaids (Non-Steroidal Anti-Inflammatory Drug) Other (comments)     Cause pedal edema if taken for a number of days in a row      ASSESSMENT:  ????????This section established at most recent assessment??????????  Patient presents with decreased mobility, decreased strength, and pain in bilateral knees secondary to s/p bilateral meniscal repair.  After discussing with patient, he agreed he would benefit from physical therapy to improve above deficits.  Please sign this plan of treatment if you concur.  Thank you for the opportunity to serve this patient.    PROBLEM LIST (Impairments causing functional limitations):  1. Decreased Strength affecting function  2. Decreased ADL/Functional Activities   3. Decreased Flexibility/joint mobility  4. Increased Pain affecting function  5. Decreased Activity tolerance   GOALS: (Goals have been discussed and agreed upon with patient.)  SHORT-TERM FUNCTIONAL GOALS: Time Frame: 4 weeks  1. Patient will be independent with home exercise program without exacerbation of symptoms or cueing needed.   DISCHARGE GOALS: Time Frame: 12 weeks  1. Patient will be independent with all ADLs with minimal onset of knee pain and no deficits with daily tasks.  2. Patient will report no fear avoidance with social or recreational activities due to bilateral knee pain.  3. Patient will score less than or equal to 60/80 on Lower Extremity Functional Scale with minimal effect of knee pain on patient's ability to manage every day life activities.   REHABILITATION POTENTIAL FOR STATED GOALS: GoodPLAN OF CARE:  INTERVENTIONS PLANNED: (Benefits and precautions of physical therapy have been discussed with the patient.)  1. balance exercise  2. cold  3. electrical stimulation  4. gait training  5. heat  6. home exercise program (HEP)  7. manual therapy  8. neuromuscular re-education/strengthening  9. range of motion: active/assisted/passive  10. therapeutic activities  11. therapeutic exercise/strengthening  12. ultrasound  TREATMENT PLAN EFFECTIVE DATES: 06/06/2015 TO 09/06/2015  FREQUENCY/DURATION: Follow patient 2 times a week for 12 weeks to address above goals.  Regarding Laurent Cargile Maisel's therapy, I certify that the treatment plan above will be carried out by a therapist or under their direction.  Thank you for this referral,  Trishna Cwik Idalia Needle, PTA     Referring Physician Signature: Brock Ra, MD  Date                       SUBJECTIVE:  History of Present Injury/Illness (Reason for Referral): Patient reports a chronic history of knee and back pain. He reports that he had bilateral meniscal surgery on 05/06/2015.  He reports that he had some swelling in  both legs that was painful.  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  He reports that his left knee is feeling good, just minimal soreness with activity. He reports that his right knee has been more painful and he reports that his right knee was more painful before surgery.  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  Present Symptoms: 06/23/2015: "Swelling in this right knee area, not sure why"     Dominant Side: right  Past Medical History: Mr. 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 He also has no past medical history of Adverse effect of anesthesia, Difficult intubation, Malignant hyperthermia due to anesthesia, Nausea & vomiting, or Pseudocholinesterase deficiency.   He also  has a past surgical history that includes endoscopy, colon, diagnostic (05/16/10); knee arthroscopy (Bilateral, 05/06/15); and colonoscopy (05/31/15).   Current Medications:   Medication   ??? HYDROcodone-acetaminophen (NORCO) 7.5-325 mg per tablet   ??? topiramate (TOPAMAX) 50 mg tablet   ??? lamoTRIgine (LAMICTAL) 100 mg tablet   ??? diclofenac EC (VOLTAREN) 75 mg EC tablet   ??? omeprazole (PRILOSEC) 20 mg capsule   ??? metFORMIN (GLUCOPHAGE) 1,000 mg tablet   ??? VITAMIN D2 50,000 unit capsule   ??? Safety Needles 21 gauge x 1 1/2" ndle    ??? phentermine (ADIPEX-P) 37.5 mg tablet   ??? valsartan-hydrochlorothiazide (DIOVAN-HCT) 320-12.5 mg per tablet   ??? pravastatin (PRAVACHOL) 40 mg tablet   ??? metoprolol succinate (TOPROL-XL) 100 mg tablet   ??? testosterone cypionate (DEPOTESTOTERONE CYPIONATE) 200 mg/mL injection   ??? aspirin (ASPIRIN LOW-STRENGTH) 81 mg chewable tablet   ??? MULTI-VITAMIN HI-PO PO   Date Last Reviewed: 06/23/2015   Social History/Home Situation:   ?? Home Environment: Private residence  ?? Living Alone: No  ?? 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  The lower the score, the greater the functional disability. 80/80 represents no disability.  Minimal detectable change is 9 points.  Score 80 79-63 62-48 47-32 31-16 15-1 0   Modifier CH CI CJ CK CL CM CN     Observation/Orthostatic Postural Assessment:  Posture Assessment: Rounded shoulders, Forward head  Palpation: Mild swelling noted in right knee along surgical incision sites, but no bruising or redness or rash.  Mild swelling noted along left  ankle/calf region, but no bruising or redness or rash noted.     ROM:     L LE Assessment (AROM):  ?? Hip Flexion: 80 degrees    ?? Knee Flexion: 110 degrees  ?? Knee Extension: 3 degrees            R LE Assessment (AROM):  ?? Hip Flexion: 80 degrees    ?? Knee Flexion: 112 degrees  ?? Knee Extension: 10 degrees        Strength:     L LE Assessment (Strength):  ?? Hip Flexion: 4/5 with manual muscle testing    ?? Knee Flexion: 3/5 with manual muscle testing  ?? Knee Extension: 3/5 with manual muscle testing      R LE Assessment (Strength):   ?? Hip Flexion: 4/5 with manual muscle testing    ?? Knee Flexion: 3/5 with manual muscle testing  ?? Knee Extension: 3/5 with manual muscle testing         Special Tests: Negative  Neurological Screen:    Myotomes:     L LE Assessment (Myotomes):  ?? Within normal limits    R LE Assessment (Myotomes):  ?? Within normal limits        Dermatomes:     L LE Assessment (Dermatomes):  ?? Within normal limits    R LE Assessment (Dermatomes):  ?? Within normal limits        Reflexes:     L LE Assessment (Reflexes):  ?? Within normal limits    R LE Assessment (Reflexes):  ?? Within normal limits         Neural Tension Tests: Within normal limits  Functional Mobility:   ?? Gait Description (WDL): Exceptions to WDL  ?? Base of Support: Widened  ?? Speed/Cadence: Pace decreased (<100 feet/min)  ?? Step Length: Left shortened, Right shortened  ?? Swing Pattern: Left asymmetrical, Right asymmetrical  ?? Gait Abnormalities: Antalgic    Balance:   ?? Sitting: Intact  ?? Standing: Intact  TREATMENT:    (In addition to Assessment/Re-Assessment sessions the following treatments were rendered)  THERAPEUTIC EXERCISE: (35 minutes):  Exercises per grid below to improve mobility, strength, balance and coordination.  Required minimal verbal and manual cues to promote proper body alignment, promote proper body posture and promote proper body mechanics.  Progressed resistance, range, repetitions and complexity of movement as indicated.   Date:  06/21/2015   Activity/Exercise Parameters   Nu-Step X 8 minutes Level 5.0   Step Ups Leading Right then Left X 15 reps each   Quad Sets with towel under heel X 20 reps   Heel Slides X 20 reps   Hip Adduction #1 x 15 reps both knee's   Terminal Knee extension into Tband Blue x 20 reps Bilateral LE's   Nautilus Leg Press    Nautilus Leg Crul #60 x 20 B #35 single  #40 x 20 B     Straight leg raise 15 reps  B LE  HEP #1   Nautilus Leg Ext #40 x 20 reps    Bridging 15 reps  HEP   LAQ - SAQ   x    Manual Therapy (      0 minutes): Knee flexion  Therapeutic Modalities:Ultra sound to Right Patella Tendon area secondary to increased discomfort and swelling x 8 minutes 1.0 50%  Icing at home.  HEP: As above; handouts given to patient for all exercises.  ______________________________________________________________________________________________________    Treatment Assessment:  Doing well overall. Patellar tendon on Right LE giving patient some pain, mostly with steps, swelling noted as well.   He has MD follow up the beginning of September, he will speak to MD concerning Tendon pain.   Continue plan of care.    Please explain any variance from above plan of care: Upcoming holidays/vacation  Progression/Medical Necessity:   ?? Patient is expected to demonstrate progress in strength, range of motion, balance and coordination to improve safety during daily activities.  ?? Patient demonstrates good rehab potential due to higher previous functional level.  ?? Skilled intervention continues to be required due to decreased mobility.  Compliance with Program/Exercises: Compliant   Reason for Continuation of Services/Other Comments:  ?? Patient continues to demonstrate capacity to improve overall mobility which will increase independence and increase safety.  Recommendations/Intent for next treatment session: "Treatment next visit will focus on advancements to more challenging activities".    Total Treatment Duration:  43 minutes  PT Patient Time In/Time Out  Time In: 0145  Time Out: 0230    Antionette Luster J Chelsia Serres, PTA

## 2015-06-24 ENCOUNTER — Inpatient Hospital Stay: Payer: PRIVATE HEALTH INSURANCE | Primary: Family Medicine

## 2015-06-27 ENCOUNTER — Inpatient Hospital Stay: Admit: 2015-06-27 | Payer: PRIVATE HEALTH INSURANCE | Primary: Family Medicine

## 2015-06-27 DIAGNOSIS — G4730 Sleep apnea, unspecified: Secondary | ICD-10-CM

## 2015-06-27 NOTE — Progress Notes (Signed)
Therapy Center at [facility] [address]  [address] [place], [place] [address]  Phone: [phone]   Fax: [phone]   Outpatient PHYSICAL THERAPY: Daily Note 06/27/2015  Fall Risk Score: 1 (? 5 = High Risk)    ICD-10: Treatment Diagnosis:   ?? Pain in left knee (M25.562)  ?? Stiffness of left knee, not elsewhere classified (U72.536)   ?? Pain in right knee (M25.561)   ?? Stiffness of right knee, not elsewhere classified (M25.661)  REFERRING PHYSICIAN: Brock Ra, MD  MD Orders: Evaluate and Treat  Return Physician Appointment: TBD  MEDICAL/REFERRING DIAGNOSIS:  ?? Other tear of medial meniscus, current injury, right knee, initial encounter [S83.241A]  ?? Other tear of medial meniscus, current injury, left knee, initial encounter [S83.242A]  ?? Other tear of lateral meniscus, current injury, right knee, initial encounter [S83.281A]  ?? Other tear of lateral meniscus, current injury, left knee, initial encounter [S83.282A]  ?? Unilateral primary osteoarthritis, right knee [M17.11]  ?? 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 Nsaids (Non-Steroidal Anti-Inflammatory Drug) Other (comments)     Cause pedal edema if taken for a number of days in a row      ASSESSMENT:  ????????This section established at most recent assessment??????????  Patient presents with decreased mobility, decreased strength, and pain in bilateral knees secondary to s/p bilateral meniscal repair.  After discussing with patient, he agreed he would benefit from physical therapy to improve above deficits.  Please sign this plan of treatment if you concur.  Thank you for the opportunity to serve this patient.    PROBLEM LIST (Impairments causing functional limitations):  1. Decreased Strength affecting function  2. Decreased ADL/Functional Activities   3. Decreased Flexibility/joint mobility  4. Increased Pain affecting function  5. Decreased Activity tolerance   GOALS: (Goals have been discussed and agreed upon with patient.)  SHORT-TERM FUNCTIONAL GOALS: Time Frame: 4 weeks  1. Patient will be independent with home exercise program without exacerbation of symptoms or cueing needed.   DISCHARGE GOALS: Time Frame: 12 weeks  1. Patient will be independent with all ADLs with minimal onset of knee pain and no deficits with daily tasks.  2. Patient will report no fear avoidance with social or recreational activities due to bilateral knee pain.  3. Patient will score less than or equal to 60/80 on Lower Extremity Functional Scale with minimal effect of knee pain on patient's ability to manage every day life activities.   REHABILITATION POTENTIAL FOR STATED GOALS: GoodPLAN OF CARE:  INTERVENTIONS PLANNED: (Benefits and precautions of physical therapy have been discussed with the patient.)  1. balance exercise  2. cold  3. electrical stimulation  4. gait training  5. heat  6. home exercise program (HEP)  7. manual therapy  8. neuromuscular re-education/strengthening  9. range of motion: active/assisted/passive  10. therapeutic activities  11. therapeutic exercise/strengthening  12. ultrasound  TREATMENT PLAN EFFECTIVE DATES: 06/06/2015 TO 09/06/2015  FREQUENCY/DURATION: Follow patient 2 times a week for 12 weeks to address above goals.  Regarding Devaun Hernandez Durnin's therapy, I certify that the treatment plan above will be carried out by a therapist or under their direction.  Thank you for this referral,  Tessah Patchen Idalia Needle, PTA     Referring Physician Signature: Brock Ra, MD  Date                       SUBJECTIVE:  History of Present Injury/Illness (Reason for Referral): Patient reports a chronic history of knee and back pain. He reports that he had bilateral meniscal surgery on 05/06/2015.  He reports that he had some swelling in  both legs that was painful.  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  He reports that his left knee is feeling good, just minimal soreness with activity. He reports that his right knee has been more painful and he reports that his right knee was more painful before surgery.  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  Present Symptoms: 06/27/2015: "Swelling in this right knee area, not sure why"  Pain Intensity 1:  (slightly better)  Dominant Side: right  Past Medical History: Mr. 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 He also has no past medical history of Adverse effect of anesthesia, Difficult intubation, Malignant hyperthermia due to anesthesia, Nausea & vomiting, or Pseudocholinesterase deficiency.   He also  has a past surgical history that includes endoscopy, colon, diagnostic (05/16/10); knee arthroscopy (Bilateral, 05/06/15); and colonoscopy (05/31/15).   Current Medications:   Medication   ??? HYDROcodone-acetaminophen (NORCO) 7.5-325 mg per tablet   ??? topiramate (TOPAMAX) 50 mg tablet   ??? lamoTRIgine (LAMICTAL) 100 mg tablet   ??? diclofenac EC (VOLTAREN) 75 mg EC tablet   ??? omeprazole (PRILOSEC) 20 mg capsule   ??? metFORMIN (GLUCOPHAGE) 1,000 mg tablet   ??? VITAMIN D2 50,000 unit capsule   ??? Safety Needles 21 gauge x 1 1/2" ndle    ??? phentermine (ADIPEX-P) 37.5 mg tablet   ??? valsartan-hydrochlorothiazide (DIOVAN-HCT) 320-12.5 mg per tablet   ??? pravastatin (PRAVACHOL) 40 mg tablet   ??? metoprolol succinate (TOPROL-XL) 100 mg tablet   ??? testosterone cypionate (DEPOTESTOTERONE CYPIONATE) 200 mg/mL injection   ??? aspirin (ASPIRIN LOW-STRENGTH) 81 mg chewable tablet   ??? MULTI-VITAMIN HI-PO PO   Date Last Reviewed: 06/27/2015   Social History/Home Situation:   ?? Home Environment: Private residence  ?? Living Alone: No  ?? 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  The lower the score, the greater the functional disability. 80/80 represents no disability.  Minimal detectable change is 9 points.  Score 80 79-63 62-48 47-32 31-16 15-1 0   Modifier CH CI CJ CK CL CM CN     Observation/Orthostatic Postural Assessment:  Posture Assessment: Rounded shoulders, Forward head  Palpation: Mild swelling noted in right knee along surgical incision sites, but no bruising or redness or rash.  Mild  swelling noted along left ankle/calf region, but no bruising or redness or rash noted.     ROM:     L LE Assessment (AROM):  ?? Hip Flexion: 80 degrees    ?? Knee Flexion: 110 degrees  ?? Knee Extension: 3 degrees            R LE Assessment (AROM):  ?? Hip Flexion: 80 degrees    ?? Knee Flexion: 112 degrees  ?? Knee Extension: 10 degrees        Strength:     L LE Assessment (Strength):  ?? Hip Flexion: 4/5 with manual muscle testing    ?? Knee Flexion: 3/5 with manual muscle testing  ?? Knee Extension: 3/5 with manual muscle testing      R LE Assessment (Strength):   ?? Hip Flexion: 4/5 with manual muscle testing    ?? Knee Flexion: 3/5 with manual muscle testing  ?? Knee Extension: 3/5 with manual muscle testing         Special Tests: Negative  Neurological Screen:    Myotomes:     L LE Assessment (Myotomes):  ?? Within normal limits    R LE Assessment (Myotomes):  ?? Within normal limits        Dermatomes:     L LE Assessment (Dermatomes):  ?? Within normal limits    R LE Assessment (Dermatomes):  ?? Within normal limits        Reflexes:     L LE Assessment (Reflexes):  ?? Within normal limits    R LE Assessment (Reflexes):  ?? Within normal limits         Neural Tension Tests: Within normal limits  Functional Mobility:   ?? Gait Description (WDL): Exceptions to WDL  ?? Base of Support: Widened  ?? Speed/Cadence: Pace decreased (<100 feet/min)  ?? Step Length: Left shortened, Right shortened  ?? Swing Pattern: Left asymmetrical, Right asymmetrical  ?? Gait Abnormalities: Antalgic    Balance:   ?? Sitting: Intact  ?? Standing: Intact  TREATMENT:    (In addition to Assessment/Re-Assessment sessions the following treatments were rendered)  THERAPEUTIC EXERCISE: (35 minutes):  Exercises per grid below to improve mobility, strength, balance and coordination.  Required minimal verbal and manual cues to promote proper body alignment, promote proper body posture and promote proper body mechanics.  Progressed resistance, range, repetitions and complexity of movement as indicated.   Date:  06/27/2015   Activity/Exercise Parameters   Nu-Step X 10 minutes Level 5.0   Step Ups Leading Right then Left X 15 reps each   Quad Sets with towel under heel X 20 reps   Heel Slides X 20 reps   Hip Adduction #1 x 15 reps both knee's   Terminal Knee extension into Tband Blue x 20 reps Bilateral LE's   Nautilus Leg Press    Nautilus Leg Crul #60 x 20 B #    #40 x 20 B     Straight leg raise 15 reps  B LE  HEP #1   Nautilus Leg Ext #40 x 20 reps    Bridging 15 reps  HEP   LAQ - SAQ   x    Manual Therapy (      0 minutes):     Therapeutic Modalities:Ultra sound to Right Patella Tendon area secondary to increased discomfort and swelling x 8 minutes 1.0 50%  Icing at home.  HEP: As above; handouts given to patient for all exercises.  ______________________________________________________________________________________________________    Treatment Assessment:  Patients knee feels better today, decrease swelling in Left LE as well.  Patient has MD appt follow up in September.   Good progress.    Please explain any variance from above plan of care: Upcoming holidays/vacation  Progression/Medical Necessity:   ?? Patient is expected to demonstrate progress in strength, range of motion, balance and coordination to improve safety during daily activities.  ?? Patient demonstrates good rehab potential due to higher previous functional level.  ?? Skilled intervention continues to be required due to decreased mobility.  Compliance with Program/Exercises: Compliant   Reason for Continuation of Services/Other Comments:  ?? Patient continues to demonstrate capacity to improve overall mobility which will increase independence and increase safety.  Recommendations/Intent for next treatment session: "Treatment next visit will focus on advancements to more challenging activities".    Total Treatment Duration:  43 minutes  PT Patient Time In/Time Out  Time In: 1345  Time Out: 1430    Kennette Cuthrell J Kion Huntsberry, PTA

## 2015-06-28 ENCOUNTER — Inpatient Hospital Stay: Admit: 2015-06-30 | Payer: PRIVATE HEALTH INSURANCE | Primary: Family Medicine

## 2015-06-30 ENCOUNTER — Ambulatory Visit: Admit: 2015-06-30 | Discharge: 2015-06-30 | Payer: PRIVATE HEALTH INSURANCE | Primary: Family Medicine

## 2015-06-30 DIAGNOSIS — G4733 Obstructive sleep apnea (adult) (pediatric): Secondary | ICD-10-CM

## 2015-06-30 DIAGNOSIS — M25562 Pain in left knee: Secondary | ICD-10-CM

## 2015-06-30 NOTE — Progress Notes (Signed)
[facility] [place] [facility]  [address]  [place], [place] [address]  [phone]    Patient Name:  Steven [name]  Date of Birth:  1956/02/19      Office Visit 07/23/15    CHIEF COMPLAINT:    Chief Complaint   Patient presents with   ??? Sleep Apnea       HISTORY OF PRESENT ILLNESS:      This is a 59 year old white male seen in outpatient consultation at the request of Dr. Alfredo Bach for evaluation of sleep apnea.    The patient has a long-standing history of being overweight and snoring.  He has more recently developed issues with daytime sleepiness and fatigue associated with some nocturnal gasping.  He has had some difficulty maintaining sleep and has noticed some memory impairment.  He has gained a moderate amount of weight but has recently been able to lose about 15 pounds after he was told by a relative who is a physician to avoid soft drinks and sweet tea. The patient did recently have surgery and was noted to be cyanotic on a couple of occasions by the anesthesiologist who strongly recommended he have a sleep study.    He recently underwent a polysomnography which revealed evidence of moderate obstructive sleep apnea with an AHI of 27.6 all of which were due to hypotony is.  With supine sleep the AHI rose to 49.4.  Desaturations only to 88% occurred.  His arousal index was only 4.2.  A CPAP titration was subsequently performed with the optimal pressure being 10 cm of water.  At that level he had no disordered breathing and his lowest oxygen saturation was 94%.  He was noted to have some periodic limb movements but not associated with any significant arousals.  He did feel better the day following the study.        Past Medical History   Diagnosis Date   ??? Allergic rhinitis, cause unspecified    ??? Borderline diabetes      03/2015 A1c 6.1  metformin daily- does not take BS at home- last A1C   ??? 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 Chronic radicular low back pain       He has had low back pain that radiates down his LLE for years.   ??? Diverticulitis    ??? Generalized anxiety disorder    ??? GERD (gastroesophageal reflux disease)      periodic/otc meds prn - sleeps elevated 3 pillows   ??? Hypercholesterolemia    ??? Hypertension      managed with med   ??? Left lumbar radiculitis      He has had low back pain that radiates down his LLE for years.   ??? Mood disorder (HCC) 07/14/2014   ??? Obesity, unspecified 05/05/15     BMI 37   ??? Other malaise and fatigue    ??? Other testicular hypofunction    ??? Pre-diabetes    ??? Venous insufficiency of both lower extremities 07/14/2014   ??? Vitamin D deficiency      09/14/13 Vit D 19.1-start ergocalciferol         Problem List  Date Reviewed: 23-Jul-2015          Codes Class Noted    OSA (obstructive sleep apnea) ICD-10-CM: G47.33  ICD-9-CM: 327.23  23-Jul-2015        Chronic radicular low back pain ICD-10-CM: M54.16, G89.29  ICD-9-CM: 724.4, 338.29  09/11/2014  Chronic left sacroiliac joint pain ICD-10-CM: M53.3, G89.29  ICD-9-CM: 724.6, 338.29  08/23/2014        Mood disorder (HCC) ICD-10-CM: F39  ICD-9-CM: 296.90  07/14/2014        Venous insufficiency of both lower extremities ICD-10-CM: I87.2  ICD-9-CM: 459.81  07/14/2014        Vitamin D deficiency ICD-10-CM: E55.9  ICD-9-CM: 268.9  Unknown    Overview Signed 09/22/2013  7:52 AM by Delman Kitten, NP     09/14/13 Vit D 19.1-start ergocalciferol             Left lumbar radiculitis ICD-10-CM: M54.16  ICD-9-CM: 724.4  Unknown    Overview Signed 09/15/2013  8:10 PM by Delman Kitten, NP     He has had low back pain that radiates down his LLE for years.             Pure hypercholesterolemia ICD-10-CM: E78.0  ICD-9-CM: 272.0  07/07/2012        BMI 40.0-44.9, adult (HCC) ICD-10-CM: Z68.41  ICD-9-CM: V85.41  Unknown        Generalized anxiety disorder ICD-10-CM: F41.1  ICD-9-CM: 300.02  Unknown        Other malaise and fatigue ICD-10-CM: R53.81, R53.83  ICD-9-CM: 780.79  Unknown         Testicular hypofunction ICD-10-CM: E29.1  ICD-9-CM: 257.2  Unknown        Allergic rhinitis, cause unspecified ICD-10-CM: J30.9  ICD-9-CM: 477.9  Unknown        Pre-diabetes ICD-10-CM: R73.09  ICD-9-CM: 790.29  Unknown    Overview Signed 06/20/2015  2:13 AM by Delman Kitten, NP     06/16/15 Annual Dilated Eye Exam shows no diabetic retinopathy             Essential hypertension, benign ICD-10-CM: I10  ICD-9-CM: 401.1  Unknown                Past Surgical History   Procedure Laterality Date   ??? Endoscopy, colon, diagnostic  05/16/10     Diverticulosis   ??? Hx knee arthroscopy Bilateral 05/06/15     Partial medial and lateral meniscectomies by Dr. Benjamine Mola   ??? Hx colonoscopy  05/31/15     Diverticulosis       No flowsheet data found.      Social History     Social History   ??? Marital status: MARRIED     Spouse name: N/A   ??? Number of children: N/A   ??? Years of education: N/A     Occupational History   ??? Not on file.     Social History Main Topics   ??? Smoking status: Never Smoker   ??? Smokeless tobacco: Never Used   ??? Alcohol use 0.5 oz/week     1 Cans of beer per week   ??? Drug use: No   ??? Sexual activity: Not on file     Other Topics Concern   ??? Not on file     Social History Narrative         Family History   Problem Relation Age of Onset   ??? Diabetes Mother    ??? Alzheimer Mother    ??? Heart Disease Father      atherosclerosis (heavy smoker)   ??? Coronary Artery Disease Neg Hx    ??? Colon Cancer Neg Hx    ??? Attention Deficit Hyperactivity Disorder Daughter          Allergies  Allergen Reactions   ??? Nsaids (Non-Steroidal Anti-Inflammatory Drug) Other (comments)     Cause pedal edema if taken for a number of days in a row         Current Outpatient Prescriptions   Medication Sig   ??? diclofenac EC (VOLTAREN) 75 mg EC tablet Take 1 Tab by mouth two (2) times daily (with meals).   ??? omeprazole (PRILOSEC) 20 mg capsule Take 20 mg by mouth daily as needed.   ??? metFORMIN (GLUCOPHAGE) 1,000 mg tablet TAKE ONE TABLET BY MOUTH TWICE A  DAY   ??? VITAMIN D2 50,000 unit capsule TAKE 1 CAP BY MOUTH EVERY SEVEN (7) DAYS. INDICATIONS: VITAMIN D DEFICIENCY   ??? Safety Needles 21 gauge x 1 1/2" ndle 1 Units by Does Not Apply route every seven (7) days.   ??? phentermine (ADIPEX-P) 37.5 mg tablet TAKE 1 TABLET BY MOUTH IN THE MORNING   ??? valsartan-hydrochlorothiazide (DIOVAN-HCT) 320-12.5 mg per tablet TAKE 1 TABLET BY MOUTH EVERY DAY   ??? pravastatin (PRAVACHOL) 40 mg tablet TAKE 1 TABLET BY MOUTH AT BEDTIME   ??? metoprolol succinate (TOPROL-XL) 100 mg tablet Take 1 Tab by mouth nightly. Indications: HYPERTENSION   ??? testosterone cypionate (DEPOTESTOTERONE CYPIONATE) 200 mg/mL injection INJECT 1/2 MILLILITER EVERY 7 DAYS   ??? aspirin (ASPIRIN LOW-STRENGTH) 81 mg chewable tablet Take 81 mg by mouth every morning.   ??? MULTI-VITAMIN HI-PO PO Take 1 Tab by mouth daily.     No current facility-administered medications for this visit.            REVIEW OF SYSTEMS:   CONSTITUTIONAL:   There is no history of fever, chills, night sweats, weight loss, weight gain; he has had persistent fatigue with lethargy/hypersomnolence.   EYES:   Denies problems with eye pain, erythema, blurred vision, or visual field loss.   ENTM:   Denies history of tinnitus, epistaxis, sore throat, hoarseness, or dysphonia.   LYMPH:   Denies swollen glands.   CARDIAC:   No chest pain, pressure, discomfort, palpitations, orthopnea, murmurs, or edema.   GI:   No dysphagia, heartburn reflux, nausea/vomiting, diarrhea, abdominal pain, or bleeding.   GU:   Denies history of dysuria, hematuria, polyuria, or decreased urine output.   MS:   No history of myalgias, arthralgias, bone pain, or muscle cramps.   SKIN:   No history of rashes, jaundice, cyanosis, nodules, or ulcers.   ENDO:   Negative for heat or cold intolerance.  He has type 2 diabetes mellitus.   PSYCH:   Negative for anxiety, depression, insomnia, hallucinations.   NEURO:   There is no history of AMS, persistent headache, decreased level of  consciousness, seizures, or motor or sensory deficits.      PHYSICAL EXAM:    Visit Vitals   ??? BP 128/85 (BP 1 Location: Right arm, BP Patient Position: Sitting)   ??? Pulse 73   ??? Resp 22   ??? Ht 5\' 10"  (1.778 m)   ??? Wt 277 lb (125.6 kg)   ??? SpO2 95%   ??? BMI 39.75 kg/m2        GENERAL APPEARANCE:   The patient is over weight and in no respiratory distress.   HEENT:   PERRL.  Conjunctivae unremarkable.   Nasal mucosa is without epistaxis, exudate, or polyps.  Gums and dentition are unremarkable.  There is moderate oropharyngeal narrowing.     NECK/LYMPHATIC:   Symmetrical with no elevation of jugular venous pulsation.  Trachea midline. No  thyroid enlargement.  No cervical adenopathy.   LUNGS:   Normal respiratory effort with symmetrical lung expansion.   Breath sounds are clear bilaterally.   HEART:   There is a regular rate and rhythm.  No murmur, rub, or gallop.  There is no edema in the lower extremities.   ABDOMEN:   Soft and non-tender. Bowel sounds are normal.     SKIN:   There are no rashes, cyanosis, jaundice, or ecchymosis present.   EXTREMITIES:   The extremities are unremarkable without clubbing, cyanosis, joint inflammation, degenerative, or ischemic change.   MUSCULOSKELETAL:   There is no abnormal tone, muscle atrophy, or abnormal movement present.   NEURO:   The patient is alert and oriented to person, place, and time.  Memory appears intact and mood is normal.  No gross sensorimotor deficits are present.        ASSESSMENT:  (Medical Decision Making)         ICD-10-CM ICD-9-CM    1. OSA (obstructive sleep apnea) G47.33 327.23 The patient has moderate obstructive sleep apnea associated with minimal hypoxemia.  Application of CPAP at 10 cm of water eliminated disordered breathing and maintained adequate oxygen saturations.    The pathophysiology of obstructive sleep apnea was reviewed with the patient.  It's potential to promote severe neurologic, cardiac, pulmonary,  and gastrointestinal problems was discussed.  Specifically, the increased incidence of hypertension, coronary artery disease, congestive heart failure, pulmonary hypertension, gastroesophageal reflux, pathologic hypersomnolence, memory loss, and glucose intolerance was related to the consequences of hypoxemia, hypercapnia, airway obstruction, and sympathetic overdrive.  We also discussed the ability of nasal CPAP to correct these abnormalities through maintenance of a patent airway.       2. BMI 40.0-44.9, adult (HCC) Z68.41 V85.41 The patient has recently lost a bit of weight decreasing his BMI to just under 40.  He needs to lose considerable weight and I recommended the [employer] along with exercise as able in order to facilitate weight loss.  He is able to swim despite his orthopedic issues and this was encouraged.              PLAN:    The patient will be prescribed CPAP at 10 cm of water with a 20 minute ramp and humidity.  He did use a fullface mask during the study but is interested in considering other options.    Weight loss through dieting and exercise as above.  He is provided the [employer].     Follow-up will be in 3 months or sooner if needed.       Orders Placed This Encounter   ??? AMB SUPPLY ORDER        Debbra Riding, MD  Electronically signed    Over 50% of today's office visit was spent in face to face time reviewing test results, prognosis, importance of compliance, education about disease process, benefits of medications, instructions for management of acute flare-ups, and follow up plans.  Total face to face time spent with patient was 30 minutes.      Dictated using voice recognition software.  Proof read but unrecognized errors may exist.

## 2015-06-30 NOTE — Progress Notes (Signed)
[facility] at [facility] [address]  [address] [place], [place] [address]  Phone: [phone]   Fax: [phone]    OUTPATIENT DAILY NOTE    NAME/AGE/GENDER: Steven [name] is a 59 y.o. male.     DATE: 06/30/2015    Patient cancelled his appointment for today due to conflicting appoinments.  Will plan to follow up on next scheduled visit.    Allene Dillon, PT

## 2015-07-01 ENCOUNTER — Inpatient Hospital Stay: Payer: PRIVATE HEALTH INSURANCE | Primary: Family Medicine

## 2015-07-05 ENCOUNTER — Inpatient Hospital Stay: Admit: 2015-07-05 | Payer: PRIVATE HEALTH INSURANCE | Primary: Family Medicine

## 2015-07-05 MED ORDER — TESTOSTERONE CYPIONATE 200 MG/ML IM OIL
200 mg/mL | INTRAMUSCULAR | 2 refills | Status: DC
Start: 2015-07-05 — End: 2015-09-29

## 2015-07-05 NOTE — Progress Notes (Signed)
[facility], [address] [place] [address]  Phone: [phone]   Fax: [phone]   Outpatient PHYSICAL THERAPY: Progress Report 07/05/2015  Fall Risk Score: 1 (? 5 = High Risk)    ICD-10: Treatment Diagnosis:   ?? Pain in left knee (M25.562)  ?? Stiffness of left knee, not elsewhere classified (G64.403)   ?? Pain in right knee (M25.561)   ?? Stiffness of right knee, not elsewhere classified (M25.661)  REFERRING PHYSICIAN: Rella Larve, MD  MD Orders: Evaluate and Treat  Return Physician Appointment: TBD  MEDICAL/REFERRING DIAGNOSIS:  ?? Other tear of medial meniscus, current injury, right knee, initial encounter [S83.241A]  ?? Other tear of medial meniscus, current injury, left knee, initial encounter [S83.242A]  ?? Other tear of lateral meniscus, current injury, right knee, initial encounter [S83.281A]  ?? Other tear of lateral meniscus, current injury, left knee, initial encounter [S83.282A]  ?? Unilateral primary osteoarthritis, right knee [M17.11]  ?? 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 Nsaids (Non-Steroidal Anti-Inflammatory Drug) Other (comments)     Cause pedal edema if taken for a number of days in a row      ASSESSMENT:  ????????This section established at most recent assessment??????????  Patient presents with decreased mobility, decreased strength, and pain in bilateral knees secondary to s/p bilateral meniscal repair.  Patient has attended a total of 11 scheduled physical therapy visits including initial evaluation on 06/06/2015.  Treatment has consisted of range of motion, strengthening, stretching, and modalities to improve overall mobility and performance with activities of daily living.     PROBLEM LIST (Impairments causing functional limitations):   1. Decreased Strength affecting function  2. Decreased ADL/Functional Activities  3. Decreased Flexibility/joint mobility  4. Increased Pain affecting function  5. Decreased Activity tolerance   GOALS: (Goals have been discussed and agreed upon with patient.)  SHORT-TERM FUNCTIONAL GOALS: Time Frame: 4 weeks  1. Patient will be independent with home exercise program without exacerbation of symptoms or cueing needed--goal met.   DISCHARGE GOALS: Time Frame: 12 weeks  1. Patient will be independent with all ADLs with minimal onset of knee pain and no deficits with daily tasks--goal ongoing.  2. Patient will report no fear avoidance with social or recreational activities due to bilateral knee pain--goal ongoing.  3. Patient will score less than or equal to 60/80 on Lower Extremity Functional Scale with minimal effect of knee pain on patient's ability to manage every day life activities--goal ongoing.   REHABILITATION POTENTIAL FOR STATED GOALS: GoodPLAN OF CARE:  INTERVENTIONS PLANNED: (Benefits and precautions of physical therapy have been discussed with the patient.)  1. balance exercise  2. cold  3. electrical stimulation  4. gait training  5. heat  6. home exercise program (HEP)  7. manual therapy  8. neuromuscular re-education/strengthening  9. range of motion: active/assisted/passive  10. therapeutic activities  11. therapeutic exercise/strengthening  12. ultrasound  TREATMENT PLAN EFFECTIVE DATES: 06/06/2015 TO 09/06/2015  FREQUENCY/DURATION: Follow patient 2 times a week for 12 weeks to address above goals.                SUBJECTIVE:  History of Present Injury/Illness (Reason for Referral): Patient reports a chronic history of knee and back pain. He reports that he had bilateral meniscal surgery on  05/06/2015.  He reports that he had some swelling in both legs that was painful.  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  He reports that  his left knee is feeling good, just minimal soreness with activity. He reports that his right knee has been more painful and he reports that his right knee was more painful before surgery.  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  Present Symptoms: 07/05/2015: Patient reports he is feeling stronger all the time.  He reports that going down stairs still bothers him sometimes.  Pain Intensity 1: 3  Pain Location 1: Knee  Pain Orientation 1: Right, Left  Dominant Side: right  Past Medical History: Steven [name]  has a past medical history of Allergic rhinitis, cause unspecified; Borderline diabetes; Chronic left sacroiliac joint pain (08/23/2014); Chronic radicular low back pain; Diverticulitis; Generalized anxiety disorder; GERD (gastroesophageal reflux disease); Hypercholesterolemia; Hypertension; Left lumbar radiculitis; Mood disorder ([place]) (07/14/2014); Obesity, unspecified (05/05/15); Other malaise and fatigue; Other testicular hypofunction; Pre-diabetes; Venous insufficiency of both lower extremities (07/14/2014); and Vitamin D deficiency. He also has no past medical history of Adverse effect of anesthesia, Difficult intubation, Malignant hyperthermia due to anesthesia, Nausea & vomiting, or Pseudocholinesterase deficiency.   He also  has a past surgical history that includes endoscopy, colon, diagnostic (05/16/10); knee arthroscopy (Bilateral, 05/06/15); and colonoscopy (05/31/15).   Current Medications:   Medication   ??? HYDROcodone-acetaminophen (NORCO) 7.5-325 mg per tablet   ??? topiramate (TOPAMAX) 50 mg tablet   ??? lamoTRIgine (LAMICTAL) 100 mg tablet   ??? diclofenac EC (VOLTAREN) 75 mg EC tablet   ??? omeprazole (PRILOSEC) 20 mg capsule   ??? metFORMIN (GLUCOPHAGE) 1,000 mg tablet   ??? VITAMIN D2 50,000 unit capsule   ??? Safety Needles 21 gauge x 1 1/2" ndle   ??? phentermine (ADIPEX-P) 37.5 mg tablet    ??? valsartan-hydrochlorothiazide (DIOVAN-HCT) 320-12.5 mg per tablet   ??? pravastatin (PRAVACHOL) 40 mg tablet   ??? metoprolol succinate (TOPROL-XL) 100 mg tablet   ??? testosterone cypionate (DEPOTESTOTERONE CYPIONATE) 200 mg/mL injection   ??? aspirin (ASPIRIN LOW-STRENGTH) 81 mg chewable tablet   ??? MULTI-VITAMIN HI-PO PO   Date Last Reviewed: 07/05/2015   Social History/Home Situation:   ?? Home Environment: Private residence  ?? Living Alone: No  ?? Support Systems: Family member(s), Friends \\ neighbors, Copy  Work/Activity History: Active  OBJECTIVE:  Outcome Measure:   Tool Used: Lower Extremity Functional Scale (LEFS)  Score:  Initial: 37/80 Most Recent: X/80 (Date: -- )   Interpretation of Score: 20 questions each scored on a 5 point scale with 0 representing "extreme difficulty or unable to perform" and 4 representing "no difficulty".  The lower the score, the greater the functional disability. 80/80 represents no disability.  Minimal detectable change is 9 points.  Score 80 79-63 62-48 47-32 31-16 15-1 0   Modifier CH CI CJ CK CL CM CN     Observation/Orthostatic Postural Assessment:  Posture Assessment: Rounded shoulders, Forward head  Palpation: Mild swelling noted in right knee along surgical incision sites, but no bruising or redness or rash.  Mild swelling noted along left ankle/calf region, but no bruising or redness or rash noted.     ROM:     L LE Assessment (AROM):  ?? Hip Flexion:  80 degrees    ?? Knee Flexion: 110 degrees  ?? Knee Extension: 3 degrees            R LE Assessment (AROM):  ?? Hip Flexion: 80 degrees    ?? Knee Flexion: 112 degrees  ?? Knee Extension: 10 degrees        Strength:     L LE Assessment (Strength):  ?? Hip Flexion: 4/5 with manual muscle testing    ?? Knee Flexion: 3/5 with manual muscle testing  ?? Knee Extension: 3/5 with manual muscle testing      R LE Assessment (Strength):  ?? Hip Flexion: 4/5 with manual muscle testing     ?? Knee Flexion: 3/5 with manual muscle testing  ?? Knee Extension: 3/5 with manual muscle testing         Special Tests: Negative  Neurological Screen:    Myotomes:     L LE Assessment (Myotomes):  ?? Within normal limits    R LE Assessment (Myotomes):  ?? Within normal limits        Dermatomes:     L LE Assessment (Dermatomes):  ?? Within normal limits    R LE Assessment (Dermatomes):  ?? Within normal limits        Reflexes:     L LE Assessment (Reflexes):  ?? Within normal limits    R LE Assessment (Reflexes):  ?? Within normal limits         Neural Tension Tests: Within normal limits  Functional Mobility:   ?? Gait Description (WDL): Exceptions to WDL  ?? Base of Support: Widened  ?? Speed/Cadence: Pace decreased (<100 feet/min)  ?? Step Length: Left shortened, Right shortened  ?? Swing Pattern: Left asymmetrical, Right asymmetrical  ?? Gait Abnormalities: Antalgic    Balance:   ?? Sitting: Intact  ?? Standing: Intact  TREATMENT:    (In addition to Assessment/Re-Assessment sessions the following treatments were rendered)  THERAPEUTIC EXERCISE: (40 minutes):  Exercises per grid below to improve mobility, strength, balance and coordination.  Required minimal verbal and manual cues to promote proper body alignment, promote proper body posture and promote proper body mechanics.  Progressed resistance, range, repetitions and complexity of movement as indicated.   Date:  07/05/2015   Activity/Exercise Parameters   Nu-Step X 10 minutes Level 5.0   Step Ups Leading Right then Left X 15 reps each   Quad Sets with towel under heel X 20 reps   Heel Slides X 20 reps   Hip Adduction #1 x 15 reps both knee's   Terminal Knee extension into Tband Blue x 20 reps Bilateral LE's   Nautilus Leg Press    Nautilus Leg Crul #60 x 20 B #    #40 x 20 B     Straight leg raise 15 reps  B LE  HEP #1   Nautilus Leg Ext #40 x 20 reps    Bridging 15 reps  HEP   LAQ - SAQ   x   Manual Therapy (      0 minutes): N/A today.    Therapeutic Modalities: Patient declined.  HEP: As above; handouts given to patient for all exercises.  ______________________________________________________________________________________________________    Treatment Assessment:  Patient tolerated treatment well without complaints of increased pain (2/10)  ?? Please explain any variance from above plan of care: Upcoming holidays/vacation  Progression/Medical Necessity:   ?? Patient is expected to demonstrate progress in strength, range of motion, balance and coordination to improve safety during daily activities.  ?? Patient demonstrates good rehab potential due to higher previous functional level.  ?? Skilled intervention continues to be required due to decreased mobility.  Compliance with Program/Exercises: Compliant   Reason for Continuation of Services/Other Comments:  ?? Patient continues to demonstrate capacity to improve overall mobility which will increase independence and increase safety.  Recommendations/Intent for next treatment session: "Treatment next visit will focus on advancements to more challenging activities".    Total Treatment Duration:  43 minutes  PT Patient Time In/Time Out  Time In: 1345  Time Out: [place], PT

## 2015-07-06 ENCOUNTER — Encounter: Attending: Critical Care Medicine | Primary: Family Medicine

## 2015-07-12 ENCOUNTER — Inpatient Hospital Stay: Payer: PRIVATE HEALTH INSURANCE | Primary: Family Medicine

## 2015-07-12 NOTE — Progress Notes (Signed)
[facility] at [facility] [address], [address] [place], [place] [address]  Phone: [phone]   Fax: [phone]    OUTPATIENT DAILY NOTE    NAME/AGE/GENDER: Steven [name] is a 59 y.o. male.     DATE: 07/12/2015    Patient did not show for his appointment for today due to unknown reasons.  Will plan to follow up on next scheduled visit.    Allene Dillon, PT

## 2015-07-14 ENCOUNTER — Inpatient Hospital Stay: Admit: 2015-07-14 | Payer: PRIVATE HEALTH INSURANCE | Primary: Family Medicine

## 2015-07-14 NOTE — Progress Notes (Addendum)
[facility], [address] [place] [address]  Phone: [phone]   Fax: [phone]   Outpatient PHYSICAL THERAPY: Daily Note 07/14/2015  Fall Risk Score: 1 (? 5 = High Risk)    ICD-10: Treatment Diagnosis:   ?? Pain in left knee (M25.562)  ?? Stiffness of left knee, not elsewhere classified (P10.258)   ?? Pain in right knee (M25.561)   ?? Stiffness of right knee, not elsewhere classified (M25.661)  REFERRING PHYSICIAN: Rella Larve, MD  MD Orders: Evaluate and Treat  Return Physician Appointment: TBD  MEDICAL/REFERRING DIAGNOSIS:  ?? Other tear of medial meniscus, current injury, right knee, initial encounter [S83.241A]  ?? Other tear of medial meniscus, current injury, left knee, initial encounter [S83.242A]  ?? Other tear of lateral meniscus, current injury, right knee, initial encounter [S83.281A]  ?? Other tear of lateral meniscus, current injury, left knee, initial encounter [S83.282A]  ?? Unilateral primary osteoarthritis, right knee [M17.11]  ?? 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 Nsaids (Non-Steroidal Anti-Inflammatory Drug) Other (comments)     Cause pedal edema if taken for a number of days in a row      ASSESSMENT:  ????????This section established at most recent assessment??????????  Patient presents with decreased mobility, decreased strength, and pain in bilateral knees secondary to s/p bilateral meniscal repair.  Patient has attended a total of 12 scheduled physical therapy visits including initial evaluation on 06/06/2015.  Treatment has consisted of range of motion, strengthening, stretching, and modalities to improve overall mobility and performance with activities of daily living.     PROBLEM LIST (Impairments causing functional limitations):   1. Decreased Strength affecting function  2. Decreased ADL/Functional Activities  3. Decreased Flexibility/joint mobility  4. Increased Pain affecting function  5. Decreased Activity tolerance   GOALS: (Goals have been discussed and agreed upon with patient.)  SHORT-TERM FUNCTIONAL GOALS: Time Frame: 4 weeks  1. Patient will be independent with home exercise program without exacerbation of symptoms or cueing needed--goal met.   DISCHARGE GOALS: Time Frame: 12 weeks  1. Patient will be independent with all ADLs with minimal onset of knee pain and no deficits with daily tasks--goal ongoing.  2. Patient will report no fear avoidance with social or recreational activities due to bilateral knee pain--goal ongoing.  3. Patient will score less than or equal to 60/80 on Lower Extremity Functional Scale with minimal effect of knee pain on patient's ability to manage every day life activities--goal ongoing.   REHABILITATION POTENTIAL FOR STATED GOALS: GoodPLAN OF CARE:  INTERVENTIONS PLANNED: (Benefits and precautions of physical therapy have been discussed with the patient.)  1. balance exercise  2. cold  3. electrical stimulation  4. gait training  5. heat  6. home exercise program (HEP)  7. manual therapy  8. neuromuscular re-education/strengthening  9. range of motion: active/assisted/passive  10. therapeutic activities  11. therapeutic exercise/strengthening  12. ultrasound  TREATMENT PLAN EFFECTIVE DATES: 06/06/2015 TO 09/06/2015  FREQUENCY/DURATION: Follow patient 2 times a week for 12 weeks to address above goals.                SUBJECTIVE:  History of Present Injury/Illness (Reason for Referral): Patient reports a chronic history of knee and back pain. He reports that he had bilateral meniscal surgery on  05/06/2015.  He reports that he had some swelling in both legs that was painful.  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  He reports that  his left knee is feeling good, just minimal soreness with activity. He reports that his right knee has been more painful and he reports that his right knee was more painful before surgery.  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  Present Symptoms: 07/14/2015: Patient reports he is feeling really good overall.  Pain Intensity 1: 3  Pain Location 1: Knee  Pain Orientation 1: Right, Left  Dominant Side: right  Past Medical History: Mr. 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 He also has no past medical history of Adverse effect of anesthesia, Difficult intubation, Malignant hyperthermia due to anesthesia, Nausea & vomiting, or Pseudocholinesterase deficiency.   He also  has a past surgical history that includes endoscopy, colon, diagnostic (05/16/10); knee arthroscopy (Bilateral, 05/06/15); and colonoscopy (05/31/15).   Current Medications:   Medication   ??? HYDROcodone-acetaminophen (NORCO) 7.5-325 mg per tablet   ??? topiramate (TOPAMAX) 50 mg tablet   ??? lamoTRIgine (LAMICTAL) 100 mg tablet   ??? diclofenac EC (VOLTAREN) 75 mg EC tablet   ??? omeprazole (PRILOSEC) 20 mg capsule   ??? metFORMIN (GLUCOPHAGE) 1,000 mg tablet   ??? VITAMIN D2 50,000 unit capsule   ??? Safety Needles 21 gauge x 1 1/2" ndle   ??? phentermine (ADIPEX-P) 37.5 mg tablet    ??? valsartan-hydrochlorothiazide (DIOVAN-HCT) 320-12.5 mg per tablet   ??? pravastatin (PRAVACHOL) 40 mg tablet   ??? metoprolol succinate (TOPROL-XL) 100 mg tablet   ??? testosterone cypionate (DEPOTESTOTERONE CYPIONATE) 200 mg/mL injection   ??? aspirin (ASPIRIN LOW-STRENGTH) 81 mg chewable tablet   ??? MULTI-VITAMIN HI-PO PO   Date Last Reviewed: 07/14/2015   Social History/Home Situation:   ?? Home Environment: Private residence  ?? Living Alone: No  ?? Support Systems: Family member(s), Friends \\ neighbors, Copy  Work/Activity History: Active  OBJECTIVE:  Outcome Measure:   Tool Used: Lower Extremity Functional Scale (LEFS)  Score:  Initial: 37/80 Most Recent: X/80 (Date: -- )   Interpretation of Score: 20 questions each scored on a 5 point scale with 0 representing "extreme difficulty or unable to perform" and 4 representing "no difficulty".  The lower the score, the greater the functional disability. 80/80 represents no disability.  Minimal detectable change is 9 points.  Score 80 79-63 62-48 47-32 31-16 15-1 0   Modifier CH CI CJ CK CL CM CN     Observation/Orthostatic Postural Assessment:  Posture Assessment: Rounded shoulders, Forward head  Palpation: Mild swelling noted in right knee along surgical incision sites, but no bruising or redness or rash.  Mild swelling noted along left ankle/calf region, but no bruising or redness or rash noted.     ROM:     L LE Assessment (AROM):  ?? Hip Flexion: 80 degrees    ?? Knee Flexion: 110 degrees  ??  Knee Extension: 3 degrees            R LE Assessment (AROM):  ?? Hip Flexion: 80 degrees    ?? Knee Flexion: 112 degrees  ?? Knee Extension: 10 degrees        Strength:     L LE Assessment (Strength):  ?? Hip Flexion: 4/5 with manual muscle testing    ?? Knee Flexion: 3/5 with manual muscle testing  ?? Knee Extension: 3/5 with manual muscle testing      R LE Assessment (Strength):  ?? Hip Flexion: 4/5 with manual muscle testing     ?? Knee Flexion: 3/5 with manual muscle testing  ?? Knee Extension: 3/5 with manual muscle testing         Special Tests: Negative  Neurological Screen:    Myotomes:     L LE Assessment (Myotomes):  ?? Within normal limits    R LE Assessment (Myotomes):  ?? Within normal limits        Dermatomes:     L LE Assessment (Dermatomes):  ?? Within normal limits    R LE Assessment (Dermatomes):  ?? Within normal limits        Reflexes:     L LE Assessment (Reflexes):  ?? Within normal limits    R LE Assessment (Reflexes):  ?? Within normal limits         Neural Tension Tests: Within normal limits  Functional Mobility:   ?? Gait Description (WDL): Exceptions to WDL  ?? Base of Support: Widened  ?? Speed/Cadence: Pace decreased (<100 feet/min)  ?? Step Length: Left shortened, Right shortened  ?? Swing Pattern: Left asymmetrical, Right asymmetrical  ?? Gait Abnormalities: Antalgic    Balance:   ?? Sitting: Intact  ?? Standing: Intact  TREATMENT:    (In addition to Assessment/Re-Assessment sessions the following treatments were rendered)  THERAPEUTIC EXERCISE: (40 minutes):  Exercises per grid below to improve mobility, strength, balance and coordination.  Required minimal verbal and manual cues to promote proper body alignment, promote proper body posture and promote proper body mechanics.  Progressed resistance, range, repetitions and complexity of movement as indicated.   Date:  07/14/2015   Activity/Exercise Parameters   Nu-Step X 10 minutes Level 5.0   Step Ups Leading Right then Left X 15 reps each   Quad Sets with towel under heel X 20 reps   Heel Slides X 20 reps   Hip Adduction #1 x 15 reps both knee's   Terminal Knee extension into Tband Blue x 20 reps Bilateral LE's   Nautilus Leg Press    Nautilus Leg Crul #60 x 20 B #    #40 x 20 B     Straight leg raise 15 reps  B LE  HEP #1   Nautilus Leg Ext #40 x 20 reps    Bridging 15 reps  HEP   LAQ - SAQ   x   Manual Therapy (      0 minutes): N/A today.    Therapeutic Modalities: Patient declined.  HEP: As above; handouts given to patient for all exercises.  ______________________________________________________________________________________________________    Treatment Assessment:  Patient tolerated treatment well without complaints of increased pain (2/10).   ?? Please explain any variance from above plan of care: Upcoming holidays/vacation  Progression/Medical Necessity:   ?? Patient is expected to demonstrate progress in strength, range of motion, balance and coordination to improve safety during daily activities.  ?? Patient demonstrates good rehab potential due to higher previous functional level.  ?? Skilled intervention continues to be required due to decreased mobility.  Compliance with Program/Exercises: Compliant   Reason for Continuation of Services/Other Comments:  ?? Patient continues to demonstrate capacity to improve overall mobility which will increase independence and increase safety.  Recommendations/Intent for next treatment session: "Treatment next visit will focus on advancements to more challenging activities".    Total Treatment Duration:    PT Patient Time In/Time Out  Time In: 1515  Time Out: [place], PT

## 2015-07-20 ENCOUNTER — Ambulatory Visit: Payer: PRIVATE HEALTH INSURANCE | Primary: Family Medicine

## 2015-07-21 ENCOUNTER — Inpatient Hospital Stay: Payer: PRIVATE HEALTH INSURANCE | Primary: Family Medicine

## 2015-07-21 ENCOUNTER — Inpatient Hospital Stay: Admit: 2015-07-21 | Payer: PRIVATE HEALTH INSURANCE | Primary: Family Medicine

## 2015-07-21 NOTE — Progress Notes (Signed)
[facility], [address] [place] [address]  Phone: [phone]   Fax: [phone]   Outpatient PHYSICAL THERAPY: Daily Note 07/21/2015  Fall Risk Score: 1 (? 5 = High Risk)    ICD-10: Treatment Diagnosis:   ?? Pain in left knee (M25.562)  ?? Stiffness of left knee, not elsewhere classified (W54.627)   ?? Pain in right knee (M25.561)   ?? Stiffness of right knee, not elsewhere classified (M25.661)  REFERRING PHYSICIAN: Rella Larve, MD  MD Orders: Evaluate and Treat  Return Physician Appointment: TBD  MEDICAL/REFERRING DIAGNOSIS:  ?? Other tear of medial meniscus, current injury, right knee, initial encounter [S83.241A]  ?? Other tear of medial meniscus, current injury, left knee, initial encounter [S83.242A]  ?? Other tear of lateral meniscus, current injury, right knee, initial encounter [S83.281A]  ?? Other tear of lateral meniscus, current injury, left knee, initial encounter [S83.282A]  ?? Unilateral primary osteoarthritis, right knee [M17.11]  ?? 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 Nsaids (Non-Steroidal Anti-Inflammatory Drug) Other (comments)     Cause pedal edema if taken for a number of days in a row      ASSESSMENT:  ????????This section established at most recent assessment??????????  Patient presents with decreased mobility, decreased strength, and pain in bilateral knees secondary to s/p bilateral meniscal repair.  Patient has attended a total of 13 scheduled physical therapy visits including initial evaluation on 06/06/2015.  Treatment has consisted of range of motion, strengthening, stretching, and modalities to improve overall mobility and performance with activities of daily living.     PROBLEM LIST (Impairments causing functional limitations):   1. Decreased Strength affecting function  2. Decreased ADL/Functional Activities  3. Decreased Flexibility/joint mobility  4. Increased Pain affecting function  5. Decreased Activity tolerance   GOALS: (Goals have been discussed and agreed upon with patient.)  SHORT-TERM FUNCTIONAL GOALS: Time Frame: 4 weeks  1. Patient will be independent with home exercise program without exacerbation of symptoms or cueing needed--goal met.   DISCHARGE GOALS: Time Frame: 12 weeks  1. Patient will be independent with all ADLs with minimal onset of knee pain and no deficits with daily tasks--goal ongoing.  2. Patient will report no fear avoidance with social or recreational activities due to bilateral knee pain--goal ongoing.  3. Patient will score less than or equal to 60/80 on Lower Extremity Functional Scale with minimal effect of knee pain on patient's ability to manage every day life activities--goal ongoing.   REHABILITATION POTENTIAL FOR STATED GOALS: GoodPLAN OF CARE:  INTERVENTIONS PLANNED: (Benefits and precautions of physical therapy have been discussed with the patient.)  1. balance exercise  2. cold  3. electrical stimulation  4. gait training  5. heat  6. home exercise program (HEP)  7. manual therapy  8. neuromuscular re-education/strengthening  9. range of motion: active/assisted/passive  10. therapeutic activities  11. therapeutic exercise/strengthening  12. ultrasound  TREATMENT PLAN EFFECTIVE DATES: 06/06/2015 TO 09/06/2015  FREQUENCY/DURATION: Follow patient 2 times a week for 12 weeks to address above goals.                SUBJECTIVE:  History of Present Injury/Illness (Reason for Referral): Patient reports a chronic history of knee and back pain. He reports that he had bilateral meniscal surgery on  05/06/2015.  He reports that he had some swelling in both legs that was painful.  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  He reports that  his left knee is feeling good, just minimal soreness with activity. He reports that his right knee has been more painful and he reports that his right knee was more painful before surgery.  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  Present Symptoms: 07/21/2015: Patient reports he did a lot of walking on uneven terrain yesterday and so he is hurting today.  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 He also has no past medical history of Adverse effect of anesthesia, Difficult intubation, Malignant hyperthermia due to anesthesia, Nausea & vomiting, or Pseudocholinesterase deficiency.   He also  has a past surgical history that includes endoscopy, colon, diagnostic (05/16/10); knee arthroscopy (Bilateral, 05/06/15); and colonoscopy (05/31/15).   Current Medications:   Medication   ??? HYDROcodone-acetaminophen (NORCO) 7.5-325 mg per tablet   ??? topiramate (TOPAMAX) 50 mg tablet   ??? lamoTRIgine (LAMICTAL) 100 mg tablet   ??? diclofenac EC (VOLTAREN) 75 mg EC tablet   ??? omeprazole (PRILOSEC) 20 mg capsule   ??? metFORMIN (GLUCOPHAGE) 1,000 mg tablet   ??? VITAMIN D2 50,000 unit capsule   ??? Safety Needles 21 gauge x 1 1/2" ndle   ??? phentermine (ADIPEX-P) 37.5 mg tablet    ??? valsartan-hydrochlorothiazide (DIOVAN-HCT) 320-12.5 mg per tablet   ??? pravastatin (PRAVACHOL) 40 mg tablet   ??? metoprolol succinate (TOPROL-XL) 100 mg tablet   ??? testosterone cypionate (DEPOTESTOTERONE CYPIONATE) 200 mg/mL injection   ??? aspirin (ASPIRIN LOW-STRENGTH) 81 mg chewable tablet   ??? MULTI-VITAMIN HI-PO PO   Date Last Reviewed: 07/21/2015   Social History/Home Situation:   ?? Home Environment: Private residence  ?? Living Alone: No  ?? Support Systems: Family member(s), Friends \\ neighbors, Copy  Work/Activity History: Active  OBJECTIVE:  Outcome Measure:   Tool Used: Lower Extremity Functional Scale (LEFS)  Score:  Initial: 37/80 Most Recent: X/80 (Date: -- )   Interpretation of Score: 20 questions each scored on a 5 point scale with 0 representing "extreme difficulty or unable to perform" and 4 representing "no difficulty".  The lower the score, the greater the functional disability. 80/80 represents no disability.  Minimal detectable change is 9 points.  Score 80 79-63 62-48 47-32 31-16 15-1 0   Modifier CH CI CJ CK CL CM CN     Observation/Orthostatic Postural Assessment:  Posture Assessment: Rounded shoulders, Forward head  Palpation: Mild swelling noted in right knee along surgical incision sites, but no bruising or redness or rash.  Mild swelling noted along left ankle/calf region, but no bruising or redness or rash noted.     ROM:     L LE Assessment (AROM):  ?? Hip Flexion: 80 degrees    ??  Knee Flexion: 110 degrees  ?? Knee Extension: 3 degrees            R LE Assessment (AROM):  ?? Hip Flexion: 80 degrees    ?? Knee Flexion: 112 degrees  ?? Knee Extension: 10 degrees        Strength:     L LE Assessment (Strength):  ?? Hip Flexion: 4/5 with manual muscle testing    ?? Knee Flexion: 3/5 with manual muscle testing  ?? Knee Extension: 3/5 with manual muscle testing      R LE Assessment (Strength):  ?? Hip Flexion: 4/5 with manual muscle testing     ?? Knee Flexion: 3/5 with manual muscle testing  ?? Knee Extension: 3/5 with manual muscle testing         Special Tests: Negative  Neurological Screen:    Myotomes:     L LE Assessment (Myotomes):  ?? Within normal limits    R LE Assessment (Myotomes):  ?? Within normal limits        Dermatomes:     L LE Assessment (Dermatomes):  ?? Within normal limits    R LE Assessment (Dermatomes):  ?? Within normal limits        Reflexes:     L LE Assessment (Reflexes):  ?? Within normal limits    R LE Assessment (Reflexes):  ?? Within normal limits         Neural Tension Tests: Within normal limits  Functional Mobility:   ?? Gait Description (WDL): Exceptions to WDL  ?? Base of Support: Widened  ?? Speed/Cadence: Pace decreased (<100 feet/min)  ?? Step Length: Left shortened, Right shortened  ?? Swing Pattern: Left asymmetrical, Right asymmetrical  ?? Gait Abnormalities: Antalgic    Balance:   ?? Sitting: Intact  ?? Standing: Intact  TREATMENT:    (In addition to Assessment/Re-Assessment sessions the following treatments were rendered)  THERAPEUTIC EXERCISE: (40 minutes):  Exercises per grid below to improve mobility, strength, balance and coordination.  Required minimal verbal and manual cues to promote proper body alignment, promote proper body posture and promote proper body mechanics.  Progressed resistance, range, repetitions and complexity of movement as indicated.   Date:  07/21/2015   Activity/Exercise Parameters   Nu-Step X 10 minutes Level 5.0   Step Ups Leading Right then Left X 15 reps each   Quad Sets with towel under heel X 20 reps   Heel Slides X 20 reps   Hip Adduction #1 x 15 reps both knee's   Terminal Knee extension into Tband Blue x 20 reps Bilateral LE's   Nautilus Leg Press    Nautilus Leg Crul #60 x 20 B #    #40 x 20 B     Straight leg raise 15 reps  B LE  HEP #1   Nautilus Leg Ext #40 x 20 reps    Bridging 15 reps  HEP   Manual Therapy (      0 minutes): N/A today.    Therapeutic Modalities: Patient declined.  HEP: As above; handouts given to patient for all exercises.  ______________________________________________________________________________________________________    Treatment Assessment:  Patient tolerated treatment well without complaints of increased pain (2/10).   ?? Please explain any variance from above plan of care: Upcoming holidays/vacation  Progression/Medical Necessity:   ?? Patient is expected to demonstrate progress in strength, range of motion, balance and coordination to improve safety during daily activities.  ?? Patient demonstrates good rehab potential due to higher previous functional level.  ?? Skilled intervention continues to be required due to decreased mobility.  Compliance with Program/Exercises: Compliant   Reason for Continuation of Services/Other Comments:  ?? Patient continues to demonstrate capacity to improve overall mobility which will increase independence and increase safety.  Recommendations/Intent for next treatment session: "Treatment next visit will focus on advancements to more challenging activities".    Total Treatment Duration:    PT Patient Time In/Time Out  Time In: 1115  Time Out: [facility], PT

## 2015-07-21 NOTE — Progress Notes (Signed)
[facility], [address] [place] [address]  Phone: [phone]   Fax: [phone]   Outpatient PHYSICAL THERAPY: Discontinuation Summary 07/21/2015  Fall Risk Score: 1 (? 5 = High Risk)    ICD-10: Treatment Diagnosis:   ?? Pain in left knee (M25.562)  ?? Stiffness of left knee, not elsewhere classified (H85.277)   ?? Pain in right knee (M25.561)   ?? Stiffness of right knee, not elsewhere classified (M25.661)  REFERRING PHYSICIAN: Rella Larve, MD  MD Orders: Evaluate and Treat  Return Physician Appointment: TBD  MEDICAL/REFERRING DIAGNOSIS:  ?? Other tear of medial meniscus, current injury, right knee, initial encounter [S83.241A]  ?? Other tear of medial meniscus, current injury, left knee, initial encounter [S83.242A]  ?? Other tear of lateral meniscus, current injury, right knee, initial encounter [S83.281A]  ?? Other tear of lateral meniscus, current injury, left knee, initial encounter [S83.282A]  ?? Unilateral primary osteoarthritis, right knee [M17.11]  ?? 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 Nsaids (Non-Steroidal Anti-Inflammatory Drug) Other (comments)     Cause pedal edema if taken for a number of days in a row      ASSESSMENT:  ????????This section established at most recent assessment??????????  Patient presented with decreased mobility, decreased strength, and pain in bilateral knees secondary to s/p bilateral meniscal repair during initial evaluation, which improved during therapy sessions.  Patient attended a total of 13 scheduled physical therapy visits including initial evaluation on 06/06/2015.  Treatment consisted of range of motion, strengthening, stretching, and modalities to improve overall mobility and performance  with activities of daily living.   Patient did not additional physical therapy visits secondary to upcoming surgery.  Patient's therapy will be discontinued at this time.  We will be happy to re-assess him with a change in his status and a new order from his doctor.  Thank you for the opportunity to serve this patient.   GOALS: (Goals have been discussed and agreed upon with patient.)  SHORT-TERM FUNCTIONAL GOALS: Time Frame: 4 weeks  1. Patient will be independent with home exercise program without exacerbation of symptoms or cueing needed--goal met.   DISCHARGE GOALS: Time Frame: 12 weeks  1. Patient will be independent with all ADLs with minimal onset of knee pain and no deficits with daily tasks--goal unmet.  2. Patient will report no fear avoidance with social or recreational activities due to bilateral knee pain--goal ongoing.  3. Patient will score less than or equal to 60/80 on Lower Extremity Functional Scale with minimal effect of knee pain on patient's ability to manage every day life activities--goal unmet.                 OBJECTIVE:  Outcome Measure:   Tool Used: Lower Extremity Functional Scale (LEFS)  Score:  Initial: 37/80 Most Recent: X/80 (Date: -- )   Interpretation of Score: 20 questions each scored on a 5 point scale with 0 representing "extreme difficulty or unable to perform" and 4 representing "no difficulty".  The lower the score, the greater the functional disability. 80/80 represents no disability.  Minimal detectable change is 9 points.  Score 80 79-63 62-48 47-32 31-16 15-1 0   Modifier CH CI CJ CK CL CM CN     Observation/Orthostatic Postural Assessment:  Posture Assessment:  Rounded shoulders, Forward head  Palpation: Mild swelling noted in right knee along surgical incision sites, but no bruising or redness or rash.  Mild swelling noted along left ankle/calf region, but no bruising or redness or rash noted.     ROM:     L LE Assessment (AROM):  ?? Hip Flexion: 80 degrees     ?? Knee Flexion: 110 degrees  ?? Knee Extension: 3 degrees            R LE Assessment (AROM):  ?? Hip Flexion: 80 degrees    ?? Knee Flexion: 112 degrees  ?? Knee Extension: 10 degrees        Strength:     L LE Assessment (Strength):  ?? Hip Flexion: 4/5 with manual muscle testing    ?? Knee Flexion: 3/5 with manual muscle testing  ?? Knee Extension: 3/5 with manual muscle testing      R LE Assessment (Strength):  ?? Hip Flexion: 4/5 with manual muscle testing    ?? Knee Flexion: 3/5 with manual muscle testing  ?? Knee Extension: 3/5 with manual muscle testing         Special Tests: Negative  Neurological Screen:    Myotomes:     L LE Assessment (Myotomes):  ?? Within normal limits    R LE Assessment (Myotomes):  ?? Within normal limits        Dermatomes:     L LE Assessment (Dermatomes):  ?? Within normal limits    R LE Assessment (Dermatomes):  ?? Within normal limits        Reflexes:     L LE Assessment (Reflexes):  ?? Within normal limits    R LE Assessment (Reflexes):  ?? Within normal limits         Neural Tension Tests: Within normal limits  Functional Mobility:   ?? Gait Description (WDL): Exceptions to WDL  ?? Base of Support: Widened  ?? Speed/Cadence: Pace decreased (<100 feet/min)  ?? Step Length: Left shortened, Right shortened  ?? Swing Pattern: Left asymmetrical, Right asymmetrical  ?? Gait Abnormalities: Antalgic    Balance:   ?? Sitting: Intact  ?? Standing: Intact    ______________________________________________________________________________________________________    [name], PT

## 2015-07-21 NOTE — Other (Addendum)
Pt no show for PAT appointment. Call to pt at [phone] to inform to reschedule with Dr Leontine Locket office PAT appointment.     Call to Dr Leontine Locket office to make sure pt not in office. [phone]. Carollee Herter stated she sent pt to PAT 20 minutes ago. .     Pt not in waiting room.  Recall to [facility] to inform pt never showed and self RN LM with pt informing he will need to reschedule.

## 2015-07-25 ENCOUNTER — Inpatient Hospital Stay: Admit: 2015-07-25 | Payer: PRIVATE HEALTH INSURANCE | Primary: Family Medicine

## 2015-07-25 DIAGNOSIS — Z01810 Encounter for preprocedural cardiovascular examination: Secondary | ICD-10-CM

## 2015-07-25 LAB — CBC WITH AUTOMATED DIFF
ABS. BASOPHILS: 0 10*3/uL (ref 0.0–0.2)
ABS. EOSINOPHILS: 0.1 10*3/uL (ref 0.0–0.8)
ABS. IMM. GRANS.: 0 10*3/uL (ref 0.0–0.5)
ABS. LYMPHOCYTES: 2 10*3/uL (ref 0.5–4.6)
ABS. MONOCYTES: 0.4 10*3/uL (ref 0.1–1.3)
ABS. NEUTROPHILS: 3.8 10*3/uL (ref 1.7–8.2)
BASOPHILS: 1 % (ref 0.0–2.0)
EOSINOPHILS: 1 % (ref 0.5–7.8)
HCT: 58 % — ABNORMAL HIGH (ref 41.1–50.3)
HGB: 19.9 g/dL — ABNORMAL HIGH (ref 13.6–17.2)
IMMATURE GRANULOCYTES: 0 % (ref 0.0–5.0)
LYMPHOCYTES: 31 % (ref 13–44)
MCH: 31.7 PG (ref 26.1–32.9)
MCHC: 34.3 g/dL (ref 31.4–35.0)
MCV: 92.4 FL (ref 79.6–97.8)
MONOCYTES: 7 % (ref 4.0–12.0)
MPV: 11.3 FL (ref 10.8–14.1)
NEUTROPHILS: 60 % (ref 43–78)
PLATELET: 167 10*3/uL (ref 150–450)
RBC: 6.28 M/uL — ABNORMAL HIGH (ref 4.23–5.67)
RDW: 13.6 % (ref 11.9–14.6)
WBC: 6.3 10*3/uL (ref 4.3–11.1)

## 2015-07-25 LAB — METABOLIC PANEL, BASIC
Anion gap: 9 mmol/L (ref 7–16)
BUN: 24 MG/DL — ABNORMAL HIGH (ref 6–23)
CO2: 28 mmol/L (ref 21–32)
Calcium: 9.6 MG/DL (ref 8.3–10.4)
Chloride: 104 mmol/L (ref 98–107)
Creatinine: 1.62 MG/DL — ABNORMAL HIGH (ref 0.8–1.5)
GFR est AA: 57 mL/min/{1.73_m2} — ABNORMAL LOW (ref >60)
GFR est non-AA: 47 mL/min/{1.73_m2} — ABNORMAL LOW (ref >60)
Glucose: 134 mg/dL — ABNORMAL HIGH (ref 65–100)
Potassium: 4.5 mmol/L (ref 3.5–5.1)
Sodium: 141 mmol/L (ref 136–145)

## 2015-07-25 LAB — URINALYSIS W/ RFLX MICROSCOPIC
Bilirubin: NEGATIVE
Blood: NEGATIVE
Glucose: NEGATIVE mg/dL
Ketone: NEGATIVE mg/dL
Leukocyte Esterase: NEGATIVE
Nitrites: NEGATIVE
Protein: NEGATIVE mg/dL
Specific gravity: 1.019 (ref 1.001–1.023)
Urobilinogen: 0.2 EU/dL (ref 0.2–1.0)
pH (UA): 5.5 (ref 5.0–9.0)

## 2015-07-25 LAB — HEMOGLOBIN A1C WITH EAG
Est. average glucose: 126 mg/dL
Hemoglobin A1c: 6 % (ref 4.8–6.0)

## 2015-07-25 LAB — GLUCOSE, POC: Glucose (POC): 138 mg/dL — ABNORMAL HIGH (ref 65–100)

## 2015-07-25 NOTE — Other (Signed)
Chart to chart room for anesthesia to review bmp.

## 2015-07-25 NOTE — Other (Signed)
Bmp to desk for review per anesthesia.

## 2015-07-25 NOTE — Other (Signed)
Recent Results (from the past 12 hour(s))   CBC WITH AUTOMATED DIFF    Collection Time: 07/25/15  8:30 AM   Result Value Ref Range    WBC 6.3 4.3 - 11.1 K/uL    RBC 6.28 (H) 4.23 - 5.67 M/uL    HGB 19.9 (H) 13.6 - 17.2 g/dL    HCT 96.0 (H) 45.4 - 50.3 %    MCV 92.4 79.6 - 97.8 FL    MCH 31.7 26.1 - 32.9 PG    MCHC 34.3 31.4 - 35.0 g/dL    RDW 09.8 11.9 - 14.7 %    PLATELET 167 150 - 450 K/uL    MPV 11.3 10.8 - 14.1 FL    DF AUTOMATED      NEUTROPHILS 60 43 - 78 %    LYMPHOCYTES 31 13 - 44 %    MONOCYTES 7 4.0 - 12.0 %    EOSINOPHILS 1 0.5 - 7.8 %    BASOPHILS 1 0.0 - 2.0 %    IMMATURE GRANULOCYTES 0.0 0.0 - 5.0 %    ABS. NEUTROPHILS 3.8 1.7 - 8.2 K/UL    ABS. LYMPHOCYTES 2.0 0.5 - 4.6 K/UL    ABS. MONOCYTES 0.4 0.1 - 1.3 K/UL    ABS. EOSINOPHILS 0.1 0.0 - 0.8 K/UL    ABS. BASOPHILS 0.0 0.0 - 0.2 K/UL    ABS. IMM. GRANS. 0.0 0.0 - 0.5 K/UL   METABOLIC PANEL, BASIC    Collection Time: 07/25/15  8:30 AM   Result Value Ref Range    Sodium 141 136 - 145 mmol/L    Potassium 4.5 3.5 - 5.1 mmol/L    Chloride 104 98 - 107 mmol/L    CO2 28 21 - 32 mmol/L    Anion gap 9 7 - 16 mmol/L    Glucose 134 (H) 65 - 100 mg/dL    BUN 24 (H) 6 - 23 MG/DL    Creatinine 8.29 (H) 0.8 - 1.5 MG/DL    GFR est AA 57 (L) >56 ml/min/1.67m2    GFR est non-AA 47 (L) >60 ml/min/1.59m2    Calcium 9.6 8.3 - 10.4 MG/DL   HEMOGLOBIN O1H WITH EAG    Collection Time: 07/25/15  8:30 AM   Result Value Ref Range    Hemoglobin A1c 6.0 4.8 - 6.0 %    Est. average glucose 126 mg/dL   GLUCOSE, POC    Collection Time: 07/25/15  8:33 AM   Result Value Ref Range    Glucose (POC) 138 (H) 65 - 100 mg/dL   URINALYSIS W/ RFLX MICROSCOPIC    Collection Time: 07/25/15  9:00 AM   Result Value Ref Range    Color YELLOW      Appearance CLOUDY      Specific gravity 1.019 1.001 - 1.023      pH (UA) 5.5 5.0 - 9.0      Protein NEGATIVE  NEG mg/dL    Glucose NEGATIVE  mg/dL    Ketone NEGATIVE  NEG mg/dL    Bilirubin NEGATIVE  NEG      Blood NEGATIVE  NEG       Urobilinogen 0.2 0.2 - 1.0 EU/dL    Nitrites NEGATIVE  NEG      Leukocyte Esterase NEGATIVE  NEG

## 2015-07-25 NOTE — Anesthesia Pre-Procedure Evaluation (Addendum)
Anesthetic History   No history of anesthetic complications            Review of Systems / Medical History  Patient summary reviewed, nursing notes reviewed and pertinent labs reviewed    Pulmonary        Sleep apnea: CPAP           Neuro/Psych              Cardiovascular    Hypertension: well controlled              Exercise tolerance: >4 METS     GI/Hepatic/Renal     GERD: well controlled           Endo/Other    Diabetes: type 2    Obesity     Other Findings   Comments: Significant polycythemia noted and discussed w surgeon.         Physical Exam    Airway  Mallampati: II  TM Distance: 4 - 6 cm  Neck ROM: normal range of motion   Mouth opening: Diminished (comment)     Cardiovascular    Rhythm: regular           Dental         Pulmonary  Breath sounds clear to auscultation               Abdominal         Other Findings            Anesthetic Plan    ASA: 2  Anesthesia type: general          Induction: Intravenous  Anesthetic plan and risks discussed with: Patient

## 2015-07-25 NOTE — Other (Signed)
POC glucose 138 . Instructed  Patient that if blood sugar 300 or > , surgery may be cancelled. Patient verified name, DOB, and surgery as listed in [facility].    TYPE  CASE:3  Labs per surgeon: CBC WITH DIFF, BMP, URINE, HGA1C:labs PENDING   Labs per anesthesia protocol: TYPE AND SCREEN ON THE DOS  EKG  :  TODAY PER GRID. OLD EKG, OFFICE NOTE AND ECHO TO CHART PER PROTOCOL.      Patient provided with handouts including guide to surgery , transfusions, pain management and hand hygiene for the family and community. Pt verbalizes understanding of all pre-op instructions .  Instructed that family must be present in building at all times.    Hibiclens and instructions given per hospital policy.      Instructed patient to continue  previous medications as prescribed prior to surgery and  to take the following medications the day of surgery according to anesthesia guidelines : ASPIRIN, TOPAMAX       Continue all previous medications unless otherwise directed. Original medication prescription bottles   visualized during patient appointment.      Instructed patient to hold  the following medications prior to surgery: MULTIVITAMIN      Patient verbalized understanding of all instructions and provided all medical/health information to the best of their ability.     Road to Recovery Spine surgery patient guide given. Instructed on incentive spirometry with return demonstration. Long handled prehab sponge given with instructions for use.  Patient viewed spine prehab video.

## 2015-07-26 ENCOUNTER — Encounter

## 2015-07-26 LAB — EKG, 12 LEAD, INITIAL
Atrial Rate: 74 {beats}/min
Calculated P Axis: 71 degrees
Calculated R Axis: -19 degrees
Calculated T Axis: 75 degrees
Diagnosis: NORMAL
P-R Interval: 200 ms
Q-T Interval: 378 ms
QRS Duration: 102 ms
QTC Calculation (Bezet): 419 ms
Ventricular Rate: 74 {beats}/min

## 2015-07-26 NOTE — Other (Signed)
Dr. Langley Gauss reviewed BMP results.  No orders received.

## 2015-07-27 MED ORDER — CEFAZOLIN 3G IN 100 ML 0.9% NS PREMIX
3 gram/100ml | Freq: Once | INTRAVENOUS | Status: AC
Start: 2015-07-27 — End: 2015-07-28

## 2015-07-27 MED FILL — CEFAZOLIN 3G IN 100 ML 0.9% NS PREMIX: 3 gram/100ml | INTRAVENOUS | Qty: 100

## 2015-08-12 ENCOUNTER — Encounter

## 2015-08-15 ENCOUNTER — Encounter

## 2015-08-15 NOTE — Progress Notes (Signed)
New Patient Abstract    Reason for Referral:  Polycythemia    Referring Provider:  Dr. Dorene GrebeMina    Primary Care Provider:  Dr. Alfredo Bachecil    Family History of Cancer/Hematologic disorders:  Unknown    Presenting Symptoms:  Abnormal Labs    Narrative with recent with Results/Procedures/Biopsies and Dates completed:  Steven [name] presented to Dr. Dorene GrebeMina for evaluation and surgery consultation.  Labs were drawn at this visit and history indicates this is a chronic problem.     09/14/2013 08:00 04/21/2014 12:18 11/16/2014 14:07 04/29/2015 09:46 07/25/2015 08:30   WBC     6.3   WBC (POC) 7.5 9.0 6.9 6.7    RBC     6.28 (H)   RBC (POC) 6.21 (A) 6.58 H 6.48 (A) 6.30 (A)    HGB     19.9 (H)   HGB (POC) 18.4 (A) 19.5 H 19.5 (A) 19.3 (A)    HCT     58.0 (H)   Hematocrit (POC) 56.9 60.9 H 58.9 61.5 (A)    MCV     92.4   MCV (POC) 91.7 92.5 91.0 97.6    MCH     31.7   MCH (POC) 29.7 29.7 30.1 30.7    MCHC     34.3   MCHC (POC) 32.4 (A) 32.1 L 33.0 31.4 (A)    RDW     13.6   RDW (POC) 14.5 (A) 15.6 H 15.6 (A) 15.8 (A)    PLATELET     167   PLATELET (POC) 197 183 160 165    MPV     11.3   MPV (POC) 8.2 7.9 8.4 7.5 (A)    NEUTROPHILS     60   GRANULOCYTES (POC) 67.8 53.5 65.5 63.8    LYMPHOCYTES     31   LYMPHOCYTES (POC) 30.7 41.1 25.5 30.9    MONOCYTES     7   MONOCYTES (POC) 1.5 (A) 5.1 9.0 5.3    EOSINOPHILS     1   BASOPHILS     1   IMMATURE GRANULOCYTES     0.0   DF     AUTOMATED   ABS. NEUTROPHILS     3.8   ABS. GRANS (POC) 5.1 4.8 4.5 4.3    ABS. IMM. GRANS.     0.0   ABS. LYMPHOCYTES     2.0   ABS. LYMPHS (POC) 2.3 3.7 H 1.8 2.7    ABS. MONOCYTES     0.4   ABS. MONOS (POC) 0.1 0.5 0.6 0.4    ABS. EOSINOPHILS     0.1   ABS. BASOPHILS     0.0     Notes from referring Provider:  N/A    Other pertinent information:  N/A    Presented at Tumor Board:  N/A

## 2015-08-17 ENCOUNTER — Inpatient Hospital Stay: Admit: 2015-08-17 | Payer: PRIVATE HEALTH INSURANCE | Primary: Family Medicine

## 2015-08-17 ENCOUNTER — Ambulatory Visit
Admit: 2015-08-17 | Discharge: 2015-08-17 | Payer: PRIVATE HEALTH INSURANCE | Attending: Hematology & Oncology | Primary: Family Medicine

## 2015-08-17 DIAGNOSIS — D751 Secondary polycythemia: Secondary | ICD-10-CM

## 2015-08-17 LAB — CBC WITH AUTOMATED DIFF
ABS. BASOPHILS: 0.1 10*3/uL (ref 0.0–0.2)
ABS. EOSINOPHILS: 0.1 10*3/uL (ref 0.0–0.8)
ABS. LYMPHOCYTES: 1.9 10*3/uL (ref 0.5–4.6)
ABS. MONOCYTES: 0.8 10*3/uL (ref 0.1–1.3)
ABS. NEUTROPHILS: 4.2 10*3/uL (ref 1.7–8.2)
ABSOLUTE NRBC: 0.01 10*3/uL (ref 0.0–0.2)
BASOPHILS: 1 % (ref 0.0–2.0)
EOSINOPHILS: 2 % (ref 0.5–7.8)
HCT: 58 % — ABNORMAL HIGH (ref 41.1–50.3)
HGB: 20.3 g/dL — CR (ref 13.6–17.2)
LYMPHOCYTES: 27 % (ref 13–44)
MCH: 30.8 PG (ref 26.1–32.9)
MCHC: 35 g/dL (ref 31.4–35.0)
MCV: 87.9 FL (ref 79.6–97.8)
MONOCYTES: 11 % (ref 4.0–12.0)
MPV: 10 FL — ABNORMAL LOW (ref 10.8–14.1)
NEUTROPHILS: 60 % (ref 43–78)
PLATELET: 184 10*3/uL (ref 150–450)
RBC: 6.6 M/uL — ABNORMAL HIGH (ref 4.23–5.67)
RDW: 14.6 % (ref 11.9–14.6)
WBC: 7 10*3/uL (ref 4.3–11.1)

## 2015-08-17 LAB — METABOLIC PANEL, COMPREHENSIVE
A-G Ratio: 1.2 (ref 1.2–3.5)
ALT (SGPT): 55 U/L (ref 12–65)
AST (SGOT): 30 U/L (ref 15–37)
Albumin: 4.1 g/dL (ref 3.5–5.0)
Alk. phosphatase: 74 U/L (ref 50–136)
Anion gap: 9 mmol/L (ref 7–16)
BUN: 24 MG/DL — ABNORMAL HIGH (ref 6–23)
Bilirubin, total: 0.5 MG/DL (ref 0.2–1.1)
CO2: 21 mmol/L — ABNORMAL LOW (ref 23–32)
Calcium: 9.3 MG/DL (ref 8.3–10.4)
Chloride: 105 mmol/L (ref 98–107)
Creatinine: 1.43 MG/DL (ref 0.8–1.5)
GFR est AA: >60 mL/min/{1.73_m2} (ref >60)
GFR est non-AA: 54 mL/min/{1.73_m2} — ABNORMAL LOW (ref >60)
Globulin: 3.5 g/dL (ref 2.3–3.5)
Glucose: 92 mg/dL (ref 65–100)
Potassium: 4.6 mmol/L (ref 3.5–5.1)
Protein, total: 7.6 g/dL (ref 6.3–8.2)
Sodium: 135 mmol/L — ABNORMAL LOW (ref 136–145)

## 2015-08-17 NOTE — Progress Notes (Signed)
Critical H/H=20.3/58 called/read back and reported to pod.  Darlis LoanLois C Deisha Stull, RN

## 2015-08-17 NOTE — Patient Instructions (Addendum)
Patient Instructions From Your Nurse    Reason for Visit:  New patient appointment for polycythemia    Plan:  You will have lab work drawn today    Follow Up:  Follow up with Dr. Rose FillersQuddus in about 3 weeks    Recent Lab Results:      Care plan has been given to patient: n/a        -------------------------------------------------------------------------------------------------------------------  Please call our office at [phone] if you have any  of the following symptoms:   ?? Fever of 100.5 or greater  ?? Chills  ?? Shortness of breath  ?? Swelling or pain in one leg    After office hours an answering service is available and will contact a provider for emergencies or if you are experiencing any of the above symptoms.    ? Patient did express an interest in My Chart.  My Chart log in information explained on the after visit summary printout at the check-out desk.    Weyman CroonLindsay M Ryheem Jay, RN

## 2015-08-17 NOTE — Progress Notes (Signed)
Outpatient Consult Note    Data Source: Patient, [facility] record.    08/17/2015  10:42 AM      Steven [name] [id number]    59 y.o.    Patient Encounter: [facility] Note    Reason for consult:  *Polycythemia    HPI:  65 male, non-smoker, commercial sign business owner, h/o obesity, b/l knee arthroscopy (6/16), vit D deficiency, GERD, diverticulitis, allergic rhinitis, lower back pain related to herniated disc 's with plans to undergo laminectomy & fusion. Surgery was deferred related to insurance approval however was also found to have polycythemia, and as such referred to [place]. Review of labs reveals Hgb 18-19 range since 9/13. Hct 55-61 range. Other cell lines are normal. More recently has had some increase in his Cr 1.4-1.6. Recently seen by pulmonology, and diagnosed w moderate sleep apnea, and now on CPAP x 1 month, reports being regular w/ it as makes him sleep better. Notes chronic LE swelling x 3 yeas. Notes having had a detailed cardiac evaluation and was unremarkable.      Past Medical History   Diagnosis Date   ??? Allergic rhinitis, cause unspecified    ??? Borderline diabetes      03/2015 A1c 6.1  metformin daily- does not take BS at home   ??? Chronic left sacroiliac joint pain    ??? Chronic radicular low back pain          ??? Diverticulitis      resolved   ??? Generalized anxiety disorder    ??? GERD (gastroesophageal reflux disease)          ??? Hypercholesterolemia      pt denies---CCS 0 by CTA   ??? Hypertension      managed with med   ??? Left lumbar radiculitis      He has had low back pain that radiates down his LLE for years.   ??? Mood disorder ([place])      pt denies. no meds   ??? Obesity (BMI 30-39.9)      39.1   ??? OSA on CPAP    ??? Other malaise and fatigue      improved with cpap   ??? Venous insufficiency of both lower extremities      pt denies   ??? Vitamin D deficiency                Past Surgical History   Procedure Laterality Date   ??? Endoscopy, colon, diagnostic  2011     Diverticulosis    ??? Hx knee arthroscopy Bilateral 04/2015     Partial medial and lateral meniscectomies by Dr. Eliseo Squires   ??? Hx colonoscopy  05/2015     Diverticulosis         Current Outpatient Prescriptions   Medication Sig Dispense Refill   ??? topiramate (TOPAMAX) 50 mg tablet Take 50 mg by mouth every morning.     ??? testosterone cypionate (DEPOTESTOTERONE CYPIONATE) 200 mg/mL injection INJECT 1/2 MILLILITER EVERY 7 DAYS 2 mL 2   ??? metFORMIN (GLUCOPHAGE) 1,000 mg tablet TAKE ONE TABLET BY MOUTH TWICE A DAY 60 Tab 5   ??? VITAMIN D2 50,000 unit capsule TAKE 1 CAP BY MOUTH EVERY SEVEN (7) DAYS. INDICATIONS: VITAMIN D DEFICIENCY (Patient taking differently: TAKE 1 CAP BY MOUTH EVERY SEVEN (7) DAYS. INDICATIONS: VITAMIN D DEFICIENCY---every wednesday) 4 Cap 5   ??? Safety Needles 21 gauge x 1 1/2" ndle 1 Units by Does Not Apply route every seven (  7) days. 50 Units 5   ??? valsartan-hydrochlorothiazide (DIOVAN-HCT) 320-12.5 mg per tablet TAKE 1 TABLET BY MOUTH EVERY DAY (Patient taking differently: Take 1 Tab by mouth every morning. TAKE 1 TABLET BY MOUTH EVERY DAY) 30 Tab 5   ??? pravastatin (PRAVACHOL) 40 mg tablet TAKE 1 TABLET BY MOUTH AT BEDTIME 30 Tab 5   ??? metoprolol succinate (TOPROL-XL) 100 mg tablet Take 1 Tab by mouth nightly. Indications: HYPERTENSION 30 Tab 5   ??? aspirin (ASPIRIN LOW-STRENGTH) 81 mg chewable tablet Take 81 mg by mouth every morning. Indications: continue     ??? MULTI-VITAMIN HI-PO PO Take 2 Tabs by mouth every morning. Indications: hold until after surgery           Social History     Social History   ??? Marital status: MARRIED     Spouse name: N/A   ??? Number of children: N/A   ??? Years of education: N/A     Social History Main Topics   ??? Smoking status: Never Smoker   ??? Smokeless tobacco: Never Used   ??? Alcohol use 0.0 oz/week     0 Cans of beer per week      Comment: social   ??? Drug use: No   ??? Sexual activity: Not Asked     Other Topics Concern   ??? None     Social History Narrative         Family History    Problem Relation Age of Onset   ??? Diabetes Mother    ??? Alzheimer Mother    ??? Heart Disease Father      atherosclerosis (heavy smoker)   ??? Diabetes Sister    ??? Coronary Artery Disease Neg Hx    ??? Colon Cancer Neg Hx          REVIEW OF SYSTEMS:  As mentioned above, all other systems were reviewed in full and are negative.      Allergies   Allergen Reactions   ??? Nsaids (Non-Steroidal Anti-Inflammatory Drug) Other (comments)     Cause pedal edema if taken for a number of days in a row           PHYSICAL EXAMINATION:  General Appearance: Healthy appearing patient in no acute distress  Vitals were reviewed.  Visit Vitals   ??? BP 121/77   ??? Pulse 81   ??? Temp 98.1 ??F (36.7 ??C) (Oral)   ??? Resp 20   ??? Ht 5' 9"  (1.753 m)   ??? Wt 275 lb 8 oz (125 kg)   ??? SpO2 97%   ??? BMI 40.68 kg/m2     HEENT: No oral or pharyngeal masses, ulceration or thrush noted, no sinus tenderness.  Neck is supple with no thyromegaly or JVD noted.  Lymph Nodes: No lymphadenopathy noted in the occipital, pre and post auricular, cervical, supra and infraclavicular, axillary, epitrochlear, inguinal, and popliteal region.  Breasts: No palpable masses, nipple discharge or skin retraction  Lungs/Thorax: Clear to auscultation, no accessory muscles of respiration being used.  Heart: Regular rate and rhythm, normal S1, S2, no appreciable murmurs, rubs, gallops  Abdomen: Soft, nontender, bowel sounds present, no appreciable hepatosplenomegaly, no palpable masses  Extremeties: Good pulses bilaterally, no peripheral edema.  Skin: Normal skin tone with no rash, petechiae, ecchymosis noted.  Musculoskeletal: No pain on palpation over bony prominence, no edema, no evidence of gout, no joint or bony deformity  Neurologic: Grossly intact    LABS/IMAGING:    Lab  Results   Component Value Date/Time    WBC 6.3 07/25/2015 08:30 AM    HGB 19.9 07/25/2015 08:30 AM    HCT 58.0 07/25/2015 08:30 AM    PLATELET 167 07/25/2015 08:30 AM    MCV 92.4 07/25/2015 08:30 AM        Lab Results   Component Value Date/Time    SODIUM 141 07/25/2015 08:30 AM    POTASSIUM 4.5 07/25/2015 08:30 AM    CHLORIDE 104 07/25/2015 08:30 AM    CO2 28 07/25/2015 08:30 AM    ANION GAP 9 07/25/2015 08:30 AM    GLUCOSE 134 07/25/2015 08:30 AM    BUN 24 07/25/2015 08:30 AM    CREATININE 1.62 07/25/2015 08:30 AM    BUN/CREATININE RATIO 15 04/28/2015 08:51 AM    GFR EST AA 57 07/25/2015 08:30 AM    GFR EST NON-AA 47 07/25/2015 08:30 AM    CALCIUM 9.6 07/25/2015 08:30 AM    ALT 45 04/28/2015 08:51 AM    AST 37 04/28/2015 08:51 AM    ALK. PHOSPHATASE 63 04/28/2015 08:51 AM    PROTEIN, TOTAL 6.8 04/28/2015 08:51 AM    ALBUMIN 4.5 04/28/2015 08:51 AM    A-G RATIO 2.0 04/28/2015 08:51 AM           Above results reviewed with patient     ASSESSMENT:  48 male, non-smoker, commercial sign business owner, h/o obesity, b/l knee arthroscopy (6/16), vit D deficiency, GERD, diverticulitis, allergic rhinitis, lower back pain related to herniated disc 's with plans to undergo laminectomy & fusion. Surgery was deferred related to insurance approval however was also found to have polycythemia, and as such referred to [place]. Review of labs reveals Hgb 18-19 range since 9/13. Hct 55-61 range. Other cell lines are normal. More recently has had some increase in his Cr 1.4-1.6. Recently seen by pulmonology, and diagnosed w moderate sleep apnea, and now on CPAP x 1 month, reports being regular w/ it as makes him sleep better. Notes chronic LE swelling x 3 yeas. Notes having had a detailed cardiac evaluation and was unremarkable.     Polycythemia is likely secondary related to his OSA w and should improve w regular use of CPAP. On once daily baby aspirin (for multiple years). Will r/o primary marrow condition as follows however if secondary polycythemia, then no particular directed therapy will be needed unless Hct over 65%.     PLAN:  - Labs  - Peripheral blood smear  - Epo, JAK2 analysis  - continue daily aspirin   - increased creatinine: refer to nephro    RTC in 3 weeks w above.     Thank you Dr Raul Del & Dr Laveda Abbe for allowing [place] to paticipate in care of this pleasant patient. Please call w/ any quesions    I spent over 30 minutes with the patient out of which more than 22 were spent in face to face counseling.      Letha Cape, MD  [facility]  [facility]  [address]  [place] [address]  Office : [phone]  Fax : [phone]

## 2015-08-18 LAB — PATHOLOGIST REVIEW SMEARS

## 2015-08-18 LAB — ERYTHROPOIETIN: Erythropoietin: 35.3 m[IU]/mL — ABNORMAL HIGH (ref 2.6–18.5)

## 2015-08-19 NOTE — Progress Notes (Signed)
Faxed referral to [facility] received confirmation

## 2015-08-23 LAB — MISC. LAB TEST

## 2015-09-06 ENCOUNTER — Encounter

## 2015-09-07 ENCOUNTER — Ambulatory Visit
Admit: 2015-09-07 | Discharge: 2015-09-09 | Payer: PRIVATE HEALTH INSURANCE | Attending: Hematology & Oncology | Primary: Family Medicine

## 2015-09-07 ENCOUNTER — Inpatient Hospital Stay: Admit: 2015-09-07 | Payer: PRIVATE HEALTH INSURANCE | Primary: Family Medicine

## 2015-09-07 DIAGNOSIS — D751 Secondary polycythemia: Secondary | ICD-10-CM

## 2015-09-07 LAB — METABOLIC PANEL, COMPREHENSIVE
A-G Ratio: 1.2 (ref 1.2–3.5)
ALT (SGPT): 59 U/L (ref 12–65)
AST (SGOT): 47 U/L — ABNORMAL HIGH (ref 15–37)
Albumin: 4.1 g/dL (ref 3.5–5.0)
Alk. phosphatase: 73 U/L (ref 50–136)
Anion gap: 11 mmol/L (ref 7–16)
BUN: 19 MG/DL (ref 6–23)
Bilirubin, total: 0.6 MG/DL (ref 0.2–1.1)
CO2: 21 mmol/L — ABNORMAL LOW (ref 23–32)
Calcium: 9.7 MG/DL (ref 8.3–10.4)
Chloride: 104 mmol/L (ref 98–107)
Creatinine: 1.48 MG/DL (ref 0.8–1.5)
GFR est AA: >60 mL/min/{1.73_m2} (ref >60)
GFR est non-AA: 52 mL/min/{1.73_m2} — ABNORMAL LOW (ref >60)
Globulin: 3.3 g/dL (ref 2.3–3.5)
Glucose: 122 mg/dL — ABNORMAL HIGH (ref 65–100)
Potassium: 5.1 mmol/L (ref 3.5–5.1)
Protein, total: 7.4 g/dL (ref 6.3–8.2)
Sodium: 136 mmol/L (ref 136–145)

## 2015-09-07 LAB — CBC WITH AUTOMATED DIFF
ABS. BASOPHILS: 0.1 10*3/uL (ref 0.0–0.2)
ABS. EOSINOPHILS: 0.2 10*3/uL (ref 0.0–0.8)
ABS. LYMPHOCYTES: 2.2 10*3/uL (ref 0.5–4.6)
ABS. MONOCYTES: 0.7 10*3/uL (ref 0.1–1.3)
ABS. NEUTROPHILS: 4.4 10*3/uL (ref 1.7–8.2)
ABSOLUTE NRBC: 0 10*3/uL (ref 0.0–0.2)
BASOPHILS: 1 % (ref 0.0–2.0)
EOSINOPHILS: 2 % (ref 0.5–7.8)
HCT: 60.1 % — CR (ref 41.1–50.3)
HGB: 20.5 g/dL — CR (ref 13.6–17.2)
LYMPHOCYTES: 30 % (ref 13–44)
MCH: 30.3 PG (ref 26.1–32.9)
MCHC: 34.1 g/dL (ref 31.4–35.0)
MCV: 88.9 FL (ref 79.6–97.8)
MONOCYTES: 9 % (ref 4.0–12.0)
MPV: 10 FL — ABNORMAL LOW (ref 10.8–14.1)
NEUTROPHILS: 59 % (ref 43–78)
PLATELET: 159 10*3/uL (ref 150–450)
RBC: 6.76 M/uL — ABNORMAL HIGH (ref 4.23–5.67)
RDW: 15.1 % — ABNORMAL HIGH (ref 11.9–14.6)
WBC: 7.5 10*3/uL (ref 4.3–11.1)

## 2015-09-07 NOTE — Progress Notes (Signed)
Critical value reported to primary rn

## 2015-09-07 NOTE — Progress Notes (Signed)
Data Source: Patient, [facility] record.    09/07/2015    10:23 AM    Steven [name] [id number]    59 y.o.      Patient Encounter: [facility] Visit    Heme Diagnosis:  polycythemia  Heme History (Copied from prior):   25 male, non-smoker, commercial sign business owner, h/o obesity, b/l knee arthroscopy (6/16), vit D deficiency, GERD, diverticulitis, allergic rhinitis, lower back pain related to herniated disc 's with plans to undergo laminectomy & fusion. Surgery was deferred related to insurance approval however was also found to have polycythemia, and as such referred to [place]. Review of labs reveals Hgb 18-19 range since 9/13. Hct 55-61 range. Other cell lines are normal. More recently has had some increase in his Cr 1.4-1.6. Recently seen by pulmonology, and diagnosed w moderate sleep apnea, and now on CPAP x 1 month, reports being regular w/ it as makes him sleep better. Notes chronic LE swelling x 3 yeas. Notes having had a detailed cardiac evaluation and was unremarkable.   Interval History:  09/07/15: Hct stable around 60. Peripheral blood smear w normal morphology. Jak2 mutation analysis -ve. Epo high consistent w secondary polycythemia. Advised to continue CPAP and efforts directed at weight loss: Hct should improve over time. Continue daily aspirin. Hold phlebotomies unless hct increases over 65.   NCCN Distress Score:  -  REVIEW OF SYSTEMS:  As mentioned above, all other systems were reviewed in full and are negative.    Past Medical History   Diagnosis Date   ??? Allergic rhinitis, cause unspecified    ??? Borderline diabetes      03/2015 A1c 6.1  metformin daily- does not take BS at home   ??? Chronic left sacroiliac joint pain    ??? Chronic radicular low back pain          ??? Diverticulitis      resolved   ??? Generalized anxiety disorder    ??? GERD (gastroesophageal reflux disease)          ??? Hypercholesterolemia      pt denies---CCS 0 by CTA   ??? Hypertension      managed with med    ??? Left lumbar radiculitis      He has had low back pain that radiates down his LLE for years.   ??? Mood disorder ([place])      pt denies. no meds   ??? Obesity (BMI 30-39.9)      39.1   ??? OSA on CPAP    ??? Other malaise and fatigue      improved with cpap   ??? Venous insufficiency of both lower extremities      pt denies   ??? Vitamin D deficiency              Past Surgical History   Procedure Laterality Date   ??? Endoscopy, colon, diagnostic  2011     Diverticulosis   ??? Hx knee arthroscopy Bilateral 04/2015     Partial medial and lateral meniscectomies by Dr. Eliseo Squires   ??? Hx colonoscopy  05/2015     Diverticulosis       Current Outpatient Prescriptions   Medication Sig Dispense Refill   ??? topiramate (TOPAMAX) 50 mg tablet Take 50 mg by mouth every morning.     ??? testosterone cypionate (DEPOTESTOTERONE CYPIONATE) 200 mg/mL injection INJECT 1/2 MILLILITER EVERY 7 DAYS 2 mL 2   ??? metFORMIN (GLUCOPHAGE) 1,000 mg tablet TAKE ONE TABLET  BY MOUTH TWICE A DAY 60 Tab 5   ??? VITAMIN D2 50,000 unit capsule TAKE 1 CAP BY MOUTH EVERY SEVEN (7) DAYS. INDICATIONS: VITAMIN D DEFICIENCY (Patient taking differently: TAKE 1 CAP BY MOUTH EVERY SEVEN (7) DAYS. INDICATIONS: VITAMIN D DEFICIENCY---every wednesday) 4 Cap 5   ??? Safety Needles 21 gauge x 1 1/2" ndle 1 Units by Does Not Apply route every seven (7) days. 50 Units 5   ??? valsartan-hydrochlorothiazide (DIOVAN-HCT) 320-12.5 mg per tablet TAKE 1 TABLET BY MOUTH EVERY DAY (Patient taking differently: Take 1 Tab by mouth every morning. TAKE 1 TABLET BY MOUTH EVERY DAY) 30 Tab 5   ??? pravastatin (PRAVACHOL) 40 mg tablet TAKE 1 TABLET BY MOUTH AT BEDTIME 30 Tab 5   ??? metoprolol succinate (TOPROL-XL) 100 mg tablet Take 1 Tab by mouth nightly. Indications: HYPERTENSION 30 Tab 5   ??? aspirin (ASPIRIN LOW-STRENGTH) 81 mg chewable tablet Take 81 mg by mouth every morning. Indications: continue     ??? MULTI-VITAMIN HI-PO PO Take 2 Tabs by mouth every morning. Indications: hold until after surgery          Social History     Social History   ??? Marital status: MARRIED     Spouse name: N/A   ??? Number of children: N/A   ??? Years of education: N/A     Social History Main Topics   ??? Smoking status: Never Smoker   ??? Smokeless tobacco: Never Used   ??? Alcohol use 0.0 oz/week     0 Cans of beer per week      Comment: social   ??? Drug use: No   ??? Sexual activity: Not Asked     Other Topics Concern   ??? None     Social History Narrative       Family History   Problem Relation Age of Onset   ??? Diabetes Mother    ??? Alzheimer Mother    ??? Heart Disease Father      atherosclerosis (heavy smoker)   ??? Diabetes Sister    ??? Coronary Artery Disease Neg Hx    ??? Colon Cancer Neg Hx        Allergies   Allergen Reactions   ??? Nsaids (Non-Steroidal Anti-Inflammatory Drug) Other (comments)     Cause pedal edema if taken for a number of days in a row       PHYSICAL EXAMINATION:  General Appearance: Healthy appearing patient in no acute distress  Vitals reviewed.   Visit Vitals   ??? BP (!) 178/97  Comment: standing   ??? Pulse 73   ??? Temp 98.5 ??F (36.9 ??C) (Oral)   ??? Resp 22   ??? Ht $Remo'5\' 9"'bhZvM$  (1.753 m)   ??? Wt 279 lb 5 oz (126.7 kg)   ??? SpO2 95%   ??? BMI 41.25 kg/m2     HEENT: No oral or pharyngeal masses, ulceration or thrush noted, no sinus tenderness.  Neck is supple with no thyromegaly or JVD noted.  Lymph Nodes: No lymphadenopathy noted in the occipital, pre and post auricular, cervical, supra and infraclavicular, axillary, epitrochlear, inguinal, and popliteal region.  Breasts: No palpable masses, nipple discharge or skin retraction  Lungs/Thorax: Clear to auscultation, no accessory muscles of respiration being used.  Heart: Regular rate and rhythm, normal S1, S2, no appreciable murmurs, rubs, gallops  Abdomen: Soft, nontender, bowel sounds present, no appreciable hepatosplenomegaly, no palpable masses  Extremeties: Good pulses bilaterally, no peripheral edema.  Skin:  Normal skin tone with no rash, petechiae, ecchymosis noted.   Musculoskeletal: No pain on palpation over bony prominence, no edema, no evidence of gout, no joint or bony deformity  Neurologic: Grossly intact        LABS/IMAGING:    Lab Results   Component Value Date/Time    WBC 7.5 09/07/2015 09:26 AM    HGB 20.5 09/07/2015 09:26 AM    HCT 60.1 09/07/2015 09:26 AM    PLATELET 159 09/07/2015 09:26 AM    MCV 88.9 09/07/2015 09:26 AM       Lab Results   Component Value Date/Time    SODIUM 136 09/07/2015 09:26 AM    POTASSIUM 5.1 09/07/2015 09:26 AM    CHLORIDE 104 09/07/2015 09:26 AM    CO2 21 09/07/2015 09:26 AM    ANION GAP 11 09/07/2015 09:26 AM    GLUCOSE 122 09/07/2015 09:26 AM    BUN 19 09/07/2015 09:26 AM    CREATININE 1.48 09/07/2015 09:26 AM    BUN/CREATININE RATIO 15 04/28/2015 08:51 AM    GFR EST AA >60 09/07/2015 09:26 AM    GFR EST NON-AA 52 09/07/2015 09:26 AM    CALCIUM 9.7 09/07/2015 09:26 AM    ALT 59 09/07/2015 09:26 AM    AST 47 09/07/2015 09:26 AM    ALK. PHOSPHATASE 73 09/07/2015 09:26 AM    PROTEIN, TOTAL 7.4 09/07/2015 09:26 AM    ALBUMIN 4.1 09/07/2015 09:26 AM    GLOBULIN 3.3 09/07/2015 09:26 AM    A-G RATIO 1.2 09/07/2015 09:26 AM             Above results reviewed with patient.    ASSESSMENT:  21 male, non-smoker, commercial sign business owner, h/o obesity, b/l knee arthroscopy (6/16), vit D deficiency, GERD, diverticulitis, allergic rhinitis, lower back pain related to herniated disc 's with plans to undergo laminectomy & fusion. Surgery was deferred related to insurance approval however was also found to have polycythemia, and as such referred to [place]. Review of labs reveals Hgb 18-19 range since 9/13. Hct 55-61 range. Other cell lines are normal. More recently has had some increase in his Cr 1.4-1.6. Recently seen by pulmonology, and diagnosed w moderate sleep apnea, and now on CPAP x 1 month, reports being regular w/ it as makes him sleep better. Notes chronic LE swelling x 3 yeas. Notes having had a  detailed cardiac evaluation and was unremarkable.     Polycythemia is likely secondary related to his OSA w and should improve w regular use of CPAP. On once daily baby aspirin (for multiple years). Will r/o primary marrow condition as follows however if secondary polycythemia, then no particular directed therapy will be needed unless Hct over 65%.     09/07/15: Hct stable around 60. Peripheral blood smear w normal morphology. Jak2 mutation analysis -ve. Epo high consistent w secondary polycythemia. Advised to continue CPAP and efforts directed at weight loss: Hct should improve over time. Continue daily aspirin. Hold phlebotomies unless hct increases over 65. Surgery end December. Will get CBC 1 week prior. Otherwise RTC in 3 months.       PLAN:  - serial monitoring of hct: phlebotomy only if hct over 65%  - daily CPAP, f/u w pulmonology as needed  - continue daily aspirin  - increased creatinine: referred to nephro in past    CBC 1 week prior to surgery. RTC in 3 months time.     I spent over 20 minutes with the patient out  of which more than 12 were spent in face to face counseling.      Letha Cape, MD  [facility]  [facility]  [address] [place] [address]  Office : [phone]  Fax : [phone]

## 2015-09-07 NOTE — Patient Instructions (Signed)
Patient Instructions From Your Nurse    Reason for Visit:  Follow up for polycythemia     Plan:  All of the tests we did came back negative.  We believe that the polycythemia is secondary- which means it is not a bone marrow problem, it is caused by a different source. We believe that source is probably your sleep apnea.  It is very important that you use the CPAP regularly and hopefully your blood levels will regulate themselves.  Continue taking a baby aspirin daily.    Follow Up:  Follow up     Recent Lab Results:  Recent Results (from the past 12 hour(s))   CBC WITH AUTOMATED DIFF    Collection Time: 09/07/15  9:26 AM   Result Value Ref Range    WBC 7.5 4.3 - 11.1 K/uL    RBC 6.76 (H) 4.23 - 5.67 M/uL    HGB 20.5 (HH) 13.6 - 17.2 g/dL    HCT 60.1 (HH) 41.1 - 50.3 %    MCV 88.9 79.6 - 97.8 FL    MCH 30.3 26.1 - 32.9 PG    MCHC 34.1 31.4 - 35.0 g/dL    RDW 15.1 (H) 11.9 - 14.6 %    PLATELET 159 150 - 450 K/uL    MPV 10.0 (L) 10.8 - 14.1 FL    ABSOLUTE NRBC 0.00 0.0 - 0.2 K/uL    DF AUTOMATED      NEUTROPHILS 59 43 - 78 %    LYMPHOCYTES 30 13 - 44 %    MONOCYTES 9 4.0 - 12.0 %    EOSINOPHILS 2 0.5 - 7.8 %    BASOPHILS 1 0.0 - 2.0 %    ABS. NEUTROPHILS 4.4 1.7 - 8.2 K/UL    ABS. LYMPHOCYTES 2.2 0.5 - 4.6 K/UL    ABS. MONOCYTES 0.7 0.1 - 1.3 K/UL    ABS. EOSINOPHILS 0.2 0.0 - 0.8 K/UL    ABS. BASOPHILS 0.1 0.0 - 0.2 K/UL   METABOLIC PANEL, COMPREHENSIVE    Collection Time: 09/07/15  9:26 AM   Result Value Ref Range    Sodium 136 136 - 145 mmol/L    Potassium 5.1 3.5 - 5.1 mmol/L    Chloride 104 98 - 107 mmol/L    CO2 21 (L) 23 - 32 mmol/L    Anion gap 11 7 - 16 mmol/L    Glucose 122 (H) 65 - 100 mg/dL    BUN 19 6 - 23 MG/DL    Creatinine 1.48 0.8 - 1.5 MG/DL    GFR est AA >60 >60 ml/min/1.4m2    GFR est non-AA 52 (L) >60 ml/min/1.64m2    Calcium 9.7 8.3 - 10.4 MG/DL    Bilirubin, total 0.6 0.2 - 1.1 MG/DL    ALT 59 12 - 65 U/L    AST 47 (H) 15 - 37 U/L    Alk. phosphatase 73 50 - 136 U/L     Protein, total 7.4 6.3 - 8.2 g/dL    Albumin 4.1 3.5 - 5.0 g/dL    Globulin 3.3 2.3 - 3.5 g/dL    A-G Ratio 1.2 1.2 - 3.5         Care plan has been given to patient: n/a        -------------------------------------------------------------------------------------------------------------------  Please call our office at [phone] if you have any  of the following symptoms:   ?? Fever of 100.5 or greater  ?? Chills  ?? Shortness of breath  ?? Swelling  or pain in one leg    After office hours an answering service is available and will contact a provider for emergencies or if you are experiencing any of the above symptoms.    ? Patient did express an interest in My Chart.  My Chart log in information explained on the after visit summary printout at the [facility] desk.    Loel Lofty, RN

## 2015-09-29 MED ORDER — TESTOSTERONE CYPIONATE 200 MG/ML IM OIL
200 mg/mL | INTRAMUSCULAR | 0 refills | Status: DC
Start: 2015-09-29 — End: 2015-11-18

## 2015-09-29 NOTE — Telephone Encounter (Signed)
From: Steven DuboisMichael L Brauer  To: Allene DillonLindsey Caston Cecil, MD  Sent: 09/29/2015 10:36 AM EST  Subject:  Medication Renewal Request    Original  authorizing provider: Allene DillonLindsey Caston Cecil, MD    Steven DuboisMichael  L [name] would like a refill of the following medications:  testosterone  cypionate (DEPOTESTOTERONE CYPIONATE) 200 mg/mL injection [name][[name], MD]    Preferred  pharmacy: [id number] - [place], [place] - [address]    Comment:  I  will be out of town, but upon returning on 10-10-15, will be out of Testoterone and due for Injection. (I have Appt. Scheduled with Dr. Alfredo Bachecil on 10/25/15) All other Rx are good until then I beleive. Please [facility] Rx Ref[facility] [address] [place] [phone] Thank you, Kathlene NovemberMike

## 2015-09-30 NOTE — Telephone Encounter (Signed)
rx called in.

## 2015-10-12 ENCOUNTER — Inpatient Hospital Stay: Admit: 2015-10-12 | Payer: PRIVATE HEALTH INSURANCE | Primary: Family Medicine

## 2015-10-12 ENCOUNTER — Encounter: Primary: Family Medicine

## 2015-10-12 DIAGNOSIS — Z01818 Encounter for other preprocedural examination: Secondary | ICD-10-CM

## 2015-10-12 LAB — METABOLIC PANEL, BASIC
Anion gap: 6 mmol/L — ABNORMAL LOW (ref 7–16)
BUN: 17 MG/DL (ref 6–23)
CO2: 32 mmol/L (ref 21–32)
Calcium: 9.1 MG/DL (ref 8.3–10.4)
Chloride: 101 mmol/L (ref 98–107)
Creatinine: 1.3 MG/DL (ref 0.8–1.5)
GFR est AA: >60 mL/min/{1.73_m2} (ref >60)
GFR est non-AA: >60 mL/min/{1.73_m2} (ref >60)
Glucose: 88 mg/dL (ref 65–100)
Potassium: 4.3 mmol/L (ref 3.5–5.1)
Sodium: 139 mmol/L (ref 136–145)

## 2015-10-12 LAB — MSSA/MRSA SC BY PCR, NASAL SWAB

## 2015-10-12 LAB — CBC WITH AUTOMATED DIFF
ABS. BASOPHILS: 0 10*3/uL (ref 0.0–0.2)
ABS. EOSINOPHILS: 0.1 10*3/uL (ref 0.0–0.8)
ABS. IMM. GRANS.: 0 10*3/uL (ref 0.0–0.5)
ABS. LYMPHOCYTES: 2.1 10*3/uL (ref 0.5–4.6)
ABS. MONOCYTES: 0.5 10*3/uL (ref 0.1–1.3)
ABS. NEUTROPHILS: 3.9 10*3/uL (ref 1.7–8.2)
BASOPHILS: 1 % (ref 0.0–2.0)
EOSINOPHILS: 2 % (ref 0.5–7.8)
HCT: 57.7 % — ABNORMAL HIGH (ref 41.1–50.3)
HGB: 20 g/dL — ABNORMAL HIGH (ref 13.6–17.2)
IMMATURE GRANULOCYTES: 0.2 % (ref 0.0–5.0)
LYMPHOCYTES: 32 % (ref 13–44)
MCH: 31.5 PG (ref 26.1–32.9)
MCHC: 34.7 g/dL (ref 31.4–35.0)
MCV: 90.9 FL (ref 79.6–97.8)
MONOCYTES: 8 % (ref 4.0–12.0)
MPV: 10.5 FL — ABNORMAL LOW (ref 10.8–14.1)
NEUTROPHILS: 57 % (ref 43–78)
PLATELET: 161 10*3/uL (ref 150–450)
RBC: 6.35 M/uL — ABNORMAL HIGH (ref 4.23–5.67)
RDW: 14.1 % (ref 11.9–14.6)
WBC: 6.6 10*3/uL (ref 4.3–11.1)

## 2015-10-12 LAB — URINALYSIS W/ RFLX MICROSCOPIC
Bacteria: 0 /hpf
Bilirubin: NEGATIVE
Glucose: NEGATIVE mg/dL
Ketone: NEGATIVE mg/dL
Leukocyte Esterase: NEGATIVE
Nitrites: NEGATIVE
Protein: NEGATIVE mg/dL
Specific gravity: 1.018 (ref 1.001–1.023)
Urobilinogen: 0.2 EU/dL (ref 0.2–1.0)
pH (UA): 5 (ref 5.0–9.0)

## 2015-10-12 LAB — HEMOGLOBIN A1C WITH EAG
Est. average glucose: 126 mg/dL
Hemoglobin A1c: 6 % (ref 4.8–6.0)

## 2015-10-12 NOTE — Other (Addendum)
Patient verified name, DOB, and surgery as listed in [facility].    TYPE  CASE:3  Labs per surgeon: per protocol--cbc with diff, bmp, urine, ha1c, nasal swab-- all collected today and sent to lab  Labs per anesthesia protocol: type and screen dos  EKG  :  06/2015 in [facility] on chart  MRSA/MSSA:collected today  Pt instructed that they will be notified of positive results and will use mupirocin ointment as directed.       Patient provided with handouts including guide to surgery , transfusions, pain management and hand hygiene for the family and community. Pt verbalizes understanding of all pre-op instructions .  Instructed that family must be present in building at all times.    Hibiclens and instructions given per hospital policy.      Instructed patient to continue  previous medications as prescribed prior to surgery and  to take the following medications the day of surgery according to anesthesia guidelines : asa 81 mg       Continue all previous medications unless otherwise directed. Original medication prescription bottles was not visualized during patient appointment.      Instructed patient to hold  the following medications prior to surgery: multi vit  OF NOTE-- PT HAD A WALK IN ASSESS 07/25/15  FOR SAME SURG--SAW DR Beryle QuantHOUMANN DUE TO LENGTH OF SURG--- surg was cx due to problems with pt's insurance-- then pt was found to have elevated hgb/hct-- saw dr Larita Fifequaddus for this and worked up-- also started on a cpap at [facility]-- clear note from dr Larita Fifequaddus on chart-- dr Alphonzo Lemmingswhitney  in unit--reviewed note-- ok to see pt dos-- pt came over 1 hour early for p.a.t. appt due he got the time confused    Patient verbalized understanding of all instructions and provided all medical/health information to the best of their ability.     Road to Recovery Spine surgery patient guide given. Instructed on incentive spirometry with return demonstration. Long handled prehab sponge  given with instructions for use.  Patient viewed spine prehab video.

## 2015-10-12 NOTE — Other (Addendum)
todays labs reviewed--- had to call lab to get results of mssa nasal swab-- only had mrsa in [facility]-- spoke to whitney in lab--- states both are neg and she would enter the results--called pt to inform--left message on [phone]----also noted from todays labs-- hgb 20--- hct  57.7--- called dr Dorene [place] office spoke to leiann-- requested these be faxed as they arent in [facility]-- this done also made her aware of urine today with a trace of blood--- confirmation on chart--called dr Alphonzo Lemmingswhitney with these results ok for surg

## 2015-10-13 ENCOUNTER — Encounter: Primary: Family Medicine

## 2015-10-13 NOTE — Progress Notes (Signed)
Left VM with lab results. Hct stable. Told to call with any questions.

## 2015-10-14 ENCOUNTER — Encounter

## 2015-10-18 MED ORDER — CEFAZOLIN 3G IN 100 ML 0.9% NS PREMIX
3 gram/100ml | Freq: Once | INTRAVENOUS | Status: AC
Start: 2015-10-18 — End: 2015-10-19
  Administered 2015-10-19 – 2015-10-20 (×2): via INTRAVENOUS

## 2015-10-18 MED FILL — CEFAZOLIN 3G IN 100 ML 0.9% NS PREMIX: 3 gram/100ml | INTRAVENOUS | Qty: 100

## 2015-10-19 ENCOUNTER — Ambulatory Visit: Admit: 2015-10-19 | Payer: PRIVATE HEALTH INSURANCE | Primary: Family Medicine

## 2015-10-19 ENCOUNTER — Inpatient Hospital Stay
Admit: 2015-10-19 | Discharge: 2015-10-22 | Disposition: A | Discharge status: Home / Self Care | Payer: PRIVATE HEALTH INSURANCE | Attending: Neurological Surgery | Admitting: Neurological Surgery

## 2015-10-19 DIAGNOSIS — M4316 Spondylolisthesis, lumbar region: Secondary | ICD-10-CM

## 2015-10-19 LAB — TYPE AND SCREEN
ABO/Rh: O POS
Antibody Screen: NEGATIVE

## 2015-10-19 LAB — TYPE & SCREEN
ABO/Rh(D): O POS
Antibody screen: NEGATIVE

## 2015-10-19 LAB — GLUCOSE, POC: Glucose (POC): 103 mg/dL — ABNORMAL HIGH (ref 65–100)

## 2015-10-19 MED ORDER — CEFAZOLIN 1 GRAM SOLUTION FOR INJECTION
1 gram | INTRAMUSCULAR | Status: AC
Start: 2015-10-19 — End: ?

## 2015-10-19 MED ORDER — LACTATED RINGERS IV
INTRAVENOUS | Status: DC
Start: 2015-10-19 — End: 2015-10-19
  Administered 2015-10-19 (×2): via INTRAVENOUS

## 2015-10-19 MED ORDER — FAMOTIDINE (PF) 20 MG/2 ML IV
20 mg/2 mL | Freq: Once | INTRAVENOUS | Status: DC
Start: 2015-10-19 — End: 2015-10-19

## 2015-10-19 MED ORDER — LIDOCAINE-EPINEPHRINE 1 %-1:100,000 IJ SOLN
1 %-:00,000 | INTRAMUSCULAR | Status: AC
Start: 2015-10-19 — End: ?

## 2015-10-19 MED ORDER — PHENYLEPHRINE 10 MG/ML INJECTION
10 mg/mL | INTRAMUSCULAR | Status: DC | PRN
Start: 2015-10-19 — End: 2015-10-19
  Administered 2015-10-19 (×5): via INTRAVENOUS

## 2015-10-19 MED ORDER — MIDAZOLAM 1 MG/ML IJ SOLN
1 mg/mL | Freq: Once | INTRAMUSCULAR | Status: DC | PRN
Start: 2015-10-19 — End: 2015-10-19

## 2015-10-19 MED ORDER — SODIUM CHLORIDE 0.9 % IJ SYRG
Freq: Three times a day (TID) | INTRAMUSCULAR | Status: DC
Start: 2015-10-19 — End: 2015-10-19
  Administered 2015-10-20: 03:00:00 via INTRAVENOUS

## 2015-10-19 MED ORDER — LACTATED RINGERS BOLUS IV
Freq: Once | INTRAVENOUS | Status: DC
Start: 2015-10-19 — End: 2015-10-19

## 2015-10-19 MED ORDER — CEFAZOLIN 1 GRAM SOLUTION FOR INJECTION
1 gram | INTRAMUSCULAR | Status: DC | PRN
Start: 2015-10-19 — End: 2015-10-19
  Administered 2015-10-19: 23:00:00

## 2015-10-19 MED ORDER — THROMBIN (BOVINE) 5,000 UNIT TOPICAL SOLUTION
5000 unit | CUTANEOUS | Status: DC | PRN
Start: 2015-10-19 — End: 2015-10-19
  Administered 2015-10-19: 23:00:00 via TOPICAL

## 2015-10-19 MED ORDER — SODIUM CHLORIDE 0.9 % IJ SYRG
INTRAMUSCULAR | Status: DC | PRN
Start: 2015-10-19 — End: 2015-10-19

## 2015-10-19 MED ORDER — FENTANYL CITRATE (PF) 50 MCG/ML IJ SOLN
50 mcg/mL | INTRAMUSCULAR | Status: AC
Start: 2015-10-19 — End: ?

## 2015-10-19 MED ORDER — ROCURONIUM 10 MG/ML IV
10 mg/mL | INTRAVENOUS | Status: DC | PRN
Start: 2015-10-19 — End: 2015-10-19
  Administered 2015-10-19 – 2015-10-20 (×7): via INTRAVENOUS

## 2015-10-19 MED ORDER — THROMBIN (BOVINE) 5,000 UNIT TOPICAL SOLUTION
5000 unit | CUTANEOUS | Status: AC
Start: 2015-10-19 — End: ?

## 2015-10-19 MED ORDER — LIDOCAINE (PF) 20 MG/ML (2 %) IJ SOLN
20 mg/mL (2 %) | INTRAMUSCULAR | Status: DC | PRN
Start: 2015-10-19 — End: 2015-10-19
  Administered 2015-10-19: 21:00:00 via INTRAVENOUS

## 2015-10-19 MED ORDER — LIDOCAINE HCL 1 % (10 MG/ML) IJ SOLN
10 mg/mL (1 %) | INTRAMUSCULAR | Status: DC | PRN
Start: 2015-10-19 — End: 2015-10-19

## 2015-10-19 MED ORDER — FENTANYL CITRATE (PF) 50 MCG/ML IJ SOLN
50 mcg/mL | INTRAMUSCULAR | Status: DC | PRN
Start: 2015-10-19 — End: 2015-10-19

## 2015-10-19 MED ORDER — PROPOFOL 10 MG/ML IV EMUL
10 mg/mL | INTRAVENOUS | Status: DC | PRN
Start: 2015-10-19 — End: 2015-10-19
  Administered 2015-10-19: 21:00:00 via INTRAVENOUS

## 2015-10-19 MED ORDER — FENTANYL CITRATE (PF) 50 MCG/ML IJ SOLN
50 mcg/mL | INTRAMUSCULAR | Status: DC | PRN
Start: 2015-10-19 — End: 2015-10-19
  Administered 2015-10-19 – 2015-10-20 (×5): via INTRAVENOUS

## 2015-10-19 MED ORDER — FAMOTIDINE 20 MG TAB
20 mg | Freq: Once | ORAL | Status: AC
Start: 2015-10-19 — End: 2015-10-19
  Administered 2015-10-19: 20:00:00 via ORAL

## 2015-10-19 MED ORDER — LACTATED RINGERS IV
INTRAVENOUS | Status: DC | PRN
Start: 2015-10-19 — End: 2015-10-19
  Administered 2015-10-19 – 2015-10-20 (×2): via INTRAVENOUS

## 2015-10-19 MED FILL — LIDOCAINE-EPINEPHRINE 1 %-1:100,000 IJ SOLN: 1 %-:00,000 | INTRAMUSCULAR | Qty: 20

## 2015-10-19 MED FILL — FAMOTIDINE 20 MG TAB: 20 mg | ORAL | Qty: 1

## 2015-10-19 MED FILL — THROMBIN-JMI 5,000 UNIT TOPICAL SOLUTION: 5000 unit | CUTANEOUS | Qty: 2

## 2015-10-19 MED FILL — CEFAZOLIN 1 GRAM SOLUTION FOR INJECTION: 1 gram | INTRAMUSCULAR | Qty: 1000

## 2015-10-19 MED FILL — FENTANYL CITRATE (PF) 50 MCG/ML IJ SOLN: 50 mcg/mL | INTRAMUSCULAR | Qty: 4

## 2015-10-19 NOTE — Other (Signed)
Family called with update.  Family gave code needed for information.

## 2015-10-19 NOTE — Other (Signed)
TRANSFER - OUT REPORT:    Verbal report given to [employer] RN on Steven [name]  being transferred to 727 for routine post - op       Report consisted of patient???s Situation, Background, Assessment and   Recommendations(SBAR).     Information from the following report(s) SBAR, OR Summary, Procedure Summary, Intake/Output and MAR was reviewed with the receiving nurse.    Lines:   Peripheral IV 10/19/15 Right Hand (Active)   Site Assessment Clean, dry, & intact 10/19/2015 10:29 PM   Phlebitis Assessment 0 10/19/2015 10:29 PM   Infiltration Assessment 0 10/19/2015 10:29 PM   Dressing Status Clean, dry, & intact;Occlusive 10/19/2015 10:29 PM   Dressing Type Transparent;Tape 10/19/2015 10:29 PM   Hub Color/Line Status Green;Infusing 10/19/2015 10:29 PM   Alcohol Cap Used No 10/19/2015 10:29 PM       Peripheral IV 10/19/15 Left Hand (Active)   Site Assessment Clean, dry, & intact 10/19/2015 10:29 PM   Phlebitis Assessment 0 10/19/2015 10:29 PM   Infiltration Assessment 0 10/19/2015 10:29 PM   Dressing Status Clean, dry, & intact;Occlusive 10/19/2015 10:29 PM   Dressing Type Transparent;Tape 10/19/2015 10:29 PM   Hub Color/Line Status Green;Capped 10/19/2015 10:29 PM   Alcohol Cap Used No 10/19/2015 10:29 PM        Opportunity for questions and clarification was provided.      Patient transported with:   O2 @ 3 liters    VTE prophylaxis orders have been written for Steven [name].    Patient given room number and nurses name.  Family updated re: pt status after security code verified.

## 2015-10-19 NOTE — Progress Notes (Signed)
Primary Nurse Gearldine BienenstockJames A Wilson, RN and B Bright RN performed a dual skin assessment on this patient Impairment noted- see wound doc flow sheet. See below.  -2 surgical incisions noted to back.

## 2015-10-19 NOTE — Anesthesia Post-Procedure Evaluation (Signed)
Post-Anesthesia Evaluation and Assessment    Patient: Steven DuboisMichael L Scarpulla MRN: [id number]  SSN: [id number]    Date of Birth: 1956-04-05  Age: 59 y.o.  Sex: male       Cardiovascular Function/Vital Signs  Visit Vitals   ??? BP 169/87   ??? Pulse 92   ??? Temp 37.1 ??C (98.8 ??F)   ??? Resp 18   ??? Ht 5\' 10"  (1.778 m)   ??? Wt 128.4 kg (283 lb)   ??? SpO2 94%   ??? BMI 40.61 kg/m2       Patient is status post general anesthesia for Procedure(s):  LEFT L4-5 SPINE TRANSFORAMINAL LUMBAR INTERBODY FUSION (TLIF) WITH SEXTANT /BILATERAL L3-4, L4-5, L5-S1 DECOMPRESSION.    Nausea/Vomiting: None    Postoperative hydration reviewed and adequate.    Pain:  Pain Scale 1: Visual (10/19/15 2229)  Pain Intensity 1: 0 (10/19/15 2229)   Managed    Neurological Status:   Neuro (WDL): Within Defined Limits (10/19/15 2229)  Neuro  LUE Motor Response: Purposeful (10/19/15 2229)  LLE Motor Response: Purposeful (10/19/15 2229)  RUE Motor Response: Purposeful (10/19/15 2229)  RLE Motor Response: Purposeful (10/19/15 2229)   At baseline    Mental Status and Level of Consciousness: Arousable    Pulmonary Status:   O2 Device: Nasal cannula (10/19/15 2229)   Adequate oxygenation and airway patent    Complications related to anesthesia: None    Post-anesthesia assessment completed. No concerns. [name] is currently doing well. Given history of OSA and Polycythemia I have ordered CPAP and continuous pulse oximetry overnight. He is currently AOx3, hemodynamically stable, oxygenating and ventilating well, and able to see and repeat visual cue of three fingers. Will continue to monitor in PACU until he meets criteria for transfer to the floor.    Signed By: Barbaraann FasterMatthew B Reeya Bound, MD     October 19, 2015

## 2015-10-19 NOTE — Anesthesia Pre-Procedure Evaluation (Signed)
Anesthetic History   No history of anesthetic complications            Review of Systems / Medical History  Patient summary reviewed, nursing notes reviewed and pertinent labs reviewed    Pulmonary        Sleep apnea: CPAP           Neuro/Psych              Cardiovascular    Hypertension: well controlled              Exercise tolerance: >4 METS     GI/Hepatic/Renal     GERD: well controlled           Endo/Other    Diabetes: type 2    Obesity     Other Findings   Comments: Polycythemia           Physical Exam    Airway  Mallampati: II  TM Distance: 4 - 6 cm  Neck ROM: normal range of motion   Mouth opening: Diminished (comment)     Cardiovascular    Rhythm: regular           Dental         Pulmonary  Breath sounds clear to auscultation               Abdominal         Other Findings            Anesthetic Plan    ASA: 3  Anesthesia type: general          Induction: Intravenous  Anesthetic plan and risks discussed with: Patient

## 2015-10-19 NOTE — Progress Notes (Signed)
Patient arrived on floor.

## 2015-10-19 NOTE — Progress Notes (Signed)
TRANSFER - IN REPORT:    Verbal report received from John RN on Steven DuboisMichael L [name]  being received from PACU for routine post - op      Report consisted of patient???s Situation, Background, Assessment and   Recommendations(SBAR).     Information from the following report(s) SBAR, Kardex, OR Summary, Procedure Summary and Recent Results was reviewed with the receiving nurse.    Opportunity for questions and clarification was provided.      Assessment completed upon patient???s arrival to unit and care assumed.

## 2015-10-20 LAB — GLUCOSE, POC
Glucose (POC): 132 mg/dL — ABNORMAL HIGH (ref 65–100)
Glucose (POC): 122 mg/dL — ABNORMAL HIGH (ref 65–100)
Glucose (POC): 123 mg/dL — ABNORMAL HIGH (ref 65–100)

## 2015-10-20 LAB — CREATININE: Creatinine: 1.68 MG/DL — ABNORMAL HIGH (ref 0.8–1.5)

## 2015-10-20 MED ORDER — SENNOSIDES-DOCUSATE SODIUM 8.6 MG-50 MG TAB
Freq: Every day | ORAL | Status: DC
Start: 2015-10-20 — End: 2015-10-22
  Administered 2015-10-21: 14:00:00 via ORAL

## 2015-10-20 MED ORDER — GLYCOPYRROLATE 0.2 MG/ML IJ SOLN
0.2 mg/mL | INTRAMUSCULAR | Status: DC | PRN
Start: 2015-10-20 — End: 2015-10-19
  Administered 2015-10-20: 03:00:00 via INTRAVENOUS

## 2015-10-20 MED ORDER — LACTATED RINGERS IV
INTRAVENOUS | Status: DC
Start: 2015-10-20 — End: 2015-10-19
  Administered 2015-10-20: 04:00:00 via INTRAVENOUS

## 2015-10-20 MED ORDER — ONDANSETRON (PF) 4 MG/2 ML INJECTION
4 mg/2 mL | Freq: Once | INTRAMUSCULAR | Status: DC
Start: 2015-10-20 — End: 2015-10-19
  Administered 2015-10-20: 04:00:00 via INTRAVENOUS

## 2015-10-20 MED ORDER — ASPIRIN 81 MG CHEWABLE TAB
81 mg | ORAL | Status: DC
Start: 2015-10-20 — End: 2015-10-22
  Administered 2015-10-20 – 2015-10-22 (×5): via ORAL

## 2015-10-20 MED ORDER — OXYCODONE 5 MG TAB
5 mg | Freq: Once | ORAL | Status: DC | PRN
Start: 2015-10-20 — End: 2015-10-19

## 2015-10-20 MED ORDER — CEFAZOLIN 1 GRAM SOLUTION FOR INJECTION
1 gram | INTRAMUSCULAR | Status: AC
Start: 2015-10-20 — End: ?

## 2015-10-20 MED ORDER — NALOXONE 0.4 MG/ML INJECTION
0.4 mg/mL | INTRAMUSCULAR | Status: DC | PRN
Start: 2015-10-20 — End: 2015-10-22

## 2015-10-20 MED ORDER — HYDROCHLOROTHIAZIDE 12.5 MG CAP
12.5 mg | Freq: Every day | ORAL | Status: DC
Start: 2015-10-20 — End: 2015-10-22
  Administered 2015-10-20 – 2015-10-22 (×3): via ORAL

## 2015-10-20 MED ORDER — MORPHINE 10 MG/ML INJ SOLUTION
10 mg/ml | INTRAMUSCULAR | Status: DC | PRN
Start: 2015-10-20 — End: 2015-10-22
  Administered 2015-10-20 – 2015-10-21 (×4): via INTRAMUSCULAR

## 2015-10-20 MED ORDER — INSULIN LISPRO 100 UNIT/ML INJECTION
100 unit/mL | Freq: Four times a day (QID) | SUBCUTANEOUS | Status: DC
Start: 2015-10-20 — End: 2015-10-22
  Administered 2015-10-21: 14:00:00 via SUBCUTANEOUS

## 2015-10-20 MED ORDER — DIPHENHYDRAMINE HCL 50 MG/ML IJ SOLN
50 mg/mL | Freq: Once | INTRAMUSCULAR | Status: DC
Start: 2015-10-20 — End: 2015-10-19
  Administered 2015-10-20: 04:00:00 via INTRAVENOUS

## 2015-10-20 MED ORDER — 1/2 NS WITH POTASSIUM CHLORIDE 20 MEQ/L IV
20 mEq/L | INTRAVENOUS | Status: DC
Start: 2015-10-20 — End: 2015-10-22
  Administered 2015-10-20 (×2): via INTRAVENOUS

## 2015-10-20 MED ORDER — ONDANSETRON (PF) 4 MG/2 ML INJECTION
4 mg/2 mL | INTRAMUSCULAR | Status: DC | PRN
Start: 2015-10-20 — End: 2015-10-19
  Administered 2015-10-20: 03:00:00 via INTRAVENOUS

## 2015-10-20 MED ORDER — ACETAMINOPHEN 325 MG TABLET
325 mg | ORAL | Status: DC | PRN
Start: 2015-10-20 — End: 2015-10-22

## 2015-10-20 MED ORDER — OXYCODONE-ACETAMINOPHEN 5 MG-325 MG TAB
5-325 mg | ORAL | Status: DC | PRN
Start: 2015-10-20 — End: 2015-10-21
  Administered 2015-10-20 – 2015-10-21 (×7): via ORAL

## 2015-10-20 MED ORDER — ACETAMINOPHEN 500 MG TAB
500 mg | Freq: Once | ORAL | Status: DC
Start: 2015-10-20 — End: 2015-10-19
  Administered 2015-10-20: 04:00:00 via ORAL

## 2015-10-20 MED ORDER — SODIUM CHLORIDE 0.9 % IJ SYRG
INTRAMUSCULAR | Status: DC | PRN
Start: 2015-10-20 — End: 2015-10-22

## 2015-10-20 MED ORDER — .PHARMACY TO SUBSTITUTE PER PROTOCOL
Status: DC | PRN
Start: 2015-10-20 — End: 2015-10-19

## 2015-10-20 MED ORDER — PRAVASTATIN 20 MG TAB
20 mg | Freq: Every evening | ORAL | Status: DC
Start: 2015-10-20 — End: 2015-10-22
  Administered 2015-10-20 – 2015-10-22 (×3): via ORAL

## 2015-10-20 MED ORDER — SODIUM CHLORIDE 0.9 % IJ SYRG
INTRAMUSCULAR | Status: DC | PRN
Start: 2015-10-20 — End: 2015-10-19

## 2015-10-20 MED ORDER — METFORMIN 500 MG TAB
500 mg | Freq: Two times a day (BID) | ORAL | Status: DC
Start: 2015-10-20 — End: 2015-10-22
  Administered 2015-10-21 – 2015-10-22 (×2): via ORAL

## 2015-10-20 MED ORDER — SODIUM CHLORIDE 0.9 % IJ SYRG
Freq: Three times a day (TID) | INTRAMUSCULAR | Status: DC
Start: 2015-10-20 — End: 2015-10-22
  Administered 2015-10-20 – 2015-10-22 (×8): via INTRAVENOUS

## 2015-10-20 MED ORDER — ACETAMINOPHEN 1,000 MG/100 ML (10 MG/ML) IV
1000 mg/100 mL (10 mg/mL) | INTRAVENOUS | Status: AC
Start: 2015-10-20 — End: ?

## 2015-10-20 MED ORDER — ONDANSETRON (PF) 4 MG/2 ML INJECTION
4 mg/2 mL | INTRAMUSCULAR | Status: DC | PRN
Start: 2015-10-20 — End: 2015-10-22

## 2015-10-20 MED ORDER — LIDOCAINE-EPINEPHRINE 1 %-1:100,000 IJ SOLN
1 %-:00,000 | INTRAMUSCULAR | Status: DC | PRN
Start: 2015-10-20 — End: 2015-10-19
  Administered 2015-10-20: 03:00:00 via SUBCUTANEOUS

## 2015-10-20 MED ORDER — CLONIDINE 0.1 MG TAB
0.1 mg | ORAL | Status: DC | PRN
Start: 2015-10-20 — End: 2015-10-22
  Administered 2015-10-20: 21:00:00 via ORAL

## 2015-10-20 MED ORDER — HYDROMORPHONE (PF) 2 MG/ML IJ SOLN
2 mg/mL | INTRAMUSCULAR | Status: DC | PRN
Start: 2015-10-20 — End: 2015-10-19
  Administered 2015-10-20 (×2): via INTRAVENOUS

## 2015-10-20 MED ORDER — NEOSTIGMINE METHYLSULFATE 1 MG/ML INJECTION
1 mg/mL | INTRAMUSCULAR | Status: DC | PRN
Start: 2015-10-20 — End: 2015-10-19
  Administered 2015-10-20: 03:00:00 via INTRAVENOUS

## 2015-10-20 MED ORDER — FENTANYL CITRATE (PF) 50 MCG/ML IJ SOLN
50 mcg/mL | INTRAMUSCULAR | Status: AC
Start: 2015-10-20 — End: ?

## 2015-10-20 MED ORDER — HEPARIN (PORCINE) 5,000 UNIT/ML IJ SOLN
5000 unit/mL | Freq: Two times a day (BID) | INTRAMUSCULAR | Status: DC
Start: 2015-10-20 — End: 2015-10-22
  Administered 2015-10-20 – 2015-10-22 (×5): via SUBCUTANEOUS

## 2015-10-20 MED ORDER — METOPROLOL SUCCINATE SR 100 MG 24 HR TAB
100 mg | Freq: Every evening | ORAL | Status: DC
Start: 2015-10-20 — End: 2015-10-22
  Administered 2015-10-20 – 2015-10-22 (×3): via ORAL

## 2015-10-20 MED ORDER — NALOXONE 0.4 MG/ML INJECTION
0.4 mg/mL | INTRAMUSCULAR | Status: DC | PRN
Start: 2015-10-20 — End: 2015-10-19

## 2015-10-20 MED FILL — ROCURONIUM 10 MG/ML IV: 10 mg/mL | INTRAVENOUS | Qty: 160

## 2015-10-20 MED FILL — PRAVASTATIN 20 MG TAB: 20 mg | ORAL | Qty: 2

## 2015-10-20 MED FILL — HYDROMORPHONE (PF) 2 MG/ML IJ SOLN: 2 mg/mL | INTRAMUSCULAR | Qty: 1

## 2015-10-20 MED FILL — METOPROLOL SUCCINATE SR 100 MG 24 HR TAB: 100 mg | ORAL | Qty: 1

## 2015-10-20 MED FILL — PHENYLEPHRINE 10 MG/ML INJECTION: 10 mg/mL | INTRAMUSCULAR | Qty: 600

## 2015-10-20 MED FILL — GLYCOPYRROLATE 0.2 MG/ML IJ SOLN: 0.2 mg/mL | INTRAMUSCULAR | Qty: 0.6

## 2015-10-20 MED FILL — MORPHINE 10 MG/ML IJ SOLN: 10 mg/mL | INTRAMUSCULAR | Qty: 1

## 2015-10-20 MED FILL — OXYCODONE-ACETAMINOPHEN 5 MG-325 MG TAB: 5-325 mg | ORAL | Qty: 2

## 2015-10-20 MED FILL — XYLOCAINE-MPF 20 MG/ML (2 %) INJECTION SOLUTION: 20 mg/mL (2 %) | INTRAMUSCULAR | Qty: 60

## 2015-10-20 MED FILL — HEPARIN (PORCINE) 5,000 UNIT/ML IJ SOLN: 5000 unit/mL | INTRAMUSCULAR | Qty: 1

## 2015-10-20 MED FILL — LACTATED RINGERS IV: INTRAVENOUS | Qty: 2000

## 2015-10-20 MED FILL — VALSARTAN 320 MG TAB: 320 mg | ORAL | Qty: 1

## 2015-10-20 MED FILL — ONDANSETRON (PF) 4 MG/2 ML INJECTION: 4 mg/2 mL | INTRAMUSCULAR | Qty: 4

## 2015-10-20 MED FILL — 1/2 NS WITH POTASSIUM CHLORIDE 20 MEQ/L IV: 20 mEq/L | INTRAVENOUS | Qty: 1000

## 2015-10-20 MED FILL — NEOSTIGMINE METHYLSULFATE 3 MG/3 ML (1 MG/ML) IV SYRINGE: 3 mg/ mL (1 mg/mL) | INTRAVENOUS | Qty: 3

## 2015-10-20 MED FILL — FENTANYL CITRATE (PF) 50 MCG/ML IJ SOLN: 50 mcg/mL | INTRAMUSCULAR | Qty: 2

## 2015-10-20 MED FILL — SENNA PLUS 8.6 MG-50 MG TABLET: ORAL | Qty: 2

## 2015-10-20 MED FILL — CEFAZOLIN 1 GRAM SOLUTION FOR INJECTION: 1 gram | INTRAMUSCULAR | Qty: 1000

## 2015-10-20 MED FILL — OFIRMEV 1,000 MG/100 ML (10 MG/ML) INTRAVENOUS SOLUTION: 1000 mg/100 mL (10 mg/mL) | INTRAVENOUS | Qty: 100

## 2015-10-20 MED FILL — ASPIRIN 81 MG CHEWABLE TAB: 81 mg | ORAL | Qty: 1

## 2015-10-20 MED FILL — PROPOFOL 10 MG/ML IV EMUL: 10 mg/mL | INTRAVENOUS | Qty: 200

## 2015-10-20 MED FILL — CLONIDINE 0.1 MG TAB: 0.1 mg | ORAL | Qty: 1

## 2015-10-20 NOTE — Progress Notes (Signed)
Physical Therapy Note:    Attempted PT treatment for patient, he c/o 9/10 pain just below his neck and radiating down through his back. He has received morphine for it, RN notified that he continues to have pain. Will hold PT treatment this PM due to severe pain.    Thank you,  Rhys MartiniSarah W Calixto Pavel, DPT

## 2015-10-20 NOTE — Progress Notes (Signed)
Placed pt on a continuous oximetry and helped him with his CPAP.

## 2015-10-20 NOTE — Progress Notes (Signed)
Care Management Interventions  Mode of Transport at Discharge: Other (see comment)  Discharge Durable Medical Equipment: Yes (fixed wheel rolling Hailea Eaglin from [facility])  Physical Therapy Consult: Yes  Current Support Network: Own Home, Lives with Spouse  Confirm Follow Up Transport: Self  Discharge Location  Discharge Placement: Home with family assistance    SW was notified that the pt needs a fixed wheel rolling Keishana Klinger at discharge.  Order received and forwarded to [facility] for delivery to the pt's room either this afternoon or tomorrow morning.  No other discharge needs or concerns identified or reported.  SW remains available to assist as needed.

## 2015-10-20 NOTE — Progress Notes (Signed)
Pt doing well, no complaints.  Eating, Foley in, beginning to mobilize.    AFVSS  Neuro intact  Wounds OK    Stable, remove Foley and mobilize.

## 2015-10-20 NOTE — Progress Notes (Signed)
Problem: Mobility Impaired (Adult and Pediatric)  Goal: *Acute Goals and Plan of Care (Insert Text)  STG:  (1.)Mr. Rivenbark will move from supine to sit and sit to supine , scoot up and down and roll side to side with SUPERVISION within 3 day(s).   (2.)Mr. Garin will transfer from bed to chair and chair to bed with SUPERVISION using the least restrictive device within 3 day(s).   (3.)Mr. Bracco will ambulate with SUPERVISION for 250 feet with the least restrictive device within 3 day(s).   (4.)Mr. Longnecker will maintain spinal precautions throughout all functional mobility within 3 days with 0 verbal cues.  (5.)Mr. Follansbee will don/doff spinal brace with 0 cues within 3 days to improve safety with functional mobility.     LTG:  (1.)Mr. Robideau will move from supine to sit and sit to supine , scoot up and down and roll side to side in bed with MODIFIED INDEPENDENCE within 7 day(s).   (2.)Mr. Ostergaard will transfer from bed to chair and chair to bed with MODIFIED INDEPENDENCE using the least restrictive device within 7 day(s).   (3.)Mr. Llerena will ambulate with MODIFIED INDEPENDENCE for 500 feet with the least restrictive device within 7 day(s).  (4.)Mr. Morren will ascend and descend 4 stairs using L hand rail(s) with MODIFIED INDEPENDENCE to improve functional mobility and safety within 7 day(s).    ________________________________________________________________________________________________  PHYSICAL THERAPY: INITIAL ASSESSMENT, DAILY NOTE AND AM  INPATIENT: Hospital Day: 2  Payor: [employer] / Plan: [employer]EE PLAN / Product Type: PPO /      NAME/AGE/GENDER: Barton DuboisMichael L Baltz is a 59 y.o. 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 is a 59 y.o. male s/p above surgery. He lives with wife in two story home on first floor and has 5 steps to enter. He is typically independent with all mobility. He reports LLE radicular pain has improved. He required verbal cues and additional time for mobility, able to transfer to sitting with stand by assistance and cues, stood with CGA and ambulated in hallway with CGA, rolling walker 250'. Balance improved with distance to SBA. Noted that once he sat up on the side of the bed, he had breakthrough bleeding, RN notified. He was educated on ADLs while maintaining spinal precautions and required some assistance to don socks and shorts. Reported shoulder/between the scapula pain, educated on shoulder shrugs and pinches to reduce pain. Some reduction in pain noted, physician was also informed. He performed standing balance activities and was left set up at the sink with CNA and RN aware to check on him soon. He would like a rolling walker upon d/c as he feels much more secure with it. Barton DuboisMichael L Ascher is currently functioning below his baseline and would benefit from skilled PT during acute care stay to maximize safety and independence with functional mobility.       ????????This section established at most recent assessment??????????  PROBLEM LIST (Impairments causing functional limitations):  1. Decreased Strength  2. Decreased ADL/Functional Activities  3. Decreased Transfer Abilities  4. Decreased Ambulation Ability/Technique  5. Decreased Balance  6. Increased Pain  7. Decreased Knowledge of Precautions  8. Decreased Independence with Home Exercise Program  REHABILITATION POTENTIAL FOR STATED GOALS: EXCELLENT      PLAN OF CARE:   INTERVENTIONS PLANNED: (Benefits and precautions of physical therapy have  been discussed with the patient.)  1. balance exercise  2. bed mobility  3. family education  4. gait training  5. group therapy  6. home exercise program (HEP)  7. therapeutic activities  8. therapeutic exercise/strengthening  9. transfer training  10. Patient education  FREQUENCY/DURATION: Follow patient BID to address above goals.    v  RECOMMENDED REHABILITATION/EQUIPMENT: (at time of discharge pending progress):   Continue Skilled Therapy and Adaptive Equipment: Walkers, Type: Rolling Walker.  SUBJECTIVE:   "I am not sore at the incision anymore, just between my shoulders."     v  Present Symptoms:   Active Ambulatory Problems     Diagnosis Date Noted   ??? BMI 40.0-44.9, adult (HCC)     ??? Generalized anxiety disorder     ??? Other malaise and fatigue     ??? Testicular hypofunction     ??? Allergic rhinitis, cause unspecified     ??? Pre-diabetes     ??? Essential hypertension, benign     ??? Pure hypercholesterolemia 07/07/2012   ??? Left lumbar radiculitis     ??? Vitamin D deficiency     ??? Mood disorder (HCC) 07/14/2014   ??? Venous insufficiency of both lower extremities 07/14/2014   ??? 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 OSA (obstructive sleep apnea) 06/30/2015       Resolved Ambulatory Problems     Diagnosis Date Noted   ??? No Resolved Ambulatory Problems       Past Medical History   Diagnosis Date   ??? Borderline diabetes     ??? Diverticulitis     ??? GERD (gastroesophageal reflux disease)     ??? Hypercholesterolemia     ??? Hypertension     ??? Morbid obesity (HCC)     ??? OSA on CPAP     ??? Polycythemia (HCC) dx--06/2015     Past Surgical History   Procedure Laterality Date   ??? Endoscopy, colon, diagnostic   2011       Diverticulosis   ??? Hx knee arthroscopy Bilateral 04/2015       Partial medial and lateral meniscectomies by Dr. Benjamine Mola   ??? Hx colonoscopy   05/2015       Diverticulosis        Pain Intensity 1: 5 (pre and post therapy)  Pain Location 1: Back   Pain Orientation 1: Lower  Pain Intervention(s) 1: Ambulation/Increased Activity  History of Present Injury/Illness:   Active Ambulatory Problems     Diagnosis Date Noted   ??? BMI 40.0-44.9, adult (HCC)     ??? Generalized anxiety disorder     ??? Other malaise and fatigue     ??? Testicular hypofunction     ??? Allergic rhinitis, cause unspecified     ??? Pre-diabetes     ??? Essential hypertension, benign     ??? Pure hypercholesterolemia 07/07/2012   ??? Left lumbar radiculitis     ??? Vitamin D deficiency     ??? Mood disorder (HCC) 07/14/2014   ??? Venous insufficiency of both lower extremities 07/14/2014   ??? 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 Chronic  radicular low back pain 09/11/2014   ??? OSA (obstructive sleep apnea) 06/30/2015       Resolved Ambulatory Problems     Diagnosis Date Noted   ??? No Resolved Ambulatory Problems       Past Medical History   Diagnosis Date   ??? Borderline diabetes     ??? Diverticulitis     ??? GERD (gastroesophageal reflux disease)     ??? Hypercholesterolemia     ??? Hypertension     ??? Morbid obesity (HCC)     ??? OSA on CPAP     ??? Polycythemia (HCC) dx--06/2015     Past Surgical History   Procedure Laterality Date   ??? Endoscopy, colon, diagnostic   2011       Diverticulosis   ??? Hx knee arthroscopy Bilateral 04/2015       Partial medial and lateral meniscectomies by Dr. Benjamine Mola   ??? Hx colonoscopy   05/2015       Diverticulosis     Social History       Social History   ??? Marital status: MARRIED       Spouse name: N/A   ??? Number of children: N/A   ??? Years of education: N/A       Occupational History   ??? Not on file.       Social History Main Topics   ??? Smoking status: Never Smoker   ??? Smokeless tobacco: Never Used   ??? Alcohol use 0.0 oz/week       0 Cans of beer per week         Comment: social   ??? Drug use: No   ??? Sexual activity: Not on file       Other Topics Concern   ??? Not on file       Social History Narrative     Prior Level of Function/Home Situation: independent with mobility   Home Environment: Private residence  # Steps to Enter: 5  Rails to Enter: Yes  Hand Rails : Left  One/Two Story Residence: One story  Living Alone: No  Support Systems: Games developer  Patient Expects to be Discharged to:: Private residence  Current DME Used/Available at Home: None  Tub or Shower Type: Shower  OBJECTIVE/TREATMENT:                                       [employer]??? ???6 Clicks???                                          Basic Mobility Inpatient Short Form  How much difficulty does the patient currently have... Unable A Lot A Little None   1.  Turning over in bed (including adjusting bedclothes, sheets and blankets)?   [ ]  1   [ ]  2   [X]  3   [ ]  4   2.  Sitting down on and standing up from a chair with arms ( e.g., wheelchair, bedside commode, etc.)   [ ]  1   [ ]  2   [X]  3   [ ]  4   3.  Moving from lying on back to sitting on the side of the bed?   [ ]  1   [ ]  2   [X]  3   [ ]   4               How much help from another person does the patient currently need... Total A Lot A Little None   4.  Moving to and from a bed to a chair (including a wheelchair)?    1    2    3    4   5.  Need to walk in hospital room?    1    2    3    4   6.  Climbing 3-5 steps with a railing?    1    2    3    4   ?? 2007, Trustees of [address], under license to [place] [place]. All rights reserved       Score:  Initial: 18 Most Recent: X (Date: -- )   Interpretation of Tool:  Represents activities that are increasingly more difficult (i.e. Bed mobility, Transfers, Gait).  Score 24 23 22-20 19-15 14-10 9-7 6   Modifier CH CI CJ CK CL CM CN       ?? Mobility - Walking and Moving Around:              [id number] - CURRENT STATUS:    CK - 40%-59% impaired, limited or restricted              [id number] - GOAL STATUS:           CI - 1%-19% impaired, limited or restricted              [id number] - D/C STATUS:              ---------------To be determined---------------    Most Recent Physical Functioning:   Gross Assessment:  Strength: Generally decreased, functional  Coordination: Generally decreased, functional  Sensation: Intact               Posture:  Posture (WDL): Exceptions to WDL  Posture Assessment: Forward head, Rounded shoulders  Balance:     Bed Mobility:  Rolling: Stand-by asssistance  Supine to Sit: Stand-by asssistance  Scooting: Stand-by asssistance  Wheelchair Mobility:     Transfers:  Sit to Stand: Contact guard assistance  Stand to Sit: Contact guard assistance  Bed to Chair: Contact guard assistance  Gait:     Base of Support: Center of gravity altered;Widened  Speed/Cadence: Slow  Step Length: Right shortened;Left shortened  Swing Pattern: Right symmetrical;Left symmetrical  Stance: Right increased;Left increased  Gait Abnormalities: Decreased step clearance  Distance (ft): 250 Feet (ft)  Assistive Device: Walker, rolling;Brace/Splint  Interventions: Safety awareness training;Manual cues;Verbal cues;Visual/Demos      (In addition to Assessment/Re-Assessment sessions the following treatments were rendered)   Therapeutic Activity: (    30 minutes):  Therapeutic activities including Bed transfers, Chair transfers, Ambulation on level ground, standing balance activities, education on log roll technique, maintaining spinal precautions throughout all mobility to improve mobility, strength, balance and coordination.  Required minimal Safety awareness training;Manual cues;Verbal cues;Visual/Demos to promote static and dynamic balance in standing.      Braces/Orthotics/Lines/Etc:   ?? O2 Room Air  ?? Spinal brace  Safety:   After treatment position/precautions:  ?? Up in chair  ?? Bed/Chair-wheels locked  ?? Bed in low position  ?? RN notified  ?? CNA and RN notified to check on him soon, as call light was not within reach at the sink  Progression/Medical Necessity:   ?? Patient demonstrates excellent rehab potential due to higher previous functional level.   Compliance with Program/Exercises: Will assess as treatment progresses.   Reason for Continuation of Services/Other Comments:  ?? Patient continues to require modification of therapeutic interventions to increase complexity of exercises.  Recommendations/Intent for next treatment session: Treatment next visit will focus on advancements to more challenging activities and reduction in assistance provided.  Total Treatment Duration:  Time In: 0912  Time Out: 1005  Rhys Martini, DPT

## 2015-10-21 LAB — GLUCOSE, POC
Glucose (POC): 117 mg/dL — ABNORMAL HIGH (ref 65–100)
Glucose (POC): 165 mg/dL — ABNORMAL HIGH (ref 65–100)
Glucose (POC): 122 mg/dL — ABNORMAL HIGH (ref 65–100)
Glucose (POC): 125 mg/dL — ABNORMAL HIGH (ref 65–100)

## 2015-10-21 MED ORDER — OXYCODONE-ACETAMINOPHEN 10 MG-325 MG TAB
10-325 mg | ORAL | Status: DC | PRN
Start: 2015-10-21 — End: 2015-10-22
  Administered 2015-10-21 – 2015-10-22 (×7): via ORAL

## 2015-10-21 MED FILL — OXYCODONE-ACETAMINOPHEN 5 MG-325 MG TAB: 5-325 mg | ORAL | Qty: 2

## 2015-10-21 MED FILL — METOPROLOL SUCCINATE SR 100 MG 24 HR TAB: 100 mg | ORAL | Qty: 1

## 2015-10-21 MED FILL — ASPIRIN 81 MG CHEWABLE TAB: 81 mg | ORAL | Qty: 1

## 2015-10-21 MED FILL — OXYCODONE-ACETAMINOPHEN 10 MG-325 MG TAB: 10-325 mg | ORAL | Qty: 2

## 2015-10-21 MED FILL — HEPARIN (PORCINE) 5,000 UNIT/ML IJ SOLN: 5000 unit/mL | INTRAMUSCULAR | Qty: 1

## 2015-10-21 MED FILL — MORPHINE 10 MG/ML IJ SOLN: 10 mg/mL | INTRAMUSCULAR | Qty: 1

## 2015-10-21 MED FILL — METFORMIN 500 MG TAB: 500 mg | ORAL | Qty: 2

## 2015-10-21 MED FILL — SENNA PLUS 8.6 MG-50 MG TABLET: ORAL | Qty: 2

## 2015-10-21 MED FILL — PRAVASTATIN 20 MG TAB: 20 mg | ORAL | Qty: 2

## 2015-10-21 MED FILL — VALSARTAN 320 MG TAB: 320 mg | ORAL | Qty: 1

## 2015-10-21 NOTE — Progress Notes (Signed)
PT Daily Note  Attempted to see patient for physical therapy this afternoon but patient reports having just returned to bed. Encouraged patient to ambulate in hallways this afternoon with wife. Patient verbalizes understanding. Will check back on patient tomorrow.  Thank you,  Anderson MaltaJessica M Binkley-Vance, PTA

## 2015-10-21 NOTE — Progress Notes (Signed)
Pt doing well, no complaints.  Eating, voiding, slowly mobilizing.  Pain control marginal.    AFVSS  Neuro intact  Wounds OK    Stable, will adjust pain meds and cont to mobilize.  Plan home in am.

## 2015-10-21 NOTE — Progress Notes (Signed)
Problem: Mobility Impaired (Adult and Pediatric)  Goal: *Acute Goals and Plan of Care (Insert Text)  STG:  (1.)Steven [name] will move from supine to sit and sit to supine , scoot up and down and roll side to side with SUPERVISION within 3 day(s).   (2.)Steven [name] will transfer from bed to chair and chair to bed with SUPERVISION using the least restrictive device within 3 day(s).   (3.)Steven [name] will ambulate with SUPERVISION for 250 feet with the least restrictive device within 3 day(s).   (4.)Steven [name] will maintain spinal precautions throughout all functional mobility within 3 days with 0 verbal cues.  (5.)Steven [name] will don/doff spinal brace with 0 cues within 3 days to improve safety with functional mobility.     LTG:  (1.)Steven [name] will move from supine to sit and sit to supine , scoot up and down and roll side to side in bed with MODIFIED INDEPENDENCE within 7 day(s).   (2.)Steven [name] will transfer from bed to chair and chair to bed with MODIFIED INDEPENDENCE using the least restrictive device within 7 day(s).   (3.)Steven [name] will ambulate with MODIFIED INDEPENDENCE for 500 feet with the least restrictive device within 7 day(s).  (4.)Steven [name] will ascend and descend 4 stairs using L hand rail(s) with MODIFIED INDEPENDENCE to improve functional mobility and safety within 7 day(s).    ______________________________________________________________________________________________  PHYSICAL THERAPY: Daily Note, Treatment Day: 1st and AM  INPATIENT: Hospital Day: 3  Payor: [employer] / Plan: [employer] / Product Type: PPO /      NAME/AGE/GENDER: Steven [name] is a 59 y.o. 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 male s/p above surgery. Patient was supine upon contact and agreeable to PT. Reports 5/10 pain before and after treatment. Patient was medicated prior to treatment. Patient able to perform bed mobility and transfers with SBA and cues for proper technique. Dons lumbar corset with supervision and cues for proper placement. Patient able to ambulate 250' with use of rolling walker, SBA-supervision and no LOB noted. Patient reports he may have over done it yesterday morning and declines increased gait distance. Encouraged patient to change his position often throughout the day and ambulate in the hallways with wife. Patient verbalizes understanding. Overall good progress towards physical therapy goals. Will continue efforts.    ????????This section established at most recent assessment??????????  PROBLEM LIST (Impairments causing functional limitations):  1. Decreased Strength  2. Decreased ADL/Functional Activities  3. Decreased Transfer Abilities  4. Decreased Ambulation Ability/Technique  5. Decreased Balance  6. Increased Pain  7. Decreased Knowledge of Precautions  8. Decreased Independence with Home Exercise Program  REHABILITATION POTENTIAL FOR STATED GOALS: EXCELLENT      PLAN OF CARE:   INTERVENTIONS PLANNED: (Benefits and precautions of physical therapy have been discussed with the patient.)  1. balance exercise  2. bed mobility  3. family education  4. gait training  5. group therapy  6. home exercise program (HEP)  7. therapeutic activities  8. therapeutic exercise/strengthening  9.  transfer training  10. Patient education  FREQUENCY/DURATION: Follow patient BID to address above goals.    v  RECOMMENDED REHABILITATION/EQUIPMENT: (at time of discharge pending progress):    Continue Skilled Therapy and Adaptive Equipment: Walkers, Type: Rolling Walker.  SUBJECTIVE:   "I'm feeling better"     v  Present Symptoms:   Active Ambulatory Problems     Diagnosis Date Noted   ??? BMI 40.0-44.9, adult (HCC)    ??? Generalized anxiety disorder    ??? Other malaise and fatigue    ??? Testicular hypofunction    ??? Allergic rhinitis, cause unspecified    ??? Pre-diabetes    ??? Essential hypertension, benign    ??? Pure hypercholesterolemia 07/07/2012   ??? Left lumbar radiculitis    ??? Vitamin D deficiency    ??? Mood disorder (HCC) 07/14/2014   ??? Venous insufficiency of both lower extremities 07/14/2014   ??? 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 OSA (obstructive sleep apnea) 06/30/2015     Resolved Ambulatory Problems     Diagnosis Date Noted   ??? No Resolved Ambulatory Problems     Past Medical History   Diagnosis Date   ??? Borderline diabetes    ??? Diverticulitis    ??? GERD (gastroesophageal reflux disease)    ??? Hypercholesterolemia    ??? Hypertension    ??? Morbid obesity (HCC)    ??? OSA on CPAP    ??? Polycythemia (HCC) dx--06/2015     Past Surgical History   Procedure Laterality Date   ??? Endoscopy, colon, diagnostic  2011     Diverticulosis   ??? Hx knee arthroscopy Bilateral 04/2015     Partial medial and lateral meniscectomies by Dr. Benjamine Mola   ??? Hx colonoscopy  05/2015     Diverticulosis        Pain Intensity 1: 5 (before and after treatment)  Pain Intervention(s) 1: Ambulation/Increased Activity, Repositioned  History of Present Injury/Illness:   Active Ambulatory Problems     Diagnosis Date Noted   ??? BMI 40.0-44.9, adult (HCC)    ??? Generalized anxiety disorder    ??? Other malaise and fatigue    ??? Testicular hypofunction    ??? Allergic rhinitis, cause unspecified    ??? Pre-diabetes    ??? Essential hypertension, benign    ??? Pure hypercholesterolemia 07/07/2012   ??? Left lumbar radiculitis    ??? Vitamin D deficiency    ??? Mood disorder (HCC) 07/14/2014    ??? Venous insufficiency of both lower extremities 07/14/2014   ??? 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 OSA (obstructive sleep apnea) 06/30/2015     Resolved Ambulatory Problems     Diagnosis Date Noted   ??? No Resolved Ambulatory Problems     Past Medical History   Diagnosis Date   ??? Borderline diabetes    ??? Diverticulitis    ??? GERD (gastroesophageal reflux disease)    ??? Hypercholesterolemia    ??? Hypertension    ??? Morbid obesity (HCC)    ??? OSA on CPAP    ??? Polycythemia (HCC) dx--06/2015     Past Surgical History   Procedure Laterality Date   ??? Endoscopy, colon, diagnostic  2011     Diverticulosis   ??? Hx knee arthroscopy Bilateral 04/2015     Partial medial and lateral meniscectomies by Dr. Benjamine Mola   ??? Hx  colonoscopy  05/2015     Diverticulosis     Social History     Social History   ??? Marital status: MARRIED     Spouse name: N/A   ??? Number of children: N/A   ??? Years of education: N/A     Occupational History   ??? Not on file.     Social History Main Topics   ??? Smoking status: Never Smoker   ??? Smokeless tobacco: Never Used   ??? Alcohol use 0.0 oz/week     0 Cans of beer per week      Comment: social   ??? Drug use: No   ??? Sexual activity: Not on file     Other Topics Concern   ??? Not on file     Social History Narrative     Prior Level of Function/Home Situation: independent with mobility  Home Environment: Private residence  # Steps to Enter: 5  Rails to Enter: Yes  Hand Rails : Left  One/Two Story Residence: One story  Living Alone: No  Support Systems: Games developerpouse/Significant Other/Partner  Patient Expects to be Discharged to:: Private residence  Current DME Used/Available at Home: None  Tub or Shower Type: Shower  OBJECTIVE/TREATMENT:                                       [employer] AM[employer]??? ???6 Clicks???                                          Basic Mobility Inpatient Short Form  How much difficulty does the patient currently have... Unable A Lot A Little None    1.  Turning over in bed (including adjusting bedclothes, sheets and blankets)?   [ ]  1   [ ]  2   [X]  3   [ ]  4   2.  Sitting down on and standing up from a chair with arms ( e.g., wheelchair, bedside commode, etc.)   [ ]  1   [ ]  2   [X]  3   [ ]  4   3.  Moving from lying on back to sitting on the side of the bed?   [ ]  1   [ ]  2   [X]  3   [ ]  4               How much help from another person does the patient currently need... Total A Lot A Little None   4.  Moving to and from a bed to a chair (including a wheelchair)?   [ ]  1   [ ]  2   [X]  3   [ ]  4   5.  Need to walk in hospital room?   [ ]  1   [ ]  2   [X]  3   [ ]  4   6.  Climbing 3-5 steps with a railing?   [ ]  1   [ ]  2   [X]  3   [ ]  4   ?? 2007, Trustees of [employer], under license to [employer]. All rights reserved       Score:  Initial: 18 Most Recent: X (Date: -- )   Interpretation of Tool:  Represents activities that are increasingly more difficult (i.e. Bed mobility, Transfers, Gait).  Score 24 23 22-20 19-15 14-10 9-7 6   Modifier CH CI CJ CK CL CM CN       ?? Mobility - Walking and Moving Around:              [id number] - CURRENT STATUS:    CK - 40%-59% impaired, limited or restricted              [id number] - GOAL STATUS:           CI - 1%-19% impaired, limited or restricted              [id number] - D/C STATUS:              ---------------To be determined---------------   Most Recent Physical Functioning:   Gross Assessment:                  Posture:  Posture (WDL): Exceptions to WDL  Posture Assessment: Forward head, Rounded shoulders  Balance:  Sitting: Intact  Standing: Impaired  Standing - Static: Good  Standing - Dynamic : Fair  Bed Mobility:  Supine to Sit: Stand-by asssistance  Wheelchair Mobility:     Transfers:  Sit to Stand: Stand-by asssistance  Stand to Sit: Stand-by asssistance  Gait:     Base of Support: Center of gravity altered;Widened  Speed/Cadence: Shuffled;Slow  Step Length: Left shortened;Right shortened   Gait Abnormalities: Decreased step clearance;Trunk sway increased  Distance (ft): 250 Feet (ft)  Assistive Device: Walker, rolling;Brace/Splint  Interventions: Safety awareness training;Tactile cues;Verbal cues        Therapeutic Activity: (    16 minutes):  Therapeutic activities including Bed mobility training, transfer training, static/dynamic standing balance activities,  Ambulation on level ground, and patient education on post-op spinal precautions/log rolling and brace to improve mobility, strength, balance and coordination.  Required minimal Safety awareness training;Tactile cues;Verbal cues to promote static and dynamic balance in standing, promote coordination of bilateral, lower extremity(s) and to promote improved ability to maintain spinal precautions throughout functional movements.      Braces/Orthotics/Lines/Etc:   ?? O2 Room Air  ?? Spinal brace  Safety:   After treatment position/precautions:  ?? Up in chair, Bed/Chair-wheels locked, Bed in low position and RN notified     Progression/Medical Necessity:   ?? Patient demonstrates excellent rehab potential due to higher previous functional level.  Compliance with Program/Exercises: Will assess as treatment progresses.   Reason for Continuation of Services/Other Comments:  ?? Patient continues to require modification of therapeutic interventions to increase complexity of exercises.  Recommendations/Intent for next treatment session: Treatment next visit will focus on advancements to more challenging activities and reduction in assistance provided.  Total Treatment Duration:  Time In: 1018  Time Out: 1034  Anderson Malta Binkley-Vance, PTA

## 2015-10-22 LAB — GLUCOSE, POC
Glucose (POC): 96 mg/dL (ref 65–100)
Glucose (POC): 125 mg/dL — ABNORMAL HIGH (ref 65–100)

## 2015-10-22 MED FILL — ASPIRIN 81 MG CHEWABLE TAB: 81 mg | ORAL | Qty: 1

## 2015-10-22 MED FILL — PRAVASTATIN 20 MG TAB: 20 mg | ORAL | Qty: 2

## 2015-10-22 MED FILL — SENNA PLUS 8.6 MG-50 MG TABLET: ORAL | Qty: 2

## 2015-10-22 MED FILL — OXYCODONE-ACETAMINOPHEN 10 MG-325 MG TAB: 10-325 mg | ORAL | Qty: 2

## 2015-10-22 MED FILL — VALSARTAN 320 MG TAB: 320 mg | ORAL | Qty: 1

## 2015-10-22 MED FILL — METOPROLOL SUCCINATE SR 100 MG 24 HR TAB: 100 mg | ORAL | Qty: 1

## 2015-10-22 MED FILL — METFORMIN 500 MG TAB: 500 mg | ORAL | Qty: 2

## 2015-10-22 MED FILL — HEPARIN (PORCINE) 5,000 UNIT/ML IJ SOLN: 5000 unit/mL | INTRAMUSCULAR | Qty: 1

## 2015-10-22 NOTE — Op Note (Signed)
Steven [name] [id number]  [id number]    July 01, 1956  59 y.o.  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 Left L4-L5 far lateral approach to total facetectomy, partial hemilaminectomy, and foraminotomy for decompression of nerves.  2. Transforaminal lumbar interbody fusion at L4-L5 with local autograft, BMP, and interbody cage (Elevate, Medtronic).  3. Harvest of local autograft.  4. Sextant (Medtronic) posterior pedicle screw instrumentation, L4-L5 left unilateral.  5. Microsurgery.  6. Intraoperative fluoroscopic guidance.  7. Right L4-L5 and bilateral L3-4 and L5-S1 partial hemilaminectomies, medial facetectomies, and foraminotomies for decompression of nerves.    Procedure in Detail:   After appropriate informed consent was obtained, the patient was taken to the operating suite where satisfactory general anesthesia was induced. The patient was then turned into the prone position on the operating table on chest rolls. All pressure points were carefully padded. Perioperative antibiotics were given. The lumbar region was shaved, prepped and draped in the usual sterile fashion. Intraoperative fluoroscopy was utilized to localize the L4-L5 level. The skin was infiltrated with 1% lidocaine with epinephrine.     Next, a 26 mm vertical linear incision was made 4.5 cm to the left of the midline directly over the L4-L5 level. Dissection was carried down through the subcutaneous tissue. The fascia was perforated using the K-wire. The sequential dilators were passed over the K-wire and were docked on the left L4-L5 facet under fluoroscopic guidance. A tubular retractor was passed over the dilators. This was also  docked on the facet under fluoroscopic guidance. The retractor was secured firmly in place using the articulating arm. The dilators were removed from the wound and the operating microscope was brought into the field.    A small amount of soft tissue was removed from the left L4-L5 facet. The Midas-Rex drill and Kerrison rongeur were used to perform a total facetectomy and partial hemilaminectomy for decompression of nerves. Bone was harvested during this part of the procedure for use as local autograft later in the case. The Kerrison rongeur was used to remove the underlying ligamentum flavum with care being given to protection of the nerve roots and the thecal sac. The left L4 and L5 nerve roots were both identified and were mobilized using microsurgical technique.  Epidural veins were coagulated and sharply divided. The pedicles were identified both superiorly and inferiorly. The L4-L5 disc space was identified and was incised sharply using the METRx knife. Disc material was removed from the disc space in a piecemeal fashion using the pituitary rongeurs. The nerves were inspected and were found to be completely decompressed.    Next, the sequential distractors were inserted into the disc space under fluoroscopic guidance. Additional disc material was removed. The endplates were carefully prepared using reversed angle curettes and pituitary rongeurs. Once the disc space had been completely cleaned of disc material and cartilaginous endplate, local autograft harvested earlier in the case was packed into the disc space far to the right side.  A BMP sponge which had been prepared at the beginning of the case was also packed into the disc space with the autograft.  Next, an Elevate cage was brought up to the field and soaked in antibiotic solution.  The center of the cage was packed with another BMP sponge.  The cage was carefully tapped into the disc space under fluoroscopic guidance.  The cage was expanded  appropriately.  Intraoperative fluoroscopy confirmed excellent cage placement. Next, the operating microscope was removed from the field. The tubular retractor was removed from the wound.    Intraoperative fluoroscopy was used to localize the L4-L5 level. The skin was infiltrated with 1% lidocaine with epinephrine. A 16 mm vertical linear incision was then made directly over the L4-L5 level 1.5 cm to the right of midline. Dissection was carried down through the subcutaneous tissue. The fascia was perforated using a K wire. The sequential dilators were passed over the K wire and were docked on the L4-L5 laminae under fluoroscopic guidance. A tubular retractor was passed over the dilators and was also docked on the L4-L5 laminae under fluoroscopic guidance. The retractor was secured firmly in place using the articulating arm. The dilators were removed from the wound. The operating microscope was brought into the field.      Next, a small amount of soft tissue was removed from the right L4-L5 laminae. The Midas-Rex high speed drill and Kerrison rongeur were used to perform a right L4-L5 partial hemilaminectomy.  A medial facetectomy was also carried out.  The thickened ligament was removed with care being given to protection of the underlying neural elements. The right L5 nerve root was identified and was gently mobilized using microsurgical technique.  A generous foraminotomy was carried out over the right L5 nerve root. The nerves were then inspected and were found to be completely free. The wound was then irrigated copiously with antibiotic solution. Meticulous hemostasis was obtained. The operating microscope was removed from the field. The tubular retractor was removed from the wound. The fascia was closed using a single 0 Vicryl suture. The subcutaneous tissue was closed in a layered fashion.  The exact same procedure was then carried out bilaterally at L3-4 and L5-S1 with the same findings and results.      Intraoperative fluoroscopy was used to localize the left L4 and L5 pedicles. Jam-Shidi needles were driven down both pedicles under fluoroscopic guidance using the existing incision. K-wires were passed down the needle cannulas which were then removed over the K-wires. The pedicles were tapped percutaneously under fluoroscopic guidance. Sextant pedicle screws were then passed over the K-wires and were driven down the pedicles under fluoroscopic guidance. The K-wires were removed from the wounds. Intraoperative fluoroscopy confirmed appropriate screw placement.     Next, the screw extenders were hooked together in an appropriate fashion. The Sextant device was attached to the screw extenders. A small stab incision was made in the left flank. A Sextant trocar was passed into the subcutaneous tissue and was driven down to the pedicle screw heads. Measurements were taken for the appropriate length of rod. The trocar was removed from the wound. A Sextant rod was loaded onto the Sextant device and was passed through both screw heads appropriately. Intraoperative fluoroscopy confirmed appropriate positioning of the rod in both screw heads.  The set screws were tightened down firmly. The heads were broken off. The screw extenders and the Sextant device were removed from the wound.     Next, the wounds were all irrigated out copiously with antibiotic solution. The fascia was closed using interrupted 0 Vicryl sutures. The subcutaneous tissue was closed in a layered fashion.  The skin edges were approximated using Dermabond. Sterile dressings were applied to each wound. The patient was then turned back into the supine position on the stretcher where satisfactory anesthesia was reversed.    The patient was then taken to the recovery room in satisfactory condition.    Estimated Blood Loss:   75cc    Implants:   Implant Name Type Inv. Item Serial No. Manufacturer Lot No. LRB No. Used Action   GRAFT BNE RECOMB HUM INFUSE XS --   - [id number]  GRAFT BNE RECOMB HUM INFUSE XS --   [employer] [facility] DANEK [id number] N/A 1 Implanted   expandable interbody device,extra lordotic l=28 h=11 w=10     [id number] N/A 1 Implanted   SET SCR NS SXT BRKOFF 4.75 TI --  - [id number]  SET SCR NS SXT BRKOFF 4.75 TI --   [employer] [facility] DANEK [id number] N/A 2 Implanted   ROD SEXTANT 4.75 CCM --  - [id number]  ROD SEXTANT 4.75 CCM --   [employer] [facility] DANEK [id number] N/A 1 Implanted   SCR CANN AXIIAL 6.5X55MM TI --  - [id number]   SCR CANN AXIIAL 6.5X55MM TI --    [employer] [id number] N/A 2 Implanted               Specimens: None        Drains: None                Complications: None    Counts: Sponge and needle counts were correct times two.    Signed By:  Darl Householder, MD     October 22, 2015

## 2015-10-22 NOTE — Progress Notes (Signed)
NS  POD#3  AFEBRILE  DOING WELL  5/5 POWER  DRY DRESSINGS  WALKING IN HALL  A/P HOME TODAY  [name] Steven Munos  MD

## 2015-10-22 NOTE — Progress Notes (Signed)
Problem: Mobility Impaired (Adult and Pediatric)  Goal: *Acute Goals and Plan of Care (Insert Text)  STG:  (1.)Steven [name] will move from supine to sit and sit to supine , scoot up and down and roll side to side with SUPERVISION within 3 day(s).   (2.)Steven [name] will transfer from bed to chair and chair to bed with SUPERVISION using the least restrictive device within 3 day(s).   (3.)Steven [name] will ambulate with SUPERVISION for 250 feet with the least restrictive device within 3 day(s).   (4.)Steven [name] will maintain spinal precautions throughout all functional mobility within 3 days with 0 verbal cues.  (5.)Steven [name] will don/doff spinal brace with 0 cues within 3 days to improve safety with functional mobility.     LTG:  (1.)Steven [name] will move from supine to sit and sit to supine , scoot up and down and roll side to side in bed with MODIFIED INDEPENDENCE within 7 day(s).   (2.)Steven [name] will transfer from bed to chair and chair to bed with MODIFIED INDEPENDENCE using the least restrictive device within 7 day(s).   (3.)Steven [name] will ambulate with MODIFIED INDEPENDENCE for 500 feet with the least restrictive device within 7 day(s).  (4.)Steven [name] will ascend and descend 4 stairs using L hand rail(s) with MODIFIED INDEPENDENCE to improve functional mobility and safety within 7 day(s).    ______________________________________________________________________________________________  PHYSICAL THERAPY: Daily Note, Treatment Day: 2nd and AM  INPATIENT: Hospital Day: 4  Payor: [employer] / Plan: [employer] [place] [employer]EE PLAN / Product Type: PPO /      NAME/AGE/GENDER: Steven [name] is a 59 y.o. 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 male s/p above surgery. Patient was standing at sink upon contact and agreeable to PT. Patient was medicated prior to treatment. Pain is 4/10 before and after treatment. Patient able to perform stair training x 4 stairs holding left rail with verbal cues for proper technique. Patient tolerates task well with no complaints and verbalizes improved confidence with performing stairs at home. Overall good progress towards physical therapy goals. Will continue efforts.    ????????This section established at most recent assessment??????????  PROBLEM LIST (Impairments causing functional limitations):  1. Decreased Strength  2. Decreased ADL/Functional Activities  3. Decreased Transfer Abilities  4. Decreased Ambulation Ability/Technique  5. Decreased Balance  6. Increased Pain  7. Decreased Knowledge of Precautions  8. Decreased Independence with Home Exercise Program  REHABILITATION POTENTIAL FOR STATED GOALS: EXCELLENT      PLAN OF CARE:   INTERVENTIONS PLANNED: (Benefits and precautions of physical therapy have been discussed with the patient.)  1. balance exercise  2. bed mobility  3. family education  4. gait training  5. group therapy  6. home exercise program (HEP)  7. therapeutic activities  8. therapeutic exercise/strengthening  9. transfer training  10. Patient education  FREQUENCY/DURATION: Follow patient BID to address above goals.    v  RECOMMENDED REHABILITATION/EQUIPMENT: (at time of discharge pending progress):   Continue Skilled Therapy and Adaptive Equipment: Walkers, Type: Rolling  Walker.  SUBJECTIVE:   "That was good"     v  Present Symptoms:   Active Ambulatory Problems     Diagnosis Date Noted   ??? BMI 40.0-44.9, adult (HCC)    ??? Generalized anxiety disorder     ??? Other malaise and fatigue    ??? Testicular hypofunction    ??? Allergic rhinitis, cause unspecified    ??? Pre-diabetes    ??? Essential hypertension, benign    ??? Pure hypercholesterolemia 07/07/2012   ??? Left lumbar radiculitis    ??? Vitamin D deficiency    ??? Mood disorder (HCC) 07/14/2014   ??? Venous insufficiency of both lower extremities 07/14/2014   ??? 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 OSA (obstructive sleep apnea) 06/30/2015     Resolved Ambulatory Problems     Diagnosis Date Noted   ??? No Resolved Ambulatory Problems     Past Medical History   Diagnosis Date   ??? Borderline diabetes    ??? Diverticulitis    ??? GERD (gastroesophageal reflux disease)    ??? Hypercholesterolemia    ??? Hypertension    ??? Morbid obesity (HCC)    ??? OSA on CPAP    ??? Polycythemia (HCC) dx--06/2015     Past Surgical History   Procedure Laterality Date   ??? Endoscopy, colon, diagnostic  2011     Diverticulosis   ??? Hx knee arthroscopy Bilateral 04/2015     Partial medial and lateral meniscectomies by Dr. Benjamine Mola   ??? Hx colonoscopy  05/2015     Diverticulosis        Pain Intensity 1: 4  Pain Intervention(s) 1: Ambulation/Increased Activity, Repositioned (Pre-medicated prior to treatment)  History of Present Injury/Illness:   Active Ambulatory Problems     Diagnosis Date Noted   ??? BMI 40.0-44.9, adult (HCC)    ??? Generalized anxiety disorder    ??? Other malaise and fatigue    ??? Testicular hypofunction    ??? Allergic rhinitis, cause unspecified    ??? Pre-diabetes    ??? Essential hypertension, benign    ??? Pure hypercholesterolemia 07/07/2012   ??? Left lumbar radiculitis    ??? Vitamin D deficiency    ??? Mood disorder (HCC) 07/14/2014   ??? Venous insufficiency of both lower extremities 07/14/2014   ??? 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 OSA (obstructive sleep apnea) 06/30/2015     Resolved Ambulatory Problems     Diagnosis Date Noted    ??? No Resolved Ambulatory Problems     Past Medical History   Diagnosis Date   ??? Borderline diabetes    ??? Diverticulitis    ??? GERD (gastroesophageal reflux disease)    ??? Hypercholesterolemia    ??? Hypertension    ??? Morbid obesity (HCC)    ??? OSA on CPAP    ??? Polycythemia (HCC) dx--06/2015     Past Surgical History   Procedure Laterality Date   ??? Endoscopy, colon, diagnostic  2011     Diverticulosis   ??? Hx knee arthroscopy Bilateral 04/2015     Partial medial and lateral meniscectomies by Dr. Benjamine Mola   ??? Hx colonoscopy  05/2015     Diverticulosis     Social History     Social History   ??? Marital status: MARRIED     Spouse name: N/A   ??? Number of children:  N/A   ??? Years of education: N/A     Occupational History   ??? Not on file.     Social History Main Topics   ??? Smoking status: Never Smoker   ??? Smokeless tobacco: Never Used   ??? Alcohol use 0.0 oz/week     0 Cans of beer per week      Comment: social   ??? Drug use: No   ??? Sexual activity: Not on file     Other Topics Concern   ??? Not on file     Social History Narrative     Prior Level of Function/Home Situation: independent with mobility  Home Environment: Private residence  # Steps to Enter: 5  Rails to Enter: Yes  Hand Rails : Left  One/Two Story Residence: One story  Living Alone: No  Support Systems: Games developerpouse/Significant Other/Partner  Patient Expects to be Discharged to:: Private residence  Current DME Used/Available at Home: None  Tub or Shower Type: Shower  OBJECTIVE/TREATMENT:                                       [employer] AM-[employer]??? ???6 Clicks???                                          Basic Mobility Inpatient Short Form  How much difficulty does the patient currently have... Unable A Lot A Little None   1.  Turning over in bed (including adjusting bedclothes, sheets and blankets)?   [ ]  1   [ ]  2   [X]  3   [ ]  4   2.  Sitting down on and standing up from a chair with arms ( e.g., wheelchair, bedside commode, etc.)   [ ]  1   [ ]  2   [X]  3   [ ]  4    3.  Moving from lying on back to sitting on the side of the bed?   [ ]  1   [ ]  2   [X]  3   [ ]  4               How much help from another person does the patient currently need... Total A Lot A Little None   4.  Moving to and from a bed to a chair (including a wheelchair)?   [ ]  1   [ ]  2   [X]  3   [ ]  4   5.  Need to walk in hospital room?   [ ]  1   [ ]  2   [X]  3   [ ]  4   6.  Climbing 3-5 steps with a railing?   [ ]  1   [ ]  2   [X]  3   [ ]  4   ?? 2007, Trustees of [employer], under license to [employer]. All rights reserved       Score:  Initial: 18 Most Recent: X (Date: -- )   Interpretation of Tool:  Represents activities that are increasingly more difficult (i.e. Bed mobility, Transfers, Gait).  Score 24 23 22-20 19-15 14-10 9-7 6   Modifier CH CI CJ CK CL CM CN       ?? Mobility - Walking and Moving Around:  Z6109 - CURRENT STATUS:    CK - 40%-59% impaired, limited or restricted              U0454 - GOAL STATUS:           CI - 1%-19% impaired, limited or restricted              U9811 - D/C STATUS:              ---------------To be determined---------------   Most Recent Physical Functioning:   Gross Assessment:                  Posture:  Posture (WDL): Exceptions to WDL  Posture Assessment: Forward head, Rounded shoulders  Balance:  Sitting: Intact  Standing: Impaired  Standing - Static: Good  Standing - Dynamic : Fair  Bed Mobility:     Wheelchair Mobility:     Transfers:  Sit to Stand: Stand-by asssistance  Stand to Sit: Stand-by asssistance  Gait:     Base of Support: Center of gravity altered  Speed/Cadence: Slow  Step Length: Left shortened;Right shortened  Gait Abnormalities: Decreased step clearance;Trunk sway increased  Distance (ft): 100 Feet (ft)  Assistive Device: Walker, rolling  Number of Stairs Trained: 4  Stairs - Level of Assistance: Contact guard assistance  Rail Use: Left   Interventions: Safety awareness training;Verbal cues;Tactile cues         Therapeutic Activity: (    15 minutes):  Therapeutic activities including transfer training, static/dynamic standing balance activities,  Ambulation on level ground, stair training, and patient education on post-op spinal precautions/log rolling and brace to improve mobility, strength, balance and coordination.  Required minimal Safety awareness training;Verbal cues;Tactile cues to promote static and dynamic balance in standing, promote coordination of bilateral, lower extremity(s) and to promote improved ability to maintain spinal precautions throughout functional movements.      Braces/Orthotics/Lines/Etc:   ?? O2 Room Air  ?? Spinal brace  Safety:   After treatment position/precautions:  ?? Bed/Chair-wheels locked, Bed in low position, Call light within reach, RN notified and seated EOB     Progression/Medical Necessity:   ?? Patient demonstrates excellent rehab potential due to higher previous functional level.  Compliance with Program/Exercises: Will assess as treatment progresses.   Reason for Continuation of Services/Other Comments:  ?? Patient continues to require modification of therapeutic interventions to increase complexity of exercises.  Recommendations/Intent for next treatment session: Treatment next visit will focus on advancements to more challenging activities and reduction in assistance provided.  Total Treatment Duration:  Time In: 1045  Time Out: [address] Bink[place], PTA

## 2015-10-22 NOTE — Other (Signed)
Discharge instructions provided to patient. Prescriptions provided. All questions answered and IV removed. Awaiting ride.

## 2015-10-22 NOTE — Discharge Summary (Signed)
Discharge Summary     Patient ID:  Steven [name]  [id number]   59 y.o.  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    Disposition: Home    Patient Instructions:   Cannot display discharge medications since this patient is not currently admitted.      Diet: Reference my discharge instructions.    Activity: Reference my discharge instructions.    Follow-up Appointments   Procedures   ??? FOLLOW UP VISIT Appointment in: Two Weeks     Standing Status:   Standing     Number of Occurrences:   1     Order Specific Question:   Appointment in     Answer:   Two Weeks        Total time discharging patient took greater than 30 minutes.    Signed:  Darl Householderhristie B Tallia Moehring, MD  November 06, 2015  12:30 PM

## 2015-10-22 NOTE — Op Note (Signed)
Steven DuboisMichael L [name] [id number]  [id number]    07-Aug-1956  59 y.o.  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 Left L4-L5 far lateral approach to total facetectomy, partial hemilaminectomy, and foraminotomy for decompression of nerves.  2. Transforaminal lumbar interbody fusion at L4-L5 with local autograft, BMP, and interbody cage (Elevate, Medtronic).  3. Harvest of local autograft.  4. Sextant (Medtronic) posterior pedicle screw instrumentation, L4-L5 left unilateral.  5. Microsurgery.  6. Intraoperative fluoroscopic guidance.  7. Right L4-L5 and bilateral L3-4 and L5-S1 partial hemilaminectomies, medial facetectomies, and foraminotomies for decompression of nerves.    Procedure in Detail:   After appropriate informed consent was obtained, the patient was taken to the operating suite where satisfactory general anesthesia was induced. The patient was then turned into the prone position on the operating table on chest rolls. All pressure points were carefully padded. Perioperative antibiotics were given. The lumbar region was shaved, prepped and draped in the usual sterile fashion. Intraoperative fluoroscopy was utilized to localize the L4-L5 level. The skin was infiltrated with 1% lidocaine with epinephrine.     Next, a 26 mm vertical linear incision was made 4.5 cm to the left of the midline directly over the L4-L5 level. Dissection was carried down through the subcutaneous tissue. The fascia was perforated using the K-wire. The sequential dilators were passed over the K-wire and were docked on the left L4-L5 facet under fluoroscopic guidance. A tubular retractor was  passed over the dilators. This was also docked on the facet under fluoroscopic guidance. The retractor was secured firmly in place using the articulating arm. The dilators were removed from the wound and the operating microscope was brought into the field.    A small amount of soft tissue was removed from the left L4-L5 facet. The Midas-Rex drill and Kerrison rongeur were used to perform a total facetectomy and partial hemilaminectomy for decompression of nerves. Bone was harvested during this part of the procedure for use as local autograft later in the case. The Kerrison rongeur was used to remove the underlying ligamentum flavum with care being given to protection of the nerve roots and the thecal sac. The left L4 and L5 nerve roots were both identified and were mobilized using microsurgical technique.  Epidural veins were coagulated and sharply divided. The pedicles were identified both superiorly and inferiorly. The L4-L5 disc space was identified and was incised sharply using the METRx knife. Disc material was removed from the disc space in a piecemeal fashion using the pituitary rongeurs. The nerves were inspected and were found to be completely decompressed.    Next, the sequential distractors were inserted into the disc space under fluoroscopic guidance. Additional disc material was removed. The endplates were carefully prepared using reversed angle curettes and pituitary rongeurs. Once the disc space had been completely cleaned of disc material and cartilaginous endplate, local autograft harvested earlier in the case was packed into the disc space far to the right side.  A BMP sponge which had been prepared at the beginning of the case was also packed into the disc space with the autograft.  Next, an Elevate cage was brought up to the field and soaked in antibiotic solution.  The center of the cage was packed with another BMP sponge.  The cage was carefully tapped into the  disc space under fluoroscopic guidance.  The cage was expanded appropriately.  Intraoperative fluoroscopy confirmed excellent cage placement. Next, the operating microscope was removed from the field. The tubular retractor was removed from the wound.    Intraoperative fluoroscopy was used to localize the L4-L5 level. The skin was infiltrated with 1% lidocaine with epinephrine. A 16 mm vertical linear incision was then made directly over the L4-L5 level 1.5 cm to the right of midline. Dissection was carried down through the subcutaneous tissue. The fascia was perforated using a K wire. The sequential dilators were passed over the K wire and were docked on the L4-L5 laminae under fluoroscopic guidance. A tubular retractor was passed over the dilators and was also docked on the L4-L5 laminae under fluoroscopic guidance. The retractor was secured firmly in place using the articulating arm. The dilators were removed from the wound. The operating microscope was brought into the field.      Next, a small amount of soft tissue was removed from the right L4-L5 laminae. The Midas-Rex high speed drill and Kerrison rongeur were used to perform a right L4-L5 partial hemilaminectomy.  A medial facetectomy was also carried out.  The thickened ligament was removed with care being given to protection of the underlying neural elements. The right L5 nerve root was identified and was gently mobilized using microsurgical technique.  A generous foraminotomy was carried out over the right L5 nerve root. The nerves were then inspected and were found to be completely free. The wound was then irrigated copiously with antibiotic solution. Meticulous hemostasis was obtained. The operating microscope was removed from the field. The tubular retractor was removed from the wound. The fascia was closed using a single 0 Vicryl suture. The subcutaneous tissue was closed in a layered fashion.  The exact same procedure was then  carried out bilaterally at L3-4 and L5-S1 with the same findings and results.     Intraoperative fluoroscopy was used to localize the left L4 and L5 pedicles. Jam-Shidi needles were driven down both pedicles under fluoroscopic guidance using the existing incision. K-wires were passed down the needle cannulas which were then removed over the K-wires. The pedicles were tapped percutaneously under fluoroscopic guidance. Sextant pedicle screws were then passed over the K-wires and were driven down the pedicles under fluoroscopic guidance. The K-wires were removed from the wounds. Intraoperative fluoroscopy confirmed appropriate screw placement.     Next, the screw extenders were hooked together in an appropriate fashion. The Sextant device was attached to the screw extenders. A small stab incision was made in the left flank. A Sextant trocar was passed into the subcutaneous tissue and was driven down to the pedicle screw heads. Measurements were taken for the appropriate length of rod. The trocar was removed from the wound. A Sextant rod was loaded onto the Sextant device and was passed through both screw heads appropriately. Intraoperative fluoroscopy confirmed appropriate positioning of the rod in both screw heads.  The set screws were tightened down firmly. The heads were broken off. The screw extenders and the Sextant device were removed from the wound.     Next, the wounds were all irrigated out copiously with antibiotic solution. The fascia was closed using interrupted 0 Vicryl sutures. The subcutaneous tissue was closed in a layered fashion.  The skin edges were approximated using Dermabond. Sterile dressings were applied to each wound. The patient was then turned back into the supine position on the stretcher where satisfactory anesthesia was reversed.    The patient was then taken to the recovery room in satisfactory condition.    Estimated Blood Loss:   75cc    Implants:    Implant Name Type Inv. Item Serial No. Manufacturer Lot No. LRB No. Used Action   GRAFT BNE RECOMB HUM INFUSE XS --  - [id number]  GRAFT BNE RECOMB HUM INFUSE XS --   [employer] [facility] DANEK [id number] N/A 1 Implanted   expandable interbody device,extra lordotic l=28 h=11 w=10     [id number] N/A 1 Implanted   SET SCR NS SXT BRKOFF 4.75 TI --  - [id number]  SET SCR NS SXT BRKOFF 4.75 TI --   [employer] [facility] DANEK [id number] N/A 2 Implanted   ROD SEXTANT 4.75 CCM --  - [id number]  ROD SEXTANT 4.75 CCM --   [employer] [facility] DANEK [id number] N/A 1 Implanted   SCR CANN AXIIAL 6.5X55MM TI --  - [id number]   SCR CANN AXIIAL 6.5X55MM TI --    [employer] [id number] N/A 2 Implanted               Specimens: None        Drains: None                Complications: None    Counts: Sponge and needle counts were correct times two.    Signed By:  Darl Householder, MD     October 22, 2015

## 2015-10-22 NOTE — Progress Notes (Signed)
Patient wheeled down by cna Arnetha CourserKadie

## 2015-10-25 ENCOUNTER — Encounter: Payer: PRIVATE HEALTH INSURANCE | Attending: Family Medicine | Primary: Family Medicine

## 2015-10-25 ENCOUNTER — Encounter: Attending: Family Medicine | Primary: Family Medicine

## 2015-11-02 ENCOUNTER — Ambulatory Visit: Payer: PRIVATE HEALTH INSURANCE | Primary: Family Medicine

## 2015-11-18 ENCOUNTER — Encounter

## 2015-11-18 MED ORDER — ERGOCALCIFEROL (VITAMIN D2) 50,000 UNIT CAP
1250 mcg (50,000 unit) | ORAL_CAPSULE | ORAL | 0 refills | Status: DC
Start: 2015-11-18 — End: 2015-12-05

## 2015-11-18 MED ORDER — METFORMIN 1,000 MG TAB
1000 mg | ORAL_TABLET | ORAL | 0 refills | Status: DC
Start: 2015-11-18 — End: 2015-12-05

## 2015-11-18 MED ORDER — METOPROLOL SUCCINATE SR 100 MG 24 HR TAB
100 mg | ORAL_TABLET | Freq: Every evening | ORAL | 0 refills | Status: DC
Start: 2015-11-18 — End: 2015-12-05

## 2015-11-18 MED ORDER — VALSARTAN-HYDROCHLOROTHIAZIDE 320 MG-12.5 MG TAB
ORAL_TABLET | ORAL | 0 refills | Status: DC
Start: 2015-11-18 — End: 2015-12-05

## 2015-11-18 MED ORDER — PRAVASTATIN 40 MG TAB
40 mg | ORAL_TABLET | ORAL | 0 refills | Status: DC
Start: 2015-11-18 — End: 2015-12-05

## 2015-11-18 MED ORDER — TESTOSTERONE CYPIONATE 200 MG/ML IM OIL
200 mg/mL | INTRAMUSCULAR | 0 refills | Status: DC
Start: 2015-11-18 — End: 2015-12-05

## 2015-11-18 NOTE — Telephone Encounter (Signed)
rx called in.

## 2015-11-18 NOTE — Telephone Encounter (Signed)
From: Steven [name]  To: Allene Dillon, MD  Sent: 11/18/2015 12:17 PM EST  Subject:  Medication Renewal Request    Original  authorizing provider: Allene Dillon, MD    Steven [name] would like a refill of the following medications:  testosterone  cypionate (DEPOTESTOTERONE CYPIONATE) 200 mg/mL injection [name] [name], MD]    Preferred  pharmacy: [id number] - [place], [place] - [address] AT [address]    Comment:  I  need Refills (before Appt Dr. Alfredo Bach 12-05-15). - Vit D2 1.25 mg (50k unit) RX #[id number] Last filled On 09-23-15 - I am out now. - Metoprolol Succ Er 100 mg RX #[id number] Last filled On 10-26-15 - I am out next week. - Pravastatin Sodium 40 mg RX #[id number] Last filled On 10-12-15 - I am out on Monday (11-21-15). - Valsartin-HCTZ 320-12.5 mg RX #[id number] Last filled On 10-12-15 - I am out on Monday (11-21-15). - Metformin 1k mg RX #[id number] Last filled On 10-05-15 - I am out on Monday (11-21-15). Thank you

## 2015-12-05 ENCOUNTER — Ambulatory Visit
Admit: 2015-12-05 | Discharge: 2015-12-05 | Payer: PRIVATE HEALTH INSURANCE | Attending: Family Medicine | Primary: Family Medicine

## 2015-12-05 DIAGNOSIS — I10 Essential (primary) hypertension: Secondary | ICD-10-CM

## 2015-12-05 LAB — AMB POC URINALYSIS DIP STICK MANUAL W/ MICRO
Bilirubin (UA POC): NEGATIVE
Blood (UA POC): NEGATIVE
Crystals (UA POC): NEGATIVE
Glucose (UA POC): NEGATIVE
Ketones (UA POC): NEGATIVE
Leukocyte esterase (UA POC): NEGATIVE
Nitrites (UA POC): NEGATIVE
Protein (UA POC): NEGATIVE mg/dL
Specific gravity (UA POC): 1.01 (ref 1.001–1.035)
Urobilinogen (UA POC): 0.2 (ref 0.2–1)
pH (UA POC): 6.5 (ref 4.6–8.0)

## 2015-12-05 LAB — AMB POC COMPLETE CBC,AUTOMATED ENTER
ABS. GRANS (POC): 3.8 10*3/uL (ref 1.4–6.5)
ABS. LYMPHS (POC): 1.9 10*3/uL (ref 1.2–3.4)
ABS. MONOS (POC): 0.4 10*3/uL (ref 0.1–0.6)
GRANULOCYTES (POC): 62.1 % (ref 42.2–75.2)
HCT (POC): 54.6 % (ref 35–60)
HGB (POC): 18.7 g/dL — AB (ref 11–18)
LYMPHOCYTES (POC): 30.9 % (ref 20.5–51.1)
MCH (POC): 31.1 pg — AB (ref 27–31)
MCHC (POC): 34.3 g/dL (ref 33–37)
MCV (POC): 90.8 fL (ref 80–99.9)
MONOCYTES (POC): 7 % (ref 1.7–9.3)
MPV (POC): 8.6 fL (ref 7.8–11)
PLATELET (POC): 202 10*3/uL (ref 150–450)
RBC (POC): 6.01 M/uL — AB (ref 4–6)
RDW (POC): 16.1 % — AB (ref 11.6–13.7)
WBC (POC): 6.1 10*3/uL (ref 4.5–10.5)

## 2015-12-05 MED ORDER — TESTOSTERONE CYPIONATE 200 MG/ML IM OIL
200 mg/mL | INTRAMUSCULAR | 5 refills | Status: DC
Start: 2015-12-05 — End: 2016-01-23

## 2015-12-05 MED ORDER — METFORMIN 1,000 MG TAB
1000 mg | ORAL_TABLET | ORAL | 0 refills | Status: DC
Start: 2015-12-05 — End: 2016-01-23

## 2015-12-05 MED ORDER — ERGOCALCIFEROL (VITAMIN D2) 50,000 UNIT CAP
1250 mcg (50,000 unit) | ORAL_CAPSULE | ORAL | 0 refills | Status: DC
Start: 2015-12-05 — End: 2016-02-13

## 2015-12-05 MED ORDER — VALSARTAN-HYDROCHLOROTHIAZIDE 320 MG-12.5 MG TAB
ORAL_TABLET | ORAL | 5 refills | Status: DC
Start: 2015-12-05 — End: 2016-05-31

## 2015-12-05 MED ORDER — PRAVASTATIN 40 MG TAB
40 mg | ORAL_TABLET | ORAL | 5 refills | Status: DC
Start: 2015-12-05 — End: 2016-05-31

## 2015-12-05 MED ORDER — METOPROLOL SUCCINATE SR 100 MG 24 HR TAB
100 mg | ORAL_TABLET | Freq: Every evening | ORAL | 5 refills | Status: DC
Start: 2015-12-05 — End: 2016-05-31

## 2015-12-05 NOTE — Progress Notes (Signed)
HISTORY OF PRESENT ILLNESS  Steven [name] is a 60 y.o. male.  HPI  Chronic f/u - HLP, Prediabetes, low testosterone, HTN:   meds working well, no se's. Still doing testosterone injections - last one was last sat (does them weekly so is 2 days late).  Been a few yrs since prostate exam he says.  Also been W/o statin x 1 mos b/c ran out.   Sometimes has trouble sleeping, takes melatonin almost every night. Is on CPAP since last year. Prefers not to do Rx for sleep if possible. Does drink caffeine in afternoons.      Right ankle - rolled it a few times - after his back surgery could walk, got off pain meds. Rolled the ankle, now can't walk on it. Been doing RICE, stopped walking x 1 wk, just restarted and got back up to 1 mile. Rolled it twice yesterday, wearing ankle high boots. Walks trails. Rolls outward. No swelling this time. Did have some swelling 2 wks ago.  (10/19/15 - back surgery with dr. Dorene Grebe)      Weight - had lost 18lbs after surgery in 1 month b/c could exercise again. Put back on 10-11 lbs in past 2-3 weeks b/c of lack of exercise. Goal weight 250lbs.     Review of Systems   Constitutional: Negative for chills, fever and malaise/fatigue.   HENT: Negative for congestion, ear pain and sore throat.    Eyes: Negative for blurred vision, pain and redness.   Respiratory: Negative for cough and shortness of breath.    Cardiovascular: Negative for chest pain and palpitations.   Gastrointestinal: Negative for abdominal pain, nausea and vomiting.   Genitourinary: Negative for dysuria.   Musculoskeletal: Positive for joint pain. Negative for back pain and myalgias.   Skin: Negative for rash.   Neurological: Negative for dizziness and headaches.   Psychiatric/Behavioral: Negative for depression.     Visit Vitals   ??? BP 144/82 (BP 1 Location: Right arm, BP Patient Position: Sitting)   ??? Pulse 87   ??? Temp 98 ??F (36.7 ??C) (Oral)   ??? Ht  (1.778 m)   ??? Wt 282 lb (127.9 kg)   ??? BMI 40.46 kg/m2      Physical Exam    Constitutional: He is oriented to person, place, and time. He appears well-developed and well-nourished. No distress.   HENT:   Head: Normocephalic and atraumatic.   Neck: No JVD present. No thyromegaly present.   Cardiovascular: Normal rate, regular rhythm, normal heart sounds and intact distal pulses.  Exam reveals no gallop and no friction rub.    No murmur heard.  Pulmonary/Chest: Effort normal and breath sounds normal. He has no wheezes.   Musculoskeletal:        Right ankle: He exhibits normal range of motion, no swelling, no ecchymosis and no deformity. No tenderness. Achilles tendon normal.   Some pain with passive ankle movements but mild   Neurological: He is alert and oriented to person, place, and time.   Skin: Skin is warm and dry.   Nursing note and vitals reviewed.      ASSESSMENT and PLAN    ICD-10-CM ICD-9-CM    1. Essential hypertension, benign I10 401.1 valsartan-hydroCHLOROthiazide (DIOVAN-HCT) 320-12.5 mg per tablet      metoprolol succinate (TOPROL-XL) 100 mg tablet      AMB POC COMPLETE CBC,AUTOMATED ENTER      AMB POC URINALYSIS DIP STICK MANUAL W/ MICRO   2. Pure hypercholesterolemia  E78.00 272.0 pravastatin (PRAVACHOL) 40 mg tablet      METABOLIC PANEL, COMPREHENSIVE      LIPID PANEL   3. 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 Pre-diabetes R73.03 790.29 metFORMIN (GLUCOPHAGE) 1,000 mg tablet   5. Vitamin D deficiency E55.9 268.9 ergocalciferol (VITAMIN D2) 50,000 unit capsule      VITAMIN D, 25 HYDROXY   6. Sprain of right ankle, unspecified ligament, initial encounter S93.401A 845.00    check labs, adjust meds as needed. Has been off statin x 1 mos. Continue working on weight loss, has been successful when able to exercise. If hits plateau despite good diet/exercise RTC and can discuss med options.    Ankle sprain - mild - went over instructions for wearing ankle brace until pain improves, then use when walking for exercise. Change from boots to tennis shoes (plus ankle brace) while exercising. If not improving let us know. Home PT exercise given also.   Sleep issues - mild insomnia at times, cont melatonin but avoid caffeine after 12pm and went over good sleep hygeine. RTC if not improving.  Pt overdue for prostate exam (is on testosterone) - needs to schedule CPE

## 2015-12-06 LAB — LIPID PANEL
Cholesterol, total: 173 mg/dL (ref 100–199)
HDL Cholesterol: 28 mg/dL — ABNORMAL LOW (ref >39)
LDL, calculated: 86 mg/dL (ref 0–99)
Triglyceride: 297 mg/dL — ABNORMAL HIGH (ref 0–149)
VLDL, calculated: 59 mg/dL — ABNORMAL HIGH (ref 5–40)

## 2015-12-06 LAB — METABOLIC PANEL, COMPREHENSIVE
A-G Ratio: 1.8 (ref 1.1–2.5)
ALT (SGPT): 36 IU/L (ref 0–44)
AST (SGOT): 27 IU/L (ref 0–40)
Albumin: 4.4 g/dL (ref 3.5–5.5)
Alk. phosphatase: 82 IU/L (ref 39–117)
BUN/Creatinine ratio: 16 (ref 9–20)
BUN: 19 mg/dL (ref 6–24)
Bilirubin, total: 0.2 mg/dL (ref 0.0–1.2)
CO2: 18 mmol/L (ref 18–29)
Calcium: 9.4 mg/dL (ref 8.7–10.2)
Chloride: 100 mmol/L (ref 96–106)
Creatinine: 1.16 mg/dL (ref 0.76–1.27)
GFR est AA: 80 mL/min/{1.73_m2} (ref >59)
GFR est non-AA: 69 mL/min/{1.73_m2} (ref >59)
GLOBULIN, TOTAL: 2.5 g/dL (ref 1.5–4.5)
Glucose: 121 mg/dL — ABNORMAL HIGH (ref 65–99)
Potassium: 4.6 mmol/L (ref 3.5–5.2)
Protein, total: 6.9 g/dL (ref 6.0–8.5)
Sodium: 138 mmol/L (ref 134–144)

## 2015-12-06 LAB — VITAMIN D, 25 HYDROXY: VITAMIN D, 25-HYDROXY: 30.2 ng/mL (ref 30.0–100.0)

## 2015-12-06 LAB — PSA, DIAGNOSTIC (PROSTATE SPECIFIC AG): Prostate Specific Ag: 0.4 ng/mL (ref 0.0–4.0)

## 2015-12-07 LAB — TESTOSTERONE, TOTAL, ADULT MALE: Testosterone: 183 ng/dL — ABNORMAL LOW (ref 348–1197)

## 2015-12-11 ENCOUNTER — Encounter

## 2015-12-12 ENCOUNTER — Ambulatory Visit
Admit: 2015-12-12 | Discharge: 2015-12-19 | Payer: PRIVATE HEALTH INSURANCE | Attending: Hematology & Oncology | Primary: Family Medicine

## 2015-12-12 ENCOUNTER — Inpatient Hospital Stay: Admit: 2015-12-12 | Payer: PRIVATE HEALTH INSURANCE | Primary: Family Medicine

## 2015-12-12 DIAGNOSIS — D751 Secondary polycythemia: Secondary | ICD-10-CM

## 2015-12-12 DIAGNOSIS — M7989 Other specified soft tissue disorders: Secondary | ICD-10-CM

## 2015-12-12 LAB — CBC WITH AUTOMATED DIFF
ABS. BASOPHILS: 0.1 10*3/uL (ref 0.0–0.2)
ABS. EOSINOPHILS: 0.1 10*3/uL (ref 0.0–0.8)
ABS. LYMPHOCYTES: 1.6 10*3/uL (ref 0.5–4.6)
ABS. MONOCYTES: 0.7 10*3/uL (ref 0.1–1.3)
ABS. NEUTROPHILS: 4.1 10*3/uL (ref 1.7–8.2)
ABSOLUTE NRBC: 0 10*3/uL (ref 0.0–0.2)
BASOPHILS: 1 % (ref 0.0–2.0)
EOSINOPHILS: 1 % (ref 0.5–7.8)
HCT: 53.5 % — ABNORMAL HIGH (ref 41.1–50.3)
HGB: 18.3 g/dL — ABNORMAL HIGH (ref 13.6–17.2)
LYMPHOCYTES: 25 % (ref 13–44)
MCH: 30.1 PG (ref 26.1–32.9)
MCHC: 34.2 g/dL (ref 31.4–35.0)
MCV: 88 FL (ref 79.6–97.8)
MONOCYTES: 11 % (ref 4.0–12.0)
MPV: 10 FL — ABNORMAL LOW (ref 10.8–14.1)
NEUTROPHILS: 62 % (ref 43–78)
PLATELET: 181 10*3/uL (ref 150–450)
RBC: 6.08 M/uL — ABNORMAL HIGH (ref 4.23–5.67)
RDW: 14 % (ref 11.9–14.6)
WBC: 6.5 10*3/uL (ref 4.3–11.1)

## 2015-12-12 LAB — METABOLIC PANEL, COMPREHENSIVE
A-G Ratio: 1.2 (ref 1.2–3.5)
ALT (SGPT): 47 U/L (ref 12–65)
AST (SGOT): 28 U/L (ref 15–37)
Albumin: 3.9 g/dL (ref 3.5–5.0)
Alk. phosphatase: 79 U/L (ref 50–136)
Anion gap: 9 mmol/L (ref 7–16)
BUN: 19 MG/DL (ref 6–23)
Bilirubin, total: 0.5 MG/DL (ref 0.2–1.1)
CO2: 24 mmol/L (ref 23–32)
Calcium: 8.9 MG/DL (ref 8.3–10.4)
Chloride: 102 mmol/L (ref 98–107)
Creatinine: 1.29 MG/DL (ref 0.8–1.5)
GFR est AA: >60 mL/min/{1.73_m2} (ref >60)
GFR est non-AA: >60 mL/min/{1.73_m2} (ref >60)
Globulin: 3.3 g/dL (ref 2.3–3.5)
Glucose: 121 mg/dL — ABNORMAL HIGH (ref 65–100)
Potassium: 4.2 mmol/L (ref 3.5–5.1)
Protein, total: 7.2 g/dL (ref 6.3–8.2)
Sodium: 135 mmol/L — ABNORMAL LOW (ref 136–145)

## 2015-12-12 NOTE — Patient Instructions (Addendum)
Patient Instructions From Your Nurse    Reason for Visit:  Follow up for polycythemia    Plan:  Continue taking baby aspirin daily.  Make sure you keep wearing the CPAP every night. It is definitely helping your hematocrit levels.  Continue doing light exercise to help losing weight.  We will ultrasound your right leg to make sure there is no blood clot since it is a little swollen.      Follow Up:  Follow up with Dr. Faith Rogue in 4 months.    Recent Lab Results:  Recent Results (from the past 12 hour(s))   CBC WITH AUTOMATED DIFF    Collection Time: 12/12/15  9:42 AM   Result Value Ref Range    WBC 6.5 4.3 - 11.1 K/uL    RBC 6.08 (H) 4.23 - 5.67 M/uL    HGB 18.3 (H) 13.6 - 17.2 g/dL    HCT 53.5 (H) 41.1 - 50.3 %    MCV 88.0 79.6 - 97.8 FL    MCH 30.1 26.1 - 32.9 PG    MCHC 34.2 31.4 - 35.0 g/dL    RDW 14.0 11.9 - 14.6 %    PLATELET 181 150 - 450 K/uL    MPV 10.0 (L) 10.8 - 14.1 FL    ABSOLUTE NRBC 0.00 0.0 - 0.2 K/uL    DF AUTOMATED      NEUTROPHILS 62 43 - 78 %    LYMPHOCYTES 25 13 - 44 %    MONOCYTES 11 4.0 - 12.0 %    EOSINOPHILS 1 0.5 - 7.8 %    BASOPHILS 1 0.0 - 2.0 %    ABS. NEUTROPHILS 4.1 1.7 - 8.2 K/UL    ABS. LYMPHOCYTES 1.6 0.5 - 4.6 K/UL    ABS. MONOCYTES 0.7 0.1 - 1.3 K/UL    ABS. EOSINOPHILS 0.1 0.0 - 0.8 K/UL    ABS. BASOPHILS 0.1 0.0 - 0.2 K/UL   METABOLIC PANEL, COMPREHENSIVE    Collection Time: 12/12/15  9:42 AM   Result Value Ref Range    Sodium 135 (L) 136 - 145 mmol/L    Potassium 4.2 3.5 - 5.1 mmol/L    Chloride 102 98 - 107 mmol/L    CO2 24 23 - 32 mmol/L    Anion gap 9 7 - 16 mmol/L    Glucose 121 (H) 65 - 100 mg/dL    BUN 19 6 - 23 MG/DL    Creatinine 1.29 0.8 - 1.5 MG/DL    GFR est AA >60 >60 ml/min/1.93m    GFR est non-AA >60 >60 ml/min/1.766m   Calcium 8.9 8.3 - 10.4 MG/DL    Bilirubin, total 0.5 0.2 - 1.1 MG/DL    ALT (SGPT) 47 12 - 65 U/L    AST (SGOT) 28 15 - 37 U/L    Alk. phosphatase 79 50 - 136 U/L    Protein, total 7.2 6.3 - 8.2 g/dL    Albumin 3.9 3.5 - 5.0 g/dL     Globulin 3.3 2.3 - 3.5 g/dL    A-G Ratio 1.2 1.2 - 3.5         Care plan has been given to patient: n/a        -------------------------------------------------------------------------------------------------------------------  Please call our office at ([phone] you have any  of the following symptoms:   ?? Fever of 100.5 or greater  ?? Chills  ?? Shortness of breath  ?? Swelling or pain in one leg    After office hours  an answering service is available and will contact a provider for emergencies or if you are experiencing any of the above symptoms.    ? Patient did express an interest in My Chart.  My Chart log in information explained on the after visit summary printout at the [facility] desk.    Loel Lofty, RN

## 2015-12-12 NOTE — Progress Notes (Signed)
Data Source: Patient, [facility] record.    12/12/2015    10:23 AM    Steven [name] [id number]    60 y.o.      Patient Encounter: [facility] Visit    Heme Diagnosis:  polycythemia  Heme History (Copied from prior):   3 male, non-smoker, commercial sign business owner, h/o obesity, b/l knee arthroscopy (6/16), vit D deficiency, GERD, diverticulitis, allergic rhinitis, lower back pain related to herniated disc 's with plans to undergo laminectomy & fusion. Surgery was deferred related to insurance approval however was also found to have polycythemia, and as such referred to [place]. Review of labs reveals Hgb 18-19 range since 9/13. Hct 55-61 range. Other cell lines are normal. More recently has had some increase in his Cr 1.4-1.6. Recently seen by pulmonology, and diagnosed w moderate sleep apnea, and now on CPAP x 1 month, reports being regular w/ it as makes him sleep better. Notes chronic LE swelling x 3 yeas. Notes having had a detailed cardiac evaluation and was unremarkable.   Interval History:  12/11/14: S/p back surgery w Dr Raul Del approx 5 weeks ago, recovering well w wound completely healed. Reports regular w CPAP. Back on daily baby aspirin. Hct better/improving. Lt leg swelling x 1 week: will get dopplers r/o DVT.    NCCN Distress Score:  -  REVIEW OF SYSTEMS:  As mentioned above, all other systems were reviewed in full and are negative.    Past Medical History   Diagnosis Date   ??? Allergic rhinitis, cause unspecified    ??? Chronic left sacroiliac joint pain    ??? Chronic radicular low back pain          ??? Diverticulitis    ??? Generalized anxiety disorder    ??? GERD (gastroesophageal reflux disease)      occ no meds   ??? Hypercholesterolemia    ??? Hypertension    ??? Left lumbar radiculitis      He has had low back pain that radiates down his LLE for years.   ??? Morbid obesity ([place])      bmi = 40   ??? OSA on CPAP      wears cpap at hs   ??? Other malaise and fatigue      improved with cpap    ??? Polycythemia ([place]) dx--06/2015     saw dr Luan Pulling-- thought related to sleep apnea   ??? Prediabetes    ??? Vitamin D deficiency              Past Surgical History   Procedure Laterality Date   ??? Endoscopy, colon, diagnostic  2011     Diverticulosis   ??? Hx knee arthroscopy Bilateral 04/2015     Partial medial and lateral meniscectomies by Dr. Eliseo Squires   ??? Hx colonoscopy  05/2015     Diverticulosis   ??? Hx orthopaedic Left 10/19/2015     L4-5 TLIF, decompression. Dr. Raul Del       Current Outpatient Prescriptions   Medication Sig Dispense Refill   ??? valsartan-hydroCHLOROthiazide (DIOVAN-HCT) 320-12.5 mg per tablet TAKE 1 TABLET BY MOUTH EVERY DAY 30 Tab 5   ??? metoprolol succinate (TOPROL-XL) 100 mg tablet Take 1 Tab by mouth nightly. Indications: hypertension 30 Tab 5   ??? pravastatin (PRAVACHOL) 40 mg tablet TAKE 1 TABLET BY MOUTH AT BEDTIME 30 Tab 5   ??? testosterone cypionate (DEPOTESTOTERONE CYPIONATE) 200 mg/mL injection INJECT 1/2 MILLILITER EVERY 7 DAYS 1 mL 5   ???  metFORMIN (GLUCOPHAGE) 1,000 mg tablet TAKE ONE TABLET BY MOUTH TWICE A DAY 60 Tab 0   ??? ergocalciferol (VITAMIN D2) 50,000 unit capsule TAKE 1 CAP BY MOUTH EVERY SEVEN (7) DAYS. INDICATIONS: VITAMIN D DEFICIENCY---every wednesday 4 Cap 0   ??? aspirin (ASPIRIN LOW-STRENGTH) 81 mg chewable tablet Take 81 mg by mouth every morning. Indications: continue     ??? MULTI-VITAMIN HI-PO PO Take 2 Tabs by mouth every morning. Indications: hold until after surgery         Social History     Social History   ??? Marital status: MARRIED     Spouse name: N/A   ??? Number of children: N/A   ??? Years of education: N/A     Social History Main Topics   ??? Smoking status: Never Smoker   ??? Smokeless tobacco: Never Used   ??? Alcohol use 0.0 oz/week     0 Cans of beer per week      Comment: social   ??? Drug use: No   ??? Sexual activity: Not Asked     Other Topics Concern   ??? None     Social History Narrative       Family History   Problem Relation Age of Onset   ??? Diabetes Mother     ??? Alzheimer Mother    ??? Heart Disease Father      atherosclerosis (heavy smoker)   ??? Diabetes Sister    ??? Coronary Artery Disease Neg Hx    ??? Colon Cancer Neg Hx        Allergies   Allergen Reactions   ??? Nsaids (Non-Steroidal Anti-Inflammatory Drug) Other (comments)     Cause pedal edema if taken for a number of days in a row       PHYSICAL EXAMINATION:  General Appearance: Healthy appearing patient in no acute distress  Vitals reviewed.   Visit Vitals   ??? BP 149/78  Comment: standing   ??? Pulse 81   ??? Temp 98.5 ??F (36.9 ??C) (Oral)   ??? Resp 18   ??? Ht 5' 10"  (1.778 m)   ??? Wt 285 lb (129.3 kg)   ??? SpO2 96%   ??? BMI 40.89 kg/m2     HEENT: No oral or pharyngeal masses, ulceration or thrush noted, no sinus tenderness.  Neck is supple with no thyromegaly or JVD noted.  Lymph Nodes: No lymphadenopathy noted in the occipital, pre and post auricular, cervical, supra and infraclavicular, axillary, epitrochlear, inguinal, and popliteal region.  Breasts: No palpable masses, nipple discharge or skin retraction  Lungs/Thorax: Clear to auscultation, no accessory muscles of respiration being used.  Heart: Regular rate and rhythm, normal S1, S2, no appreciable murmurs, rubs, gallops  Abdomen: Soft, nontender, bowel sounds present, no appreciable hepatosplenomegaly, no palpable masses  Extremeties: Good pulses bilaterally, no peripheral edema.  Skin: Normal skin tone with no rash, petechiae, ecchymosis noted.  Musculoskeletal: No pain on palpation over bony prominence, no edema, no evidence of gout, no joint or bony deformity  Neurologic: Grossly intact        LABS/IMAGING:    Lab Results   Component Value Date/Time    WBC 6.5 12/12/2015 09:42 AM    HGB 18.3 12/12/2015 09:42 AM    HCT 53.5 12/12/2015 09:42 AM    PLATELET 181 12/12/2015 09:42 AM    MCV 88.0 12/12/2015 09:42 AM       Lab Results   Component Value Date/Time    Sodium 135  12/12/2015 09:42 AM    Potassium 4.2 12/12/2015 09:42 AM    Chloride 102 12/12/2015 09:42 AM     CO2 24 12/12/2015 09:42 AM    Anion gap 9 12/12/2015 09:42 AM    Glucose 121 12/12/2015 09:42 AM    BUN 19 12/12/2015 09:42 AM    Creatinine 1.29 12/12/2015 09:42 AM    BUN/Creatinine ratio 16 12/05/2015 09:58 AM    GFR est AA >60 12/12/2015 09:42 AM    GFR est non-AA >60 12/12/2015 09:42 AM    Calcium 8.9 12/12/2015 09:42 AM    AST (SGOT) 28 12/12/2015 09:42 AM    Alk. phosphatase 79 12/12/2015 09:42 AM    Protein, total 7.2 12/12/2015 09:42 AM    Albumin 3.9 12/12/2015 09:42 AM    Globulin 3.3 12/12/2015 09:42 AM    A-G Ratio 1.2 12/12/2015 09:42 AM    ALT (SGPT) 47 12/12/2015 09:42 AM             Above results reviewed with patient.    ASSESSMENT:  50 male, non-smoker, commercial sign business owner, h/o obesity, b/l knee arthroscopy (6/16), vit D deficiency, GERD, diverticulitis, allergic rhinitis, lower back pain related to herniated disc 's with plans to undergo laminectomy & fusion. Surgery was deferred related to insurance approval however was also found to have polycythemia, and as such referred to [place]. Review of labs reveals Hgb 18-19 range since 9/13. Hct 55-61 range. Other cell lines are normal. More recently has had some increase in his Cr 1.4-1.6. Recently seen by pulmonology, and diagnosed w moderate sleep apnea, and now on CPAP x 1 month, reports being regular w/ it as makes him sleep better. Notes chronic LE swelling x 3 yeas. Notes having had a detailed cardiac evaluation and was unremarkable.     Polycythemia is likely secondary related to his OSA w and should improve w regular use of CPAP. On once daily baby aspirin (for multiple years). Will r/o primary marrow condition as follows however if secondary polycythemia, then no particular directed therapy will be needed unless Hct over 65%.     09/07/15: Hct stable around 60. Peripheral blood smear w normal morphology. Jak2 mutation analysis -ve. Epo high consistent w secondary polycythemia. Advised to continue CPAP and efforts directed at weight  loss: Hct should improve over time. Continue daily aspirin. Hold phlebotomies unless hct increases over 65. Surgery end December. Will get CBC 1 week prior. Otherwise RTC in 3 months.     12/11/14: S/p back surgery w Dr Raul Del approx 5 weeks ago, recovering well w wound completely healed. Reports regular w CPAP. Back on daily baby aspirin. Hct better/improving. Lt leg swelling x 1 week: will get dopplers r/o DVT.     PLAN:  - serial monitoring of hct: phlebotomy only if hct over 65%  - dopplers LT LE r/o DVT  - daily CPAP, f/u w pulmonology as needed  - continue daily aspirin  - increased creatinine: referred to nephro in past    RTC in 4 months time w routine labs. Decrease frequency of visits thereafter .     I spent over 20 minutes with the patient out of which more than 12 were spent in face to face counseling.      Letha Cape, MD  [facility]  [facility]  [address] [place] [address]  Office : [phone]  Fax : [phone]

## 2015-12-12 NOTE — Telephone Encounter (Signed)
Left voicemail for patient to check mychart message.

## 2015-12-13 ENCOUNTER — Inpatient Hospital Stay: Admit: 2015-12-13 | Payer: PRIVATE HEALTH INSURANCE | Attending: Hematology & Oncology | Primary: Family Medicine

## 2015-12-13 ENCOUNTER — Ambulatory Visit: Primary: Family Medicine

## 2015-12-13 ENCOUNTER — Inpatient Hospital Stay: Admit: 2015-12-13 | Payer: PRIVATE HEALTH INSURANCE | Attending: Nephrology | Primary: Family Medicine

## 2015-12-13 DIAGNOSIS — M7989 Other specified soft tissue disorders: Secondary | ICD-10-CM

## 2015-12-13 DIAGNOSIS — M25561 Pain in right knee: Secondary | ICD-10-CM

## 2015-12-14 ENCOUNTER — Inpatient Hospital Stay: Admit: 2015-12-14 | Payer: PRIVATE HEALTH INSURANCE | Primary: Family Medicine

## 2015-12-14 ENCOUNTER — Encounter

## 2015-12-14 DIAGNOSIS — M47896 Other spondylosis, lumbar region: Secondary | ICD-10-CM

## 2016-01-15 ENCOUNTER — Encounter

## 2016-01-18 ENCOUNTER — Encounter

## 2016-01-18 ENCOUNTER — Inpatient Hospital Stay: Admit: 2016-01-18 | Payer: PRIVATE HEALTH INSURANCE | Primary: Family Medicine

## 2016-01-18 ENCOUNTER — Encounter: Primary: Family Medicine

## 2016-01-18 DIAGNOSIS — M4806 Spinal stenosis, lumbar region: Secondary | ICD-10-CM

## 2016-01-23 ENCOUNTER — Encounter

## 2016-01-23 ENCOUNTER — Ambulatory Visit
Admit: 2016-01-23 | Discharge: 2016-01-23 | Payer: PRIVATE HEALTH INSURANCE | Attending: Family Medicine | Primary: Family Medicine

## 2016-01-23 DIAGNOSIS — J029 Acute pharyngitis, unspecified: Secondary | ICD-10-CM

## 2016-01-23 LAB — AMB POC RAPID INFLUENZA TEST
QuickVue Influenza test: NEGATIVE
QuickVue Influenza test: NEGATIVE

## 2016-01-23 MED ORDER — TESTOSTERONE CYPIONATE 200 MG/ML IM OIL
200 mg/mL | INTRAMUSCULAR | 5 refills | Status: DC
Start: 2016-01-23 — End: 2016-05-31

## 2016-01-23 MED ORDER — AMOXICILLIN 875 MG TAB
875 mg | ORAL_TABLET | Freq: Two times a day (BID) | ORAL | 0 refills | Status: AC
Start: 2016-01-23 — End: 2016-01-31

## 2016-01-23 MED ORDER — METFORMIN 1,000 MG TAB
1000 mg | ORAL_TABLET | ORAL | 3 refills | Status: DC
Start: 2016-01-23 — End: 2016-05-14

## 2016-01-23 NOTE — Telephone Encounter (Signed)
From: Steven DuboisMichael L Lucia  To: Allene DillonLindsey Caston Cecil, MD  Sent: 01/23/2016 10:32 AM EDT  Subject:  Medication Renewal Request    Original  authorizing provider: Allene DillonLindsey Caston Cecil, MD    Steven DuboisMichael  L [name] would like a refill of the following medications:  metFORMIN  (GLUCOPHAGE) 1,000 mg tablet Allene Dillon[[name], MD]    Preferred  pharmacy: [id number] - [place], [place] - [address]    Comment:  I'm  sorry [facility] says they did not get refill for Metformin, but I see you did approve on 12-05-15 (unless it was one month only). I am out and they are supposed to call for refill authorization this morning. Thank you!

## 2016-01-23 NOTE — Progress Notes (Signed)
HISTORY OF PRESENT ILLNESS  Steven DuboisMichael L [name] is a 60 y.o. male.  HPI   Patient complains of cough described as with wheezing and sore throat. Onset of symptoms was 4 days ago, some better/some worse since that time.  Treatment to date: none.  Wife had the flu.   Started with sore throat, nausea on Friday night/sat am. Gradually worse, today is first day sore throat is a little better. Had 2.5 days of amoxicillin which he took at home - gets strep a lot so was worried that was it - felt like it. No HA, but felt feverish. Some body aches. Some cough - feels congested. Slight wheeze at times.    Review of Systems   Constitutional: Positive for malaise/fatigue. Negative for chills and fever.   HENT: Positive for sore throat. Negative for congestion and ear pain.    Eyes: Negative for blurred vision, pain and redness.   Respiratory: Positive for cough. Negative for shortness of breath.    Cardiovascular: Negative for chest pain and palpitations.   Gastrointestinal: Negative for abdominal pain, nausea and vomiting.   Genitourinary: Negative for dysuria.   Musculoskeletal: Positive for myalgias. Negative for back pain.   Skin: Negative for rash.   Neurological: Positive for headaches. Negative for dizziness.   Psychiatric/Behavioral: Negative for depression.     Visit Vitals   ??? BP 143/87 (BP 1 Location: Right arm, BP Patient Position: Sitting)   ??? Pulse 82   ??? Temp 97.8 ??F (36.6 ??C) (Oral)   ??? Ht 5\' 10"  (1.778 m)   ??? Wt 287 lb (130.2 kg)   ??? SpO2 95%   ??? BMI 41.18 kg/m2      Physical Exam   Constitutional: He appears well-developed and well-nourished. No distress.   HENT:   Head: Normocephalic and atraumatic.   Right Ear: Hearing, tympanic membrane, external ear and ear canal normal.   Left Ear: Hearing, tympanic membrane, external ear and ear canal normal.   Nose: Mucosal edema and rhinorrhea present. No sinus tenderness.   Mouth/Throat: Posterior oropharyngeal erythema present.    Cardiovascular: Normal rate, regular rhythm and normal heart sounds.    Pulmonary/Chest: Effort normal and breath sounds normal.   Lymphadenopathy:     He has cervical adenopathy.   Nursing note and vitals reviewed.      ASSESSMENT and PLAN    ICD-10-CM ICD-9-CM    1. Pharyngitis, unspecified etiology J02.9 462    2. Cough R05 786.2 AMB POC RAPID INFLUENZA TEST      AMB POC RAPID INFLUENZA TEST   3. 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 Neg for flu but advised him to monitor closely for this as his wife had it.  Cont course of amoxicillin since he has improved on this - do another 8 days for total of 10 days. RTC or call if not improving

## 2016-02-01 DIAGNOSIS — M545 Low back pain: Secondary | ICD-10-CM

## 2016-02-01 NOTE — Progress Notes (Signed)
Barton DuboisMichael L Gaskill  DOB: 1956/02/11 [facility] [place]  [address], [address], [place] [id number]  Phone:[phone]   Fax:[phone]          OUTPATIENT ORTHOPAEDIC PHYSICAL THERAPY    NAME/AGE/GENDER: Barton DuboisMichael L Mallery is a 60 y.o. male    DATE: 02/01/2016                       Ambulatory/Rehab Services H2 Model Falls Risk Assessment    Risk Factor Pts. ??   Confusion/Disorientation/Impulsivity      4 ??   Symptomatic Depression     2 ??   Altered Elimination     1 ??   Dizziness/Vertigo     1 ??   Gender (Male)     1 ??   Any administered antiepileptics (anticonvulsants):     2 ??   Any administered benzodiazepines:     1 ??   Visual Impairment (specify):     1 ??   Portable Oxygen Use     1 ??   Orthostatic ? BP     1 ??   History of Recent Falls (within 3 mos.)     5     Ability to Rise from Chair (choose one) Pts. ??   Ability to rise in a single movement     0 ??   Pushes up, successful in one attempt     1 ??   Multiple attempts, but unsuccessful     3 ??   Unable to rise without assistance     4   Total: (5 or greater = High Risk) 1     Falls Prevention Plan:                   Physical Limitations to Exercise (specify):                   Mobility Assistance Device (type):                   Exercise/Equipment Adaptation (specify):    ??2010 [employer] All Rights Reserved. [employer]ates Patent 249 459 1887#7,282,031. Federal Law prohibits the replication, distribution or use without written permission from [employer]     Allene DillonJennifer N Yoel [place]auf[place], [place]

## 2016-02-02 ENCOUNTER — Inpatient Hospital Stay: Payer: PRIVATE HEALTH INSURANCE | Primary: Family Medicine

## 2016-02-02 ENCOUNTER — Inpatient Hospital Stay: Admit: 2016-02-02 | Payer: PRIVATE HEALTH INSURANCE | Primary: Family Medicine

## 2016-02-02 NOTE — Progress Notes (Signed)
Steven [name]  DOB: November 06, 1955 [facility]  [address], [address], [place] [address]  [phone]   [phone]            OUTPATIENT PHYSICAL THERAPY:Initial Assessment 02/02/2016    ICD-10: Treatment Diagnosis:  ?? Pain in right knee (M25.561)  ?? Stiffness of right knee, not elsewhere classified (M25.661)  ??  Low back pain (M54.5)  Precautions/Allergies:   Nsaids (non-steroidal anti-inflammatory drug)   Fall Risk Score: 1 (? 5 = High Risk)  MD Orders: Evaluate and Treat MEDICAL/REFERRING DIAGNOSIS:  Back pain [M54.9]  Neck pain [M54.2]   DATE OF ONSET: Chronic; Surgery: 10/21/2015  REFERRING PHYSICIAN: Darl Householder, MD  RETURN PHYSICIAN APPOINTMENT: TBD     INITIAL ASSESSMENT:  Steven [name] presents with decreased mobility, decreased strength, and pain in right knee region secondary to previous pain and surgery.  After discussing with patient, he agreed he would benefit from physical therapy to improve above deficits.  Please sign this plan of treatment if you concur.  Thank you for the opportunity to serve this patient.       PROBLEM LIST (Impacting functional limitations):  1. Decreased Strength  2. Decreased ADL/Functional Activities  3. Decreased Ambulation Ability/Technique  4. Decreased Balance  5. Increased Pain  6. Decreased Activity Tolerance INTERVENTIONS PLANNED:  1. Cold  2. Heat  3. Home Exercise Program (HEP)  4. Manual Therapy  5. Neuromuscular Re-education/Strengthening  6. Range of Motion (ROM)  7. Therapeutic Activites  8. Therapeutic Exercise/Strengthening  9. Transcutaneous Electrical Nerve Stimulation (TENS)   TREATMENT PLAN:  Effective Dates: 02/02/2016 TO 04/03/2016.  Frequency/Duration: 2 times a week for 8 weeks  GOALS: (Goals have been discussed and agreed upon with patient.)  Short-Term Functional Goals: Time Frame: 3 weeks  1. Patient will be independent with home exercise program without exacerbation of symptoms or cueing needed.    2. Patient will be independent with correct sleeping positions and awareness/avoidance of aggravating positions without cueing needed.   Discharge Goals: Time Frame: 8 weeks  1. Patient will be independent with all ADLs with minimal onset of knee and back pain and no deficits with daily tasks.  2. Patient will report no fear avoidance with social or recreational activities due to knee and back pain.  3. Patient will score less than or equal to 60/80 on Lower Extremity Functional Scale with minimal effect of leg and back pain on patient's ability to manage every day life activities.  Rehabilitation Potential For Stated Goals: Good  Regarding Steven [name]'s therapy, I certify that the treatment plan above will be carried out by a therapist or under their direction.  Thank you for this referral,  Steven [name], PT     Referring Physician Signature: Steven Householder, MD              Date                    The information in this section was collected on 02/02/2016 (except where otherwise noted).  HISTORY:   History of Present Injury/Illness (Reason for Referral):  Patient reports he had surgery on his knees in May and his back in December of 2016.  He reports he has been doing well with trying to stay mobile and active.  He reports he has been wearing his back brace (per his surgeon) until recently. He reports his knees have been doing really well.  He reports, however,  lately he has been having some pain/discomfort in his right knee, which he reports was the more painful knee from the beginning.  He reports he was doing well after he had surgery and was doing therapy, but he had to stop because of his back surgery and per that surgeon. He reports he would like to get back to therapy to get his knees back to feeling better and stronger.  Past Medical History/Comorbidities:   Steven [name]  has a past medical history of Allergic rhinitis, cause unspecified; BMI 40.0-44.9, adult (HCC); Chronic left sacroiliac joint  pain; Chronic radicular low back pain; Diverticulitis; Essential hypertension, benign; Generalized anxiety disorder; GERD (gastroesophageal reflux disease); Hypercholesterolemia; Left lumbar radiculitis; Lumbar stenosis with neurogenic claudication (11/06/2015); Mood disorder (HCC) (07/14/2014); Morbid obesity (HCC); OSA on CPAP; Polycythemia (HCC) (dx--06/2015); Prediabetes; Spondylolisthesis at L4-L5 level (10/19/2015); Testicular hypofunction; Venous insufficiency of both lower extremities (07/14/2014); and Vitamin D deficiency.  Steven [name]  has a past surgical history that includes endoscopy, colon, diagnostic (2011); knee arthroscopy (Bilateral, 04/2015); colonoscopy (05/2015); and orthopaedic (Left, 10/19/2015).  Social History/Living Environment:   Home Environment: Private residence  Living Alone: No  Support Systems: Family member(s), Friends \\ neighbors  Prior Level of Function/Work/Activity:  Independent  Dominant Side:         RIGHT  Previous Treatment Approaches:          Previous PT following s/p meniscal repair  Personal Factors:          Sex:  male        Age:  60 y.o.        Profession:  Advertising  Current Medications:       Current Outpatient Prescriptions:   ???  metFORMIN (GLUCOPHAGE) 1,000 mg tablet, TAKE ONE TABLET BY MOUTH TWICE A DAY, Disp: 60 Tab, Rfl: 3  ???  testosterone cypionate (DEPOTESTOTERONE CYPIONATE) 200 mg/mL injection, INJECT 1/2 MILLILITER EVERY 7 DAYS, Disp: 2 mL, Rfl: 5  ???  valsartan-hydroCHLOROthiazide (DIOVAN-HCT) 320-12.5 mg per tablet, TAKE 1 TABLET BY MOUTH EVERY DAY, Disp: 30 Tab, Rfl: 5  ???  metoprolol succinate (TOPROL-XL) 100 mg tablet, Take 1 Tab by mouth nightly. Indications: hypertension, Disp: 30 Tab, Rfl: 5  ???  pravastatin (PRAVACHOL) 40 mg tablet, TAKE 1 TABLET BY MOUTH AT BEDTIME, Disp: 30 Tab, Rfl: 5  ???  ergocalciferol (VITAMIN D2) 50,000 unit capsule, TAKE 1 CAP BY MOUTH EVERY SEVEN (7) DAYS. INDICATIONS: VITAMIN D DEFICIENCY---every wednesday, Disp: 4 Cap, Rfl: 0   ???  aspirin (ASPIRIN LOW-STRENGTH) 81 mg chewable tablet, Take 81 mg by mouth every morning. Indications: continue, Disp: , Rfl:   ???  MULTI-VITAMIN HI-PO PO, Take 2 Tabs by mouth every morning. Indications: hold until after surgery, Disp: , Rfl:    Date Last Reviewed:  02/02/2016    Number of Personal Factors/Comorbidities that affect the Plan of Care: 1-2: MODERATE COMPLEXITY   EXAMINATION:   Observation/Orthostatic Postural Assessment:          Posture Assessment: Rounded shoulders, Forward head   Palpation:          Milt tenderness to palpation noted along right knee near previous surgical scars.  No bruising noted.  Minimal swelling noted.    ROM: R LE Assessment (AROM):   ?? Hip Flexion: 80 degrees    ?? Hip Extension: 5 degrees    ?? Knee Flexion: 110 degrees    ?? Knee Extension: 6 degrees     Strength:  R LE Assessment (Strength):   ??  Hip Flexion: 4/5 with manual muscle testing    ?? Hip Extension: 4/5 with manual muscle testing    ?? Knee Flexion: 3+/5 with manual muscle testing    ?? Knee Extension: 3+/5 with manual muscle testing     Special Tests:   Negative sit/slump test.  Negative Marcher's test.  Negative forward flexion test.  Negative anterior drawer test for ACL involvement. Negative Medial-Lateral Grind test for possible meniscal involvement in left lower extremity.  Negative slocum test with rotation for ACL, MCL or capsular involvement.  Negative Posterior Drawer test for PCL involvement.  Negative posteromedial drawer test for PCL, MCL or capsular involvement.  Negative posterolateral drawer test for PCL, LCL or capsular involvement.      Neurological Screen:        Dermatomes:  Within normal limits.        Reflexes:  2+  Functional Mobility:         Gait/Ambulation:     Base of Support: Center of gravity altered  Speed/Cadence: Pace decreased (<100 feet/min)  Step Length: Right shortened, Left shortened  Swing Pattern: Right asymmetrical, Left asymmetrical  Gait Abnormalities: Antalgic          Transfers:  Independent   Body Structures Involved:  1. Joints  2. Muscles Body Functions Affected:  1. Movement Related Activities and Participation Affected:  1. Mobility  2. Self Care   Number of elements (examined above) that affect the Plan of Care: 4+: HIGH COMPLEXITY   CLINICAL PRESENTATION:   Presentation: Evolving clinical presentation with changing clinical characteristics: MODERATE COMPLEXITY   CLINICAL DECISION MAKING:   Outcome Measure: Tool Used: Lower Extremity Functional Scale (LEFS)  Score:  Initial: 46/80 Most Recent: X/80 (Date: -- )   Interpretation of Score: 20 questions each scored on a 5 point scale with 0 representing "extreme difficulty or unable to perform" and 4 representing "no difficulty".  The lower the score, the greater the functional disability. 80/80 represents no disability.  Minimal detectable change is 9 points.  Score 80 79-63 62-48 47-32 31-16 15-1 0   Modifier CH CI CJ CK CL CM CN     Medical Necessity:   ?? Patient is expected to demonstrate progress in strength and range of motion to increase independence with daily activities.  ?? Patient demonstrates good rehab potential due to higher previous functional level.  ?? Skilled intervention continues to be required due to decreased strength and mobility.  Reason for Services/Other Comments:  ?? Patient continues to demonstrate capacity to improve overall mobility which will increase independence and increase safety.   Use of outcome tool(s) and clinical judgement create a POC that gives a: Questionable prediction of patient's progress: MODERATE COMPLEXITY            TREATMENT:   (In addition to Assessment/Re-Assessment sessions the following treatments were rendered)  Pre-treatment Symptoms/Complaints:  02/02/2016: Patient reports his knee is still a little painful today after yesterday.  Pain: Initial:   Pain Intensity 1: 5  Pain Location 1: Knee  Pain Orientation 1: Right   Pain Intervention(s) 1: Rest, Medication (see MAR), Exercise  Post Session:  4/10     THERAPEUTIC EXERCISE: (10 minutes):  Exercises per grid below to improve mobility and strength.  Required minimal verbal and manual cues to promote proper body alignment, promote proper body posture and promote proper body mechanics.  Progressed resistance, range, repetitions and complexity of movement as indicated.   Date:  02/02/2016   Activity/Exercise Parameters  Long arc quad HEP   Straight leg raise HEP   Quad sets HEP   Heel slides HEP        Treatment/Session Assessment:    ?? Response to Treatment:  Patient tolerated assessment without complaints of increased pain.  Patient verbalized and demonstrated understanding of HEP.  ?? Compliance with Program/Exercises: Will assess as treatment progresses.  ?? Recommendations/Intent for next treatment session: "Next visit will focus on advancements to more challenging activities": Airdyne, calf stretch, add weights (2 pounds) to LE activities.  Total Treatment Duration:  PT Patient Time In/Time Out  Time In: 0830  Time Out: 0930    Steven [name], PT

## 2016-02-06 ENCOUNTER — Inpatient Hospital Stay: Admit: 2016-02-06 | Payer: PRIVATE HEALTH INSURANCE | Primary: Family Medicine

## 2016-02-06 NOTE — Progress Notes (Signed)
OSBORN PULLIN  DOB: Mar 17, 1956 [facility] at [facility]  [address], [place] [address]  Phone:[phone]   Fax:[phone]            OUTPATIENT PHYSICAL THERAPY:Daily Note 02/06/2016    ICD-10: Treatment Diagnosis:  ?? Pain in right knee (M25.561)  ?? Stiffness of right knee, not elsewhere classified (M25.661)  ??  Low back pain (M54.5)  Precautions/Allergies:   Nsaids (non-steroidal anti-inflammatory drug)   Fall Risk Score: 1 (? 5 = High Risk)  MD Orders: Evaluate and Treat MEDICAL/REFERRING DIAGNOSIS:  Back pain [M54.9]  Neck pain [M54.2]   DATE OF ONSET: Chronic; Surgery: 10/21/2015  REFERRING PHYSICIAN: Darl Householder, MD  RETURN PHYSICIAN APPOINTMENT: TBD     INITIAL ASSESSMENT:  Mr. Batson presents with decreased mobility, decreased strength, and pain in right knee region secondary to previous pain and surgery.  After discussing with patient, he agreed he would benefit from physical therapy to improve above deficits.  Please sign this plan of treatment if you concur.  Thank you for the opportunity to serve this patient.       PROBLEM LIST (Impacting functional limitations):  1. Decreased Strength  2. Decreased ADL/Functional Activities  3. Decreased Ambulation Ability/Technique  4. Decreased Balance  5. Increased Pain  6. Decreased Activity Tolerance INTERVENTIONS PLANNED:  1. Cold  2. Heat  3. Home Exercise Program (HEP)  4. Manual Therapy  5. Neuromuscular Re-education/Strengthening  6. Range of Motion (ROM)  7. Therapeutic Activites  8. Therapeutic Exercise/Strengthening  9. Transcutaneous Electrical Nerve Stimulation (TENS)   TREATMENT PLAN:  Effective Dates: 02/02/2016 TO 04/03/2016.  Frequency/Duration: 2 times a week for 8 weeks  GOALS: (Goals have been discussed and agreed upon with patient.)  Short-Term Functional Goals: Time Frame: 3 weeks  1. Patient will be independent with home exercise program without exacerbation of symptoms or cueing needed.    2. Patient will be independent with correct sleeping positions and awareness/avoidance of aggravating positions without cueing needed.   Discharge Goals: Time Frame: 8 weeks  1. Patient will be independent with all ADLs with minimal onset of knee and back pain and no deficits with daily tasks.  2. Patient will report no fear avoidance with social or recreational activities due to knee and back pain.  3. Patient will score less than or equal to 60/80 on Lower Extremity Functional Scale with minimal effect of leg and back pain on patient's ability to manage every day life activities.  Rehabilitation Potential For Stated Goals: Good  Regarding Kielan Dreisbach Sine's therapy, I certify that the treatment plan above will be carried out by a therapist or under their direction.  Thank you for this referral,  Juanna Cao, PT     Referring Physician Signature: Darl Householder, MD              Date                    The information in this section was collected on 02/02/2016 (except where otherwise noted).  HISTORY:   History of Present Injury/Illness (Reason for Referral):  Patient reports he had surgery on his knees in May and his back in December of 2016.  He reports he has been doing well with trying to stay mobile and active.  He reports he has been wearing his back brace (per his surgeon) until recently. He reports his knees have been doing really well.  He reports, however,  lately he has been having some pain/discomfort in his right knee, which he reports was the more painful knee from the beginning.  He reports he was doing well after he had surgery and was doing therapy, but he had to stop because of his back surgery and per that surgeon. He reports he would like to get back to therapy to get his knees back to feeling better and stronger.  Past Medical History/Comorbidities:   [name]  has a past medical history of Allergic rhinitis, cause unspecified; BMI 40.0-44.9, adult (HCC); Chronic left sacroiliac joint  pain; Chronic radicular low back pain; Diverticulitis; Essential hypertension, benign; Generalized anxiety disorder; GERD (gastroesophageal reflux disease); Hypercholesterolemia; Left lumbar radiculitis; Lumbar stenosis with neurogenic claudication (11/06/2015); Mood disorder (HCC) (07/14/2014); Morbid obesity (HCC); OSA on CPAP; Polycythemia (HCC) (dx--06/2015); Prediabetes; Spondylolisthesis at L4-L5 level (10/19/2015); Testicular hypofunction; Venous insufficiency of both lower extremities (07/14/2014); and Vitamin D deficiency.  [name]  has a past surgical history that includes endoscopy, colon, diagnostic (2011); knee arthroscopy (Bilateral, 04/2015); colonoscopy (05/2015); and orthopaedic (Left, 10/19/2015).  Social History/Living Environment:   Home Environment: Private residence  Living Alone: No  Support Systems: Family member(s), Friends \\ neighbors  Prior Level of Function/Work/Activity:  Independent  Dominant Side:         RIGHT  Previous Treatment Approaches:          Previous PT following s/p meniscal repair  Personal Factors:          Sex:  male        Age:  60 y.o.        Profession:  Advertising  Current Medications:       Current Outpatient Prescriptions:   ???  metFORMIN (GLUCOPHAGE) 1,000 mg tablet, TAKE ONE TABLET BY MOUTH TWICE A DAY, Disp: 60 Tab, Rfl: 3  ???  testosterone cypionate (DEPOTESTOTERONE CYPIONATE) 200 mg/mL injection, INJECT 1/2 MILLILITER EVERY 7 DAYS, Disp: 2 mL, Rfl: 5  ???  valsartan-hydroCHLOROthiazide (DIOVAN-HCT) 320-12.5 mg per tablet, TAKE 1 TABLET BY MOUTH EVERY DAY, Disp: 30 Tab, Rfl: 5  ???  metoprolol succinate (TOPROL-XL) 100 mg tablet, Take 1 Tab by mouth nightly. Indications: hypertension, Disp: 30 Tab, Rfl: 5  ???  pravastatin (PRAVACHOL) 40 mg tablet, TAKE 1 TABLET BY MOUTH AT BEDTIME, Disp: 30 Tab, Rfl: 5  ???  ergocalciferol (VITAMIN D2) 50,000 unit capsule, TAKE 1 CAP BY MOUTH EVERY SEVEN (7) DAYS. INDICATIONS: VITAMIN D DEFICIENCY---every wednesday, Disp: 4 Cap, Rfl: 0   ???  aspirin (ASPIRIN LOW-STRENGTH) 81 mg chewable tablet, Take 81 mg by mouth every morning. Indications: continue, Disp: , Rfl:   ???  MULTI-VITAMIN HI-PO PO, Take 2 Tabs by mouth every morning. Indications: hold until after surgery, Disp: , Rfl:    Date Last Reviewed:  02/06/2016    Number of Personal Factors/Comorbidities that affect the Plan of Care: 1-2: MODERATE COMPLEXITY   EXAMINATION:   Observation/Orthostatic Postural Assessment:          Posture Assessment: Rounded shoulders, Forward head   Palpation:          Milt tenderness to palpation noted along right knee near previous surgical scars.  No bruising noted.  Minimal swelling noted.    ROM: R LE Assessment (AROM):   ?? Hip Flexion: 80 degrees    ?? Hip Extension: 5 degrees    ?? Knee Flexion: 110 degrees    ?? Knee Extension: 6 degrees     Strength:  R LE Assessment (Strength):   ??  Hip Flexion: 4/5 with manual muscle testing    ?? Hip Extension: 4/5 with manual muscle testing    ?? Knee Flexion: 3+/5 with manual muscle testing    ?? Knee Extension: 3+/5 with manual muscle testing     Special Tests:   Negative sit/slump test.  Negative Marcher's test.  Negative forward flexion test.  Negative anterior drawer test for ACL involvement. Negative Medial-Lateral Grind test for possible meniscal involvement in left lower extremity.  Negative slocum test with rotation for ACL, MCL or capsular involvement.  Negative Posterior Drawer test for PCL involvement.  Negative posteromedial drawer test for PCL, MCL or capsular involvement.  Negative posterolateral drawer test for PCL, LCL or capsular involvement.      Neurological Screen:        Dermatomes:  Within normal limits.        Reflexes:  2+  Functional Mobility:         Gait/Ambulation:     Base of Support: Center of gravity altered  Speed/Cadence: Pace decreased (<100 feet/min)  Step Length: Right shortened, Left shortened  Swing Pattern: Right asymmetrical, Left asymmetrical  Gait Abnormalities: Antalgic          Transfers:  Independent   Body Structures Involved:  1. Joints  2. Muscles Body Functions Affected:  1. Movement Related Activities and Participation Affected:  1. Mobility  2. Self Care   Number of elements (examined above) that affect the Plan of Care: 4+: HIGH COMPLEXITY   CLINICAL PRESENTATION:   Presentation: Evolving clinical presentation with changing clinical characteristics: MODERATE COMPLEXITY   CLINICAL DECISION MAKING:   Outcome Measure: Tool Used: Lower Extremity Functional Scale (LEFS)  Score:  Initial: 46/80 Most Recent: X/80 (Date: -- )   Interpretation of Score: 20 questions each scored on a 5 point scale with 0 representing "extreme difficulty or unable to perform" and 4 representing "no difficulty".  The lower the score, the greater the functional disability. 80/80 represents no disability.  Minimal detectable change is 9 points.  Score 80 79-63 62-48 47-32 31-16 15-1 0   Modifier CH CI CJ CK CL CM CN     Medical Necessity:   ?? Patient is expected to demonstrate progress in strength and range of motion to increase independence with daily activities.  ?? Patient demonstrates good rehab potential due to higher previous functional level.  ?? Skilled intervention continues to be required due to decreased strength and mobility.  Reason for Services/Other Comments:  ?? Patient continues to demonstrate capacity to improve overall mobility which will increase independence and increase safety.   Use of outcome tool(s) and clinical judgement create a POC that gives a: Questionable prediction of patient's progress: MODERATE COMPLEXITY            TREATMENT:   (In addition to Assessment/Re-Assessment sessions the following treatments were rendered)  Pre-treatment Symptoms/Complaints:  02/06/2016: Patient reports his knee is still a little painful today after yesterday.  Pain: Initial:      Post Session:  4/10     THERAPEUTIC EXERCISE: (35 minutes):  Exercises per grid below to improve  mobility and strength.  Required minimal verbal and manual cues to promote proper body alignment, promote proper body posture and promote proper body mechanics.  Progressed resistance, range, repetitions and complexity of movement as indicated.   Date:  02/02/2016 Date  02/06/16      Activity/Exercise Parameters       Long arc quad HEP 2# x20  Straight leg raise HEP 2# x20      Quad sets HEP x20      Heel slides HEP x20      Standing march  x20      steps  4 inch step ups, step down x20 each      Single leg balance  10sx6      Calf stretch  Incline 15sx2      Airdyne  8 min          MODALITIES: (10 minutes): ice R knee for swelling/ soreness  Treatment/Session Assessment:    ?? Response to Treatment:  Patient tolerated assessment without complaints of increased pain.  Patient verbalized and demonstrated understanding of HEP.  ?? Compliance with Program/Exercises: Will assess as treatment progresses.  ?? Recommendations/Intent for next treatment session: "Next visit will focus on advancements to more challenging activities": Airdyne, calf stretch, add weights (2 pounds) to LE activities.  Total Treatment Duration:       Juanna Cao, PT

## 2016-02-08 ENCOUNTER — Encounter: Payer: PRIVATE HEALTH INSURANCE | Primary: Family Medicine

## 2016-02-10 ENCOUNTER — Inpatient Hospital Stay: Admit: 2016-02-10 | Payer: PRIVATE HEALTH INSURANCE | Primary: Family Medicine

## 2016-02-10 NOTE — Progress Notes (Addendum)
ASHAAD GAERTNER  DOB: 11-16-1955 [facility]  [address], [address], [place] [address]  [phone]   Fax:[phone]            OUTPATIENT PHYSICAL THERAPY:Daily Note 02/10/2016    ICD-10: Treatment Diagnosis:  ?? Pain in right knee (M25.561)  ?? Stiffness of right knee, not elsewhere classified (M25.661)  ??  Low back pain (M54.5)  Precautions/Allergies:   Nsaids (non-steroidal anti-inflammatory drug)   Fall Risk Score: 1 (? 5 = High Risk)  MD Orders: Evaluate and Treat MEDICAL/REFERRING DIAGNOSIS:  Back pain [M54.9]  Neck pain [M54.2]   DATE OF ONSET: Chronic; Surgery: 10/21/2015  REFERRING PHYSICIAN: Darl Householder, MD  RETURN PHYSICIAN APPOINTMENT: TBD     INITIAL ASSESSMENT:  Mr. Slager presents with decreased mobility, decreased strength, and pain in right knee region secondary to previous pain and surgery.  Patient has attended a total of 2 scheduled physical therapy visits including initial evaluation on 02/02/2016.  Treatment has consisted of strengthening, stretching, and modalities to improve overall pain and performance with activities of daily living.       PROBLEM LIST (Impacting functional limitations):  1. Decreased Strength  2. Decreased ADL/Functional Activities  3. Decreased Ambulation Ability/Technique  4. Decreased Balance  5. Increased Pain  6. Decreased Activity Tolerance INTERVENTIONS PLANNED:  1. Cold  2. Heat  3. Home Exercise Program (HEP)  4. Manual Therapy  5. Neuromuscular Re-education/Strengthening  6. Range of Motion (ROM)  7. Therapeutic Activites  8. Therapeutic Exercise/Strengthening  9. Transcutaneous Electrical Nerve Stimulation (TENS)   TREATMENT PLAN:  Effective Dates: 02/02/2016 TO 04/03/2016.  Frequency/Duration: 2 times a week for 8 weeks  GOALS: (Goals have been discussed and agreed upon with patient.)  Short-Term Functional Goals: Time Frame: 3 weeks  1. Patient will be independent with home exercise program without  exacerbation of symptoms or cueing needed.   2. Patient will be independent with correct sleeping positions and awareness/avoidance of aggravating positions without cueing needed.   Discharge Goals: Time Frame: 8 weeks  1. Patient will be independent with all ADLs with minimal onset of knee and back pain and no deficits with daily tasks.  2. Patient will report no fear avoidance with social or recreational activities due to knee and back pain.  3. Patient will score less than or equal to 60/80 on Lower Extremity Functional Scale with minimal effect of leg and back pain on patient's ability to manage every day life activities.  Rehabilitation Potential For Stated Goals: Good            The information in this section was collected on 02/02/2016 (except where otherwise noted).  HISTORY:   History of Present Injury/Illness (Reason for Referral):  Patient reports he had surgery on his knees in May and his back in December of 2016.  He reports he has been doing well with trying to stay mobile and active.  He reports he has been wearing his back brace (per his surgeon) until recently. He reports his knees have been doing really well.  He reports, however, lately he has been having some pain/discomfort in his right knee, which he reports was the more painful knee from the beginning.  He reports he was doing well after he had surgery and was doing therapy, but he had to stop because of his back surgery and per that surgeon. He reports he would like to get back to therapy to get his knees back to feeling  better and stronger.  Past Medical History/Comorbidities:   Mr. Sheaffer  has a past medical history of Allergic rhinitis, cause unspecified; BMI 40.0-44.9, adult (HCC); Chronic left sacroiliac joint pain; Chronic radicular low back pain; Diverticulitis; Essential hypertension, benign; Generalized anxiety disorder; GERD (gastroesophageal reflux disease); Hypercholesterolemia; Left lumbar radiculitis; Lumbar  stenosis with neurogenic claudication (11/06/2015); Mood disorder (HCC) (07/14/2014); Morbid obesity (HCC); OSA on CPAP; Polycythemia (HCC) (dx--06/2015); Prediabetes; Spondylolisthesis at L4-L5 level (10/19/2015); Testicular hypofunction; Venous insufficiency of both lower extremities (07/14/2014); and Vitamin D deficiency.  Mr. Kanner  has a past surgical history that includes endoscopy, colon, diagnostic (2011); knee arthroscopy (Bilateral, 04/2015); colonoscopy (05/2015); and orthopaedic (Left, 10/19/2015).  Social History/Living Environment:   Home Environment: Private residence  Living Alone: No  Support Systems: Family member(s), Friends \\ neighbors  Prior Level of Function/Work/Activity:  Independent  Dominant Side:         RIGHT  Previous Treatment Approaches:          Previous PT following s/p meniscal repair  Personal Factors:          Sex:  male        Age:  60 y.o.        Profession:  Advertising  Current Medications:       Current Outpatient Prescriptions:   ???  metFORMIN (GLUCOPHAGE) 1,000 mg tablet, TAKE ONE TABLET BY MOUTH TWICE A DAY, Disp: 60 Tab, Rfl: 3  ???  testosterone cypionate (DEPOTESTOTERONE CYPIONATE) 200 mg/mL injection, INJECT 1/2 MILLILITER EVERY 7 DAYS, Disp: 2 mL, Rfl: 5  ???  valsartan-hydroCHLOROthiazide (DIOVAN-HCT) 320-12.5 mg per tablet, TAKE 1 TABLET BY MOUTH EVERY DAY, Disp: 30 Tab, Rfl: 5  ???  metoprolol succinate (TOPROL-XL) 100 mg tablet, Take 1 Tab by mouth nightly. Indications: hypertension, Disp: 30 Tab, Rfl: 5  ???  pravastatin (PRAVACHOL) 40 mg tablet, TAKE 1 TABLET BY MOUTH AT BEDTIME, Disp: 30 Tab, Rfl: 5  ???  ergocalciferol (VITAMIN D2) 50,000 unit capsule, TAKE 1 CAP BY MOUTH EVERY SEVEN (7) DAYS. INDICATIONS: VITAMIN D DEFICIENCY---every wednesday, Disp: 4 Cap, Rfl: 0  ???  aspirin (ASPIRIN LOW-STRENGTH) 81 mg chewable tablet, Take 81 mg by mouth every morning. Indications: continue, Disp: , Rfl:   ???  MULTI-VITAMIN HI-PO PO, Take 2 Tabs by mouth every morning.  Indications: hold until after surgery, Disp: , Rfl:    Date Last Reviewed:  02/10/2016    Number of Personal Factors/Comorbidities that affect the Plan of Care: 1-2: MODERATE COMPLEXITY   EXAMINATION:   Observation/Orthostatic Postural Assessment:          Posture Assessment: Rounded shoulders, Forward head   Palpation:          Milt tenderness to palpation noted along right knee near previous surgical scars.  No bruising noted.  Minimal swelling noted.    ROM: R LE Assessment (AROM):   ?? Hip Flexion: 80 degrees    ?? Hip Extension: 5 degrees    ?? Knee Flexion: 110 degrees    ?? Knee Extension: 6 degrees     Strength:  R LE Assessment (Strength):   ?? Hip Flexion: 4/5 with manual muscle testing    ?? Hip Extension: 4/5 with manual muscle testing    ?? Knee Flexion: 3+/5 with manual muscle testing    ?? Knee Extension: 3+/5 with manual muscle testing     Special Tests:   Negative sit/slump test.  Negative Marcher's test.  Negative forward flexion test.  Negative anterior drawer test for ACL  involvement. Negative Medial-Lateral Grind test for possible meniscal involvement in left lower extremity.  Negative slocum test with rotation for ACL, MCL or capsular involvement.  Negative Posterior Drawer test for PCL involvement.  Negative posteromedial drawer test for PCL, MCL or capsular involvement.  Negative posterolateral drawer test for PCL, LCL or capsular involvement.      Neurological Screen:        Dermatomes:  Within normal limits.        Reflexes:  2+  Functional Mobility:         Gait/Ambulation:     Base of Support: Center of gravity altered  Speed/Cadence: Pace decreased (<100 feet/min)  Step Length: Right shortened, Left shortened  Swing Pattern: Right asymmetrical, Left asymmetrical  Gait Abnormalities: Antalgic         Transfers:  Independent   Body Structures Involved:  1. Joints  2. Muscles Body Functions Affected:  1. Movement Related Activities and Participation Affected:  1. Mobility  2. Self Care    Number of elements (examined above) that affect the Plan of Care: 4+: HIGH COMPLEXITY   CLINICAL PRESENTATION:   Presentation: Evolving clinical presentation with changing clinical characteristics: MODERATE COMPLEXITY   CLINICAL DECISION MAKING:   Outcome Measure: Tool Used: Lower Extremity Functional Scale (LEFS)  Score:  Initial: 46/80 Most Recent: X/80 (Date: -- )   Interpretation of Score: 20 questions each scored on a 5 point scale with 0 representing "extreme difficulty or unable to perform" and 4 representing "no difficulty".  The lower the score, the greater the functional disability. 80/80 represents no disability.  Minimal detectable change is 9 points.  Score 80 79-63 62-48 47-32 31-16 15-1 0   Modifier CH CI CJ CK CL CM CN     Medical Necessity:   ?? Patient is expected to demonstrate progress in strength and range of motion to increase independence with daily activities.  ?? Patient demonstrates good rehab potential due to higher previous functional level.  ?? Skilled intervention continues to be required due to decreased strength and mobility.  Reason for Services/Other Comments:  ?? Patient continues to demonstrate capacity to improve overall mobility which will increase independence and increase safety.   Use of outcome tool(s) and clinical judgement create a POC that gives a: Questionable prediction of patient's progress: MODERATE COMPLEXITY            TREATMENT:   (In addition to Assessment/Re-Assessment sessions the following treatments were rendered)  Pre-treatment Symptoms/Complaints:  02/10/2016: Patient reports he feels a little stiff today.  Pain: Initial:   Pain Intensity 1: 5  Pain Location 1: Knee  Pain Orientation 1: Right  Pain Intervention(s) 1: Rest, Medication (see MAR)  Post Session:  4/10     THERAPEUTIC EXERCISE: (40 minutes):  Exercises per grid below to improve mobility and strength.  Required minimal verbal and manual cues to promote  proper body alignment, promote proper body posture and promote proper body mechanics.  Progressed resistance, range, repetitions and complexity of movement as indicated.   Date:  02/10/2016   Activity/Exercise Parameters   Airdyne 8 minutes   Long arc quad 2 pounds  20 reps   Straight leg raise 2 pounds  20 reps   Bridging 20 reps   Active hamstring stretch 5 second hold  5 reps  B LE   Step ups 4 inch  20 reps   Step down 4 inch  20 reps   Single leg balance 10 second hold  6 reps  Blue foam   Calf stretch Slant board  15 second hold  2 reps      Patient declined cold pack today.   Treatment/Session Assessment:    ?? Response to Treatment:  Patient tolerated treatment without complaints of increased pain.  Patient able to complete all exercises.  ?? Compliance with Program/Exercises: Will assess as treatment progresses.  ?? Recommendations/Intent for next treatment session: "Next visit will focus on advancements to more challenging activities".  Total Treatment Duration:  PT Patient Time In/Time Out  Time In: 1030  Time Out: 1115    Allene DillonJennifer N Hamza Empson, PT

## 2016-02-13 ENCOUNTER — Ambulatory Visit
Admit: 2016-02-13 | Discharge: 2016-02-13 | Payer: PRIVATE HEALTH INSURANCE | Attending: Family Medicine | Primary: Family Medicine

## 2016-02-13 DIAGNOSIS — Z0000 Encounter for general adult medical examination without abnormal findings: Secondary | ICD-10-CM

## 2016-02-13 MED ORDER — TRAZODONE 50 MG TAB
50 mg | ORAL_TABLET | Freq: Every evening | ORAL | 1 refills | Status: DC
Start: 2016-02-13 — End: 2016-05-31

## 2016-02-13 NOTE — Progress Notes (Signed)
Patient called and cx appointmnent-sick.  [name] PTA

## 2016-02-13 NOTE — Progress Notes (Signed)
HISTORY OF PRESENT ILLNESS  Steven [name] is a 60 y.o. male.  HPI   The patient is a 60 y.o. male who is seen for a comprehensive physical exam.The patient reports no problems. Health behaviors: not attempting to follow a low fat, low cholesterol diet.  BP a little higher today, has not had any caffeine. Says it has been mostly 140's at other offices (for back stuff). He just got release to do some PT after back surgery so hopes to work on weight loss.     Says he is still having trouble sleeping since surgery. Has energy at night, sleepy during day. Tried melatonin 2 tabs, helps some. Says CPAP still working well.    Masses on right knuckles only, been since his 20's. Not painful. 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    Review of Systems   Constitutional: Negative for chills, fever, malaise/fatigue and weight loss.   HENT: Negative for congestion, ear pain and sore throat.    Eyes: Negative for blurred vision and pain.   Respiratory: Negative for cough, shortness of breath and wheezing.    Cardiovascular: Negative for chest pain, palpitations and leg swelling.   Gastrointestinal: Negative for abdominal pain, blood in stool, constipation, diarrhea, heartburn, melena, nausea and vomiting.   Genitourinary: Negative for dysuria, frequency and urgency.   Musculoskeletal: Negative for joint pain and myalgias.   Skin: Negative for rash.   Neurological: Negative for dizziness, sensory change, focal weakness, weakness and headaches.   Endo/Heme/Allergies: Does not bruise/bleed easily.   Psychiatric/Behavioral: Negative for depression. The patient has insomnia. The patient is not nervous/anxious.    All other systems reviewed and are negative.    Visit Vitals   ??? BP 157/82 (BP 1 Location: Right arm, BP Patient Position: Sitting)   ??? Ht  (1.778 m)   ??? Wt 286 lb (129.7 kg)   ??? SpO2 (!) 82%   ??? BMI 41.04 kg/m2      Physical Exam   Constitutional: He is oriented to person, place, and time. He appears  well-developed and well-nourished. No distress.   HENT:   Head: Normocephalic and atraumatic.   Right Ear: External ear normal.   Left Ear: External ear normal.   Nose: Nose normal.   Mouth/Throat: Oropharynx is clear and moist.   Eyes: Conjunctivae and EOM are normal. Pupils are equal, round, and reactive to light.   Neck: Normal range of motion. Neck supple. No thyromegaly present.   Cardiovascular: Normal rate, regular rhythm and intact distal pulses.  Exam reveals no gallop and no friction rub.    No murmur heard.  Pulmonary/Chest: Effort normal and breath sounds normal. He has no wheezes.   Abdominal: Soft. Bowel sounds are normal. He exhibits no mass. There is no hepatosplenomegaly. There is no tenderness. No hernia. Hernia confirmed negative in the right inguinal area and confirmed negative in the left inguinal area.   Genitourinary: Prostate normal, testes normal and penis normal. No discharge found.   Musculoskeletal: Normal range of motion. He exhibits no edema or tenderness.        Right hand: He exhibits normal range of motion and no tenderness.   Small soft, mobile subcutaneous nodules (with some overlying dry skin) on PIPs of right fingers   Neurological: He is alert and oriented to person, place, and time. He has normal reflexes.   Skin: Skin is warm and dry. No rash noted.   Psychiatric: He has a normal mood and affect.  His behavior is normal. Judgment and thought content normal.   Nursing note and vitals reviewed.      ASSESSMENT and PLAN    ICD-10-CM ICD-9-CM    1. Routine general medical examination at a health care facility Z00.00 V70.0    2. Transient insomnia F51.02 307.41 traZODone (DESYREL) 50 mg tablet   labs already done in Feb, prostate exam done today. EKG up to date.   BP slightly more elevated today, feel we can get it to goal with diet/exercise but will have him RTC 1 mos for BP recheck w/ nurse to make sure improving.  If not will increase meds.   Trial of trazodone prn insomnia, if not improving however may need to do re-eval of CPAP. 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 If changes let us know.

## 2016-02-14 ENCOUNTER — Inpatient Hospital Stay: Payer: PRIVATE HEALTH INSURANCE | Primary: Family Medicine

## 2016-02-15 ENCOUNTER — Inpatient Hospital Stay: Admit: 2016-02-15 | Payer: PRIVATE HEALTH INSURANCE | Primary: Family Medicine

## 2016-02-15 NOTE — Progress Notes (Signed)
Steven [name]  DOB: 02-Mar-1956 [facility]  [address], [address], [place] [address]  Phone:[phone]   [phone]            OUTPATIENT PHYSICAL THERAPY:Daily Note 02/15/2016    ICD-10: Treatment Diagnosis:  ?? Pain in right knee (M25.561)  ?? Stiffness of right knee, not elsewhere classified (M25.661)  ??  Low back pain (M54.5)  Precautions/Allergies:   Nsaids (non-steroidal anti-inflammatory drug)   Fall Risk Score: 1 (? 5 = High Risk)  MD Orders: Evaluate and Treat MEDICAL/REFERRING DIAGNOSIS:  Back pain [M54.9]  Neck pain [M54.2]   DATE OF ONSET: Chronic; Surgery: 10/21/2015  REFERRING PHYSICIAN: Darl Householder, MD  RETURN PHYSICIAN APPOINTMENT: TBD     INITIAL ASSESSMENT:  [name] presents with decreased mobility, decreased strength, and pain in right knee region secondary to previous pain and surgery.  Patient has attended a total of 3 scheduled physical therapy visits including initial evaluation on 02/02/2016.  Treatment has consisted of strengthening, stretching, and modalities to improve overall pain and performance with activities of daily living.       PROBLEM LIST (Impacting functional limitations):  1. Decreased Strength  2. Decreased ADL/Functional Activities  3. Decreased Ambulation Ability/Technique  4. Decreased Balance  5. Increased Pain  6. Decreased Activity Tolerance INTERVENTIONS PLANNED:  1. Cold  2. Heat  3. Home Exercise Program (HEP)  4. Manual Therapy  5. Neuromuscular Re-education/Strengthening  6. Range of Motion (ROM)  7. Therapeutic Activites  8. Therapeutic Exercise/Strengthening  9. Transcutaneous Electrical Nerve Stimulation (TENS)   TREATMENT PLAN:  Effective Dates: 02/02/2016 TO 04/03/2016.  Frequency/Duration: 2 times a week for 8 weeks  GOALS: (Goals have been discussed and agreed upon with patient.)  Short-Term Functional Goals: Time Frame: 3 weeks  1. Patient will be independent with home exercise program without  exacerbation of symptoms or cueing needed.   2. Patient will be independent with correct sleeping positions and awareness/avoidance of aggravating positions without cueing needed.   Discharge Goals: Time Frame: 8 weeks  1. Patient will be independent with all ADLs with minimal onset of knee and back pain and no deficits with daily tasks.  2. Patient will report no fear avoidance with social or recreational activities due to knee and back pain.  3. Patient will score less than or equal to 60/80 on Lower Extremity Functional Scale with minimal effect of leg and back pain on patient's ability to manage every day life activities.  Rehabilitation Potential For Stated Goals: Good            The information in this section was collected on 02/02/2016 (except where otherwise noted).  HISTORY:   History of Present Injury/Illness (Reason for Referral):  Patient reports he had surgery on his knees in May and his back in December of 2016.  He reports he has been doing well with trying to stay mobile and active.  He reports he has been wearing his back brace (per his surgeon) until recently. He reports his knees have been doing really well.  He reports, however, lately he has been having some pain/discomfort in his right knee, which he reports was the more painful knee from the beginning.  He reports he was doing well after he had surgery and was doing therapy, but he had to stop because of his back surgery and per that surgeon. He reports he would like to get back to therapy to get his knees back to feeling  better and stronger.  Past Medical History/Comorbidities:   [name]  has a past medical history of Allergic rhinitis, cause unspecified; BMI 40.0-44.9, adult (HCC); Chronic left sacroiliac joint pain; Chronic radicular low back pain; Diverticulitis; Essential hypertension, benign; Generalized anxiety disorder; GERD (gastroesophageal reflux disease); Hypercholesterolemia; Left lumbar radiculitis; Lumbar  stenosis with neurogenic claudication (11/06/2015); Mood disorder (HCC) (07/14/2014); Morbid obesity (HCC); OSA on CPAP; Polycythemia (HCC) (dx--06/2015); Prediabetes; Spondylolisthesis at L4-L5 level (10/19/2015); Testicular hypofunction; Venous insufficiency of both lower extremities (07/14/2014); and Vitamin D deficiency.  [name]  has a past surgical history that includes endoscopy, colon, diagnostic (2011); knee arthroscopy (Bilateral, 04/2015); colonoscopy (05/2015); and orthopaedic (Left, 10/19/2015).  Social History/Living Environment:   Home Environment: Private residence  Living Alone: No  Support Systems: Family member(s), Friends \\ neighbors  Prior Level of Function/Work/Activity:  Independent  Dominant Side:         RIGHT  Previous Treatment Approaches:          Previous PT following s/p meniscal repair  Personal Factors:          Sex:  male        Age:  60 y.o.        Profession:  Advertising  Current Medications:       Current Outpatient Prescriptions:   ???  traZODone (DESYREL) 50 mg tablet, Take 1 Tab by mouth nightly., Disp: 30 Tab, Rfl: 1  ???  metFORMIN (GLUCOPHAGE) 1,000 mg tablet, TAKE ONE TABLET BY MOUTH TWICE A DAY, Disp: 60 Tab, Rfl: 3  ???  testosterone cypionate (DEPOTESTOTERONE CYPIONATE) 200 mg/mL injection, INJECT 1/2 MILLILITER EVERY 7 DAYS, Disp: 2 mL, Rfl: 5  ???  valsartan-hydroCHLOROthiazide (DIOVAN-HCT) 320-12.5 mg per tablet, TAKE 1 TABLET BY MOUTH EVERY DAY, Disp: 30 Tab, Rfl: 5  ???  metoprolol succinate (TOPROL-XL) 100 mg tablet, Take 1 Tab by mouth nightly. Indications: hypertension, Disp: 30 Tab, Rfl: 5  ???  pravastatin (PRAVACHOL) 40 mg tablet, TAKE 1 TABLET BY MOUTH AT BEDTIME, Disp: 30 Tab, Rfl: 5  ???  aspirin (ASPIRIN LOW-STRENGTH) 81 mg chewable tablet, Take 81 mg by mouth every morning. Indications: continue, Disp: , Rfl:   ???  MULTI-VITAMIN HI-PO PO, Take 2 Tabs by mouth every morning. Indications: hold until after surgery, Disp: , Rfl:    Date Last Reviewed:  02/15/2016     Number of Personal Factors/Comorbidities that affect the Plan of Care: 1-2: MODERATE COMPLEXITY   EXAMINATION:   Observation/Orthostatic Postural Assessment:          Posture Assessment: Rounded shoulders, Forward head   Palpation:          Milt tenderness to palpation noted along right knee near previous surgical scars.  No bruising noted.  Minimal swelling noted.    ROM: R LE Assessment (AROM):   ?? Hip Flexion: 80 degrees    ?? Hip Extension: 5 degrees    ?? Knee Flexion: 110 degrees    ?? Knee Extension: 6 degrees     Strength:  R LE Assessment (Strength):   ?? Hip Flexion: 4/5 with manual muscle testing    ?? Hip Extension: 4/5 with manual muscle testing    ?? Knee Flexion: 3+/5 with manual muscle testing    ?? Knee Extension: 3+/5 with manual muscle testing     Special Tests:   Negative sit/slump test.  Negative Marcher's test.  Negative forward flexion test.  Negative anterior drawer test for ACL involvement. Negative Medial-Lateral Grind test for possible meniscal involvement  in left lower extremity.  Negative slocum test with rotation for ACL, MCL or capsular involvement.  Negative Posterior Drawer test for PCL involvement.  Negative posteromedial drawer test for PCL, MCL or capsular involvement.  Negative posterolateral drawer test for PCL, LCL or capsular involvement.      Neurological Screen:        Dermatomes:  Within normal limits.        Reflexes:  2+  Functional Mobility:         Gait/Ambulation:     Base of Support: Center of gravity altered  Speed/Cadence: Pace decreased (<100 feet/min)  Step Length: Right shortened, Left shortened  Swing Pattern: Right asymmetrical, Left asymmetrical  Gait Abnormalities: Antalgic         Transfers:  Independent   Body Structures Involved:  1. Joints  2. Muscles Body Functions Affected:  1. Movement Related Activities and Participation Affected:  1. Mobility  2. Self Care   Number of elements (examined above) that affect the Plan of Care: 4+: HIGH COMPLEXITY    CLINICAL PRESENTATION:   Presentation: Evolving clinical presentation with changing clinical characteristics: MODERATE COMPLEXITY   CLINICAL DECISION MAKING:   Outcome Measure: Tool Used: Lower Extremity Functional Scale (LEFS)  Score:  Initial: 46/80 Most Recent: X/80 (Date: -- )   Interpretation of Score: 20 questions each scored on a 5 point scale with 0 representing "extreme difficulty or unable to perform" and 4 representing "no difficulty".  The lower the score, the greater the functional disability. 80/80 represents no disability.  Minimal detectable change is 9 points.  Score 80 79-63 62-48 47-32 31-16 15-1 0   Modifier CH CI CJ CK CL CM CN     Medical Necessity:   ?? Patient is expected to demonstrate progress in strength and range of motion to increase independence with daily activities.  ?? Patient demonstrates good rehab potential due to higher previous functional level.  ?? Skilled intervention continues to be required due to decreased strength and mobility.  Reason for Services/Other Comments:  ?? Patient continues to demonstrate capacity to improve overall mobility which will increase independence and increase safety.   Use of outcome tool(s) and clinical judgement create a POC that gives a: Questionable prediction of patient's progress: MODERATE COMPLEXITY            TREATMENT:   (In addition to Assessment/Re-Assessment sessions the following treatments were rendered)  Pre-treatment Symptoms/Complaints:  02/15/2016: Patient reports he had some swelling in his ankle, but he has been riding his bike when he doesn't come to therapy.  Pain: Initial:   Pain Intensity 1: 5  Pain Location 1: Knee  Pain Orientation 1: Right  Pain Intervention(s) 1: Rest, Medication (see MAR)  Post Session:  4/10     THERAPEUTIC EXERCISE: (40 minutes):  Exercises per grid below to improve mobility and strength.  Required minimal verbal and manual cues to promote  proper body alignment, promote proper body posture and promote proper body mechanics.  Progressed resistance, range, repetitions and complexity of movement as indicated.   Date:  02/15/2016   Activity/Exercise Parameters   Airdyne 8 minutes   Long arc quad 2 pounds  20 reps   Straight leg raise 2 pounds  20 reps   Bridging 20 reps   Active hamstring stretch 5 second hold  5 reps  B LE   Step ups 4 inch  20 reps   Step down 4 inch  20 reps   Single leg balance 10  second hold  6 reps  Blue foam   Calf stretch Slant board  15 second hold  2 reps      Patient declined cold pack today.   Treatment/Session Assessment:    ?? Response to Treatment:  Patient tolerated treatment without complaints of increased pain.  Patient able to complete all exercises.  ?? Compliance with Program/Exercises: Will assess as treatment progresses.  ?? Recommendations/Intent for next treatment session: "Next visit will focus on advancements to more challenging activities".  Total Treatment Duration:  PT Patient Time In/Time Out  Time In: 1115  Time Out: 1200    Allene DillonJennifer N Wash Nienhaus, PT

## 2016-02-15 NOTE — Progress Notes (Signed)
Steven [name]  DOB: [birth year] [facility]  [address], [place]  Phone:[phone]   [phone]            OUTPATIENT PHYSICAL THERAPY:Discontinuation Summary 10/09/2016    ICD-10: Treatment Diagnosis:  ?? Pain in right knee (M25.561)  ?? Stiffness of right knee, not elsewhere classified (M25.661)  ??  Low back pain (M54.5)  Precautions/Allergies:   Nsaids (non-steroidal anti-inflammatory drug)   Fall Risk Score: 1 (? 5 = High Risk)  MD Orders: Evaluate and Treat MEDICAL/REFERRING DIAGNOSIS:  Back pain [M54.9]  Neck pain [M54.2]   DATE OF ONSET: Chronic; Surgery: 10/21/2015  REFERRING PHYSICIAN: Annabell Howells, MD  RETURN PHYSICIAN APPOINTMENT: TBD     ASSESSMENT:  [name] presented with decreased mobility, decreased strength, and pain in right knee region secondary to previous pain and surgery.   Treatment consisted of strengthening, stretching, and modalities to improve overall pain and performance with activities of daily living.  Secondary to his back pain, patient unable to attend therapy at this time. His therapy will be discontinued and we will be happy to re-assess him with a change in his status and a new order from his doctor.  Thank you for the opportunity to serve this patient.      GOALS: (Goals have been discussed and agreed upon with patient.)  Short-Term Functional Goals: Time Frame: 3 weeks  1. Patient will be independent with home exercise program without exacerbation of symptoms or cueing needed.  MET  03-20-16  2. Patient will be independent with correct sleeping positions and awareness/avoidance of aggravating positions without cueing needed. MET 03-20-16  Discharge Goals: Time Frame: 8 weeks  1. Patient will be independent with all ADLs with minimal onset of knee and back pain and no deficits with daily tasks.  MET 03-20-16  2. Patient will report no fear avoidance with social or recreational  activities due to knee and back pain--goal unmet.   3. Patient will score less than or equal to 60/80 on Lower Extremity Functional Scale with minimal effect of leg and back pain on patient's ability to manage every day life activities--goal unmet.                   EXAMINATION:   Observation/Orthostatic Postural Assessment:          Posture Assessment: Rounded shoulders, Forward head   Palpation:          Milt tenderness to palpation noted along right knee near previous surgical scars.  No bruising noted.  Minimal swelling noted.    ROM: R LE Assessment (AROM):   ?? Hip Flexion: 80 degrees    ?? Hip Extension: 5 degrees    ?? Knee Flexion: 110 degrees    ?? Knee Extension: 6 degrees     Strength:  R LE Assessment (Strength):   ?? Hip Flexion: 4/5 with manual muscle testing    ?? Hip Extension: 4/5 with manual muscle testing    ?? Knee Flexion: 3+/5 with manual muscle testing    ?? Knee Extension: 3+/5 with manual muscle testing     Special Tests:   Negative sit/slump test.  Negative Marcher's test.  Negative forward flexion test.  Negative anterior drawer test for ACL involvement. Negative Medial-Lateral Grind test for possible meniscal involvement in left lower extremity.  Negative slocum test with rotation for ACL, MCL or capsular involvement.  Negative Posterior Drawer test for PCL involvement.  Negative  posteromedial drawer test for PCL, MCL or capsular involvement.  Negative posterolateral drawer test for PCL, LCL or capsular involvement.      Neurological Screen:        Dermatomes:  Within normal limits.        Reflexes:  2+  Functional Mobility:         Gait/Ambulation:     Base of Support: Center of gravity altered  Speed/Cadence: Pace decreased (<100 feet/min)  Step Length: Right shortened, Left shortened  Swing Pattern: Right asymmetrical, Left asymmetrical  Gait Abnormalities: Antalgic         Transfers:  Independent   CLINICAL DECISION MAKING:    Outcome Measure: Tool Used: Lower Extremity Functional Scale (LEFS)  Score:  Initial: 46/80 Most Recent: X/80 (Date: 03-20-16-- )   Interpretation of Score: 20 questions each scored on a 5 point scale with 0 representing "extreme difficulty or unable to perform" and 4 representing "no difficulty".  The lower the score, the greater the functional disability. 80/80 represents no disability.  Minimal detectable change is 9 points.  Score 80 79-63 62-48 47-32 31-16 15-1 0   Modifier CH CI CJ CK CL CM CN                 [name], PT

## 2016-02-21 NOTE — Progress Notes (Signed)
[facility]   [facility] [address]  [address], [address] [place], [place] [id number]  Phone: [phone]   Fax: [phone]    OUTPATIENT DAILY NOTE    NAME/AGE/GENDER: Steven [name] is a 60 y.o. male.     DATE: 02/21/2016    Patient cancelled his appointments for today and tomorrow due to his ankles have been swelling and he is getting ready to go on a trip.  Will plan to follow up on next scheduled visit.    Allene DillonJennifer N Taishawn Smaldone, PT

## 2016-02-22 ENCOUNTER — Encounter: Payer: PRIVATE HEALTH INSURANCE | Primary: Family Medicine

## 2016-02-22 ENCOUNTER — Inpatient Hospital Stay: Payer: PRIVATE HEALTH INSURANCE | Primary: Family Medicine

## 2016-03-06 DIAGNOSIS — M545 Low back pain: Secondary | ICD-10-CM

## 2016-03-06 NOTE — Progress Notes (Signed)
[facility]   [facility] [address]  [address], [address] [place], [place] [id number]  Phone: [phone]   Fax: [phone]    OUTPATIENT DAILY NOTE    NAME/AGE/GENDER: Barton DuboisMichael L Neglia is a 60 y.o. male.     DATE: 03/06/2016    Patient cancelled his appointment for today due to being in the hospital with his wife for her epidural/injections.  Will plan to follow up on next scheduled visit.    Allene DillonJennifer N Taylan Mayhan, PT

## 2016-03-07 ENCOUNTER — Inpatient Hospital Stay: Payer: PRIVATE HEALTH INSURANCE | Primary: Family Medicine

## 2016-03-08 ENCOUNTER — Inpatient Hospital Stay: Admit: 2016-03-08 | Payer: PRIVATE HEALTH INSURANCE | Primary: Family Medicine

## 2016-03-08 NOTE — Progress Notes (Signed)
Steven DuboisMichael L [name]  DOB: [birth year] [facility]  [address], [address], [place] [id number]  Phone:[phone]   [phone]            OUTPATIENT PHYSICAL THERAPY:Daily Note 03/08/2016    ICD-10: Treatment Diagnosis:  ?? Pain in right knee (M25.561)  ?? Stiffness of right knee, not elsewhere classified (M25.661)  ??  Low back pain (M54.5)  Precautions/Allergies:   Nsaids (non-steroidal anti-inflammatory drug)   Fall Risk Score: 1 (? 5 = High Risk)  MD Orders: Evaluate and Treat MEDICAL/REFERRING DIAGNOSIS:  Back pain [M54.9]  Neck pain [M54.2]   DATE OF ONSET: Chronic; Surgery: 10/21/2015  REFERRING PHYSICIAN: Darl HouseholderMina, Christie B, MD  RETURN PHYSICIAN APPOINTMENT: TBD     INITIAL ASSESSMENT:  [name] [name] with decreased mobility, decreased strength, and pain in right knee region secondary to previous pain and surgery.  Patient has attended a total of 4 scheduled physical therapy visits including initial evaluation on 02/02/2016.  Treatment has consisted of strengthening, stretching, and modalities to improve overall pain and performance with activities of daily living.       PROBLEM LIST (Impacting functional limitations):  1. Decreased Strength  2. Decreased ADL/Functional Activities  3. Decreased Ambulation Ability/Technique  4. Decreased Balance  5. Increased Pain  6. Decreased Activity Tolerance INTERVENTIONS PLANNED:  1. Cold  2. Heat  3. Home Exercise Program (HEP)  4. Manual Therapy  5. Neuromuscular Re-education/Strengthening  6. Range of Motion (ROM)  7. Therapeutic Activites  8. Therapeutic Exercise/Strengthening  9. Transcutaneous Electrical Nerve Stimulation (TENS)   TREATMENT PLAN:  Effective Dates: 02/02/2016 TO 04/03/2016.  Frequency/Duration: 2 times a week for 8 weeks  GOALS: (Goals have been discussed and agreed upon with patient.)  Short-Term Functional Goals: Time Frame: 3 weeks  1. Patient will be independent with home exercise program without  exacerbation of symptoms or cueing needed.   2. Patient will be independent with correct sleeping positions and awareness/avoidance of aggravating positions without cueing needed.   Discharge Goals: Time Frame: 8 weeks  1. Patient will be independent with all ADLs with minimal onset of knee and back pain and no deficits with daily tasks.  2. Patient will report no fear avoidance with social or recreational activities due to knee and back pain.  3. Patient will score less than or equal to 60/80 on Lower Extremity Functional Scale with minimal effect of leg and back pain on patient's ability to manage every day life activities.  Rehabilitation Potential For Stated Goals: Good            The information in this section was collected on 02/02/2016 (except where otherwise noted).  HISTORY:   History of Present Injury/Illness (Reason for Referral):  Patient reports he had surgery on his knees in May and his back in December of 2016.  He reports he has been doing well with trying to stay mobile and active.  He reports he has been wearing his back brace (per his surgeon) until recently. He reports his knees have been doing really well.  He reports, however, lately he has been having some pain/discomfort in his right knee, which he reports was the more painful knee from the beginning.  He reports he was doing well after he had surgery and was doing therapy, but he had to stop because of his back surgery and per that surgeon. He reports he would like to get back to therapy to get his knees back to feeling  better and stronger.  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  Steven [name]  has a past surgical history that includes endoscopy, colon, diagnostic (2011); knee arthroscopy (Bilateral, 04/2015); colonoscopy (05/2015); and orthopaedic (Left, 10/19/2015).  Social History/Living Environment:   Home Environment: Private residence  Living Alone: No  Support Systems: Family member(s), Friends \\ neighbors  Prior Level of Function/Work/Activity:  Independent  Dominant Side:         RIGHT  Previous Treatment Approaches:          Previous PT following s/p meniscal repair  Personal Factors:          Sex:  male        Age:  60 y.o.        Profession:  Advertising  Current Medications:       Current Outpatient Prescriptions:   ???  traZODone (DESYREL) 50 mg tablet, Take 1 Tab by mouth nightly., Disp: 30 Tab, Rfl: 1  ???  metFORMIN (GLUCOPHAGE) 1,000 mg tablet, TAKE ONE TABLET BY MOUTH TWICE A DAY, Disp: 60 Tab, Rfl: 3  ???  testosterone cypionate (DEPOTESTOTERONE CYPIONATE) 200 mg/mL injection, INJECT 1/2 MILLILITER EVERY 7 DAYS, Disp: 2 mL, Rfl: 5  ???  valsartan-hydroCHLOROthiazide (DIOVAN-HCT) 320-12.5 mg per tablet, TAKE 1 TABLET BY MOUTH EVERY DAY, Disp: 30 Tab, Rfl: 5  ???  metoprolol succinate (TOPROL-XL) 100 mg tablet, Take 1 Tab by mouth nightly. Indications: hypertension, Disp: 30 Tab, Rfl: 5  ???  pravastatin (PRAVACHOL) 40 mg tablet, TAKE 1 TABLET BY MOUTH AT BEDTIME, Disp: 30 Tab, Rfl: 5  ???  aspirin (ASPIRIN LOW-STRENGTH) 81 mg chewable tablet, Take 81 mg by mouth every morning. Indications: continue, Disp: , Rfl:   ???  MULTI-VITAMIN HI-PO PO, Take 2 Tabs by mouth every morning. Indications: hold until after surgery, Disp: , Rfl:    Date Last Reviewed:  03/08/2016     Number of Personal Factors/Comorbidities that affect the Plan of Care: 1-2: MODERATE COMPLEXITY   EXAMINATION:   Observation/Orthostatic Postural Assessment:          Posture Assessment: Rounded shoulders, Forward head   Palpation:          Milt tenderness to palpation noted along right knee near previous surgical scars.  No bruising noted.  Minimal swelling noted.    ROM: R LE Assessment (AROM):   ?? Hip Flexion: 80 degrees    ?? Hip Extension: 5 degrees    ?? Knee Flexion: 110 degrees    ?? Knee Extension: 6 degrees     Strength:  R LE Assessment (Strength):   ?? Hip Flexion: 4/5 with manual muscle testing    ?? Hip Extension: 4/5 with manual muscle testing    ?? Knee Flexion: 3+/5 with manual muscle testing    ?? Knee Extension: 3+/5 with manual muscle testing     Special Tests:   Negative sit/slump test.  Negative Marcher's test.  Negative forward flexion test.  Negative anterior drawer test for ACL involvement. Negative Medial-Lateral Grind test for possible meniscal involvement  in left lower extremity.  Negative slocum test with rotation for ACL, MCL or capsular involvement.  Negative Posterior Drawer test for PCL involvement.  Negative posteromedial drawer test for PCL, MCL or capsular involvement.  Negative posterolateral drawer test for PCL, LCL or capsular involvement.      Neurological Screen:        Dermatomes:  Within normal limits.        Reflexes:  2+  Functional Mobility:         Gait/Ambulation:     Base of Support: Center of gravity altered  Speed/Cadence: Pace decreased (<100 feet/min)  Step Length: Right shortened, Left shortened  Swing Pattern: Right asymmetrical, Left asymmetrical  Gait Abnormalities: Antalgic         Transfers:  Independent   Body Structures Involved:  1. Joints  2. Muscles Body Functions Affected:  1. Movement Related Activities and Participation Affected:  1. Mobility  2. Self Care   Number of elements (examined above) that affect the Plan of Care: 4+: HIGH COMPLEXITY    CLINICAL PRESENTATION:   Presentation: Evolving clinical presentation with changing clinical characteristics: MODERATE COMPLEXITY   CLINICAL DECISION MAKING:   Outcome Measure: Tool Used: Lower Extremity Functional Scale (LEFS)  Score:  Initial: 46/80 Most Recent: X/80 (Date: -- )   Interpretation of Score: 20 questions each scored on a 5 point scale with 0 representing "extreme difficulty or unable to perform" and 4 representing "no difficulty".  The lower the score, the greater the functional disability. 80/80 represents no disability.  Minimal detectable change is 9 points.  Score 80 79-63 62-48 47-32 31-16 15-1 0   Modifier CH CI CJ CK CL CM CN     Medical Necessity:   ?? Patient is expected to demonstrate progress in strength and range of motion to increase independence with daily activities.  ?? Patient demonstrates good rehab potential due to higher previous functional level.  ?? Skilled intervention continues to be required due to decreased strength and mobility.  Reason for Services/Other Comments:  ?? Patient continues to demonstrate capacity to improve overall mobility which will increase independence and increase safety.   Use of outcome tool(s) and clinical judgement create a POC that gives a: Questionable prediction of patient's progress: MODERATE COMPLEXITY            TREATMENT:   (In addition to Assessment/Re-Assessment sessions the following treatments were rendered)  Pre-treatment Symptoms/Complaints:  03/08/2016: I think the bike is swelling up my knee, I don't know.  It is coming from the back.  Pain: Initial:     1-10 more swelling and irritated.   Post Session:  4/10     THERAPEUTIC EXERCISE: (40 minutes):  Exercises per grid below to improve mobility and strength.  Required minimal verbal and manual cues to promote proper body alignment, promote proper body posture and promote proper body mechanics.  Progressed resistance, range, repetitions and complexity of movement as indicated.   Date:   03-08-16   Activity/Exercise Parameters   Airdyne-New step 8 minutes Level 4.0   Long arc quad-SAQ 3 pounds  20 reps   Straight leg raise 3 pounds  20 reps   SKTC 10 sec holds 3 x bilaterally   Briding X 10 reps   Tbands Rows-Extension X 20 reps each   Pelvic 20 reps   Active hamstring stretch 5 second hold  5 reps  B LE   Step ups 6 inch  20 reps   Step down 4 inch  20 reps   Single leg balance 10 second hold  6 reps  Blue foam   Calf stretch Slant board  15 second hold  2 reps      Patient declined cold pack today.   Treatment/Session Assessment:    ?? Response to Treatment:  Patient tolerated treatment without complaints of increased pain. Left discoloration, red on lower leg.  Added some back stretches for today.  Continue working on back strengthening and stretches per order.  ?? Compliance with Program/Exercises: Will assess as treatment progresses.  ?? Recommendations/Intent for next treatment session: "Next visit will focus on advancements to more challenging activities".  Total Treatment Duration:  PT Patient Time In/Time Out  Time In: 0945  Time Out: [address], [place]

## 2016-03-13 ENCOUNTER — Inpatient Hospital Stay: Admit: 2016-03-13 | Payer: PRIVATE HEALTH INSURANCE | Primary: Family Medicine

## 2016-03-13 NOTE — Progress Notes (Signed)
Steven [name]  DOB: Dec 27, 1955 [facility]  [address], [address], [place] [address]  Phone:[phone]   Fax:[phone]            OUTPATIENT PHYSICAL THERAPY:Daily Note 03/13/2016    ICD-10: Treatment Diagnosis:  ?? Pain in right knee (M25.561)  ?? Stiffness of right knee, not elsewhere classified (M25.661)  ??  Low back pain (M54.5)  Precautions/Allergies:   Nsaids (non-steroidal anti-inflammatory drug)   Fall Risk Score: 1 (? 5 = High Risk)  MD Orders: Evaluate and Treat MEDICAL/REFERRING DIAGNOSIS:  Back pain [M54.9]  Neck pain [M54.2]   DATE OF ONSET: Chronic; Surgery: 10/21/2015  REFERRING PHYSICIAN: Darl Householder, MD  RETURN PHYSICIAN APPOINTMENT: TBD     INITIAL ASSESSMENT:  Steven [name] presents with decreased mobility, decreased strength, and pain in right knee region secondary to previous pain and surgery.  Patient has attended a total of 6 scheduled physical therapy visits including initial evaluation on 02/02/2016.  Treatment has consisted of strengthening, stretching, and modalities to improve overall pain and performance with activities of daily living.       PROBLEM LIST (Impacting functional limitations):  1. Decreased Strength  2. Decreased ADL/Functional Activities  3. Decreased Ambulation Ability/Technique  4. Decreased Balance  5. Increased Pain  6. Decreased Activity Tolerance INTERVENTIONS PLANNED:  1. Cold  2. Heat  3. Home Exercise Program (HEP)  4. Manual Therapy  5. Neuromuscular Re-education/Strengthening  6. Range of Motion (ROM)  7. Therapeutic Activites  8. Therapeutic Exercise/Strengthening  9. Transcutaneous Electrical Nerve Stimulation (TENS)   TREATMENT PLAN:  Effective Dates: 02/02/2016 TO 04/03/2016.  Frequency/Duration: 2 times a week for 8 weeks  GOALS: (Goals have been discussed and agreed upon with patient.)  Short-Term Functional Goals: Time Frame: 3 weeks  1. Patient will be independent with home exercise program without  exacerbation of symptoms or cueing needed.   2. Patient will be independent with correct sleeping positions and awareness/avoidance of aggravating positions without cueing needed.   Discharge Goals: Time Frame: 8 weeks  1. Patient will be independent with all ADLs with minimal onset of knee and back pain and no deficits with daily tasks.  2. Patient will report no fear avoidance with social or recreational activities due to knee and back pain.  3. Patient will score less than or equal to 60/80 on Lower Extremity Functional Scale with minimal effect of leg and back pain on patient's ability to manage every day life activities.  Rehabilitation Potential For Stated Goals: Good            The information in this section was collected on 02/02/2016 (except where otherwise noted).  HISTORY:   History of Present Injury/Illness (Reason for Referral):  Patient reports he had surgery on his knees in May and his back in December of 2016.  He reports he has been doing well with trying to stay mobile and active.  He reports he has been wearing his back brace (per his surgeon) until recently. He reports his knees have been doing really well.  He reports, however, lately he has been having some pain/discomfort in his right knee, which he reports was the more painful knee from the beginning.  He reports he was doing well after he had surgery and was doing therapy, but he had to stop because of his back surgery and per that surgeon. He reports he would like to get back to therapy to get his knees back to feeling  better and stronger.  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  Steven [name]  has a past surgical history that includes endoscopy, colon, diagnostic (2011); knee arthroscopy (Bilateral, 04/2015); colonoscopy (05/2015); and orthopaedic (Left, 10/19/2015).  Social History/Living Environment:   Home Environment: Private residence  Living Alone: No  Support Systems: Family member(s), Friends \\ neighbors  Prior Level of Function/Work/Activity:  Independent  Dominant Side:         RIGHT  Previous Treatment Approaches:          Previous PT following s/p meniscal repair  Personal Factors:          Sex:  male        Age:  60 y.o.        Profession:  Advertising  Current Medications:       Current Outpatient Prescriptions:   ???  traZODone (DESYREL) 50 mg tablet, Take 1 Tab by mouth nightly., Disp: 30 Tab, Rfl: 1  ???  metFORMIN (GLUCOPHAGE) 1,000 mg tablet, TAKE ONE TABLET BY MOUTH TWICE A DAY, Disp: 60 Tab, Rfl: 3  ???  testosterone cypionate (DEPOTESTOTERONE CYPIONATE) 200 mg/mL injection, INJECT 1/2 MILLILITER EVERY 7 DAYS, Disp: 2 mL, Rfl: 5  ???  valsartan-hydroCHLOROthiazide (DIOVAN-HCT) 320-12.5 mg per tablet, TAKE 1 TABLET BY MOUTH EVERY DAY, Disp: 30 Tab, Rfl: 5  ???  metoprolol succinate (TOPROL-XL) 100 mg tablet, Take 1 Tab by mouth nightly. Indications: hypertension, Disp: 30 Tab, Rfl: 5  ???  pravastatin (PRAVACHOL) 40 mg tablet, TAKE 1 TABLET BY MOUTH AT BEDTIME, Disp: 30 Tab, Rfl: 5  ???  aspirin (ASPIRIN LOW-STRENGTH) 81 mg chewable tablet, Take 81 mg by mouth every morning. Indications: continue, Disp: , Rfl:   ???  MULTI-VITAMIN HI-PO PO, Take 2 Tabs by mouth every morning. Indications: hold until after surgery, Disp: , Rfl:    Date Last Reviewed:  03/13/2016     Number of Personal Factors/Comorbidities that affect the Plan of Care: 1-2: MODERATE COMPLEXITY   EXAMINATION:   Observation/Orthostatic Postural Assessment:          Posture Assessment: Rounded shoulders, Forward head   Palpation:          Milt tenderness to palpation noted along right knee near previous surgical scars.  No bruising noted.  Minimal swelling noted.    ROM: R LE Assessment (AROM):   ?? Hip Flexion: 80 degrees    ?? Hip Extension: 5 degrees    ?? Knee Flexion: 110 degrees    ?? Knee Extension: 6 degrees     Strength:  R LE Assessment (Strength):   ?? Hip Flexion: 4/5 with manual muscle testing    ?? Hip Extension: 4/5 with manual muscle testing    ?? Knee Flexion: 3+/5 with manual muscle testing    ?? Knee Extension: 3+/5 with manual muscle testing     Special Tests:   Negative sit/slump test.  Negative Marcher's test.  Negative forward flexion test.  Negative anterior drawer test for ACL involvement. Negative Medial-Lateral Grind test for possible meniscal involvement  in left lower extremity.  Negative slocum test with rotation for ACL, MCL or capsular involvement.  Negative Posterior Drawer test for PCL involvement.  Negative posteromedial drawer test for PCL, MCL or capsular involvement.  Negative posterolateral drawer test for PCL, LCL or capsular involvement.      Neurological Screen:        Dermatomes:  Within normal limits.        Reflexes:  2+  Functional Mobility:         Gait/Ambulation:     Base of Support: Center of gravity altered  Speed/Cadence: Pace decreased (<100 feet/min)  Step Length: Right shortened, Left shortened  Swing Pattern: Right asymmetrical, Left asymmetrical  Gait Abnormalities: Antalgic         Transfers:  Independent   Body Structures Involved:  1. Joints  2. Muscles Body Functions Affected:  1. Movement Related Activities and Participation Affected:  1. Mobility  2. Self Care   Number of elements (examined above) that affect the Plan of Care: 4+: HIGH COMPLEXITY    CLINICAL PRESENTATION:   Presentation: Evolving clinical presentation with changing clinical characteristics: MODERATE COMPLEXITY   CLINICAL DECISION MAKING:   Outcome Measure: Tool Used: Lower Extremity Functional Scale (LEFS)  Score:  Initial: 46/80 Most Recent: X/80 (Date: -- )   Interpretation of Score: 20 questions each scored on a 5 point scale with 0 representing "extreme difficulty or unable to perform" and 4 representing "no difficulty".  The lower the score, the greater the functional disability. 80/80 represents no disability.  Minimal detectable change is 9 points.  Score 80 79-63 62-48 47-32 31-16 15-1 0   Modifier CH CI CJ CK CL CM CN     Medical Necessity:   ?? Patient is expected to demonstrate progress in strength and range of motion to increase independence with daily activities.  ?? Patient demonstrates good rehab potential due to higher previous functional level.  ?? Skilled intervention continues to be required due to decreased strength and mobility.  Reason for Services/Other Comments:  ?? Patient continues to demonstrate capacity to improve overall mobility which will increase independence and increase safety.   Use of outcome tool(s) and clinical judgement create a POC that gives a: Questionable prediction of patient's progress: MODERATE COMPLEXITY            TREATMENT:   (In addition to Assessment/Re-Assessment sessions the following treatments were rendered)  Pre-treatment Symptoms/Complaints:  03/13/2016:   Pain: Initial:     1-10 more swelling and irritated.   Post Session:  4/10     THERAPEUTIC EXERCISE: (40 minutes):  Exercises per grid below to improve mobility and strength.  Required minimal verbal and manual cues to promote proper body alignment, promote proper body posture and promote proper body mechanics.  Progressed resistance, range, repetitions and complexity of movement as indicated.   Date:  03-13-16      Activity/Exercise Parameters      Airdyne-New step 8 minutes Level 4.0       Long arc quad-SAQ 3 pounds  20 reps      Straight leg raise 3 pounds  20 reps      SKTC 10 sec holds 3 x bilaterally      Briding X 10 reps      Tbands Rows-Extension X 20 reps each      Pelvic 20 reps      Active hamstring stretch 5 second hold  5 reps  B LE      Step  ups 6 inch  20 reps      Step down 4 inch  20 reps      Single leg balance 10 second hold  6 reps  Blue foam      Calf stretch Slant board  15 second hold  2 reps         Patient declined cold pack today.   Treatment/Session Assessment:    ?? Response to Treatment:  Patient given written hand out of exercise that he can complete in his pool at home. He is going to follow this program for the next week and see how he does. If he has no issues, he may continue this on his own at home.   ?? Compliance with Program/Exercises: Will assess as treatment progresses.  ?? Recommendations/Intent for next treatment session: "Next visit will focus on advancements to more challenging activities".  Total Treatment Duration:  PT Patient Time In/Time Out  Time In: 1000  Time Out: 1045    Shiasia Porro A Maxcine Ham, PT

## 2016-03-15 ENCOUNTER — Encounter: Payer: PRIVATE HEALTH INSURANCE | Primary: Family Medicine

## 2016-03-20 ENCOUNTER — Inpatient Hospital Stay: Admit: 2016-03-20 | Payer: PRIVATE HEALTH INSURANCE | Primary: Family Medicine

## 2016-03-20 NOTE — Progress Notes (Signed)
Steven [name]  DOB: 1955-11-03 Therapy Center at [facility]  [address], [place]  Phone:[phone]   [phone]            OUTPATIENT PHYSICAL THERAPY:Daily Note 03/20/2016    ICD-10: Treatment Diagnosis:  ?? Pain in right knee (M25.561)  ?? Stiffness of right knee, not elsewhere classified (M25.661)  ??  Low back pain (M54.5)  Precautions/Allergies:   Nsaids (non-steroidal anti-inflammatory drug)   Fall Risk Score: 1 (? 5 = High Risk)  MD Orders: Evaluate and Treat MEDICAL/REFERRING DIAGNOSIS:  Back pain [M54.9]  Neck pain [M54.2]   DATE OF ONSET: Chronic; Surgery: 10/21/2015  REFERRING PHYSICIAN: Annabell Howells, MD  RETURN PHYSICIAN APPOINTMENT: TBD     INITIAL ASSESSMENT:  Steven [name] presents with decreased mobility, decreased strength, and pain in right knee region secondary to previous pain and surgery.   Treatment has consisted of strengthening, stretching, and modalities to improve overall pain and performance with activities of daily living.       PROBLEM LIST (Impacting functional limitations):  1. Decreased Strength  2. Decreased ADL/Functional Activities  3. Decreased Ambulation Ability/Technique  4. Decreased Balance  5. Increased Pain  6. Decreased Activity Tolerance INTERVENTIONS PLANNED:  1. Cold  2. Heat  3. Home Exercise Program (HEP)  4. Manual Therapy  5. Neuromuscular Re-education/Strengthening  6. Range of Motion (ROM)  7. Therapeutic Activites  8. Therapeutic Exercise/Strengthening  9. Transcutaneous Electrical Nerve Stimulation (TENS)   TREATMENT PLAN:  Effective Dates: 02/02/2016 TO 04/03/2016.  Frequency/Duration: 2 times a week for 8 weeks  GOALS: (Goals have been discussed and agreed upon with patient.)  Short-Term Functional Goals: Time Frame: 3 weeks  1. Patient will be independent with home exercise program without exacerbation of symptoms or cueing needed.  MET  03-20-16  2. Patient will be independent with correct sleeping positions and  awareness/avoidance of aggravating positions without cueing needed. MET 03-20-16  Discharge Goals: Time Frame: 8 weeks  1. Patient will be independent with all ADLs with minimal onset of knee and back pain and no deficits with daily tasks.  MET 03-20-16  2. Patient will report no fear avoidance with social or recreational activities due to knee and back pain.   3. Patient will score less than or equal to 60/80 on Lower Extremity Functional Scale with minimal effect of leg and back pain on patient's ability to manage every day life activities.  Rehabilitation Potential For Stated Goals: Good            The information in this section was collected on 02/02/2016 (except where otherwise noted).  HISTORY:   History of Present Injury/Illness (Reason for Referral):  Patient reports he had surgery on his knees in May and his back in December of 2016.  He reports he has been doing well with trying to stay mobile and active.  He reports he has been wearing his back brace (per his surgeon) until recently. He reports his knees have been doing really well.  He reports, however, lately he has been having some pain/discomfort in his right knee, which he reports was the more painful knee from the beginning.  He reports he was doing well after he had surgery and was doing therapy, but he had to stop because of his back surgery and per that surgeon. He reports he would like to get back to therapy to get his knees back to feeling better and stronger.  Past Medical History/Comorbidities:  Steven [name]  has a past medical history of Allergic rhinitis, cause unspecified; BMI 40.0-44.9, adult ([place]); Chronic left sacroiliac joint pain; Chronic radicular low back pain; Diverticulitis; Essential hypertension, benign; Generalized anxiety disorder; GERD (gastroesophageal reflux disease); Hypercholesterolemia; Left lumbar radiculitis; Lumbar stenosis with neurogenic claudication (11/06/2015); Mood disorder ([place])  (07/14/2014); Morbid obesity ([place]); OSA on CPAP; Polycythemia ([place]) (dx--06/2015); Prediabetes; Spondylolisthesis at L4-L5 level (10/19/2015); Testicular hypofunction; Venous insufficiency of both lower extremities (07/14/2014); and Vitamin D deficiency.  Steven [name]  has a past surgical history that includes endoscopy, colon, diagnostic (2011); knee arthroscopy (Bilateral, 04/2015); colonoscopy (05/2015); and orthopaedic (Left, 10/19/2015).  Social History/Living Environment:   Home Environment: Private residence  Living Alone: No  Support Systems: Family member(s), Friends \\ neighbors  Prior Level of Function/Work/Activity:  Independent  Dominant Side:         RIGHT  Previous Treatment Approaches:          Previous PT following s/p meniscal repair  Personal Factors:          Sex:  male        Age:  60 y.o.        Profession:  Advertising  Current Medications:       Current Outpatient Prescriptions:   ???  traZODone (DESYREL) 50 mg tablet, Take 1 Tab by mouth nightly., Disp: 30 Tab, Rfl: 1  ???  metFORMIN (GLUCOPHAGE) 1,000 mg tablet, TAKE ONE TABLET BY MOUTH TWICE A DAY, Disp: 60 Tab, Rfl: 3  ???  testosterone cypionate (DEPOTESTOTERONE CYPIONATE) 200 mg/mL injection, INJECT 1/2 MILLILITER EVERY 7 DAYS, Disp: 2 mL, Rfl: 5  ???  valsartan-hydroCHLOROthiazide (DIOVAN-HCT) 320-12.5 mg per tablet, TAKE 1 TABLET BY MOUTH EVERY DAY, Disp: 30 Tab, Rfl: 5  ???  metoprolol succinate (TOPROL-XL) 100 mg tablet, Take 1 Tab by mouth nightly. Indications: hypertension, Disp: 30 Tab, Rfl: 5  ???  pravastatin (PRAVACHOL) 40 mg tablet, TAKE 1 TABLET BY MOUTH AT BEDTIME, Disp: 30 Tab, Rfl: 5  ???  aspirin (ASPIRIN LOW-STRENGTH) 81 mg chewable tablet, Take 81 mg by mouth every morning. Indications: continue, Disp: , Rfl:   ???  MULTI-VITAMIN HI-PO PO, Take 2 Tabs by mouth every morning. Indications: hold until after surgery, Disp: , Rfl:    Date Last Reviewed:  03/20/2016    Number of Personal Factors/Comorbidities that affect the Plan of Care:  1-2: MODERATE COMPLEXITY   EXAMINATION:   Observation/Orthostatic Postural Assessment:          Posture Assessment: Rounded shoulders, Forward head   Palpation:          Milt tenderness to palpation noted along right knee near previous surgical scars.  No bruising noted.  Minimal swelling noted.    ROM: R LE Assessment (AROM):   ?? Hip Flexion: 80 degrees    ?? Hip Extension: 5 degrees    ?? Knee Flexion: 110 degrees    ?? Knee Extension: 6 degrees     Strength:  R LE Assessment (Strength):   ?? Hip Flexion: 4/5 with manual muscle testing    ?? Hip Extension: 4/5 with manual muscle testing    ?? Knee Flexion: 3+/5 with manual muscle testing    ?? Knee Extension: 3+/5 with manual muscle testing     Special Tests:   Negative sit/slump test.  Negative Marcher's test.  Negative forward flexion test.  Negative anterior drawer test for ACL involvement. Negative Medial-Lateral Grind test for possible meniscal involvement in left lower extremity.  Negative slocum test with  rotation for ACL, MCL or capsular involvement.  Negative Posterior Drawer test for PCL involvement.  Negative posteromedial drawer test for PCL, MCL or capsular involvement.  Negative posterolateral drawer test for PCL, LCL or capsular involvement.      Neurological Screen:        Dermatomes:  Within normal limits.        Reflexes:  2+  Functional Mobility:         Gait/Ambulation:     Base of Support: Center of gravity altered  Speed/Cadence: Pace decreased (<100 feet/min)  Step Length: Right shortened, Left shortened  Swing Pattern: Right asymmetrical, Left asymmetrical  Gait Abnormalities: Antalgic         Transfers:  Independent   Body Structures Involved:  1. Joints  2. Muscles Body Functions Affected:  1. Movement Related Activities and Participation Affected:  1. Mobility  2. Self Care   Number of elements (examined above) that affect the Plan of Care: 4+: HIGH COMPLEXITY   CLINICAL PRESENTATION:    Presentation: Evolving clinical presentation with changing clinical characteristics: MODERATE COMPLEXITY   CLINICAL DECISION MAKING:   Outcome Measure: Tool Used: Lower Extremity Functional Scale (LEFS)  Score:  Initial: 46/80 Most Recent: X/80 (Date: 03-20-16-- )   Interpretation of Score: 20 questions each scored on a 5 point scale with 0 representing "extreme difficulty or unable to perform" and 4 representing "no difficulty".  The lower the score, the greater the functional disability. 80/80 represents no disability.  Minimal detectable change is 9 points.  Score 80 79-63 62-48 47-32 31-16 15-1 0   Modifier CH CI CJ CK CL CM CN     Medical Necessity:   ?? Patient is expected to demonstrate progress in strength and range of motion to increase independence with daily activities.  ?? Patient demonstrates good rehab potential due to higher previous functional level.  ?? Skilled intervention continues to be required due to decreased strength and mobility.  Reason for Services/Other Comments:  ?? Patient continues to demonstrate capacity to improve overall mobility which will increase independence and increase safety.   Use of outcome tool(s) and clinical judgement create a POC that gives a: Questionable prediction of patient's progress: MODERATE COMPLEXITY            TREATMENT:   (In addition to Assessment/Re-Assessment sessions the following treatments were rendered)  Pre-treatment Symptoms/Complaints:  03/20/2016: "I went to the pool and it has helped a lot, I will continue that at home since it has been helping"  " I do want to see Anderson Malta one more time before Discharged".  Pain: Initial:     1-10 Post Session:  0/10     THERAPEUTIC EXERCISE: (40 minutes):  Exercises per grid below to improve mobility and strength.  Required minimal verbal and manual cues to promote proper body alignment, promote proper body posture and promote proper body mechanics.  Progressed resistance, range, repetitions and complexity of  movement as indicated.   Date:  03-20-16      Activity/Exercise Parameters      Airdyne-New step Dont do bike-irritated      Isometric hip flexion bilaterally 5 x 10 sec holds      Straight leg raise 3 pounds  20 reps      SKTC 10 sec holds 3 x bilaterally      Bridging X 10 reps      Tbands Rows-Extension X 20 reps each      Pelvic 20 reps  Active hamstring stretch 5 second hold  5 reps  B LE      Punches with blue tband X 20 reps                              Patient declined cold pack today.   Treatment/Session Assessment:    ?? Response to Treatment:  Patient doing well with PT, he continue's to have some swelling in bilateral feet.  He wants one more visit with original PT the beginning of June and then he will continue to do his exercises in the pool.   Decreased pain in back and knee's.    ?? Compliance with Program/Exercises: Compliant with HEP  ?? Recommendations/Intent for next treatment session: "Next visit will focus on advancements to more challenging activities".  Total Treatment Duration:  PT Patient Time In/Time Out  Time In: 0945  Time Out: [address], [place]

## 2016-03-22 ENCOUNTER — Encounter: Payer: PRIVATE HEALTH INSURANCE | Primary: Family Medicine

## 2016-03-29 NOTE — Progress Notes (Signed)
Therapy Center at [facility]   Medical Office [address]  [address], [address] [place] [id number]  Phone: [phone]   Fax: [phone]    OUTPATIENT DAILY NOTE    NAME/AGE/GENDER: Steven DuboisMichael L [name] is a 60 y.o. male.     DATE: 03/29/2016    Patient cancelled appointment for today due to hurting his back "very badly".  Will plan to follow up on next scheduled visit.    Allene DillonJennifer N Natausha Jungwirth, PT

## 2016-03-30 ENCOUNTER — Inpatient Hospital Stay: Payer: PRIVATE HEALTH INSURANCE | Primary: Family Medicine

## 2016-04-10 ENCOUNTER — Inpatient Hospital Stay: Admit: 2016-04-10 | Payer: PRIVATE HEALTH INSURANCE | Primary: Family Medicine

## 2016-04-10 ENCOUNTER — Ambulatory Visit
Admit: 2016-04-10 | Discharge: 2016-04-16 | Payer: PRIVATE HEALTH INSURANCE | Attending: Hematology & Oncology | Primary: Family Medicine

## 2016-04-10 DIAGNOSIS — D751 Secondary polycythemia: Secondary | ICD-10-CM

## 2016-04-10 LAB — CBC WITH AUTOMATED DIFF
ABS. BASOPHILS: 0.1 10*3/uL (ref 0.0–0.2)
ABS. EOSINOPHILS: 0.1 10*3/uL (ref 0.0–0.8)
ABS. LYMPHOCYTES: 1.8 10*3/uL (ref 0.5–4.6)
ABS. MONOCYTES: 0.7 10*3/uL (ref 0.1–1.3)
ABS. NEUTROPHILS: 3.6 10*3/uL (ref 1.7–8.2)
ABSOLUTE NRBC: 0.01 10*3/uL (ref 0.0–0.2)
BASOPHILS: 1 % (ref 0.0–2.0)
EOSINOPHILS: 2 % (ref 0.5–7.8)
HCT: 55.2 % — ABNORMAL HIGH (ref 41.1–50.3)
HGB: 19.6 g/dL — ABNORMAL HIGH (ref 13.6–17.2)
LYMPHOCYTES: 29 % (ref 13–44)
MCH: 31.1 PG (ref 26.1–32.9)
MCHC: 35.5 g/dL — ABNORMAL HIGH (ref 31.4–35.0)
MCV: 87.5 FL (ref 79.6–97.8)
MONOCYTES: 11 % (ref 4.0–12.0)
MPV: 9.8 FL — ABNORMAL LOW (ref 10.8–14.1)
NEUTROPHILS: 56 % (ref 43–78)
NRBC: 0.2 PER 100 WBC (ref 0.0–2.0)
PLATELET: 153 10*3/uL (ref 150–450)
RBC: 6.31 M/uL — ABNORMAL HIGH (ref 4.23–5.67)
RDW: 14.6 % (ref 11.9–14.6)
WBC: 6.3 10*3/uL (ref 4.3–11.1)

## 2016-04-10 LAB — METABOLIC PANEL, COMPREHENSIVE
A-G Ratio: 1.1 — ABNORMAL LOW (ref 1.2–3.5)
ALT (SGPT): 66 U/L — ABNORMAL HIGH (ref 12–65)
AST (SGOT): 41 U/L — ABNORMAL HIGH (ref 15–37)
Albumin: 3.9 g/dL (ref 3.5–5.0)
Alk. phosphatase: 81 U/L (ref 50–136)
Anion gap: 8 mmol/L (ref 7–16)
BUN: 25 MG/DL — ABNORMAL HIGH (ref 6–23)
Bilirubin, total: 0.5 MG/DL (ref 0.2–1.1)
CO2: 23 mmol/L (ref 21–32)
Calcium: 9.2 MG/DL (ref 8.3–10.4)
Chloride: 104 mmol/L (ref 98–107)
Creatinine: 1.35 MG/DL (ref 0.8–1.5)
GFR est AA: >60 mL/min/{1.73_m2} (ref >60)
GFR est non-AA: 58 mL/min/{1.73_m2} — ABNORMAL LOW (ref >60)
Globulin: 3.5 g/dL (ref 2.3–3.5)
Glucose: 110 mg/dL — ABNORMAL HIGH (ref 65–100)
Potassium: 4.3 mmol/L (ref 3.5–5.1)
Protein, total: 7.4 g/dL (ref 6.3–8.2)
Sodium: 135 mmol/L — ABNORMAL LOW (ref 136–145)

## 2016-04-10 NOTE — Progress Notes (Signed)
Data Source: Patient, [facility] record.    04/10/2016    10:23 AM    Steven [name] [id number]    60 y.o.      Patient Encounter: [facility] Visit    Heme Diagnosis:  polycythemia  Heme History (Copied from prior):   56 male, non-smoker, commercial sign business owner, h/o obesity, b/l knee arthroscopy (6/16), vit D deficiency, GERD, diverticulitis, allergic rhinitis, lower back pain related to herniated disc 's with plans to undergo laminectomy & fusion. Surgery was deferred related to insurance approval however was also found to have polycythemia, and as such referred to [place]. Review of labs reveals Hgb 18-19 range since 9/13. Hct 55-61 range. Other cell lines are normal. More recently has had some increase in his Cr 1.4-1.6. Recently seen by pulmonology, and diagnosed w moderate sleep apnea, and now on CPAP x 1 month, reports being regular w/ it as makes him sleep better. Notes chronic LE swelling x 3 yeas. Notes having had a detailed cardiac evaluation and was unremarkable.   Interval History:  06/17: Hct improved. Regular w CPAP. On testosterone Q7days: likely contributing to polycythemia, advised to discuss w PCP and stop replacement if possible: pt willing.   NCCN Distress Score:  -  REVIEW OF SYSTEMS:  As mentioned above, all other systems were reviewed in full and are negative.    Past Medical History:   Diagnosis Date   ??? Allergic rhinitis, cause unspecified    ??? BMI 40.0-44.9, adult ([place])    ??? Chronic left sacroiliac joint pain    ??? Chronic radicular low back pain         ??? Diverticulitis    ??? Essential hypertension, benign    ??? Generalized anxiety disorder    ??? GERD (gastroesophageal reflux disease)     occ no meds   ??? Hypercholesterolemia    ??? Left lumbar radiculitis     He has had low back pain that radiates down his LLE for years.   ??? 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 Mood disorder ([place]) 07/14/2014   ??? Morbid obesity ([place])     bmi = 40   ??? OSA on CPAP      wears cpap at hs   ??? Polycythemia ([place]) dx--06/2015    saw dr Luan Pulling-- thought related to sleep apnea   ??? Prediabetes    ??? Spondylolisthesis at L4-L5 level 10/19/2015   ??? Testicular hypofunction    ??? Venous insufficiency of both lower extremities 07/14/2014   ??? Vitamin D deficiency             Past Surgical History:   Procedure Laterality Date   ??? ENDOSCOPY, COLON, DIAGNOSTIC  2011    Diverticulosis   ??? HX COLONOSCOPY  05/2015    Diverticulosis   ??? HX KNEE ARTHROSCOPY Bilateral 04/2015    Partial medial and lateral meniscectomies by Dr. Eliseo Squires   ??? HX ORTHOPAEDIC Left 10/19/2015    L4-5 TLIF, decompression. Dr. Raul Del       Current Outpatient Prescriptions   Medication Sig Dispense Refill   ??? metFORMIN (GLUCOPHAGE) 1,000 mg tablet TAKE ONE TABLET BY MOUTH TWICE A DAY 60 Tab 3   ??? testosterone cypionate (DEPOTESTOTERONE CYPIONATE) 200 mg/mL injection INJECT 1/2 MILLILITER EVERY 7 DAYS 2 mL 5   ??? valsartan-hydroCHLOROthiazide (DIOVAN-HCT) 320-12.5 mg per tablet TAKE 1 TABLET BY MOUTH EVERY DAY 30 Tab 5   ??? metoprolol succinate (TOPROL-XL) 100 mg tablet Take 1  Tab by mouth nightly. Indications: hypertension 30 Tab 5   ??? pravastatin (PRAVACHOL) 40 mg tablet TAKE 1 TABLET BY MOUTH AT BEDTIME 30 Tab 5   ??? aspirin (ASPIRIN LOW-STRENGTH) 81 mg chewable tablet Take 81 mg by mouth every morning. Indications: continue     ??? MULTI-VITAMIN HI-PO PO Take 2 Tabs by mouth every morning. Indications: hold until after surgery     ??? traZODone (DESYREL) 50 mg tablet Take 1 Tab by mouth nightly. 30 Tab 1       Social History     Social History   ??? Marital status: MARRIED     Spouse name: N/A   ??? Number of children: N/A   ??? Years of education: N/A     Social History Main Topics   ??? Smoking status: Never Smoker   ??? Smokeless tobacco: Never Used   ??? Alcohol use 0.0 oz/week     0 Cans of beer per week      Comment: social   ??? Drug use: No   ??? Sexual activity: Not Asked     Other Topics Concern   ??? None     Social History Narrative        Family History   Problem Relation Age of Onset   ??? Diabetes Mother    ??? Alzheimer Mother    ??? Heart Disease Father      atherosclerosis (heavy smoker)   ??? Diabetes Sister    ??? Coronary Artery Disease Neg Hx    ??? Colon Cancer Neg Hx        Allergies   Allergen Reactions   ??? Nsaids (Non-Steroidal Anti-Inflammatory Drug) Other (comments)     Cause pedal edema if taken for a number of days in a row       PHYSICAL EXAMINATION:  General Appearance: Healthy appearing patient in no acute distress  Vitals reviewed.   Visit Vitals   ??? BP (!) 142/93  Comment: standing   ??? Pulse 75   ??? Resp 18   ??? Ht '5\' 10"'$  (1.778 m)   ??? Wt 287 lb 7 oz (130.4 kg)   ??? SpO2 96%   ??? BMI 41.24 kg/m2     HEENT: No oral or pharyngeal masses, ulceration or thrush noted, no sinus tenderness.  Neck is supple with no thyromegaly or JVD noted.  Lymph Nodes: No lymphadenopathy noted in the occipital, pre and post auricular, cervical, supra and infraclavicular, axillary, epitrochlear, inguinal, and popliteal region.  Breasts: No palpable masses, nipple discharge or skin retraction  Lungs/Thorax: Clear to auscultation, no accessory muscles of respiration being used.  Heart: Regular rate and rhythm, normal S1, S2, no appreciable murmurs, rubs, gallops  Abdomen: Soft, nontender, bowel sounds present, no appreciable hepatosplenomegaly, no palpable masses  Extremeties: Good pulses bilaterally, no peripheral edema.  Skin: Normal skin tone with no rash, petechiae, ecchymosis noted.  Musculoskeletal: No pain on palpation over bony prominence, no edema, no evidence of gout, no joint or bony deformity  Neurologic: Grossly intact        LABS/IMAGING:    Lab Results   Component Value Date/Time    WBC 6.3 04/10/2016 08:34 AM    HGB 19.6 04/10/2016 08:34 AM    HCT 55.2 04/10/2016 08:34 AM    PLATELET 153 04/10/2016 08:34 AM    MCV 87.5 04/10/2016 08:34 AM       Lab Results   Component Value Date/Time    Sodium 135 04/10/2016 08:34 AM  Potassium 4.3 04/10/2016 08:34 AM    Chloride 104 04/10/2016 08:34 AM    CO2 23 04/10/2016 08:34 AM    Anion gap 8 04/10/2016 08:34 AM    Glucose 110 04/10/2016 08:34 AM    BUN 25 04/10/2016 08:34 AM    Creatinine 1.35 04/10/2016 08:34 AM    BUN/Creatinine ratio 16 12/05/2015 09:58 AM    GFR est AA >60 04/10/2016 08:34 AM    GFR est non-AA 58 04/10/2016 08:34 AM    Calcium 9.2 04/10/2016 08:34 AM    AST (SGOT) 41 04/10/2016 08:34 AM    Alk. phosphatase 81 04/10/2016 08:34 AM    Protein, total 7.4 04/10/2016 08:34 AM    Albumin 3.9 04/10/2016 08:34 AM    Globulin 3.5 04/10/2016 08:34 AM    A-G Ratio 1.1 04/10/2016 08:34 AM    ALT (SGPT) 66 04/10/2016 08:34 AM       JAK2 testing:        Above results reviewed with patient.    ASSESSMENT:  13 male, non-smoker, commercial sign business owner, h/o obesity, b/l knee arthroscopy (6/16), vit D deficiency, GERD, diverticulitis, allergic rhinitis, lower back pain related to herniated disc 's with plans to undergo laminectomy & fusion. Surgery was deferred related to insurance approval however was also found to have polycythemia, and as such referred to [place]. Review of labs reveals Hgb 18-19 range since 9/13. Hct 55-61 range. Other cell lines are normal. More recently has had some increase in his Cr 1.4-1.6. Recently seen by pulmonology, and diagnosed w moderate sleep apnea, and now on CPAP x 1 month, reports being regular w/ it as makes him sleep better. Notes chronic LE swelling x 3 yeas. Notes having had a detailed cardiac evaluation and was unremarkable.     Polycythemia is likely secondary related to his OSA and should improve w regular use of CPAP. On once daily baby aspirin (for multiple years). Will r/o primary marrow condition as follows however if secondary polycythemia, then no particular directed therapy will be needed unless Hct over 65%.     09/07/15: Hct stable around 60. Peripheral blood smear w normal  morphology. Jak2 mutation analysis -ve. Epo high consistent w secondary polycythemia. Advised to continue CPAP and efforts directed at weight loss: Hct should improve over time. Continue daily aspirin. Hold phlebotomies unless hct increases over 65. Surgery end December. Will get CBC 1 week prior. Otherwise RTC in 3 months.     12/11/14: S/p back surgery w Dr Raul Del approx 5 weeks ago, recovering well w wound completely healed. Reports regular w CPAP. Back on daily baby aspirin. Hct better/improving. Lt leg swelling x 1 week: will get dopplers r/o DVT.     06/17: Hct improved. Regular w CPAP. On testosterone Q7days: likely contributing to polycythemia, advised to discuss w PCP and stop replacement if possible: pt willing.     PLAN:  - As above. Avised to consider stopping testosterone replacement. Serial monitoring of hct: phlebotomy only if hct over 65%  - daily CPAP, f/u w pulmonology as needed  - continue daily aspirin  - increased creatinine: referred to nephro in past    RTC in 3 months time w routine labs. Decrease frequency of visits thereafter .     I spent over 20 minutes with the patient out of which more than 12 were spent in face to face counseling.      Letha Cape, MD  [facility]  [phone]  [facility] [address] [place] [address]  Office : [phone]  Fax : [phone]

## 2016-04-10 NOTE — Patient Instructions (Addendum)
Patient Instructions From Your Nurse    Reason for Visit:  Follow up for polycythemia    Plan:  Your hematocrit is down to 55 today which is good!     Continue taking baby aspirin daily and wearing CPAP regularly.    We recommend stopping or at least cutting down the testosterone as much as you can. This can also increase all of your blood numbers along with the sleep apnea. Work with your primary care doctor to get off of this.    Follow Up:  Follow up with Dr. Faith Rogue in 3 months    Recent Lab Results:  Recent Results (from the past 12 hour(s))   CBC WITH AUTOMATED DIFF    Collection Time: 04/10/16  8:34 AM   Result Value Ref Range    WBC 6.3 4.3 - 11.1 K/uL    RBC 6.31 (H) 4.23 - 5.67 M/uL    HGB 19.6 (H) 13.6 - 17.2 g/dL    HCT 55.2 (H) 41.1 - 50.3 %    MCV 87.5 79.6 - 97.8 FL    MCH 31.1 26.1 - 32.9 PG    MCHC 35.5 (H) 31.4 - 35.0 g/dL    RDW 14.6 11.9 - 14.6 %    PLATELET 153 150 - 450 K/uL    MPV 9.8 (L) 10.8 - 14.1 FL    ABSOLUTE NRBC 0.01 0.0 - 0.2 K/uL    DF AUTOMATED      NEUTROPHILS 56 43 - 78 %    LYMPHOCYTES 29 13 - 44 %    MONOCYTES 11 4.0 - 12.0 %    EOSINOPHILS 2 0.5 - 7.8 %    BASOPHILS 1 0.0 - 2.0 %    ABS. NEUTROPHILS 3.6 1.7 - 8.2 K/UL    ABS. LYMPHOCYTES 1.8 0.5 - 4.6 K/UL    ABS. MONOCYTES 0.7 0.1 - 1.3 K/UL    ABS. EOSINOPHILS 0.1 0.0 - 0.8 K/UL    ABS. BASOPHILS 0.1 0.0 - 0.2 K/UL    NRBC 0.2 0.0 - 2.0 PER 371 WBC   METABOLIC PANEL, COMPREHENSIVE    Collection Time: 04/10/16  8:34 AM   Result Value Ref Range    Sodium 135 (L) 136 - 145 mmol/L    Potassium 4.3 3.5 - 5.1 mmol/L    Chloride 104 98 - 107 mmol/L    CO2 23 21 - 32 mmol/L    Anion gap 8 7 - 16 mmol/L    Glucose 110 (H) 65 - 100 mg/dL    BUN 25 (H) 6 - 23 MG/DL    Creatinine 1.35 0.8 - 1.5 MG/DL    GFR est AA >60 >60 ml/min/1.28m    GFR est non-AA 58 (L) >60 ml/min/1.780m   Calcium 9.2 8.3 - 10.4 MG/DL    Bilirubin, total 0.5 0.2 - 1.1 MG/DL    ALT (SGPT) 66 (H) 12 - 65 U/L    AST (SGOT) 41 (H) 15 - 37 U/L     Alk. phosphatase 81 50 - 136 U/L    Protein, total 7.4 6.3 - 8.2 g/dL    Albumin 3.9 3.5 - 5.0 g/dL    Globulin 3.5 2.3 - 3.5 g/dL    A-G Ratio 1.1 (L) 1.2 - 3.5         Care plan has been given to patient: n/a        -------------------------------------------------------------------------------------------------------------------  Please call our office at [phone] you have any  of the following symptoms:   ??  Fever of 100.5 or greater  ?? Chills  ?? Shortness of breath  ?? Swelling or pain in one leg    After office hours an answering service is available and will contact a provider for emergencies or if you are experiencing any of the above symptoms.    ? Patient did express an interest in My Chart.  My Chart log in information explained on the after visit summary printout at the [place] desk.    Loel Lofty, RN

## 2016-04-13 ENCOUNTER — Encounter

## 2016-05-07 ENCOUNTER — Ambulatory Visit

## 2016-05-07 ENCOUNTER — Inpatient Hospital Stay: Admit: 2016-05-07 | Payer: PRIVATE HEALTH INSURANCE | Primary: Family Medicine

## 2016-05-07 ENCOUNTER — Encounter

## 2016-05-07 ENCOUNTER — Ambulatory Visit
Admit: 2016-05-07 | Discharge: 2016-05-07 | Payer: PRIVATE HEALTH INSURANCE | Attending: Neurological Surgery | Primary: Family Medicine

## 2016-05-07 DIAGNOSIS — M4316 Spondylolisthesis, lumbar region: Secondary | ICD-10-CM

## 2016-05-07 DIAGNOSIS — M48061 Spinal stenosis, lumbar region without neurogenic claudication: Secondary | ICD-10-CM

## 2016-05-07 MED ORDER — DICLOFENAC 50 MG TAB, DELAYED RELEASE
50 mg | ORAL_TABLET | Freq: Two times a day (BID) | ORAL | 3 refills | Status: DC
Start: 2016-05-07 — End: 2016-05-31

## 2016-05-07 NOTE — Progress Notes (Signed)
History of Present Illness    The patient is a 60 y.o. male who returns about 6 months S/P an L4-5 minimally invasive TLIF and multilevel decompressions.  He continues to do very well. His only complaint involves some axial LBP that began when he tried cycling.  He has no sciatica.  He also has some pain in the soles of both feet.  His preop L ankle swelling has now returned.      Visit Vitals   ??? BP 133/70   ??? Temp 98.6 ??F (37 ??C)   ??? Ht 5\' 10"  (1.778 m)   ??? Wt 285 lb (129.3 kg)   ??? BMI 40.89 kg/m2         Physical Exam  The physical exam findings are as follows:  Cranial Nerves:   Intact visual fields. Fundi are benign. PERLA, EOM's full, no nystagmus, no ptosis. Facial sensation is normal. Corneal reflexes are intact. Facial movement is symmetric. Hearing is normal bilaterally. Palate is midline with normal sternocleidomastoid and trapezius muscles are normal. Tongue is midline.   Motor:  5/5 strength in upper and lower proximal and distal muscles. Normal bulk and tone. No fasciculations.   Reflexes:   Deep tendon reflexes 2+/4 and symmetrical. No pathologic reflexes present.   Sensory:   Normal to touch, pinprick and vibration.   Gait:  Normal gait.   Tremor:   No tremor noted.   Cerebellar:  No cerebellar signs present.  GCS15, A&Ox3.  Mild distal LLE edema.         Assessment & Plan  The new L-spine films reveal a good progressing bony fusion with the hardware in good position.    He continues to heal nicely.  I have given him a set of back exercises and have encouraged him to swim to help with the axial LBP.  I have also given him a script for Voltaren 50 mg bid.  He will get a fitted compression stocking to wear on the LLE.  He will return to see me in 6 months with new L-spine films to be done at that time.        ICD-10-CM ICD-9-CM    1. Lumbar stenosis M48.06 724.02 AMB SUPPLY ORDER      XR SPINE LUMB 2 OR 3 V   2. Spondylolisthesis at L4-L5 level M43.16 756.12 AMB SUPPLY ORDER       XR SPINE LUMB 2 OR 3 V       I have spent more that 50% of this 45 minute visit discussing extensively with the patient various treatment options, alternative treatments, diagnostic studies and explained the findings of x-rays, MRI's, CT scans and other data such as EMG's prior notes, operative reports, etc.    Follow-up Disposition:  Return in about 6 months (around 11/07/2016).

## 2016-05-10 ENCOUNTER — Encounter

## 2016-05-14 ENCOUNTER — Encounter

## 2016-05-14 MED ORDER — METFORMIN 1,000 MG TAB
1000 mg | ORAL_TABLET | ORAL | 0 refills | Status: DC
Start: 2016-05-14 — End: 2016-05-31

## 2016-05-14 NOTE — Telephone Encounter (Signed)
appt 05/31/16

## 2016-05-31 ENCOUNTER — Ambulatory Visit
Admit: 2016-05-31 | Discharge: 2016-05-31 | Payer: PRIVATE HEALTH INSURANCE | Attending: Family Medicine | Primary: Family Medicine

## 2016-05-31 DIAGNOSIS — I10 Essential (primary) hypertension: Secondary | ICD-10-CM

## 2016-05-31 LAB — AMB POC COMPLETE CBC,AUTOMATED ENTER
ABS. GRANS (POC): 3.9 10*3/uL (ref 1.4–6.5)
ABS. LYMPHS (POC): 2.1 10*3/uL (ref 1.2–3.4)
ABS. MONOS (POC): 0.7 10*3/uL — AB (ref 0.1–0.6)
GRANULOCYTES (POC): 58.1 % (ref 42.2–75.2)
HCT (POC): 55.8 % — AB (ref 40.0–54.0)
HGB (POC): 19.4 g/dL — AB (ref 13.5–18.0)
LYMPHOCYTES (POC): 31.1 % (ref 20.0–45.0)
MCH (POC): 31.5 pg (ref 26.0–33.0)
MCHC (POC): 34.8 g/dL (ref 31.0–36.0)
MCV (POC): 90.4 fL (ref 80.0–94.0)
MONOCYTES (POC): 10.8 % (ref 1.0–13.0)
MPV (POC): 8 fL (ref 7.4–10.4)
PLATELET (POC): 140 10*3/uL (ref 140–440)
RBC (POC): 6.17 10*6/uL (ref 4.60–6.20)
RDW (POC): 14.9 % (ref 11.0–16.0)
WBC (POC): 6.7 10*3/uL (ref 4.5–11.0)

## 2016-05-31 LAB — AMB POC URINALYSIS DIP STICK MANUAL W/ MICRO
Bilirubin (UA POC): NEGATIVE
Blood (UA POC): NEGATIVE
Crystals (UA POC): NEGATIVE
Glucose (UA POC): NEGATIVE
Ketones (UA POC): NEGATIVE
Leukocyte esterase (UA POC): NEGATIVE
Nitrites (UA POC): NEGATIVE
Protein (UA POC): NEGATIVE mg/dL
Specific gravity (UA POC): 1.01 (ref 1.001–1.035)
Urobilinogen (UA POC): 0.2 (ref 0.2–1)
pH (UA POC): 5 (ref 4.6–8.0)

## 2016-05-31 MED ORDER — VALSARTAN-HYDROCHLOROTHIAZIDE 320 MG-12.5 MG TAB
ORAL_TABLET | ORAL | 5 refills | Status: DC
Start: 2016-05-31 — End: 2016-12-18

## 2016-05-31 MED ORDER — NAPROXEN 500 MG-ESOMEPRAZOLE 20 MG TABLET,IMMEDIATE AND DELAY RELEASE
500-20 mg | ORAL | 0 refills | Status: DC
Start: 2016-05-31 — End: 2016-08-14

## 2016-05-31 MED ORDER — METFORMIN 1,000 MG TAB
1000 mg | ORAL_TABLET | ORAL | 5 refills | Status: DC
Start: 2016-05-31 — End: 2016-12-20

## 2016-05-31 MED ORDER — METOPROLOL SUCCINATE SR 100 MG 24 HR TAB
100 mg | ORAL_TABLET | Freq: Every evening | ORAL | 5 refills | Status: DC
Start: 2016-05-31 — End: 2016-12-20

## 2016-05-31 MED ORDER — TESTOSTERONE CYPIONATE 200 MG/ML IM OIL
200 mg/mL | INTRAMUSCULAR | 5 refills | Status: DC
Start: 2016-05-31 — End: 2016-11-22

## 2016-05-31 MED ORDER — PRAVASTATIN 40 MG TAB
40 mg | ORAL_TABLET | ORAL | 5 refills | Status: DC
Start: 2016-05-31 — End: 2016-12-20

## 2016-05-31 NOTE — Progress Notes (Signed)
HISTORY OF PRESENT ILLNESS  Steven [name] is a 60 y.o. male.  HPI   Chronic f/u - HLP, Prediabetes, low testosterone, HTN:   Doing well on injections and current meds, no se's.     Left ankle swelling - worse by end of day, been a couple of years. Staying same. Not painful, but hard to put shoes on. Better w/ elevation or after night of sleep. Had a blood clot in leg last year left leg after surgery he says.   nsaid works occasionally for pain but upsets stomach  Trazodone didn't help so stopped it, prefers non med. Uses melatonin or benadryl        Review of Systems   Constitutional: Negative for chills, fever and malaise/fatigue.   HENT: Negative for congestion, ear pain and sore throat.    Eyes: Negative for blurred vision, pain and redness.   Respiratory: Negative for cough and shortness of breath.    Cardiovascular: Positive for leg swelling. Negative for chest pain and palpitations.   Gastrointestinal: Negative for abdominal pain, nausea and vomiting.   Genitourinary: Negative for dysuria.   Musculoskeletal: Negative for back pain and myalgias.   Skin: Negative for rash.   Neurological: Negative for dizziness and headaches.   Psychiatric/Behavioral: Negative for depression.     Visit Vitals   ??? BP 126/64 (BP 1 Location: Right arm, BP Patient Position: Sitting)   ??? Pulse 73   ??? Temp 95.4 ??F (35.2 ??C) (Oral)   ??? Ht 5\' 10"  (1.778 m)   ??? Wt 283 lb (128.4 kg)   ??? SpO2 98%   ??? BMI 40.61 kg/m2      Physical Exam   Constitutional: He is oriented to person, place, and time. He appears well-developed and well-nourished. No distress.   HENT:   Head: Normocephalic and atraumatic.   Neck: No JVD present. No thyromegaly present.   Cardiovascular: Normal rate, regular rhythm, normal heart sounds and intact distal pulses.  Exam reveals no gallop and no friction rub.    No murmur heard.  Pulmonary/Chest: Effort normal and breath sounds normal. He has no wheezes.    Neurological: He is alert and oriented to person, place, and time.   Skin: Skin is warm and dry.   Nursing note and vitals reviewed.      ASSESSMENT and PLAN    ICD-10-CM ICD-9-CM    1. Essential hypertension, benign I10 401.1 metoprolol succinate (TOPROL-XL) 100 mg tablet      valsartan-hydroCHLOROthiazide (DIOVAN-HCT) 320-12.5 mg per tablet      AMB POC COMPLETE CBC,AUTOMATED ENTER      AMB POC URINALYSIS DIP STICK MANUAL W/ MICRO      METABOLIC PANEL, COMPREHENSIVE   2. 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 Pure hypercholesterolemia E78.00 272.0 pravastatin (PRAVACHOL) 40 mg tablet   4. Pre-diabetes R73.03 790.29 metFORMIN (GLUCOPHAGE) 1,000 mg tablet      HEMOGLOBIN A1C WITH EAG   5. Left leg swelling M79.89 729.81    6. Lumbar stenosis with neurogenic claudication M48.06 724.03 Naproxen-Esomeprazole Mag (VIMOVO) 500-20 mg TbID   Check labs, adjust meds if needed. 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 Rec wear rx compression hose regularly, elevate legs. If develops acute change like pain, increased/persistent swelling, redness RTC immediately.  Cont meds, encouraged  cont to exercise.  Samples of vimovo given to use prn instead of NSAID alone to help with stomach side effects.   25 minutes spent counseling patient today.

## 2016-06-01 LAB — METABOLIC PANEL, COMPREHENSIVE
A-G Ratio: 1.7 (ref 1.2–2.2)
ALT (SGPT): 47 IU/L — ABNORMAL HIGH (ref 0–44)
AST (SGOT): 28 IU/L (ref 0–40)
Albumin: 4.5 g/dL (ref 3.5–5.5)
Alk. phosphatase: 86 IU/L (ref 39–117)
BUN/Creatinine ratio: 21 — ABNORMAL HIGH (ref 9–20)
BUN: 27 mg/dL — ABNORMAL HIGH (ref 6–24)
Bilirubin, total: <0.2 mg/dL (ref 0.0–1.2)
CO2: 15 mmol/L — ABNORMAL LOW (ref 18–29)
Calcium: 10.2 mg/dL (ref 8.7–10.2)
Chloride: 100 mmol/L (ref 96–106)
Creatinine: 1.26 mg/dL (ref 0.76–1.27)
GFR est AA: 72 mL/min/{1.73_m2} (ref >59)
GFR est non-AA: 62 mL/min/{1.73_m2} (ref >59)
GLOBULIN, TOTAL: 2.7 g/dL (ref 1.5–4.5)
Glucose: 110 mg/dL — ABNORMAL HIGH (ref 65–99)
Potassium: 4.6 mmol/L (ref 3.5–5.2)
Protein, total: 7.2 g/dL (ref 6.0–8.5)
Sodium: 140 mmol/L (ref 134–144)

## 2016-06-01 LAB — HEMOGLOBIN A1C WITH EAG
Estimated average glucose: 140 mg/dL
Hemoglobin A1c: 6.5 % — ABNORMAL HIGH (ref 4.8–5.6)

## 2016-06-01 LAB — PSA, DIAGNOSTIC (PROSTATE SPECIFIC AG): Prostate Specific Ag: 0.3 ng/mL (ref 0.0–4.0)

## 2016-06-01 LAB — TESTOSTERONE, TOTAL, ADULT MALE: Testosterone: 145 ng/dL — ABNORMAL LOW (ref 264–916)

## 2016-06-20 ENCOUNTER — Encounter

## 2016-07-04 ENCOUNTER — Encounter

## 2016-07-04 NOTE — Telephone Encounter (Signed)
Knee pain is worse. Refer to Dr. Melanee Left for ortho.

## 2016-07-04 NOTE — Telephone Encounter (Signed)
Done

## 2016-07-17 ENCOUNTER — Encounter: Attending: Hematology & Oncology | Primary: Family Medicine

## 2016-07-17 ENCOUNTER — Encounter: Primary: Family Medicine

## 2016-08-14 ENCOUNTER — Encounter

## 2016-08-14 MED ORDER — DICLOFENAC 50 MG TAB, DELAYED RELEASE
50 mg | ORAL_TABLET | Freq: Two times a day (BID) | ORAL | 2 refills | Status: DC
Start: 2016-08-14 — End: 2019-08-28

## 2016-08-14 NOTE — Telephone Encounter (Signed)
Auto refill req for 90 day supply for Voltaren- refilled per standing order

## 2016-09-11 ENCOUNTER — Encounter: Primary: Family Medicine

## 2016-09-11 ENCOUNTER — Encounter: Payer: PRIVATE HEALTH INSURANCE | Attending: Hematology & Oncology | Primary: Family Medicine

## 2016-09-17 ENCOUNTER — Ambulatory Visit
Admit: 2016-09-17 | Discharge: 2016-09-17 | Payer: PRIVATE HEALTH INSURANCE | Attending: Family Medicine | Primary: Family Medicine

## 2016-09-17 DIAGNOSIS — J209 Acute bronchitis, unspecified: Secondary | ICD-10-CM

## 2016-09-17 MED ORDER — AMOXICILLIN CLAVULANATE 875 MG-125 MG TAB
875-125 mg | ORAL_TABLET | Freq: Two times a day (BID) | ORAL | 0 refills | Status: AC
Start: 2016-09-17 — End: 2016-09-27

## 2016-09-17 NOTE — Progress Notes (Signed)
HISTORY OF PRESENT ILLNESS  Steven DuboisMichael L [name] is a 60 y.o. male.  HPI   The patient is a 60 y.o. male who is seen for evaluation of productive cough. Symptoms began 1 week ago and are gradually worsening since that time.  Treatment to date: OTC products.  About 1 week had symptoms. They are going to visit brother in law who is sick with cancer. Sinus congestion and coughing. Took dayquil, afrin x 1 week - has felt a little off/anxious so thinks that is why BP is up. Coughing up sputum - was clear and now is green.   No fevers. Sometimes feels like ear needs to pop.     Review of Systems   Constitutional: Negative for chills, fever and malaise/fatigue.   HENT: Negative for congestion, ear pain and sore throat.    Eyes: Negative for blurred vision, pain and redness.   Respiratory: Positive for cough and sputum production. Negative for shortness of breath.    Cardiovascular: Negative for chest pain and palpitations.   Gastrointestinal: Negative for abdominal pain, nausea and vomiting.   Genitourinary: Negative for dysuria.   Musculoskeletal: Negative for back pain and myalgias.   Skin: Negative for rash.   Neurological: Negative for dizziness and headaches.   Psychiatric/Behavioral: Negative for depression.     Visit Vitals   ??? BP 160/82 (BP 1 Location: Right arm, BP Patient Position: Sitting)   ??? Pulse 84   ??? Temp 95.2 ??F (35.1 ??C) (Oral)   ??? Ht 5\' 10"  (1.778 m)   ??? Wt 287 lb (130.2 kg)   ??? SpO2 97%   ??? BMI 41.18 kg/m2      Physical Exam   Constitutional: He appears well-developed and well-nourished. No distress.   HENT:   Head: Normocephalic and atraumatic.   Right Ear: Hearing, tympanic membrane, external ear and ear canal normal.   Left Ear: Hearing, tympanic membrane, external ear and ear canal normal.   Nose: Mucosal edema and rhinorrhea present. No sinus tenderness.   Mouth/Throat: Posterior oropharyngeal erythema present.   Cardiovascular: Normal rate, regular rhythm and normal heart sounds.     Pulmonary/Chest: Effort normal and breath sounds normal. No respiratory distress. He has no wheezes. He has no rhonchi. He has no rales.   Lymphadenopathy:     He has cervical adenopathy.   Nursing note and vitals reviewed.      ASSESSMENT and PLAN    ICD-10-CM ICD-9-CM    1. Acute bronchitis, unspecified organism J20.9 466.0 amoxicillin-clavulanate (AUGMENTIN) 875-125 mg per tablet   tx with augmentin, call if not improving  Went over OTC meds to take for symptoms, avoid afrin and decongestants.   Has f/u in jan for labs and HTN

## 2016-10-04 ENCOUNTER — Encounter: Payer: PRIVATE HEALTH INSURANCE | Attending: Hematology & Oncology | Primary: Family Medicine

## 2016-10-04 ENCOUNTER — Encounter: Primary: Family Medicine

## 2016-10-10 ENCOUNTER — Inpatient Hospital Stay: Admit: 2016-10-10 | Payer: PRIVATE HEALTH INSURANCE | Primary: Family Medicine

## 2016-10-10 ENCOUNTER — Ambulatory Visit
Admit: 2016-10-10 | Discharge: 2016-10-10 | Payer: PRIVATE HEALTH INSURANCE | Attending: Hematology & Oncology | Primary: Family Medicine

## 2016-10-10 DIAGNOSIS — D751 Secondary polycythemia: Secondary | ICD-10-CM

## 2016-10-10 LAB — METABOLIC PANEL, COMPREHENSIVE
A-G Ratio: 1.1 — ABNORMAL LOW (ref 1.2–3.5)
ALT (SGPT): 59 U/L (ref 12–65)
AST (SGOT): 35 U/L (ref 15–37)
Albumin: 4 g/dL (ref 3.5–5.0)
Alk. phosphatase: 76 U/L (ref 50–136)
Anion gap: 9 mmol/L (ref 7–16)
BUN: 19 MG/DL (ref 6–23)
Bilirubin, total: 0.7 MG/DL (ref 0.2–1.1)
CO2: 25 mmol/L (ref 21–32)
Calcium: 8.7 MG/DL (ref 8.3–10.4)
Chloride: 102 mmol/L (ref 98–107)
Creatinine: 1.31 MG/DL (ref 0.8–1.5)
GFR est AA: >60 mL/min/{1.73_m2} (ref >60)
GFR est non-AA: 60 mL/min/{1.73_m2} — ABNORMAL LOW (ref >60)
Globulin: 3.6 g/dL — ABNORMAL HIGH (ref 2.3–3.5)
Glucose: 119 mg/dL — ABNORMAL HIGH (ref 65–100)
Potassium: 4.4 mmol/L (ref 3.5–5.1)
Protein, total: 7.6 g/dL (ref 6.3–8.2)
Sodium: 136 mmol/L (ref 136–145)

## 2016-10-10 LAB — CBC WITH AUTOMATED DIFF
ABS. BASOPHILS: 0.1 10*3/uL (ref 0.0–0.2)
ABS. EOSINOPHILS: 0.1 10*3/uL (ref 0.0–0.8)
ABS. LYMPHOCYTES: 1.6 10*3/uL (ref 0.5–4.6)
ABS. MONOCYTES: 0.6 10*3/uL (ref 0.1–1.3)
ABS. NEUTROPHILS: 4 10*3/uL (ref 1.7–8.2)
ABSOLUTE NRBC: 0 10*3/uL (ref 0.0–0.2)
BASOPHILS: 1 % (ref 0.0–2.0)
EOSINOPHILS: 2 % (ref 0.5–7.8)
HCT: 55.1 % — ABNORMAL HIGH (ref 41.1–50.3)
HGB: 19.2 g/dL — ABNORMAL HIGH (ref 13.6–17.2)
LYMPHOCYTES: 25 % (ref 13–44)
MCH: 31.3 PG (ref 26.1–32.9)
MCHC: 34.8 g/dL (ref 31.4–35.0)
MCV: 89.9 FL (ref 79.6–97.8)
MONOCYTES: 10 % (ref 4.0–12.0)
MPV: 10.5 FL — ABNORMAL LOW (ref 10.8–14.1)
NEUTROPHILS: 62 % (ref 43–78)
PLATELET: 155 10*3/uL (ref 150–450)
RBC: 6.13 M/uL — ABNORMAL HIGH (ref 4.23–5.67)
RDW: 13.6 % (ref 11.9–14.6)
WBC: 6.4 10*3/uL (ref 4.3–11.1)

## 2016-10-10 NOTE — Progress Notes (Signed)
Data Source: Patient, [facility] record.    10/10/2016    10:23 AM    Steven [name] [id number]    60 y.o.      Patient Encounter: [facility] Visit    Heme Diagnosis:  polycythemia  Heme History (Copied from prior):   76 male, non-smoker, commercial sign business owner, h/o obesity, b/l knee arthroscopy (6/16), vit D deficiency, GERD, diverticulitis, allergic rhinitis, lower back pain related to herniated disc 's with plans to undergo laminectomy & fusion. Surgery was deferred related to insurance approval however was also found to have polycythemia, and as such referred to [place]. Review of labs reveals Hgb 18-19 range since 9/13. Hct 55-61 range. Other cell lines are normal. More recently has had some increase in his Cr 1.4-1.6. Recently seen by pulmonology, and diagnosed w moderate sleep apnea, and now on CPAP x 1 month, reports being regular w/ it as makes him sleep better. Notes chronic LE swelling x 3 yeas. Notes having had a detailed cardiac evaluation and was unremarkable.   Interval History:  12/17: Doing well. Remains on testosterone, per pt as felt will help w bone healing per neurosurgery. Will discuss w/ them next visit (in a months time) as surgery almost 1 year out. Will check Jak2 Exon 12,CALR, MPL, and CT CAP (hydration protocol) given hct remains high inspite of CPAP.   NCCN Distress Score:  -  REVIEW OF SYSTEMS:  As mentioned above, all other systems were reviewed in full and are negative.    Past Medical History:   Diagnosis Date   ??? Allergic rhinitis, cause unspecified    ??? BMI 40.0-44.9, adult ([place])    ??? Chronic left sacroiliac joint pain    ??? Chronic radicular low back pain         ??? Diverticulitis    ??? Essential hypertension, benign    ??? Generalized anxiety disorder    ??? GERD (gastroesophageal reflux disease)     occ no meds   ??? Hypercholesterolemia    ??? Left lumbar radiculitis     He has had low back pain that radiates down his LLE for years.    ??? 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 Mood disorder ([place]) 07/14/2014   ??? Morbid obesity ([place])     bmi = 40   ??? OSA on CPAP     wears cpap at hs   ??? Polycythemia dx--06/2015    saw dr Luan Pulling-- thought related to sleep apnea   ??? Prediabetes    ??? Spondylolisthesis at L4-L5 level 10/19/2015   ??? Testicular hypofunction    ??? Thrombophlebitis of left leg ([facility]) 04/2015    posterior tibial vein and peroneal vein   ??? Venous insufficiency of both lower extremities 07/14/2014   ??? Vitamin D deficiency             Past Surgical History:   Procedure Laterality Date   ??? ENDOSCOPY, COLON, DIAGNOSTIC  2011    Diverticulosis   ??? HX COLONOSCOPY  05/2015    Diverticulosis   ??? HX KNEE ARTHROSCOPY Bilateral 04/2015    Partial medial and lateral meniscectomies by Dr. Eliseo Squires   ??? HX ORTHOPAEDIC Left 10/19/2015    L4-5 TLIF, decompression. Dr. Raul Del       Current Outpatient Prescriptions   Medication Sig Dispense Refill   ??? diclofenac EC (VOLTAREN) 50 mg EC tablet Take 1 Tab by mouth two (2) times a day. 180 Tab 2   ???  testosterone cypionate (DEPOTESTOTERONE CYPIONATE) 200 mg/mL injection INJECT 1/2 MILLILITER EVERY 7 DAYS 2 mL 5   ??? pravastatin (PRAVACHOL) 40 mg tablet TAKE 1 TABLET BY MOUTH AT BEDTIME 30 Tab 5   ??? metFORMIN (GLUCOPHAGE) 1,000 mg tablet TAKE ONE TABLET BY MOUTH TWICE A DAY 60 Tab 5   ??? metoprolol succinate (TOPROL-XL) 100 mg tablet Take 1 Tab by mouth nightly. Indications: hypertension 30 Tab 5   ??? valsartan-hydroCHLOROthiazide (DIOVAN-HCT) 320-12.5 mg per tablet TAKE 1 TABLET BY MOUTH EVERY DAY 30 Tab 5   ??? aspirin (ASPIRIN LOW-STRENGTH) 81 mg chewable tablet Take 81 mg by mouth every morning. Indications: continue     ??? MULTI-VITAMIN HI-PO PO Take 2 Tabs by mouth every morning. Indications: hold until after surgery         Social History     Social History   ??? Marital status: MARRIED     Spouse name: N/A   ??? Number of children: N/A   ??? Years of education: N/A     Social History Main Topics    ??? Smoking status: Never Smoker   ??? Smokeless tobacco: Never Used   ??? Alcohol use 0.0 oz/week     0 Cans of beer per week      Comment: social   ??? Drug use: No   ??? Sexual activity: Not Asked     Other Topics Concern   ??? None     Social History Narrative       Family History   Problem Relation Age of Onset   ??? Diabetes Mother    ??? Alzheimer Mother    ??? Heart Disease Father      atherosclerosis (heavy smoker)   ??? Diabetes Sister    ??? Coronary Artery Disease Neg Hx    ??? Colon Cancer Neg Hx        Allergies   Allergen Reactions   ??? Nsaids (Non-Steroidal Anti-Inflammatory Drug) Other (comments)     Cause pedal edema if taken for a number of days in a row       PHYSICAL EXAMINATION:  General Appearance: Healthy appearing patient in no acute distress  Vitals reviewed.   Visit Vitals   ??? BP 135/85  Comment: standing   ??? Pulse 69   ??? Temp 98.3 ??F (36.8 ??C) (Oral)   ??? Resp 18   ??? Ht 5' 10"  (1.778 m)   ??? Wt 282 lb 2 oz (128 kg)   ??? SpO2 96%   ??? BMI 40.48 kg/m2     HEENT: No oral or pharyngeal masses, ulceration or thrush noted, no sinus tenderness.  Neck is supple with no thyromegaly or JVD noted.  Lymph Nodes: No lymphadenopathy noted in the occipital, pre and post auricular, cervical, supra and infraclavicular, axillary, epitrochlear, inguinal, and popliteal region.  Breasts: No palpable masses, nipple discharge or skin retraction  Lungs/Thorax: Clear to auscultation, no accessory muscles of respiration being used.  Heart: Regular rate and rhythm, normal S1, S2, no appreciable murmurs, rubs, gallops  Abdomen: Soft, nontender, bowel sounds present, no appreciable hepatosplenomegaly, no palpable masses  Extremeties: Good pulses bilaterally, no peripheral edema.  Skin: Normal skin tone with no rash, petechiae, ecchymosis noted.  Musculoskeletal: No pain on palpation over bony prominence, no edema, no evidence of gout, no joint or bony deformity  Neurologic: Grossly intact        LABS/IMAGING:    Lab Results    Component Value Date/Time    WBC 6.4  10/10/2016 08:29 AM    HGB 19.2 10/10/2016 08:29 AM    HCT 55.1 10/10/2016 08:29 AM    PLATELET 155 10/10/2016 08:29 AM    MCV 89.9 10/10/2016 08:29 AM       Lab Results   Component Value Date/Time    Sodium 136 10/10/2016 08:29 AM    Potassium 4.4 10/10/2016 08:29 AM    Chloride 102 10/10/2016 08:29 AM    CO2 25 10/10/2016 08:29 AM    Anion gap 9 10/10/2016 08:29 AM    Glucose 119 10/10/2016 08:29 AM    BUN 19 10/10/2016 08:29 AM    Creatinine 1.31 10/10/2016 08:29 AM    BUN/Creatinine ratio 21 05/31/2016 10:23 AM    GFR est AA >60 10/10/2016 08:29 AM    GFR est non-AA 60 10/10/2016 08:29 AM    Calcium 8.7 10/10/2016 08:29 AM    AST (SGOT) 35 10/10/2016 08:29 AM    Alk. phosphatase 76 10/10/2016 08:29 AM    Protein, total 7.6 10/10/2016 08:29 AM    Albumin 4.0 10/10/2016 08:29 AM    Globulin 3.6 10/10/2016 08:29 AM    A-G Ratio 1.1 10/10/2016 08:29 AM    ALT (SGPT) 59 10/10/2016 08:29 AM       JAK2 testing:        Above results reviewed with patient.    ASSESSMENT:  45 male, non-smoker, commercial sign business owner, h/o obesity, b/l knee arthroscopy (6/16), vit D deficiency, GERD, diverticulitis, allergic rhinitis, lower back pain related to herniated disc 's with plans to undergo laminectomy & fusion. Surgery was deferred related to insurance approval however was also found to have polycythemia, and as such referred to [place]. Review of labs reveals Hgb 18-19 range since 9/13. Hct 55-61 range. Other cell lines are normal. More recently has had some increase in his Cr 1.4-1.6. Recently seen by pulmonology, and diagnosed w moderate sleep apnea, and now on CPAP x 1 month, reports being regular w/ it as makes him sleep better. Notes chronic LE swelling x 3 yeas. Notes having had a detailed cardiac evaluation and was unremarkable.     Polycythemia is likely secondary related to his OSA and should improve w regular use of CPAP. On once daily baby aspirin (for multiple years). Will  r/o primary marrow condition as follows however if secondary polycythemia, then no particular directed therapy will be needed unless Hct over 65%.     09/07/15: Hct stable around 60. Peripheral blood smear w normal morphology. Jak2 mutation analysis -ve. Epo high consistent w secondary polycythemia. Advised to continue CPAP and efforts directed at weight loss: Hct should improve over time. Continue daily aspirin. Hold phlebotomies unless hct increases over 65. Surgery end December. Will get CBC 1 week prior. Otherwise RTC in 3 months.     12/11/14: S/p back surgery w Dr Raul Del approx 5 weeks ago, recovering well w wound completely healed. Reports regular w CPAP. Back on daily baby aspirin. Hct better/improving. Lt leg swelling x 1 week: will get dopplers r/o DVT.     06/17: Hct improved. Regular w CPAP. On testosterone Q7days: likely contributing to polycythemia, advised to discuss w PCP and stop replacement if possible: pt willing.     12/17: Doing well. Remains on testosterone, per pt as felt will help w bone healing per neurosurgery. Will discuss w/ them next visit (in a months time) as surgery almost 1 year out. Will check Jak2 Exon 12,CALR, MPL, and CT CAP (hydration protocol) given hct  remains high inspite of CPAP.     PLAN:  - As above. Avised to consider stopping testosterone replacement. Serial monitoring of hct: phlebotomy only if hct over 65%  - Daily CPAP, f/u w pulmonology as needed  - Continue daily aspirin  - Increased creatinine: referred to nephro in past    RTC in 1 month time w routine labs, Jak2 exon 12, CALR, MPL, CT CAP    I spent over 20 minutes with the patient out of which more than 12 were spent in face to face counseling.    Letha Cape, MD  [facility]  [facility]  [address] [place] [address]  Office : [phone]  Fax : [phone]

## 2016-10-10 NOTE — Patient Instructions (Addendum)
Patient Instructions From Your Nurse    Reason for Visit:  Follow up for polycythemia    Plan:  We recommend getting off of the testosterone. Hopefully Dr. Raul Del will say that is okay when you see her in January.    Since your numbers are still high, we will be extra thorough    Follow Up:  Follow up with Dr. Faith Rogue in about a month    Recent Lab Results:  Recent Results (from the past 12 hour(s))   CBC WITH AUTOMATED DIFF    Collection Time: 10/10/16  8:29 AM   Result Value Ref Range    WBC 6.4 4.3 - 11.1 K/uL    RBC 6.13 (H) 4.23 - 5.67 M/uL    HGB 19.2 (H) 13.6 - 17.2 g/dL    HCT 55.1 (H) 41.1 - 50.3 %    MCV 89.9 79.6 - 97.8 FL    MCH 31.3 26.1 - 32.9 PG    MCHC 34.8 31.4 - 35.0 g/dL    RDW 13.6 11.9 - 14.6 %    PLATELET 155 150 - 450 K/uL    MPV 10.5 (L) 10.8 - 14.1 FL    ABSOLUTE NRBC 0.00 0.0 - 0.2 K/uL    DF AUTOMATED      NEUTROPHILS 62 43 - 78 %    LYMPHOCYTES 25 13 - 44 %    MONOCYTES 10 4.0 - 12.0 %    EOSINOPHILS 2 0.5 - 7.8 %    BASOPHILS 1 0.0 - 2.0 %    ABS. NEUTROPHILS 4.0 1.7 - 8.2 K/UL    ABS. LYMPHOCYTES 1.6 0.5 - 4.6 K/UL    ABS. MONOCYTES 0.6 0.1 - 1.3 K/UL    ABS. EOSINOPHILS 0.1 0.0 - 0.8 K/UL    ABS. BASOPHILS 0.1 0.0 - 0.2 K/UL   METABOLIC PANEL, COMPREHENSIVE    Collection Time: 10/10/16  8:29 AM   Result Value Ref Range    Sodium 136 136 - 145 mmol/L    Potassium 4.4 3.5 - 5.1 mmol/L    Chloride 102 98 - 107 mmol/L    CO2 25 21 - 32 mmol/L    Anion gap 9 7 - 16 mmol/L    Glucose 119 (H) 65 - 100 mg/dL    BUN 19 6 - 23 MG/DL    Creatinine 1.31 0.8 - 1.5 MG/DL    GFR est AA >60 >60 ml/min/1.68m    GFR est non-AA 60 (L) >60 ml/min/1.725m   Calcium 8.7 8.3 - 10.4 MG/DL    Bilirubin, total 0.7 0.2 - 1.1 MG/DL    ALT (SGPT) 59 12 - 65 U/L    AST (SGOT) 35 15 - 37 U/L    Alk. phosphatase 76 50 - 136 U/L    Protein, total 7.6 6.3 - 8.2 g/dL    Albumin 4.0 3.5 - 5.0 g/dL    Globulin 3.6 (H) 2.3 - 3.5 g/dL    A-G Ratio 1.1 (L) 1.2 - 3.5          Care plan has been discussed and given to patient: n/a        -------------------------------------------------------------------------------------------------------------------  Please call our office at ([phone] you have any  of the following symptoms:   ?? Fever of 100.5 or greater  ?? Chills  ?? Shortness of breath  ?? Swelling or pain in one leg    After office hours an answering service is available and will contact a provider for emergencies or if  you are experiencing any of the above symptoms.    ? Patient did express an interest in My Chart.  My Chart log in information explained on the after visit summary printout at the [place] desk.    Loel Lofty, RN

## 2016-10-12 NOTE — Other (Signed)
Dr.Houmann, anesthesia, reviewed and ok'd for surgery HGB (10/10/16) and hematology note (10/10/16).  No new orders received.

## 2016-10-12 NOTE — Other (Signed)
Patient verified name, DOB, and surgery as listed in [facility].     Type 1B surgery, phone assessment complete.  Orders on chart- received.    Labs per surgeon: none  Labs per anesthesia protocol: CBC and CMP collected 10/10/16- will have anesthesia review pt's hgb result >18- pt with known polycythemia and is followed by Dr Rose FillersQuddus (heme-onc) currently. Last hematology office note 10/10/16 in EMR for anesthesia reference.    Patient answered medical/surgical history questions at their best of ability. All prior to admission medications documented in [facility].    Patient instructed to take the following medications the day of surgery according to anesthesia guidelines with a small sip of water: asa 81mg .  Hold all vitamins 7 days prior to surgery and NSAIDS 5 days prior to surgery. Medications to be held- valsartan/hctz DOS, metformin DOS, diclofenac x 5 days, multivitamin x 7 days.    Patient instructed on the following:  Arrive at A Entrance, time of arrival to be called the day before by 1700  NPO after midnight including gum, mints, and ice chips  Responsible adult must drive patient to the hospital, stay during surgery, and patient will need supervision 24 hours after anesthesia  Use antibacterial soap in shower the night before surgery and on the morning of surgery  Leave all valuables (money and jewelry) at home but bring insurance card and ID on DOS  Do not wear make-up, nail polish, lotions, cologne, perfumes, powders, or oil on skin.    Patient teach back successful and patient demonstrates knowledge of instruction.

## 2016-10-14 NOTE — Brief Op Note (Deleted)
BRIEF OPERATIVE NOTE    Date of Procedure: 10/19/2016     Preoperative Diagnosis:  ACL TEAR RIGHT KNEE [S83.511A]      MEDIAL MENISCAL TEAR RIGHT KNEE  [S83.241A]      LATERAL MENISCAL TEAR RIGHT KNEE  [S83.281A]      OA RIGHT KNEE [M17.11]    Postoperative Diagnosis:  SAME    Procedure(s): ARTHROSCOPY RIGHT KNEE ARTHROSCOPIC ANTERIOR CRUCIATE LIGAMENT RECONSTRUCTION UTILIZING  ALLOGRAFT BONE PATELLA TENDON BONE GRAFT,  PARTIAL MEDIAL AND LATERAL MENISECTOMIES    Surgeon(s) and Role:     * Orvis BrillAlan G Posta Jr., MD - Primary           Anesthesia: General WITH FEMORAL/SCIATIC NERVE BLOCK     Complications: NONE    Implants: * No implants in log *     TOURNIQUET:  MINUTES    Orvis BrillAlan G Posta Jr., MD

## 2016-10-14 NOTE — H&P (Addendum)
[facility] HISTORY AND PHYSICAL    Subjective:     Patient is a 60 y.o. MALE WITH RIGHT KNEE PAIN.  SEE OFFICE NOTE.    Patient Active Problem List    Diagnosis Date Noted   ??? Complete tear of right ACL 10/14/2016   ??? Tear of medial meniscus of right knee 10/14/2016   ??? Tear of lateral meniscus of right knee 10/14/2016   ??? Osteoarthritis of right knee 10/14/2016   ??? GERD (gastroesophageal reflux disease)    ??? Hypercholesterolemia    ??? Morbid obesity (HCC)    ??? Lumbar stenosis with neurogenic claudication 11/06/2015     Class: Chronic   ??? Spondylolisthesis at L4-L5 level 10/19/2015   ??? OSA (obstructive sleep apnea) 06/30/2015   ??? 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 Mood disorder (HCC) 07/14/2014   ??? Venous insufficiency of both lower extremities 07/14/2014   ??? Generalized anxiety disorder    ??? Testicular hypofunction    ??? Allergic rhinitis, cause unspecified    ??? Pre-diabetes    ??? Essential hypertension, benign      Past Medical History:   Diagnosis Date   ??? Allergic rhinitis, cause unspecified    ??? BMI 40.0-44.9, adult (HCC)    ??? Chronic left sacroiliac joint pain    ??? Chronic radicular low back pain         ??? Diverticulitis    ??? Essential hypertension, benign    ??? Generalized anxiety disorder    ??? GERD (gastroesophageal reflux disease)     occ no meds   ??? Hypercholesterolemia    ??? Left lumbar radiculitis     He has had low back pain that radiates down his LLE for years.   ??? 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 Mood disorder (HCC) 07/14/2014   ??? Morbid obesity (HCC)     bmi = 40   ??? OSA on CPAP     wears cpap at hs   ??? Polycythemia dx--06/2015    followed by Dr Rose FillersQuddus (heme-onc); "Advised to consider stopping testosterone replacement. Serial monitoring of hct: phlebotomy only if hct over 65%" per office note 10/10/16. Hgb 18-19 since 06/2012.   ??? Prediabetes      does not check BG at home, metformin daily, last hgba1c- 6.5 (05/2016)   ??? Spondylolisthesis at L4-L5 level 10/19/2015   ??? Testicular hypofunction    ??? Thrombophlebitis of left leg (HCC) 04/2015    posterior tibial vein and peroneal vein   ??? Venous insufficiency of both lower extremities 07/14/2014   ??? Vitamin D deficiency            Past Surgical History:   Procedure Laterality Date   ??? ENDOSCOPY, COLON, DIAGNOSTIC  2011    Diverticulosis   ??? HX COLONOSCOPY  05/2015    Diverticulosis   ??? HX KNEE ARTHROSCOPY Bilateral 04/2015    Partial medial and lateral meniscectomies by Dr. Benjamine MolaVann   ??? HX ORTHOPAEDIC Left 10/19/2015    L4-5 TLIF, decompression. Dr. Dorene GrebeMina      Prior to Admission medications    Medication Sig Start Date End Date Taking? Authorizing Provider   amoxicillin (AMOXIL) 875 mg tablet Take 875 mg by mouth two (2) times a day.    Historical Provider   enoxaparin (LOVENOX) 40 mg/0.4 mL 0.4 mL by SubCUTAneous route daily for 5 days. 11/15/16 11/20/16  Learta CoddingFahd Quddus, MD  diclofenac EC (VOLTAREN) 50 mg EC tablet Take 1 Tab by mouth two (2) times a day. 08/14/16   Darl Householderhristie B Mina, MD   testosterone cypionate (DEPOTESTOTERONE CYPIONATE) 200 mg/mL injection INJECT 1/2 MILLILITER EVERY 7 DAYS 05/31/16   Allene DillonLindsey Caston Cecil, MD   pravastatin (PRAVACHOL) 40 mg tablet TAKE 1 TABLET BY MOUTH AT BEDTIME 05/31/16   Allene DillonLindsey Caston Cecil, MD   metFORMIN (GLUCOPHAGE) 1,000 mg tablet TAKE ONE TABLET BY MOUTH TWICE A DAY 05/31/16   Allene DillonLindsey Caston Cecil, MD   metoprolol succinate (TOPROL-XL) 100 mg tablet Take 1 Tab by mouth nightly. Indications: hypertension 05/31/16   Allene DillonLindsey Caston Cecil, MD   valsartan-hydroCHLOROthiazide (DIOVAN-HCT) 320-12.5 mg per tablet TAKE 1 TABLET BY MOUTH EVERY DAY 05/31/16   Allene DillonLindsey Caston Cecil, MD   aspirin (ASPIRIN LOW-STRENGTH) 81 mg chewable tablet Take 81 mg by mouth every morning. Indications: continue    Historical Provider   MULTI-VITAMIN HI-PO PO Take 2 Tabs by mouth every morning. Indications:  hold until after surgery    Historical Provider     Allergies   Allergen Reactions   ??? Nsaids (Non-Steroidal Anti-Inflammatory Drug) Other (comments)     Cause pedal edema if taken for a number of days in a row      Social History   Substance Use Topics   ??? Smoking status: Never Smoker   ??? Smokeless tobacco: Never Used   ??? Alcohol use 0.6 oz/week     1 Cans of beer per week      Comment: social      Family History   Problem Relation Age of Onset   ??? Diabetes Mother    ??? Alzheimer Mother    ??? Heart Disease Father      atherosclerosis (heavy smoker)   ??? Diabetes Sister       Review of Systems  A comprehensive review of systems was negative except for that written in the HPI.    Objective:     No data found.    There were no vitals taken for this visit.  General:  Alert, cooperative, no distress, appears stated age.   Head:  Normocephalic, without obvious abnormality, atraumatic.                       Back:   Symmetric, no curvature. ROM normal. No CVA tenderness.   Lungs:   Clear to auscultation bilaterally.   Chest wall:  No tenderness or deformity.   Heart:  Regular rate and rhythm, S1, S2 normal, no murmur, click, rub or gallop.               Extremities: Extremities normal, atraumatic, no cyanosis or edema.   Pulses: 2+ and symmetric all extremities.   Skin: Skin color, texture, turgor normal. No rashes or lesions   Lymph nodes: Cervical, supraclavicular, and axillary nodes normal.   Neurologic: CNII-XII intact. Normal strength, sensation and reflexes throughout.             Assessment:   Principal Problem:    Complete tear of right ACL (10/14/2016)    Active Problems:    Tear of medial meniscus of right knee (10/14/2016)      Tear of lateral meniscus of right knee (10/14/2016)      Osteoarthritis of right knee (10/14/2016)        Plan:     The various methods of treatment have been discussed with the patient and family.   PATIENT HAS  EXHAUSTED NON-OPERATIVE MODALITIES.   After consideration of risks, benefits and other options for treatment, the patient has consented to surgical intervention.    SEE OFFICE NOTE.    Orvis Brill., MD

## 2016-10-16 ENCOUNTER — Inpatient Hospital Stay: Primary: Family Medicine

## 2016-10-19 LAB — MISC. LAB TEST

## 2016-11-07 ENCOUNTER — Ambulatory Visit

## 2016-11-07 ENCOUNTER — Inpatient Hospital Stay: Admit: 2016-11-07 | Payer: PRIVATE HEALTH INSURANCE | Primary: Family Medicine

## 2016-11-07 ENCOUNTER — Ambulatory Visit
Admit: 2016-11-07 | Discharge: 2016-11-07 | Payer: PRIVATE HEALTH INSURANCE | Attending: Neurological Surgery | Primary: Family Medicine

## 2016-11-07 DIAGNOSIS — M4316 Spondylolisthesis, lumbar region: Secondary | ICD-10-CM

## 2016-11-07 DIAGNOSIS — M48061 Spinal stenosis, lumbar region without neurogenic claudication: Secondary | ICD-10-CM

## 2016-11-07 NOTE — Progress Notes (Signed)
History of Present Illness    The patient is a 61 y.o. male who returns about 1 yr S/P an L4-5 minimally invasive TLIF.  He continues to do very well with minimal symptoms.      Visit Vitals   ??? BP 130/80   ??? Temp 98 ??F (36.7 ??C)   ??? Ht 5\' 11"  (1.803 m)   ??? Wt 283 lb (128.4 kg)   ??? BMI 39.47 kg/m2         Physical Exam  The physical exam findings are as follows:  Cranial Nerves:   Intact visual fields. Fundi are benign. PERLA, EOM's full, no nystagmus, no ptosis. Facial sensation is normal. Corneal reflexes are intact. Facial movement is symmetric. Hearing is normal bilaterally. Palate is midline with normal sternocleidomastoid and trapezius muscles are normal. Tongue is midline.   Motor:  5/5 strength in upper and lower proximal and distal muscles. Normal bulk and tone. No fasciculations.   Reflexes:   Deep tendon reflexes 2+/4 and symmetrical. No pathologic reflexes present.   Sensory:   Normal to touch, pinprick and vibration.   Gait:  Normal gait.   Tremor:   No tremor noted.   Cerebellar:  No cerebellar signs present.  GCS15, A&Ox3.     Assessment & Plan  The new L-spine films reveal a good solid bony fusion with the hardware in good position.    He is now completely healed.  I have given him a script for fitted compression stockings to help with some mild BLE edema.  He may return to see me on a prn basis.      ICD-10-CM ICD-9-CM    1. Spondylolisthesis at L4-L5 level M43.16 756.12 AMB SUPPLY ORDER       I have spent more that 50% of this 30 minute visit discussing extensively with the patient various treatment options, alternative treatments, diagnostic studies and explained the findings of x-rays, MRI's, CT scans and other data such as EMG's prior notes, operative reports, etc.    Follow-up Disposition:  Return if symptoms worsen or fail to improve.

## 2016-11-08 ENCOUNTER — Ambulatory Visit

## 2016-11-08 ENCOUNTER — Inpatient Hospital Stay: Admit: 2016-11-08 | Payer: PRIVATE HEALTH INSURANCE | Attending: Hematology & Oncology | Primary: Family Medicine

## 2016-11-08 DIAGNOSIS — D751 Secondary polycythemia: Secondary | ICD-10-CM

## 2016-11-08 MED ORDER — SODIUM CHLORIDE 0.9% BOLUS IV
0.9 % | Freq: Once | INTRAVENOUS | Status: AC
Start: 2016-11-08 — End: 2016-11-08
  Administered 2016-11-08: 16:00:00 via INTRAVENOUS

## 2016-11-08 MED ORDER — SALINE PERIPHERAL FLUSH PRN
Freq: Once | INTRAMUSCULAR | Status: AC
Start: 2016-11-08 — End: 2016-11-08
  Administered 2016-11-08: 16:00:00

## 2016-11-08 MED ORDER — DIATRIZOATE MEGLUMINE & SODIUM 66 %-10 % ORAL SOLN
66-10 % | Freq: Once | ORAL | Status: AC
Start: 2016-11-08 — End: 2016-11-08
  Administered 2016-11-08: 16:00:00 via ORAL

## 2016-11-08 MED ORDER — IOPAMIDOL 76 % IV SOLN
370 mg iodine /mL (76 %) | Freq: Once | INTRAVENOUS | Status: AC
Start: 2016-11-08 — End: 2016-11-08
  Administered 2016-11-08: 16:00:00 via INTRAVENOUS

## 2016-11-13 ENCOUNTER — Encounter

## 2016-11-14 ENCOUNTER — Encounter: Payer: BLUE CROSS/BLUE SHIELD | Attending: Hematology & Oncology | Primary: Family Medicine

## 2016-11-14 ENCOUNTER — Encounter: Primary: Family Medicine

## 2016-11-15 ENCOUNTER — Inpatient Hospital Stay: Admit: 2016-11-15 | Payer: PRIVATE HEALTH INSURANCE | Primary: Family Medicine

## 2016-11-15 ENCOUNTER — Ambulatory Visit
Admit: 2016-11-15 | Discharge: 2016-11-15 | Payer: PRIVATE HEALTH INSURANCE | Attending: Hematology & Oncology | Primary: Family Medicine

## 2016-11-15 DIAGNOSIS — D751 Secondary polycythemia: Secondary | ICD-10-CM

## 2016-11-15 DIAGNOSIS — K769 Liver disease, unspecified: Secondary | ICD-10-CM

## 2016-11-15 LAB — CBC WITH AUTOMATED DIFF
ABS. BASOPHILS: 0.1 10*3/uL (ref 0.0–0.2)
ABS. EOSINOPHILS: 0.1 10*3/uL (ref 0.0–0.8)
ABS. LYMPHOCYTES: 1.8 10*3/uL (ref 0.5–4.6)
ABS. MONOCYTES: 0.6 10*3/uL (ref 0.1–1.3)
ABS. NEUTROPHILS: 3.9 10*3/uL (ref 1.7–8.2)
ABSOLUTE NRBC: 0.01 10*3/uL (ref 0.0–0.2)
BASOPHILS: 1 % (ref 0.0–2.0)
EOSINOPHILS: 1 % (ref 0.5–7.8)
HCT: 55.2 % — ABNORMAL HIGH (ref 41.1–50.3)
HGB: 19.9 g/dL — ABNORMAL HIGH (ref 13.6–17.2)
LYMPHOCYTES: 28 % (ref 13–44)
MCH: 31.5 PG (ref 26.1–32.9)
MCHC: 36.1 g/dL — ABNORMAL HIGH (ref 31.4–35.0)
MCV: 87.5 FL (ref 79.6–97.8)
MONOCYTES: 9 % (ref 4.0–12.0)
MPV: 10.5 FL — ABNORMAL LOW (ref 10.8–14.1)
NEUTROPHILS: 61 % (ref 43–78)
PLATELET: 165 10*3/uL (ref 150–450)
RBC: 6.31 M/uL — ABNORMAL HIGH (ref 4.23–5.67)
RDW: 13.2 % (ref 11.9–14.6)
WBC: 6.5 10*3/uL (ref 4.3–11.1)

## 2016-11-15 LAB — METABOLIC PANEL, COMPREHENSIVE
A-G Ratio: 1.1 — ABNORMAL LOW (ref 1.2–3.5)
ALT (SGPT): 53 U/L (ref 12–65)
AST (SGOT): 31 U/L (ref 15–37)
Albumin: 4 g/dL (ref 3.5–5.0)
Alk. phosphatase: 89 U/L (ref 50–136)
Anion gap: 9 mmol/L (ref 7–16)
BUN: 26 MG/DL — ABNORMAL HIGH (ref 6–23)
Bilirubin, total: 0.4 MG/DL (ref 0.2–1.1)
CO2: 23 mmol/L (ref 21–32)
Calcium: 9.3 MG/DL (ref 8.3–10.4)
Chloride: 104 mmol/L (ref 98–107)
Creatinine: 1.51 MG/DL — ABNORMAL HIGH (ref 0.8–1.5)
GFR est AA: >60 mL/min/{1.73_m2} (ref >60)
GFR est non-AA: 51 mL/min/{1.73_m2} — ABNORMAL LOW (ref >60)
Globulin: 3.6 g/dL — ABNORMAL HIGH (ref 2.3–3.5)
Glucose: 128 mg/dL — ABNORMAL HIGH (ref 65–100)
Potassium: 4.4 mmol/L (ref 3.5–5.1)
Protein, total: 7.6 g/dL (ref 6.3–8.2)
Sodium: 136 mmol/L (ref 136–145)

## 2016-11-15 MED ORDER — ENOXAPARIN 40 MG/0.4 ML SUB-Q SYRINGE
40 mg/0.4 mL | INJECTION | Freq: Every day | SUBCUTANEOUS | 0 refills | Status: DC
Start: 2016-11-15 — End: 2016-11-20

## 2016-11-15 NOTE — Progress Notes (Signed)
Data Source: Patient, [facility] record.    11/15/2016    10:23 AM    Steven [name] [id number]    61 y.o.      Patient Encounter: [facility] Visit    Heme Diagnosis:  polycythemia  Heme History (Copied from prior):   73 male, non-smoker, commercial sign business owner, h/o obesity, b/l knee arthroscopy (6/16), vit D deficiency, GERD, diverticulitis, allergic rhinitis, lower back pain related to herniated disc 's with plans to undergo laminectomy & fusion. Surgery was deferred related to insurance approval however was also found to have polycythemia, and as such referred to [place]. Review of labs reveals Hgb 18-19 range since 9/13. Hct 55-61 range. Other cell lines are normal. More recently has had some increase in his Cr 1.4-1.6. Recently seen by pulmonology, and diagnosed w moderate sleep apnea, and now on CPAP x 1 month, reports being regular w/ it as makes him sleep better. Notes chronic LE swelling x 3 yeas. Notes having had a detailed cardiac evaluation and was unremarkable.   Interval History:  01/18: No new complaints. CT CAP without evidence of malignancy except noted liver lesion: will get MRI w/wo contrast to access. Now off testosterone, last injection 2 weeks ago: advised to stay off to see if improvement in polycythemia. Jak2 exon 12-14, CALR, and MPL -ve. Reports h/o below knee DVT 6/16 (was reportedly on testosterone back then also), reports being treated w/ aspirin back then. Planning LT knee procedure in few day: advise enoxaparin 40 mg daily x 5 days post procedure as prophylaxis.   NCCN Distress Score:  -  REVIEW OF SYSTEMS:  As mentioned above, all other systems were reviewed in full and are negative.    Past Medical History:   Diagnosis Date   ??? Allergic rhinitis, cause unspecified    ??? BMI 40.0-44.9, adult ([place])    ??? Chronic left sacroiliac joint pain    ??? Chronic radicular low back pain         ??? Diverticulitis    ??? Essential hypertension, benign     ??? Generalized anxiety disorder    ??? GERD (gastroesophageal reflux disease)     occ no meds   ??? Hypercholesterolemia    ??? Left lumbar radiculitis     He has had low back pain that radiates down his LLE for years.   ??? 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 Mood disorder ([place]) 07/14/2014   ??? Morbid obesity ([place])     bmi = 40   ??? OSA on CPAP     wears cpap at hs   ??? Polycythemia dx--06/2015    followed by Dr Faith Rogue (heme-onc); "Advised to consider stopping testosterone replacement. Serial monitoring of hct: phlebotomy only if hct over 65%" per office note 10/10/16. Hgb 18-19 since 06/2012.   ??? Prediabetes     does not check BG at home, metformin daily, last hgba1c- 6.5 (05/2016)   ??? Spondylolisthesis at L4-L5 level 10/19/2015   ??? Testicular hypofunction    ??? Thrombophlebitis of left leg (HCC) 04/2015    posterior tibial vein and peroneal vein   ??? Venous insufficiency of both lower extremities 07/14/2014   ??? Vitamin D deficiency             Past Surgical History:   Procedure Laterality Date   ??? ENDOSCOPY, COLON, DIAGNOSTIC  2011    Diverticulosis   ??? HX COLONOSCOPY  05/2015    Diverticulosis   ??? HX  KNEE ARTHROSCOPY Bilateral 04/2015    Partial medial and lateral meniscectomies by Dr. Eliseo Squires   ??? HX ORTHOPAEDIC Left 10/19/2015    L4-5 TLIF, decompression. Dr. Raul Del       Current Outpatient Prescriptions   Medication Sig Dispense Refill   ??? amoxicillin (AMOXIL) 875 mg tablet Take 875 mg by mouth two (2) times a day.     ??? diclofenac EC (VOLTAREN) 50 mg EC tablet Take 1 Tab by mouth two (2) times a day. 180 Tab 2   ??? pravastatin (PRAVACHOL) 40 mg tablet TAKE 1 TABLET BY MOUTH AT BEDTIME 30 Tab 5   ??? metFORMIN (GLUCOPHAGE) 1,000 mg tablet TAKE ONE TABLET BY MOUTH TWICE A DAY 60 Tab 5   ??? metoprolol succinate (TOPROL-XL) 100 mg tablet Take 1 Tab by mouth nightly. Indications: hypertension 30 Tab 5   ??? valsartan-hydroCHLOROthiazide (DIOVAN-HCT) 320-12.5 mg per tablet TAKE 1 TABLET BY MOUTH EVERY DAY 30 Tab 5    ??? aspirin (ASPIRIN LOW-STRENGTH) 81 mg chewable tablet Take 81 mg by mouth every morning. Indications: continue     ??? MULTI-VITAMIN HI-PO PO Take 2 Tabs by mouth every morning. Indications: hold until after surgery     ??? testosterone cypionate (DEPOTESTOTERONE CYPIONATE) 200 mg/mL injection INJECT 1/2 MILLILITER EVERY 7 DAYS 2 mL 5       Social History     Social History   ??? Marital status: MARRIED     Spouse name: N/A   ??? Number of children: N/A   ??? Years of education: N/A     Social History Main Topics   ??? Smoking status: Never Smoker   ??? Smokeless tobacco: Never Used   ??? Alcohol use 0.6 oz/week     1 Cans of beer per week      Comment: social   ??? Drug use: No   ??? Sexual activity: Not Asked     Other Topics Concern   ??? None     Social History Narrative       Family History   Problem Relation Age of Onset   ??? Diabetes Mother    ??? Alzheimer Mother    ??? Heart Disease Father      atherosclerosis (heavy smoker)   ??? Diabetes Sister        Allergies   Allergen Reactions   ??? Nsaids (Non-Steroidal Anti-Inflammatory Drug) Other (comments)     Cause pedal edema if taken for a number of days in a row       PHYSICAL EXAMINATION:  General Appearance: Healthy appearing patient in no acute distress  Vitals reviewed.   Visit Vitals   ??? BP 143/83  Comment: standing   ??? Pulse 87   ??? Temp 97.7 ??F (36.5 ??C) (Oral)   ??? Resp 22   ??? Ht 5' 11"  (1.803 m)   ??? Wt 278 lb (126.1 kg)   ??? SpO2 94%   ??? BMI 38.77 kg/m2     HEENT: No oral or pharyngeal masses, ulceration or thrush noted, no sinus tenderness.  Neck is supple with no thyromegaly or JVD noted.  Lymph Nodes: No lymphadenopathy noted in the occipital, pre and post auricular, cervical, supra and infraclavicular, axillary, epitrochlear, inguinal, and popliteal region.  Breasts: No palpable masses, nipple discharge or skin retraction  Lungs/Thorax: Clear to auscultation, no accessory muscles of respiration being used.  Heart: Regular rate and rhythm, normal S1, S2, no appreciable murmurs,  rubs, gallops  Abdomen: Soft, nontender, bowel sounds present,  no appreciable hepatosplenomegaly, no palpable masses  Extremeties: Good pulses bilaterally, no peripheral edema.  Skin: Normal skin tone with no rash, petechiae, ecchymosis noted.  Musculoskeletal: No pain on palpation over bony prominence, no edema, no evidence of gout, no joint or bony deformity  Neurologic: Grossly intact        LABS/IMAGING:    Lab Results   Component Value Date/Time    WBC 6.5 11/15/2016 03:09 PM    HGB 19.9 11/15/2016 03:09 PM    HCT 55.2 11/15/2016 03:09 PM    PLATELET 165 11/15/2016 03:09 PM    MCV 87.5 11/15/2016 03:09 PM       Lab Results   Component Value Date/Time    Sodium 136 11/15/2016 03:09 PM    Potassium 4.4 11/15/2016 03:09 PM    Chloride 104 11/15/2016 03:09 PM    CO2 23 11/15/2016 03:09 PM    Anion gap 9 11/15/2016 03:09 PM    Glucose 128 11/15/2016 03:09 PM    BUN 26 11/15/2016 03:09 PM    Creatinine 1.51 11/15/2016 03:09 PM    BUN/Creatinine ratio 21 05/31/2016 10:23 AM    GFR est AA >60 11/15/2016 03:09 PM    GFR est non-AA 51 11/15/2016 03:09 PM    Calcium 9.3 11/15/2016 03:09 PM    AST (SGOT) 31 11/15/2016 03:09 PM    Alk. phosphatase 89 11/15/2016 03:09 PM    Protein, total 7.6 11/15/2016 03:09 PM    Albumin 4.0 11/15/2016 03:09 PM    Globulin 3.6 11/15/2016 03:09 PM    A-G Ratio 1.1 11/15/2016 03:09 PM    ALT (SGPT) 53 11/15/2016 03:09 PM       JAK2 testing:        Above results reviewed with patient.    ASSESSMENT:  46 male, non-smoker, commercial sign business owner, h/o obesity, b/l knee arthroscopy (6/16), vit D deficiency, GERD, diverticulitis, allergic rhinitis, lower back pain related to herniated disc 's with plans to undergo laminectomy & fusion. Surgery was deferred related to insurance approval however was also found to have polycythemia, and as such referred to [place]. Review of labs reveals Hgb 18-19 range since 9/13. Hct 55-61 range.  Other cell lines are normal. More recently has had some increase in his Cr 1.4-1.6. Recently seen by pulmonology, and diagnosed w moderate sleep apnea, and now on CPAP x 1 month, reports being regular w/ it as makes him sleep better. Notes chronic LE swelling x 3 yeas. Notes having had a detailed cardiac evaluation and was unremarkable.     Polycythemia is likely secondary related to his OSA and should improve w regular use of CPAP. On once daily baby aspirin (for multiple years). Will r/o primary marrow condition as follows however if secondary polycythemia, then no particular directed therapy will be needed unless Hct over 65%.     09/07/15: Hct stable around 60. Peripheral blood smear w normal morphology. Jak2 mutation analysis -ve. Epo high consistent w secondary polycythemia. Advised to continue CPAP and efforts directed at weight loss: Hct should improve over time. Continue daily aspirin. Hold phlebotomies unless hct increases over 65. Surgery end December. Will get CBC 1 week prior. Otherwise RTC in 3 months.     12/11/14: S/p back surgery w Dr Raul Del approx 5 weeks ago, recovering well w wound completely healed. Reports regular w CPAP. Back on daily baby aspirin. Hct better/improving. Lt leg swelling x 1 week: will get dopplers r/o DVT.     06/17: Hct improved.  Regular w CPAP. On testosterone Q7days: likely contributing to polycythemia, advised to discuss w PCP and stop replacement if possible: pt willing.     12/17: Doing well. Remains on testosterone, per pt as felt will help w bone healing per neurosurgery. Will discuss w/ them next visit (in a months time) as surgery almost 1 year out. Will check Jak2 Exon 12,CALR, MPL, and CT CAP (hydration protocol) given hct remains high inspite of CPAP.     01/18: No new complaints. CT CAP without evidence of malignancy except noted liver lesion: will get MRI w/wo contrast to access. Now off testosterone, last injection 2 weeks ago: advised to stay off to see if  improvement in polycythemia. Jak2 exon 12-14, CALR, and MPL -ve. Reports h/o below knee DVT 6/16 (was reportedly on testosterone back then also), reports being treated w/ aspirin back then. Planning LT knee procedure in few day: advise enoxaparin 40 mg daily x 5 days post procedure as prophylaxis.     PLAN:  - As above. To stay off testosterone replacement. Serial monitoring of hct: phlebotomy only if hct over 65%  - Daily CPAP, f/u w pulmonology as needed  - Continue daily aspirin  - Increased creatinine: referred to nephro in past  - Planning LT knee procedure in few day: advise enoxaparin 40 mg daily x 5 days post procedure as prophylaxis.   - CT CAP without malignancy but liver lesion noted: evaluate better w MRI w and w/o contrast.     RTC in 2 month time w routine labs, MRI liver.     I spent over 20 minutes with the patient out of which more than 12 were spent in face to face counseling.    Letha Cape, MD  [facility]  [facility]  [address] [address]  Office : [phone]  Fax : [phone]

## 2016-11-15 NOTE — Patient Instructions (Addendum)
Patient Instructions From Your Nurse    Reason for Visit:  Follow up    Plan:  Stay on the aspirin.    Stay off of the testosterone.    The CT scan showed a small spot in your liver that is likely benign but we will get an MRI just to make sure.    After your knee surgery take lovenox 40 mg injections daily for 5 days. Start taking this when Dr. Quincy Carnes says that it is okay.    Follow Up:  Follow up with Dr. Faith Rogue in 2 months    Recent Lab Results:  Recent Results (from the past 12 hour(s))   CBC WITH AUTOMATED DIFF    Collection Time: 11/15/16  3:09 PM   Result Value Ref Range    WBC 6.5 4.3 - 11.1 K/uL    RBC 6.31 (H) 4.23 - 5.67 M/uL    HGB 19.9 (H) 13.6 - 17.2 g/dL    HCT 55.2 (H) 41.1 - 50.3 %    MCV 87.5 79.6 - 97.8 FL    MCH 31.5 26.1 - 32.9 PG    MCHC 36.1 (H) 31.4 - 35.0 g/dL    RDW 13.2 11.9 - 14.6 %    PLATELET 165 150 - 450 K/uL    MPV 10.5 (L) 10.8 - 14.1 FL    ABSOLUTE NRBC 0.01 0.0 - 0.2 K/uL    DF AUTOMATED      NEUTROPHILS 61 43 - 78 %    LYMPHOCYTES 28 13 - 44 %    MONOCYTES 9 4.0 - 12.0 %    EOSINOPHILS 1 0.5 - 7.8 %    BASOPHILS 1 0.0 - 2.0 %    ABS. NEUTROPHILS 3.9 1.7 - 8.2 K/UL    ABS. LYMPHOCYTES 1.8 0.5 - 4.6 K/UL    ABS. MONOCYTES 0.6 0.1 - 1.3 K/UL    ABS. EOSINOPHILS 0.1 0.0 - 0.8 K/UL    ABS. BASOPHILS 0.1 0.0 - 0.2 K/UL   METABOLIC PANEL, COMPREHENSIVE    Collection Time: 11/15/16  3:09 PM   Result Value Ref Range    Sodium 136 136 - 145 mmol/L    Potassium 4.4 3.5 - 5.1 mmol/L    Chloride 104 98 - 107 mmol/L    CO2 23 21 - 32 mmol/L    Anion gap 9 7 - 16 mmol/L    Glucose 128 (H) 65 - 100 mg/dL    BUN 26 (H) 6 - 23 MG/DL    Creatinine 1.51 (H) 0.8 - 1.5 MG/DL    GFR est AA >60 >60 ml/min/1.58m    GFR est non-AA 51 (L) >60 ml/min/1.747m   Calcium 9.3 8.3 - 10.4 MG/DL    Bilirubin, total 0.4 0.2 - 1.1 MG/DL    ALT (SGPT) 53 12 - 65 U/L    AST (SGOT) 31 15 - 37 U/L    Alk. phosphatase 89 50 - 136 U/L    Protein, total 7.6 6.3 - 8.2 g/dL    Albumin 4.0 3.5 - 5.0 g/dL     Globulin 3.6 (H) 2.3 - 3.5 g/dL    A-G Ratio 1.1 (L) 1.2 - 3.5         Care plan has been discussed and given to patient: did        -------------------------------------------------------------------------------------------------------------------  Please call our office at [phone] you have any  of the following symptoms:   ?? Fever of 100.5 or greater  ?? Chills  ??  Shortness of breath  ?? Swelling or pain in one leg    After office hours an answering service is available and will contact a provider for emergencies or if you are experiencing any of the above symptoms.    ? Patient did express an interest in My Chart.  My Chart log in information explained on the after visit summary printout at the [place] desk.    Loel Lofty, RN

## 2016-11-17 LAB — TESTOSTERONE, FREE & TOTAL
Free testosterone (Direct): 5 pg/mL — ABNORMAL LOW (ref 7.2–24.0)
Testosterone: 82 ng/dL — ABNORMAL LOW (ref 264–916)

## 2016-11-18 NOTE — Brief Op Note (Addendum)
BRIEF OPERATIVE NOTE    Date of Procedure: 11/23/2016     Preoperative Diagnosis:  ACL TEAR RIGHT KNEE      MEDIAL MENISCAL TEAR RIGHT KNEE      LATERAL MENISCAL TEAR RIGHT KNEE      OA RIGHT KNEE    Postoperative Diagnosis: ACL TEAR RIGHT KNEE      LATERAL MENISCAL TEAR RIGHT KNEE      OA RIGHT KNEE    Procedure(s): ARTHROSCOPY RIGHT KNEE ARTHROSCOPIC ANTERIOR CRUCIATE LIGAMENT RECONSTRUCTION UTILIZING ALLOGRAFT BONE PATELLA TENDON BONE GRAFT, PARTIAL LATERAL MENISECTOMY    Surgeon(s) and Role:     * Orvis BrillAlan G Posta Jr., MD - Primary           Anesthesia: General WITH FEMORAL/SCIATIC NERVE BLOCK    Complications: NONE    Implants:     Implant Name Type Inv. Item Serial No. Manufacturer Lot No. LRB No. Used Action   GRAFT TISS BTB HEMI 10MM BLK -- FRZN - [id number]- [id number]  GRAFT TISS BTB HEMI 10MM BLK -- FRZN [id number] [employer] [id number] Right 1 Implanted   WASHER LIG POST SCREW --  - [id number]  WASHER LIG POST SCREW --  [id number] [employer] [id number] Right 1 Implanted   SCR INTFR BIOABSRB BIORCI 8X25 --  - [id number]   SCR INTFR [place] HaggardBIOABSRB BIORCI 8X25 --  [id number] [facility] [id number] Right 1 Implanted        TOURNIQUET:  5749 MINUTES    Orvis BrillAlan G Posta Jr., MD

## 2016-11-20 ENCOUNTER — Inpatient Hospital Stay: Primary: Family Medicine

## 2016-11-20 MED ORDER — ENOXAPARIN 40 MG/0.4 ML SUB-Q SYRINGE
40 mg/0.4 mL | INJECTION | Freq: Every day | SUBCUTANEOUS | 0 refills | Status: DC
Start: 2016-11-20 — End: 2016-11-20

## 2016-11-20 MED ORDER — ENOXAPARIN 40 MG/0.4 ML SUB-Q SYRINGE
40 mg/0.4 mL | INJECTION | Freq: Every day | SUBCUTANEOUS | 0 refills | Status: AC
Start: 2016-11-20 — End: 2016-11-25

## 2016-11-22 NOTE — Other (Signed)
Patient verified name, DOB, and surgery as listed in [facility].     Anesthesia review abnormal labs.  Hematologist note printed for review also.    Type 1B surgery, Phone assessment complete.  Orders  received.    Labs per surgeon: none.  Labs per anesthesia protocol: potassium-WNL:  noted in EMR -labs done 11-15-16.  Also noted elevated hgb and creatinine: anesthesiologist to review.      Patient answered medical/surgical history questions at their best of ability. All prior to admission medications documented in [facility].    Patient instructed to take the following medications the day of surgery according to anesthesia guidelines with a small sip of water: aspirin 81 mg, omeprazole. .  Hold all vitamins 7 days prior to surgery and NSAIDS 5 days prior to surgery. Medications to be held:  none.  Pt with hx of DVT s/p knee surgery in past: he will take Lovenox for 5 days after surgery as instructed by hematologist.    Patient instructed on the following:  Arrive at A Entrance, time of arrival to be called the day before by 1700  NPO after midnight including gum, mints, and ice chips  Responsible adult must drive patient to the hospital, stay during surgery, and patient will  need supervision 24 hours after anesthesia  Use Dial soap in shower the night before surgery and on the morning of surgery  Leave all valuables (money and jewelry) at home but bring insurance card and ID on       DOS  Do not wear make-up, nail polish, lotions, cologne, perfumes, powders, or oil on skin.    Patient teach back successful and patient demonstrates knowledge of instruction.

## 2016-11-22 NOTE — Other (Signed)
Anesthesia (Dr.Sherbert) informed of pt's history of polycythemia, creat 1.51 on 11/15/16. Also informed that hematologist aware of labs and per note 11/15/16- aware of increase in creat 1.4-1.6.    Per Dr. Winifred OliveSherbert, pt ok to proceed with surgery.

## 2016-11-23 ENCOUNTER — Ambulatory Visit

## 2016-11-23 ENCOUNTER — Ambulatory Visit: Admit: 2016-11-23 | Payer: PRIVATE HEALTH INSURANCE | Primary: Family Medicine

## 2016-11-23 ENCOUNTER — Inpatient Hospital Stay: Payer: PRIVATE HEALTH INSURANCE

## 2016-11-23 LAB — GLUCOSE, POC: Glucose (POC): 128 mg/dL — ABNORMAL HIGH (ref 65–100)

## 2016-11-23 MED ORDER — FENTANYL CITRATE (PF) 50 MCG/ML IJ SOLN
50 mcg/mL | Freq: Once | INTRAMUSCULAR | Status: AC
Start: 2016-11-23 — End: 2016-11-23
  Administered 2016-11-23: 14:00:00 via INTRAVENOUS

## 2016-11-23 MED ORDER — ROCURONIUM 10 MG/ML IV
10 mg/mL | INTRAVENOUS | Status: DC | PRN
Start: 2016-11-23 — End: 2016-11-23
  Administered 2016-11-23: 15:00:00 via INTRAVENOUS

## 2016-11-23 MED ORDER — PROPOFOL 10 MG/ML IV EMUL
10 mg/mL | INTRAVENOUS | Status: DC | PRN
Start: 2016-11-23 — End: 2016-11-23
  Administered 2016-11-23: 15:00:00 via INTRAVENOUS

## 2016-11-23 MED ORDER — OXYCODONE 5 MG TAB
5 mg | Freq: Once | ORAL | Status: DC | PRN
Start: 2016-11-23 — End: 2016-11-23

## 2016-11-23 MED ORDER — HYDROMORPHONE (PF) 2 MG/ML IJ SOLN
2 mg/mL | INTRAMUSCULAR | Status: DC | PRN
Start: 2016-11-23 — End: 2016-11-23

## 2016-11-23 MED ORDER — LACTATED RINGERS IV
INTRAVENOUS | Status: DC
Start: 2016-11-23 — End: 2016-11-23
  Administered 2016-11-23: 13:00:00 via INTRAVENOUS

## 2016-11-23 MED ORDER — LIDOCAINE (PF) 20 MG/ML (2 %) IJ SOLN
20 mg/mL (2 %) | INTRAMUSCULAR | Status: DC | PRN
Start: 2016-11-23 — End: 2016-11-23
  Administered 2016-11-23: 15:00:00 via INTRAVENOUS

## 2016-11-23 MED ORDER — FENTANYL CITRATE (PF) 50 MCG/ML IJ SOLN
50 mcg/mL | INTRAMUSCULAR | Status: AC
Start: 2016-11-23 — End: ?

## 2016-11-23 MED ORDER — SUCCINYLCHOLINE CHLORIDE 20 MG/ML INJECTION
20 mg/mL | INTRAMUSCULAR | Status: DC | PRN
Start: 2016-11-23 — End: 2016-11-23
  Administered 2016-11-23: 15:00:00 via INTRAVENOUS

## 2016-11-23 MED ORDER — FENTANYL CITRATE (PF) 50 MCG/ML IJ SOLN
50 mcg/mL | INTRAMUSCULAR | Status: DC | PRN
Start: 2016-11-23 — End: 2016-11-23
  Administered 2016-11-23 (×2): via INTRAVENOUS

## 2016-11-23 MED ORDER — DEXAMETHASONE SODIUM PHOSPHATE 4 MG/ML IJ SOLN
4 mg/mL | INTRAMUSCULAR | Status: DC | PRN
Start: 2016-11-23 — End: 2016-11-23
  Administered 2016-11-23: 15:00:00 via INTRAVENOUS

## 2016-11-23 MED ORDER — ROPIVACAINE (PF) 5 MG/ML (0.5 %) INJECTION
5 mg/mL (0. %) | INTRAMUSCULAR | Status: DC | PRN
Start: 2016-11-23 — End: 2016-11-23
  Administered 2016-11-23: 14:00:00 via PERINEURAL

## 2016-11-23 MED ORDER — OXYCODONE-ACETAMINOPHEN 10 MG-325 MG TAB
10-325 mg | ORAL | Status: DC | PRN
Start: 2016-11-23 — End: 2016-11-23

## 2016-11-23 MED ORDER — MIDAZOLAM 1 MG/ML IJ SOLN
1 mg/mL | Freq: Once | INTRAMUSCULAR | Status: AC
Start: 2016-11-23 — End: 2016-11-23
  Administered 2016-11-23: 14:00:00 via INTRAVENOUS

## 2016-11-23 MED ORDER — ONDANSETRON (PF) 4 MG/2 ML INJECTION
4 mg/2 mL | INTRAMUSCULAR | Status: DC | PRN
Start: 2016-11-23 — End: 2016-11-23
  Administered 2016-11-23: 15:00:00 via INTRAVENOUS

## 2016-11-23 MED ORDER — SODIUM CHLORIDE 0.9 % IJ SYRG
INTRAMUSCULAR | Status: DC | PRN
Start: 2016-11-23 — End: 2016-11-23

## 2016-11-23 MED ORDER — CEFAZOLIN 3 GRAM/30 ML IN STERILE WATER INTRAVENOUS SYRINGE
3 gram/0 mL | Freq: Once | INTRAVENOUS | Status: AC
Start: 2016-11-23 — End: 2016-11-23
  Administered 2016-11-23: 15:00:00 via INTRAVENOUS

## 2016-11-23 MED FILL — DEXAMETHASONE SODIUM PHOSPHATE 10 MG/ML IJ SOLN: 10 mg/mL | INTRAMUSCULAR | Qty: 10

## 2016-11-23 MED FILL — NAROPIN (PF) 5 MG/ML (0.5 %) INJECTION SOLUTION: 5 mg/mL (0. %) | INTRAMUSCULAR | Qty: 50

## 2016-11-23 MED FILL — MIDAZOLAM 1 MG/ML IJ SOLN: 1 mg/mL | INTRAMUSCULAR | Qty: 2

## 2016-11-23 MED FILL — QUELICIN 20 MG/ML INJECTION SOLUTION: 20 mg/mL | INTRAMUSCULAR | Qty: 160

## 2016-11-23 MED FILL — FENTANYL CITRATE (PF) 50 MCG/ML IJ SOLN: 50 mcg/mL | INTRAMUSCULAR | Qty: 2

## 2016-11-23 MED FILL — XYLOCAINE-MPF 20 MG/ML (2 %) INJECTION SOLUTION: 20 mg/mL (2 %) | INTRAMUSCULAR | Qty: 100

## 2016-11-23 MED FILL — PROPOFOL 10 MG/ML IV EMUL: 10 mg/mL | INTRAVENOUS | Qty: 200

## 2016-11-23 MED FILL — CEFAZOLIN 3 GRAM/30 ML IN STERILE WATER INTRAVENOUS SYRINGE: 3 gram/0 mL | INTRAVENOUS | Qty: 30

## 2016-11-23 MED FILL — ONDANSETRON (PF) 4 MG/2 ML INJECTION: 4 mg/2 mL | INTRAMUSCULAR | Qty: 4

## 2016-11-23 MED FILL — ROCURONIUM 10 MG/ML IV: 10 mg/mL | INTRAVENOUS | Qty: 5

## 2016-11-23 NOTE — Anesthesia Procedure Notes (Signed)
Peripheral Block    Start time: 11/23/2016 9:09 AM  End time: 11/23/2016 9:11 AM  Performed by: Andreas BlowerRAWLEY, Eyvette Cordon W  Authorized by: Andreas BlowerRAWLEY, Sidnie Swalley W       Pre-procedure:   Indications: at surgeon's request and post-op pain management    Preanesthetic Checklist: patient identified, risks and benefits discussed, site marked, timeout performed, anesthesia consent given and patient being monitored    Timeout Time: 09:09          Block Type:   Block Type:  Popliteal  Laterality:  Right  Monitoring:  Standard ASA monitoring, continuous pulse ox, frequent vital sign checks, heart rate, oxygen and responsive to questions  Injection Technique:  Single shot  Procedures: ultrasound guided and nerve stimulator    Patient Position: left lateral decubitus  Prep: chlorhexidine    Location:  Lower thigh  Needle Type:  Stimuplex  Needle Gauge:  21 G  Needle Localization:  Anatomical landmarks, nerve stimulator and ultrasound guidance  Motor Response: minimal motor response >0.4 mA    Medication Injected:  0.5%  ropivacaine  Volume (mL):  30    Assessment:  Number of attempts:  1  Injection Assessment:  Incremental injection every 5 mL, local visualized surrounding nerve on ultrasound, negative aspiration for blood, no intravascular symptoms, no paresthesia and ultrasound image on chart  Patient tolerance:  Patient tolerated the procedure well with no immediate complications

## 2016-11-23 NOTE — Anesthesia Procedure Notes (Signed)
Peripheral Block    Start time: 11/23/2016 9:12 AM  End time: 11/23/2016 9:15 AM  Performed by: Andreas BlowerRAWLEY, Kalven Ganim W  Authorized by: Andreas BlowerRAWLEY, Srihan Brutus W       Pre-procedure:   Timeout Time: 09:12          Block Type:   Block Type:  Adductor canal  Laterality:  Right  Monitoring:  Standard ASA monitoring, continuous pulse ox, frequent vital sign checks, heart rate, oxygen and responsive to questions  Injection Technique:  Single shot  Procedures: ultrasound guided    Patient Position: supine  Prep: chlorhexidine    Location:  Mid thigh  Needle Type:  Stimuplex  Needle Gauge:  21 G  Needle Localization:  Anatomical landmarks and ultrasound guidance  Motor Response: minimal motor response >0.4 mA    Medication Injected:  0.5%  Volume (mL):  20    Assessment:  Number of attempts:  1  Injection Assessment:  Incremental injection every 5 mL, negative aspiration for blood, local visualized surrounding nerve on ultrasound, no intravascular symptoms, no paresthesia and ultrasound image on chart  Patient tolerance:  Patient tolerated the procedure well with no immediate complications

## 2016-11-23 NOTE — Op Note (Signed)
[facility]  OPERATIVE REPORT    Name:[name], [name]  MR#: [id number]  DOB: 12-20-1955  ACCOUNT #: [id number]   DATE OF SERVICE: 11/23/2016    PREOPERATIVE DIAGNOSES:  1.  ACL tear, right knee.  2.  Medial meniscus tear, right knee.  3.  Lateral meniscal tear, right knee.    4.  Osteoarthritis, right knee.    POSTOPERATIVE DIAGNOSES:  1.  ACL tear, right knee.    2.  Lateral meniscal tear, right knee.    3.  Osteoarthritis, right knee.    PROCEDURE PERFORMED:  Arthroscopy of right knee, ACL reconstruction utilizing allograft bone-patellar tendon-bone graft, partial lateral meniscectomy.    OPERATIVE SURGEON:  Steven Gawlan G. Raynald KempPosta, [name]., MD        PATHOLOGY:  1.  ACL tear.  2.  Complex lateral meniscal tear.    3.  Osteoarthritis with grade II through IV change of lateral femoral condyle, grade II-III changes of medial femoral condyle, grade I changes patella, trochlea, medial tibial patella, lateral tibial patella.     CPT CODES:  [id number] and [id number].    ICD-10 CODES:  S83.511, S83.271, M17.11.    TOURNIQUET TIME:  49 minutes.      HARDWARE UTILIZED:  One Med/Surg post and screw, one 8 x 25 mm bioabsorbable interference screw.     ANESTHESIA:  GENERAL WITH FEMORAL/SCIATIC NERVE BLOCK      COMPLICATIONS:  NONE.        INDICATIONS:  The patient is a 61 year old gentleman who injured his right knee.  Preoperative physical exam, radiographs, and an MR demonstrated ACL tear, meniscal tears, and early DJD, right knee.  The patient has exhausted all nonoperative modalities and after preoperative medical clearance from his hematologist due to previous blood clotting, he has now elected for operative reconstruction.    DESCRIPTION OF PROCEDURE:  Following identification of the patient, the patient taken to the operative suite.  Following induction of general anesthesia, a femoral-sciatic nerve block for postoperative pain control and 3 g of IV Ancef, the patient was positioned on the operative table in supine  fashion.  Right knee was then examined under anesthesia.  The patient noted to have 3+ Lachman, 3+ anterior drawer, 1+ pivot through full range of motion.  At this point, right leg was then prepped and draped in sterile fashion.  Right leg was then elevated, exsanguinated with an Esmarch bandage, tourniquet was elevated to 300 mmHg.  The standard 4 cm incision based from the distal patellar tendon down the tibial tubercle for a length of 4 cm was performed.  Skin was incised.  Subcutaneous tissue was then dissected down to the proximal medial tibia.  Soft tissue was elevated in a hockey stick like fashion for later drilling of our tibial tunnel.  The 2 arthroscopic portal sites were then identified.  Scope was introduced into the knee.  Diagnostic arthroscopy was then commenced.  Suprapatellar pouch, medial and lateral gutters were visualized.  There was no evidence of any loose body, foreign body, or pathologic plica.  The undersurface of the patella and trochlear groove were visualized.  There were grade I chondromalacic changes of the patella and trochlea which were left alone.  The scope was advanced to the medial compartment.  The leg was flexed 30 degrees.  Medial meniscus was visualized in entirety as well as through the notch.  There was no significant meniscal pathology.  There were some grade II-III areas on the medial femoral condyle and  grade I chondromalacia of the medial facet, which were left alone.  The scope was then advanced to the notch.  ACL was torn.  PCL was intact.  The scope was advanced to the lateral compartment.  The leg was put in figure-of-four position.  Lateral meniscus was visualized in entirety BOTH ABOVE AND BELOW THE POPLITEUS HIATUS.  There was a complex lateral meniscal tear.  With the use of up-going flat biter and side biter, partial lateral meniscectomy was then performed.  This was contoured with the meniscal shaver.  Meniscus was probed and noted to be stable.  Articular  surfaces were notable for some grade II through IV changes of the lateral femoral condyle, grade I changes LATERAL TIBIAL PLATEAU, which were left alone.  The scope was advanced back into the notch.  ACL stump and soft tissue were debrided using shaver.  Notchplasty was then performed using acromionizing bur.  The ACL tibial drill guide was inserted at the appropriate position on the tibial spine.  The guidewire was then passed and noted to be in excellent position.  The tibial tunnel was reamed with an 11 mm reamer.  The tunnel was debrided of soft tissue utilizing the shaver and acromionized using the bur.  The femoral tunnel was drilled via the anteromedial portal, first with a 7, 8, 9, 10 mm tunnel.  The tunnel WAS POSTERIOR WITH THE  posterior cortex intact.  A Beath pin was then passed out to the lateral femoral cortex.  #5 sutures passed from the femoral tunnel down through the tibial tunnel.  At this point, the allograft bone-patella-bone graft was then contoured to fit a 10 mm tunnel proximally, 11 mm distally.  Graft was passed up the tibial tunnel into the femoral tunnel.  Femoral fixation was achieved using an 8 x 25 mm bioabsorbable interference screw.  THIS ACHIEVED  aperture fixation of our femoral bone plug.  Tibial fixation was achieved using Med/Surg post and screw.  Graft was tensioned at 60 degrees in the posterior drawer.  Med/Surg post and screw was assembled in a standard fashion.  At this point, the knee was then examined.  There was no further evidence of a Lachman or a pivot as the knee was put through range of motion.  Scope was placed back in the knee.  Bone plugs were completely seated within their bony tunnels.  There was no impingement.  At this point, with the procedure complete, the knee was then copiously irrigated.  Arthroscopic equipment was removed from the knee.    The portals were approximated using 2-0 nylon horizontal mattress sutures.  Anterior wound was closed with 0 Vicryl  figure-of-eight sutures and 2-0 nylon horizontal mattress sutures.  A sterile dressing was applied.  Knee immobilizer was applied.  Tourniquet was released.  Tourniquet time was 49 minutes.  The patient was then transferred to recovery in stable condition.      CPT CODES:  [id number], [id number].      ICD-10 CODES:  S83.511, S83.271, M17.11.    TOURNIQUET TIME:  49 minutes.     HARDWARE UTILIZED:  One Med/Surg post and screw, one 8 x 25 mm bioabsorbable interference screw.      Orvis Brill, MD       AGP / KN  D: 11/23/2016 11:32     T: 11/23/2016 13:41  JOB #: [id number]

## 2016-11-23 NOTE — H&P (Signed)
Date of Surgery Update:  Steven DuboisMichael L [name] was seen and examined.  History and physical has been reviewed. The patient has been examined. There have been no significant clinical changes since the completion of the originally dated History and Physical.    Signed By: Orvis BrillAlan G Posta Jr., MD     November 23, 2016 7:13 AM

## 2016-11-23 NOTE — Anesthesia Post-Procedure Evaluation (Signed)
Post-Anesthesia Evaluation and Assessment    Patient: Steven DuboisMichael L [name] MRN: [id number]  SSN: [id number]    Date of Birth: 11-05-1955  Age: 61 y.o.  Sex: male       Cardiovascular Function/Vital Signs  Visit Vitals   ??? BP 136/80   ??? Pulse 79   ??? Temp 36.4 ??C (97.5 ??F)   ??? Resp 16   ??? Ht 5\' 11"  (1.803 m)   ??? Wt 126.1 kg (278 lb 1 oz)   ??? SpO2 95%   ??? BMI 38.78 kg/m2       Patient is status post general anesthesia for Procedure(s):  RIGHT KNEE ARTHROSCOPY WITH ANTERIOR CRUCIATE LIGAMENT RECONSTRUCTION UTILIZING  ALLOGRAFT BONE GRAFT  GEN FEM SCIATIC NERVE BLOCK.    Nausea/Vomiting: None    Postoperative hydration reviewed and adequate.    Pain:  Pain Scale 1: Numeric (0 - 10) (11/23/16 1156)  Pain Intensity 1: 0 (11/23/16 1156)   Managed    Neurological Status:   Neuro (WDL): Within Defined Limits (11/23/16 1156)  Neuro  Neurologic State: Alert (11/23/16 1156)  Orientation Level: Oriented X4 (11/23/16 1156)  Speech: Clear (11/23/16 1156)  LUE Motor Response: Purposeful (11/23/16 1111)  LLE Motor Response: Purposeful (11/23/16 1111)  RUE Motor Response: Purposeful (11/23/16 1111)  RLE Motor Response: Weak (11/23/16 1111)   At baseline    Mental Status and Level of Consciousness: Arousable    Pulmonary Status:   O2 Device: Room air (11/23/16 1208)   Adequate oxygenation and airway patent    Complications related to anesthesia: None    Post-anesthesia assessment completed. No concerns    Signed By: Krystal ClarkJ Stanley Izaih Kataoka, MD     November 23, 2016

## 2016-11-23 NOTE — Anesthesia Procedure Notes (Signed)
Peripheral Block    Start time: 11/23/2016 9:09 AM  End time: 11/23/2016 9:11 AM  Performed by: Brice Potteiger W  Authorized by: Artez Regis W       Pre-procedure:   Indications: at surgeon's request and post-op pain management    Preanesthetic Checklist: patient identified, risks and benefits discussed, site marked, timeout performed, anesthesia consent given and patient being monitored    Timeout Time: 09:09          Block Type:   Block Type:  Popliteal  Laterality:  Right  Monitoring:  Standard ASA monitoring, continuous pulse ox, frequent vital sign checks, heart rate, oxygen and responsive to questions  Injection Technique:  Single shot  Procedures: ultrasound guided and nerve stimulator    Patient Position: left lateral decubitus  Prep: chlorhexidine    Location:  Lower thigh  Needle Type:  Stimuplex  Needle Gauge:  21 G  Needle Localization:  Anatomical landmarks, nerve stimulator and ultrasound guidance  Motor Response: minimal motor response >0.4 mA    Medication Injected:  0.5%  ropivacaine  Volume (mL):  30    Assessment:  Number of attempts:  1  Injection Assessment:  Incremental injection every 5 mL, local visualized surrounding nerve on ultrasound, negative aspiration for blood, no intravascular symptoms, no paresthesia and ultrasound image on chart  Patient tolerance:  Patient tolerated the procedure well with no immediate complications

## 2016-11-23 NOTE — Anesthesia Pre-Procedure Evaluation (Signed)
Anesthetic History   No history of anesthetic complications            Review of Systems / Medical History  Patient summary reviewed and pertinent labs reviewed    Pulmonary        Sleep apnea: CPAP           Neuro/Psych   Within defined limits           Cardiovascular    Hypertension: well controlled              Exercise tolerance: >4 METS     GI/Hepatic/Renal     GERD: well controlled           Endo/Other        Morbid obesity     Other Findings              Physical Exam    Airway  Mallampati: II  TM Distance: > 6 cm  Neck ROM: normal range of motion   Mouth opening: Normal     Cardiovascular    Rhythm: regular           Dental  No notable dental hx       Pulmonary                 Abdominal  GI exam deferred       Other Findings            Anesthetic Plan    ASA: 3  Anesthesia type: general - femoral single shot and popliteal fossa block          Induction: Intravenous  Anesthetic plan and risks discussed with: Patient

## 2016-11-23 NOTE — Anesthesia Procedure Notes (Signed)
Peripheral Block    Start time: 11/23/2016 9:12 AM  End time: 11/23/2016 9:15 AM  Performed by: Jasie Meleski W  Authorized by: Shelagh Rayman W       Pre-procedure:   Timeout Time: 09:12          Block Type:   Block Type:  Adductor canal  Laterality:  Right  Monitoring:  Standard ASA monitoring, continuous pulse ox, frequent vital sign checks, heart rate, oxygen and responsive to questions  Injection Technique:  Single shot  Procedures: ultrasound guided    Patient Position: supine  Prep: chlorhexidine    Location:  Mid thigh  Needle Type:  Stimuplex  Needle Gauge:  21 G  Needle Localization:  Anatomical landmarks and ultrasound guidance  Motor Response: minimal motor response >0.4 mA    Medication Injected:  0.5%  Volume (mL):  20    Assessment:  Number of attempts:  1  Injection Assessment:  Incremental injection every 5 mL, negative aspiration for blood, local visualized surrounding nerve on ultrasound, no intravascular symptoms, no paresthesia and ultrasound image on chart  Patient tolerance:  Patient tolerated the procedure well with no immediate complications

## 2016-11-23 NOTE — Op Note (Signed)
[facility]  OPERATIVE REPORT    Name:Steven [name], [name]  MR#: [id number]  DOB: 11/16/1955  ACCOUNT #: [id number]   DATE OF SERVICE: 11/23/2016    PREOPERATIVE DIAGNOSES:  1.  ACL tear, right knee.  2.  Medial meniscus tear, right knee.  3.  Lateral meniscal tear, right knee.    4.  Osteoarthritis, right knee.    POSTOPERATIVE DIAGNOSES:  1.  ACL tear, right knee.    2.  Lateral meniscal tear, right knee.    3.  Osteoarthritis, right knee.    PROCEDURE PERFORMED:  Arthroscopy of right knee, ACL reconstruction utilizing allograft bone-patellar tendon-bone graft, partial lateral meniscectomy.    OPERATIVE SURGEON:  Tandy Gawlan G. Raynald KempPosta, Jr., MD        PATHOLOGY:  1.  ACL tear.  2.  Complex lateral meniscal tear.    3.  Osteoarthritis with grade II through IV change of lateral femoral condyle, grade II-III changes of medial femoral condyle, grade I changes patella, trochlea, medial tibial patella, lateral tibial patella.     CPT CODES:  [id number] and [id number].    ICD-10 CODES:  S83.511, S83.271, M17.11.    TOURNIQUET TIME:  49 minutes.      HARDWARE UTILIZED:  One Med/Surg post and screw, one 8 x 25 mm bioabsorbable interference screw.     ANESTHESIA:  GENERAL WITH FEMORAL/SCIATIC NERVE BLOCK      COMPLICATIONS:  NONE.        INDICATIONS:  The patient is a 61 year old gentleman who injured his right knee.  Preoperative physical exam, radiographs, and an MR demonstrated ACL tear, meniscal tears, and early DJD, right knee.  The patient has exhausted all nonoperative modalities and after preoperative medical clearance from his hematologist due to previous blood clotting, he has now elected for operative reconstruction.    DESCRIPTION OF PROCEDURE:  Following identification of the patient, the patient taken to the operative suite.  Following induction of general anesthesia, a femoral-sciatic nerve block for postoperative pain control and 3 g of IV Ancef, the patient was positioned on the operative table in  supine fashion.  Right knee was then examined under anesthesia.  The patient noted to have 3+ Lachman, 3+ anterior drawer, 1+ pivot through full range of motion.  At this point, right leg was then prepped and draped in sterile fashion.  Right leg was then elevated, exsanguinated with an Esmarch bandage, tourniquet was elevated to 300 mmHg.  The standard 4 cm incision based from the distal patellar tendon down the tibial tubercle for a length of 4 cm was performed.  Skin was incised.  Subcutaneous tissue was then dissected down to the proximal medial tibia.  Soft tissue was elevated in a hockey stick like fashion for later drilling of our tibial tunnel.  The 2 arthroscopic portal sites were then identified.  Scope was introduced into the knee.  Diagnostic arthroscopy was then commenced.  Suprapatellar pouch, medial and lateral gutters were visualized.  There was no evidence of any loose body, foreign body, or pathologic plica.  The undersurface of the patella and trochlear groove were visualized.  There were grade I chondromalacic changes of the patella and trochlea which were left alone.  The scope was advanced to the medial compartment.  The leg was flexed 30 degrees.  Medial meniscus was visualized in entirety as well as through the notch.  There was no significant meniscal pathology.  There were some grade II-III areas on the medial femoral condyle and  grade I chondromalacia of the medial facet, which were left alone.  The scope was then advanced to the notch.  ACL was torn.  PCL was intact.  The scope was advanced to the lateral compartment.  The leg was put in figure-of-four position.  Lateral meniscus was visualized in entirety BOTH ABOVE AND BELOW THE POPLITEUS HIATUS.  There was a complex lateral meniscal tear.  With the use of up-going flat biter and side biter, partial lateral meniscectomy was then performed.  This was contoured with the meniscal shaver.  Meniscus was probed and noted to be  stable.  Articular surfaces were notable for some grade II through IV changes of the lateral femoral condyle, grade I changes LATERAL TIBIAL PLATEAU, which were left alone.  The scope was advanced back into the notch.  ACL stump and soft tissue were debrided using shaver.  Notchplasty was then performed using acromionizing bur.  The ACL tibial drill guide was inserted at the appropriate position on the tibial spine.  The guidewire was then passed and noted to be in excellent position.  The tibial tunnel was reamed with an 11 mm reamer.  The tunnel was debrided of soft tissue utilizing the shaver and acromionized using the bur.  The femoral tunnel was drilled via the anteromedial portal, first with a 7, 8, 9, 10 mm tunnel.  The tunnel WAS POSTERIOR WITH THE  posterior cortex intact.  A Beath pin was then passed out to the lateral femoral cortex.  #5 sutures passed from the femoral tunnel down through the tibial tunnel.  At this point, the allograft bone-patella-bone graft was then contoured to fit a 10 mm tunnel proximally, 11 mm distally.  Graft was passed up the tibial tunnel into the femoral tunnel.  Femoral fixation was achieved using an 8 x 25 mm bioabsorbable interference screw.  THIS ACHIEVED  aperture fixation of our femoral bone plug.  Tibial fixation was achieved using Med/Surg post and screw.  Graft was tensioned at 60 degrees in the posterior drawer.  Med/Surg post and screw was assembled in a standard fashion.  At this point, the knee was then examined.  There was no further evidence of a Lachman or a pivot as the knee was put through range of motion.  Scope was placed back in the knee.  Bone plugs were completely seated within their bony tunnels.  There was no impingement.  At this point, with the procedure complete, the knee was then copiously irrigated.  Arthroscopic equipment was removed from the knee.    The portals were approximated using 2-0 nylon horizontal mattress sutures.   Anterior wound was closed with 0 Vicryl figure-of-eight sutures and 2-0 nylon horizontal mattress sutures.  A sterile dressing was applied.  Knee immobilizer was applied.  Tourniquet was released.  Tourniquet time was 49 minutes.  The patient was then transferred to recovery in stable condition.      CPT CODES:  [id number], [id number].      ICD-10 CODES:  S83.511, S83.271, M17.11.    TOURNIQUET TIME:  49 minutes.     HARDWARE UTILIZED:  One Med/Surg post and screw, one 8 x 25 mm bioabsorbable interference screw.      Orvis Brill, MD       AGP / KN  D: 11/23/2016 11:32     T: 11/23/2016 13:41  JOB #: [id number]

## 2016-11-26 NOTE — Progress Notes (Signed)
First attempt to reach patient to offer [employer] Benefits CM.  Left a discreet vm message with my contact information.  Will attempt outreach call again.

## 2016-11-26 NOTE — Addendum Note (Signed)
Addendum  created 11/26/16 0933 by Earlie CountsBrandon Z Ghada Abbett, CRNA    Anesthesia Intra Flowsheets edited

## 2016-11-27 ENCOUNTER — Encounter: Payer: BLUE CROSS/BLUE SHIELD | Attending: Family Medicine | Primary: Family Medicine

## 2016-11-28 ENCOUNTER — Inpatient Hospital Stay: Admit: 2016-11-28 | Payer: PRIVATE HEALTH INSURANCE | Primary: Family Medicine

## 2016-11-28 DIAGNOSIS — M25561 Pain in right knee: Secondary | ICD-10-CM

## 2016-11-28 NOTE — Progress Notes (Signed)
Steven DuboisMichael L [name]  DOB: 1956-10-10 [facility]  [address], [address], [place] [id number]  Phone:[phone]   Fax:[phone]          OUTPATIENT ORTHOPAEDIC PHYSICAL THERAPY    NAME/AGE/GENDER: Steven [name] is a 61 y.o. male    DATE: 11/28/2016                       Ambulatory/Rehab Services H2 Model Falls Risk Assessment    Risk Factor Pts. ??   Confusion/Disorientation/Impulsivity      4 ??   Symptomatic Depression     2 ??   Altered Elimination     1 ??   Dizziness/Vertigo     1 ??   Gender (Male)     1 ??   Any administered antiepileptics (anticonvulsants):     2 ??   Any administered benzodiazepines:     1 ??   Visual Impairment (specify):     1 ??   Portable Oxygen Use     1 ??   Orthostatic ? BP     1 ??   History of Recent Falls (within 3 mos.)     5     Ability to Rise from Chair (choose one) Pts. ??   Ability to rise in a single movement     0 ??   Pushes up, successful in one attempt     1 ??   Multiple attempts, but unsuccessful     3 ??   Unable to rise without assistance     4   Total: (5 or greater = High Risk) 1     Falls Prevention Plan:                   Physical Limitations to Exercise (specify):                   Mobility Assistance Device (type):                   Exercise/Equipment Adaptation (specify):    ??2010 [employer] All Rights Reserved. [employer] Patent [phone]#7,282,031. Federal Law prohibits the replication, distribution or use without written permission from [employer]     Allene DillonJennifer N Cyriah Childrey, [place]

## 2016-11-28 NOTE — Other (Signed)
Steven [name]  DOB: [birth year]  Primary: Steven [name] [employer]*  Secondary:  [facility]  [address], [address], [place] [address]  [phone]   Fax:[phone]          OUTPATIENT PHYSICAL THERAPY:Initial Assessment 11/28/2016    ICD-10: Treatment Diagnosis:   ?? Pain in right knee (M25.561)  ?? Stiffness of right knee, not elsewhere classified (M25.661)  Precautions/Allergies:   Nsaids (non-steroidal anti-inflammatory drug)   Fall Risk Score: 1 (? 5 = High Risk)  MD Orders: Evaluate and Treat MEDICAL/REFERRING DIAGNOSIS:  S/p right knee scope; ACL reconstruction utilizing allograft  DATE OF ONSET: Surgery: 11/23/2016  REFERRING PHYSICIAN: Orvis Brill., MD  RETURN PHYSICIAN APPOINTMENT: 12/05/2016     INITIAL ASSESSMENT:  Steven [name] presents with decreased mobility, decreased strength, and pain in right knee secondary to ACL repair.  After discussing with patient, he agreed he would benefit from physical therapy to improve above deficits.  Please sign this plan of treatment if you concur.  Thank you for the opportunity to serve this patient.      PROBLEM LIST (Impacting functional limitations):  1. Decreased Strength  2. Decreased ADL/Functional Activities  3. Decreased Ambulation Ability/Technique  4. Decreased Balance  5. Increased Pain  6. Decreased Activity Tolerance INTERVENTIONS PLANNED:  1. Balance Exercise  2. Cold  3. Gait Training  4. Heat  5. Home Exercise Program (HEP)  6. Manual Therapy  7. Neuromuscular Re-education/Strengthening  8. Range of Motion (ROM)  9. Therapeutic Activites  10. Therapeutic Exercise/Strengthening   TREATMENT PLAN:  Effective Dates: 11/28/2016 TO 02/25/2017.  Frequency/Duration: 2 times a week for 8 weeks  GOALS: (Goals have been discussed and agreed upon with patient.)  Short-Term Functional Goals: Time Frame: 3 weeks  1. Patient will be independent with home exercise program without  exacerbation of symptoms or cueing needed.   2. Patient will be independent with correct sleeping positions and awareness/avoidance of aggravating positions without cueing needed.   Discharge Goals: Time Frame: 12 weeks  1. Patient will be independent with all ADLs with minimal onset of right knee pain and no deficits with daily tasks.  2. Patient will report no fear avoidance with social or recreational activities due to right knee pain.  3. Patient will score less than or equal to 50/80 on Lower Extremity Functional Scale with minimal effect of right knee pain on patient's ability to manage every day life activities.  Rehabilitation Potential For Stated Goals: Good  Regarding Steven [name]'s therapy, I certify that the treatment plan above will be carried out by a therapist or under their direction.  Thank you for this referral,  Steven [name], PT     Referring Physician Signature: Steven Left Hinda Glatter., MD              Date                    The information in this section was collected on 11/28/2016 (except where otherwise noted).  HISTORY:   History of Present Injury/Illness (Reason for Referral):  Patient reports that he had surgery on Friday (12/24/2016).  He reports he has been doing well. He reports his pain is under control.  He reports he is ready to get back to walking and moving without fear of pain.  Past Medical History/Comorbidities:   Steven [name]  has a past medical history of Allergic rhinitis, cause unspecified; BMI 40.0-44.9, adult (  HCC); Chronic left sacroiliac joint pain; Chronic radicular low back pain; Diverticulitis; Essential hypertension, benign; Generalized anxiety disorder; GERD (gastroesophageal reflux disease); Hypercholesterolemia; Left lumbar radiculitis; Lumbar stenosis with neurogenic claudication (11/06/2015); Mood disorder (HCC) (07/14/2014); Morbid obesity (HCC); OSA on CPAP; Polycythemia (dx--06/2015); Prediabetes; Spondylolisthesis at L4-L5 level (10/19/2015); Testicular  hypofunction; Thrombophlebitis of left leg (HCC) (04/2015); Venous insufficiency of both lower extremities (07/14/2014); and Vitamin D deficiency. He also has no past medical history of Adverse effect of anesthesia; Difficult intubation; Malignant hyperthermia due to anesthesia; Nausea & vomiting; or Pseudocholinesterase deficiency.  Steven [name]  has a past surgical history that includes endoscopy, colon, diagnostic (2011); hx colonoscopy (05/2015); hx knee arthroscopy (Bilateral, 04/2015); and hx orthopaedic (Left, 10/19/2015).  Social History/Living Environment:   Home Environment: Private residence  Living Alone: No  Support Systems: Family member(s), Spouse/Significant Other/Partner  Prior Level of Function/Work/Activity:  Independent  Dominant Side:         RIGHT  Personal Factors:          Sex:  male        Age:  61 y.o.  Current Medications:       Current Outpatient Prescriptions:   ???  OMEPRAZOLE PO, Take  by mouth as needed., Disp: , Rfl:   ???  diclofenac EC (VOLTAREN) 50 mg EC tablet, Take 1 Tab by mouth two (2) times a day. (Patient taking differently: Take 50 mg by mouth as needed.), Disp: 180 Tab, Rfl: 2  ???  pravastatin (PRAVACHOL) 40 mg tablet, TAKE 1 TABLET BY MOUTH AT BEDTIME, Disp: 30 Tab, Rfl: 5  ???  metFORMIN (GLUCOPHAGE) 1,000 mg tablet, TAKE ONE TABLET BY MOUTH TWICE A DAY, Disp: 60 Tab, Rfl: 5  ???  metoprolol succinate (TOPROL-XL) 100 mg tablet, Take 1 Tab by mouth nightly. Indications: hypertension, Disp: 30 Tab, Rfl: 5  ???  valsartan-hydroCHLOROthiazide (DIOVAN-HCT) 320-12.5 mg per tablet, TAKE 1 TABLET BY MOUTH EVERY DAY, Disp: 30 Tab, Rfl: 5  ???  aspirin (ASPIRIN LOW-STRENGTH) 81 mg chewable tablet, Take 81 mg by mouth every morning. Indications: continue, Disp: , Rfl:   ???  MULTI-VITAMIN HI-PO PO, Take 2 Tabs by mouth every morning. Indications: hold until after surgery, Disp: , Rfl:    Date Last Reviewed:  11/28/2016    Number of Personal Factors/Comorbidities that affect the Plan of Care:  1-2: MODERATE COMPLEXITY   EXAMINATION:   Observation/Orthostatic Postural Assessment:          Posture Assessment: Rounded shoulders, Forward head     Palpation:          Swelling noted in calf region with mild bruising noted behind right knee.  Scars and stitches are intact.      ROM:          R LE Assessment (AROM):   ?? Hip Flexion: 80 degrees    ?? Hip Extension: 0 degrees    ?? Hip Abduction: 10 degrees    ?? Hip Adduction: 0 degrees    ?? Knee Flexion: 45 degrees    ?? Knee Extension: 10 degrees     Strength:          R LE Assessment (Strength):   ?? Hip Flexion: 3+/5 with manual muscle testing    ?? Hip Extension: 3+/5 with manual muscle testing    ?? Hip Abduction: 3+/5 with manual muscle testing    ?? Hip Adduction: 3+/5 with manual muscle testing    ?? Knee Flexion: 3/5 with manual muscle testing    ??  Knee Extension: 3/5 with manual muscle testing     Special Tests:          Not tested    Neurological Screen:        Dermatomes:  Within normal limits        Reflexes:  2+    Functional Mobility:         Gait/Ambulation:     Base of Support: Center of gravity altered  Speed/Cadence: Pace decreased (<100 feet/min)  Step Length: Left shortened, Right lengthened  Swing Pattern: Left asymmetrical, Right symmetrical  Stance: Left decreased, Right increased  Gait Abnormalities: Antalgic  Ambulation - Level of Assistance: Modified independent  Assistive Device: Walker, rolling  ?? Interventions: Verbal cues, Safety awareness training          Transfers:     Sit to Stand: Modified independent  Stand to Sit: Modified independent  Stand Pivot Transfers: Modified independent  Bed to Chair: Modified independent  ?? Lateral Transfers: Modified independent          Bed Mobility:     Rolling: Independent  Supine to Sit: Independent  Sit to Supine: Independent  Scooting: Independent     Balance:          Intact   Body Structures Involved:  1. Joints  2. Muscles Body Functions Affected:  1. Neuromusculoskeletal   2. Movement Related Activities and Participation Affected:  1. Mobility  2. Self Care   Number of elements (examined above) that affect the Plan of Care: 4+: HIGH COMPLEXITY   CLINICAL PRESENTATION:   Presentation: Evolving clinical presentation with changing clinical characteristics: MODERATE COMPLEXITY   CLINICAL DECISION MAKING:   Outcome Measure:   Tool Used: Lower Extremity Functional Scale (LEFS)  Score:  Initial: 17/80 Most Recent: X/80 (Date: -- )   Interpretation of Score: 20 questions each scored on a 5 point scale with 0 representing "extreme difficulty or unable to perform" and 4 representing "no difficulty".  The lower the score, the greater the functional disability. 80/80 represents no disability.  Minimal detectable change is 9 points.  Score 80 79-63 62-48 47-32 31-16 15-1 0   Modifier CH CI CJ CK CL CM CN       Medical Necessity:   ?? Patient is expected to demonstrate progress in strength, range of motion, balance and coordination to increase independence with daily activities.  ?? Patient demonstrates good rehab potential due to higher previous functional level.  ?? Skilled intervention continues to be required due to decreased mobility.  Reason for Services/Other Comments:  ?? Patient continues to demonstrate capacity to improve overall mobility which will increase independence and increase safety.   Use of outcome tool(s) and clinical judgement create a POC that gives a: Questionable prediction of patient's progress: MODERATE COMPLEXITY            TREATMENT:   (In addition to Assessment/Re-Assessment sessions the following treatments were rendered)  Pre-treatment Symptoms/Complaints:  11/28/2016: Patient reports he is feeling good today.   Pain: Initial:   Pain Intensity 1: 5  Pain Location 1: Knee  Pain Orientation 1: Right  Pain Intervention(s) 1: Rest, Medication (see MAR)  Post Session:  4/10     THERAPEUTIC EXERCISE: (10 minutes):  Exercises per grid below to improve  mobility, strength, balance and coordination.  Required minimal verbal and manual cues to promote proper body alignment, promote proper body posture and promote proper body mechanics.  Progressed resistance, range, repetitions and complexity of movement as indicated.  Date:  11/28/2016   Activity/Exercise Parameters   Ankle pumps HEP   Quad sets HEP   Hip flexion/Straight leg raise with quad set HEP   Seated gravity flexion with controlled extension HEP      MedBridge Portal  Treatment/Session Assessment:    ?? Response to Treatment:  Patient tolerated assessment without complaints of increased pain.  Patient verbalized and demonstrated understanding of HEP.  ?? Compliance with Program/Exercises: Will assess as treatment progresses.  ?? Recommendations/Intent for next treatment session: "Next visit will focus on advancements to more challenging activities": UBE/Nu-step, heel slides, SLR all planes, manual patellar mobs  Total Treatment Duration:  PT Patient Time In/Time Out  Time In: 0830  Time Out: 0930    Steven DillonJennifer N Eljay Lave, PT

## 2016-11-30 DIAGNOSIS — M25561 Pain in right knee: Secondary | ICD-10-CM

## 2016-11-30 NOTE — Progress Notes (Signed)
[facility]   [facility] [address]  [address], [address] [place], [place] [address]  Phone: [phone]   Fax: [phone]    OUTPATIENT DAILY NOTE    NAME/AGE/GENDER: Steven [name] is a 61 y.o. male.     DATE: 11/30/2016    Patient called to cancel his appointment for today due to swelling in his surgery leg. He reports that he feels he did too much walking and activity because he was feeling so good.  He reports he called the MDs office to let them know and to make sure he shouldn't be doing anything else.  PT instructed patient to make sure to elevate his legs above the level of his heart to help with swelling, keep icing intermittently during the day to help with swelling and bruising, and to rest to keep from more swelling from happening.  Will plan to follow up on next scheduled visit.    Allene DillonJennifer N Lauralynn Loeb, PT

## 2016-12-01 ENCOUNTER — Inpatient Hospital Stay: Payer: PRIVATE HEALTH INSURANCE | Primary: Family Medicine

## 2016-12-03 ENCOUNTER — Inpatient Hospital Stay: Admit: 2016-12-03 | Payer: PRIVATE HEALTH INSURANCE | Primary: Family Medicine

## 2016-12-03 NOTE — Progress Notes (Signed)
Steven [name]  DOB: [birth year]  Primary: [facility]  Secondary:  [facility]  [address], [place] [address]  Phone:[phone]   Fax:[phone]          OUTPATIENT PHYSICAL THERAPY:Daily Note 12/03/2016    ICD-10: Treatment Diagnosis:   ?? Pain in right knee (M25.561)  ?? Stiffness of right knee, not elsewhere classified (M25.661)  Precautions/Allergies:   Nsaids (non-steroidal anti-inflammatory drug)   Fall Risk Score: 1 (? 5 = High Risk)  MD Orders: Evaluate and Treat MEDICAL/REFERRING DIAGNOSIS:  S/p right knee scope; ACL reconstruction utilizing allograft  DATE OF ONSET: Surgery: 11/23/2016  REFERRING PHYSICIAN: Orvis Brill., MD  RETURN PHYSICIAN APPOINTMENT: 12/05/2016     INITIAL ASSESSMENT:  Steven [name] presents with decreased mobility, decreased strength, and pain in right knee secondary to ACL repair.  After discussing with patient, he agreed he would benefit from physical therapy to improve above deficits.  Please sign this plan of treatment if you concur.  Thank you for the opportunity to serve this patient.      PROBLEM LIST (Impacting functional limitations):  1. Decreased Strength  2. Decreased ADL/Functional Activities  3. Decreased Ambulation Ability/Technique  4. Decreased Balance  5. Increased Pain  6. Decreased Activity Tolerance INTERVENTIONS PLANNED:  1. Balance Exercise  2. Cold  3. Gait Training  4. Heat  5. Home Exercise Program (HEP)  6. Manual Therapy  7. Neuromuscular Re-education/Strengthening  8. Range of Motion (ROM)  9. Therapeutic Activites  10. Therapeutic Exercise/Strengthening   TREATMENT PLAN:  Effective Dates: 11/28/2016 TO 02/25/2017.  Frequency/Duration: 2 times a week for 8 weeks  GOALS: (Goals have been discussed and agreed upon with patient.)  Short-Term Functional Goals: Time Frame: 3 weeks  1. Patient will be independent with home exercise program without exacerbation of symptoms or cueing needed.    2. Patient will be independent with correct sleeping positions and awareness/avoidance of aggravating positions without cueing needed.   Discharge Goals: Time Frame: 12 weeks  1. Patient will be independent with all ADLs with minimal onset of right knee pain and no deficits with daily tasks.  2. Patient will report no fear avoidance with social or recreational activities due to right knee pain.  3. Patient will score less than or equal to 50/80 on Lower Extremity Functional Scale with minimal effect of right knee pain on patient's ability to manage every day life activities.  Rehabilitation Potential For Stated Goals: Good  Regarding [name]'s therapy, I certify that the treatment plan above will be carried out by a therapist or under their direction.  Thank you for this referral,  Blanchard Mane, PTA     Referring Physician Signature: Melanee Left Hinda Glatter., MD              Date                    The information in this section was collected on 11/28/2016 (except where otherwise noted).  HISTORY:   History of Present Injury/Illness (Reason for Referral):  Patient reports that he had surgery on Friday (12/24/2016).  He reports he has been doing well. He reports his pain is under control.  He reports he is ready to get back to walking and moving without fear of pain.  Past Medical History/Comorbidities:   Steven [name]  has a past medical history of Allergic rhinitis, cause unspecified; BMI 40.0-44.9, adult (  HCC); Chronic left sacroiliac joint pain; Chronic radicular low back pain; Diverticulitis; Essential hypertension, benign; Generalized anxiety disorder; GERD (gastroesophageal reflux disease); Hypercholesterolemia; Left lumbar radiculitis; Lumbar stenosis with neurogenic claudication (11/06/2015); Mood disorder (HCC) (07/14/2014); Morbid obesity (HCC); OSA on CPAP; Polycythemia (dx--06/2015); Prediabetes; Spondylolisthesis at L4-L5 level (10/19/2015); Testicular  hypofunction; Thrombophlebitis of left leg (HCC) (04/2015); Venous insufficiency of both lower extremities (07/14/2014); and Vitamin D deficiency. He also has no past medical history of Adverse effect of anesthesia; Difficult intubation; Malignant hyperthermia due to anesthesia; Nausea & vomiting; or Pseudocholinesterase deficiency.  Steven [name]  has a past surgical history that includes endoscopy, colon, diagnostic (2011); hx colonoscopy (05/2015); hx knee arthroscopy (Bilateral, 04/2015); and hx orthopaedic (Left, 10/19/2015).  Social History/Living Environment:   Home Environment: Private residence  Living Alone: No  Support Systems: Family member(s), Spouse/Significant Other/Partner  Prior Level of Function/Work/Activity:  Independent  Dominant Side:         RIGHT  Personal Factors:          Sex:  male        Age:  61 y.o.  Current Medications:       Current Outpatient Prescriptions:   ???  OMEPRAZOLE PO, Take  by mouth as needed., Disp: , Rfl:   ???  diclofenac EC (VOLTAREN) 50 mg EC tablet, Take 1 Tab by mouth two (2) times a day. (Patient taking differently: Take 50 mg by mouth as needed.), Disp: 180 Tab, Rfl: 2  ???  pravastatin (PRAVACHOL) 40 mg tablet, TAKE 1 TABLET BY MOUTH AT BEDTIME, Disp: 30 Tab, Rfl: 5  ???  metFORMIN (GLUCOPHAGE) 1,000 mg tablet, TAKE ONE TABLET BY MOUTH TWICE A DAY, Disp: 60 Tab, Rfl: 5  ???  metoprolol succinate (TOPROL-XL) 100 mg tablet, Take 1 Tab by mouth nightly. Indications: hypertension, Disp: 30 Tab, Rfl: 5  ???  valsartan-hydroCHLOROthiazide (DIOVAN-HCT) 320-12.5 mg per tablet, TAKE 1 TABLET BY MOUTH EVERY DAY, Disp: 30 Tab, Rfl: 5  ???  aspirin (ASPIRIN LOW-STRENGTH) 81 mg chewable tablet, Take 81 mg by mouth every morning. Indications: continue, Disp: , Rfl:   ???  MULTI-VITAMIN HI-PO PO, Take 2 Tabs by mouth every morning. Indications: hold until after surgery, Disp: , Rfl:    Date Last Reviewed:  12/03/2016    Number of Personal Factors/Comorbidities that affect the Plan of Care:  1-2: MODERATE COMPLEXITY   EXAMINATION:   Observation/Orthostatic Postural Assessment:          Posture Assessment: Rounded shoulders, Forward head     Palpation:          Swelling noted in calf region with mild bruising noted behind right knee.  Scars and stitches are intact.      ROM:          R LE Assessment (AROM):   ?? Hip Flexion: 80 degrees    ?? Hip Extension: 0 degrees    ?? Hip Abduction: 10 degrees    ?? Hip Adduction: 0 degrees    ?? Knee Flexion: 85 degrees    ?? Knee Extension: 10 degrees     Strength:          R LE Assessment (Strength):   ?? Hip Flexion: 3+/5 with manual muscle testing    ?? Hip Extension: 3+/5 with manual muscle testing    ?? Hip Abduction: 3+/5 with manual muscle testing    ?? Hip Adduction: 3+/5 with manual muscle testing    ?? Knee Flexion: 3/5 with manual muscle testing    ??  Knee Extension: 3/5 with manual muscle testing     Special Tests:          Not tested    Neurological Screen:        Dermatomes:  Within normal limits        Reflexes:  2+    Functional Mobility:         Gait/Ambulation:     Base of Support: Center of gravity altered  Speed/Cadence: Pace decreased (<100 feet/min)  Step Length: Left shortened, Right lengthened  Swing Pattern: Left asymmetrical, Right symmetrical  Stance: Left decreased, Right increased  Gait Abnormalities: Antalgic  Ambulation - Level of Assistance: Modified independent  Assistive Device: Walker, rolling  ?? Interventions: Verbal cues, Safety awareness training          Transfers:     Sit to Stand: Modified independent  Stand to Sit: Modified independent  Stand Pivot Transfers: Modified independent  Bed to Chair: Modified independent  ?? Lateral Transfers: Modified independent          Bed Mobility:     Rolling: Independent  Supine to Sit: Independent  Sit to Supine: Independent  Scooting: Independent     Balance:          Intact   Body Structures Involved:  1. Joints  2. Muscles Body Functions Affected:  1. Neuromusculoskeletal   2. Movement Related Activities and Participation Affected:  1. Mobility  2. Self Care   Number of elements (examined above) that affect the Plan of Care: 4+: HIGH COMPLEXITY   CLINICAL PRESENTATION:   Presentation: Evolving clinical presentation with changing clinical characteristics: MODERATE COMPLEXITY   CLINICAL DECISION MAKING:   Outcome Measure:   Tool Used: Lower Extremity Functional Scale (LEFS)  Score:  Initial: 17/80 Most Recent: X/80 (Date: -- )   Interpretation of Score: 20 questions each scored on a 5 point scale with 0 representing "extreme difficulty or unable to perform" and 4 representing "no difficulty".  The lower the score, the greater the functional disability. 80/80 represents no disability.  Minimal detectable change is 9 points.  Score 80 79-63 62-48 47-32 31-16 15-1 0   Modifier CH CI CJ CK CL CM CN       Medical Necessity:   ?? Patient is expected to demonstrate progress in strength, range of motion, balance and coordination to increase independence with daily activities.  ?? Patient demonstrates good rehab potential due to higher previous functional level.  ?? Skilled intervention continues to be required due to decreased mobility.  Reason for Services/Other Comments:  ?? Patient continues to demonstrate capacity to improve overall mobility which will increase independence and increase safety.   Use of outcome tool(s) and clinical judgement create a POC that gives a: Questionable prediction of patient's progress: MODERATE COMPLEXITY            TREATMENT:   (In addition to Assessment/Re-Assessment sessions the following treatments were rendered)  Pre-treatment Symptoms/Complaints:  12/03/2016:  I am doing ok   Pain: Initial:      Post Session:  4/10     THERAPEUTIC EXERCISE: (25 minutes):  Exercises per grid below to improve mobility, strength, balance and coordination.  Required minimal verbal and manual cues to promote proper body alignment, promote proper body posture  and promote proper body mechanics.  Progressed resistance, range, repetitions and complexity of movement as indicated.  MANUAL THERAPY: (15 minutes): Joint mobilization and patellar mobs- working on flexion-90 degrees was utilized and necessary because of the patient's  restricted joint motion and restricted motion of soft tissue.   MODALITIES: (10 minutes):      game ready ice machine-skin clear    Date:  12-03-16   Activity/Exercise Parameters   Nu-step X 8 min 90 degree's   Hip Abduction X 15 reps           Ankle pumps X 20 reps   Quad sets X 15 reps hold 5 sec   Hip flexion/Straight leg raise with quad set X 15 reps   Seated gravity flexion with controlled extension X 15 reps      MedBridge Portal  Treatment/Session Assessment:    ?? Response to Treatment: Patient was able to complete tx, minimal lag on SLR.  Continue plan of care.    ?? Compliance with Program/Exercises: Will assess as treatment progresses.  ?? Recommendations/Intent for next treatment session: "Next visit will focus on advancements to more challenging activities": UBE/Nu-step, heel slides, SLR all planes, manual patellar mobs  Total Treatment Duration:  PT Patient Time In/Time Out  Time In: 0400  Time Out: 0450    Blanchard Mane, PTA

## 2016-12-05 ENCOUNTER — Inpatient Hospital Stay: Admit: 2016-12-05 | Payer: PRIVATE HEALTH INSURANCE | Primary: Family Medicine

## 2016-12-05 NOTE — Progress Notes (Signed)
Barton Dubois  DOB: 1956/04/06  Primary: [facility] [place] [facility]  Secondary:  [facility]  [address], [place] [address]  Phone:[phone]   Fax:[phone]          OUTPATIENT PHYSICAL THERAPY:Daily Note 12/05/2016    ICD-10: Treatment Diagnosis:   ?? Pain in right knee (M25.561)  ?? Stiffness of right knee, not elsewhere classified (M25.661)  Precautions/Allergies:   Nsaids (non-steroidal anti-inflammatory drug)   Fall Risk Score: 1 (? 5 = High Risk)  MD Orders: Evaluate and Treat MEDICAL/REFERRING DIAGNOSIS:  S/p right knee scope; ACL reconstruction utilizing allograft  DATE OF ONSET: Surgery: 11/23/2016  REFERRING PHYSICIAN: Orvis Brill., MD  RETURN PHYSICIAN APPOINTMENT: 12/05/2016     INITIAL ASSESSMENT:  Mr. Defranco presents with decreased mobility, decreased strength, and pain in right knee secondary to ACL repair.  Patient has attended a total of 3 scheduled physical therapy visits including initial evaluation on 11/28/2016.  Treatment has consisted of strengthening, stretching, manual therapy, and modalities to improve overall pain, mobility, and performance with activities of daily living.       PROBLEM LIST (Impacting functional limitations):  1. Decreased Strength  2. Decreased ADL/Functional Activities  3. Decreased Ambulation Ability/Technique  4. Decreased Balance  5. Increased Pain  6. Decreased Activity Tolerance INTERVENTIONS PLANNED:  1. Balance Exercise  2. Cold  3. Gait Training  4. Heat  5. Home Exercise Program (HEP)  6. Manual Therapy  7. Neuromuscular Re-education/Strengthening  8. Range of Motion (ROM)  9. Therapeutic Activites  10. Therapeutic Exercise/Strengthening   TREATMENT PLAN:  Effective Dates: 11/28/2016 TO 02/25/2017.  Frequency/Duration: 2 times a week for 8 weeks  GOALS: (Goals have been discussed and agreed upon with patient.)  Short-Term Functional Goals: Time Frame: 3 weeks   1. Patient will be independent with home exercise program without exacerbation of symptoms or cueing needed.   2. Patient will be independent with correct sleeping positions and awareness/avoidance of aggravating positions without cueing needed.   Discharge Goals: Time Frame: 12 weeks  1. Patient will be independent with all ADLs with minimal onset of right knee pain and no deficits with daily tasks.  2. Patient will report no fear avoidance with social or recreational activities due to right knee pain.  3. Patient will score less than or equal to 50/80 on Lower Extremity Functional Scale with minimal effect of right knee pain on patient's ability to manage every day life activities.  Rehabilitation Potential For Stated Goals: Good            The information in this section was collected on 11/28/2016 (except where otherwise noted).  HISTORY:   History of Present Injury/Illness (Reason for Referral):  Patient reports that he had surgery on Friday (12/24/2016).  He reports he has been doing well. He reports his pain is under control.  He reports he is ready to get back to walking and moving without fear of pain.  Past Medical History/Comorbidities:   Mr. Loudermilk  has a past medical history of Allergic rhinitis, cause unspecified; BMI 40.0-44.9, adult (HCC); Chronic left sacroiliac joint pain; Chronic radicular low back pain; Diverticulitis; Essential hypertension, benign; Generalized anxiety disorder; GERD (gastroesophageal reflux disease); Hypercholesterolemia; Left lumbar radiculitis; Lumbar stenosis with neurogenic claudication (11/06/2015); Mood disorder (HCC) (07/14/2014); Morbid obesity (HCC); OSA on CPAP; Polycythemia (dx--06/2015); Prediabetes; Spondylolisthesis at L4-L5 level (10/19/2015); Testicular hypofunction; Thrombophlebitis of left leg (HCC) (04/2015); Venous insufficiency of both lower extremities (07/14/2014);  and Vitamin D deficiency. He also has no past medical history of Adverse effect of  anesthesia; Difficult intubation; Malignant hyperthermia due to anesthesia; Nausea & vomiting; or Pseudocholinesterase deficiency.  Mr. Meddings  has a past surgical history that includes endoscopy, colon, diagnostic (2011); hx colonoscopy (05/2015); hx knee arthroscopy (Bilateral, 04/2015); and hx orthopaedic (Left, 10/19/2015).  Social History/Living Environment:   Home Environment: Private residence  Living Alone: No  Support Systems: Family member(s), Spouse/Significant Other/Partner  Prior Level of Function/Work/Activity:  Independent  Dominant Side:         RIGHT  Personal Factors:          Sex:  male        Age:  61 y.o.  Current Medications:       Current Outpatient Prescriptions:   ???  OMEPRAZOLE PO, Take  by mouth as needed., Disp: , Rfl:   ???  diclofenac EC (VOLTAREN) 50 mg EC tablet, Take 1 Tab by mouth two (2) times a day. (Patient taking differently: Take 50 mg by mouth as needed.), Disp: 180 Tab, Rfl: 2  ???  pravastatin (PRAVACHOL) 40 mg tablet, TAKE 1 TABLET BY MOUTH AT BEDTIME, Disp: 30 Tab, Rfl: 5  ???  metFORMIN (GLUCOPHAGE) 1,000 mg tablet, TAKE ONE TABLET BY MOUTH TWICE A DAY, Disp: 60 Tab, Rfl: 5  ???  metoprolol succinate (TOPROL-XL) 100 mg tablet, Take 1 Tab by mouth nightly. Indications: hypertension, Disp: 30 Tab, Rfl: 5  ???  valsartan-hydroCHLOROthiazide (DIOVAN-HCT) 320-12.5 mg per tablet, TAKE 1 TABLET BY MOUTH EVERY DAY, Disp: 30 Tab, Rfl: 5  ???  aspirin (ASPIRIN LOW-STRENGTH) 81 mg chewable tablet, Take 81 mg by mouth every morning. Indications: continue, Disp: , Rfl:   ???  MULTI-VITAMIN HI-PO PO, Take 2 Tabs by mouth every morning. Indications: hold until after surgery, Disp: , Rfl:    Date Last Reviewed:  12/05/2016    Number of Personal Factors/Comorbidities that affect the Plan of Care: 1-2: MODERATE COMPLEXITY   EXAMINATION:   Observation/Orthostatic Postural Assessment:          Posture Assessment: Rounded shoulders, Forward head     Palpation:           Swelling noted in calf region with mild bruising noted behind right knee.  Scars and stitches are intact.      ROM:          R LE Assessment (AROM):   ?? Hip Flexion: 80 degrees    ?? Hip Extension: 0 degrees    ?? Hip Abduction: 10 degrees    ?? Hip Adduction: 0 degrees    ?? Knee Flexion: 85 degrees    ?? Knee Extension: 10 degrees     Strength:          R LE Assessment (Strength):   ?? Hip Flexion: 3+/5 with manual muscle testing    ?? Hip Extension: 3+/5 with manual muscle testing    ?? Hip Abduction: 3+/5 with manual muscle testing    ?? Hip Adduction: 3+/5 with manual muscle testing    ?? Knee Flexion: 3/5 with manual muscle testing    ?? Knee Extension: 3/5 with manual muscle testing     Special Tests:          Not tested    Neurological Screen:        Dermatomes:  Within normal limits        Reflexes:  2+    Functional Mobility:  Gait/Ambulation:     Base of Support: Center of gravity altered  Speed/Cadence: Pace decreased (<100 feet/min)  Step Length: Left shortened, Right lengthened  Swing Pattern: Left asymmetrical, Right symmetrical  Stance: Left decreased, Right increased  Gait Abnormalities: Antalgic  Ambulation - Level of Assistance: Modified independent  Assistive Device: Walker, rolling  ?? Interventions: Verbal cues, Safety awareness training          Transfers:     Sit to Stand: Modified independent  Stand to Sit: Modified independent  Stand Pivot Transfers: Modified independent  Bed to Chair: Modified independent  ?? Lateral Transfers: Modified independent          Bed Mobility:     Rolling: Independent  Supine to Sit: Independent  Sit to Supine: Independent  Scooting: Independent     Balance:          Intact   Body Structures Involved:  1. Joints  2. Muscles Body Functions Affected:  1. Neuromusculoskeletal  2. Movement Related Activities and Participation Affected:  1. Mobility  2. Self Care   Number of elements (examined above) that affect the Plan of Care: 4+: HIGH COMPLEXITY    CLINICAL PRESENTATION:   Presentation: Evolving clinical presentation with changing clinical characteristics: MODERATE COMPLEXITY   CLINICAL DECISION MAKING:   Outcome Measure:   Tool Used: Lower Extremity Functional Scale (LEFS)  Score:  Initial: 17/80 Most Recent: X/80 (Date: -- )   Interpretation of Score: 20 questions each scored on a 5 point scale with 0 representing "extreme difficulty or unable to perform" and 4 representing "no difficulty".  The lower the score, the greater the functional disability. 80/80 represents no disability.  Minimal detectable change is 9 points.  Score 80 79-63 62-48 47-32 31-16 15-1 0   Modifier CH CI CJ CK CL CM CN       Medical Necessity:   ?? Patient is expected to demonstrate progress in strength, range of motion, balance and coordination to increase independence with daily activities.  ?? Patient demonstrates good rehab potential due to higher previous functional level.  ?? Skilled intervention continues to be required due to decreased mobility.  Reason for Services/Other Comments:  ?? Patient continues to demonstrate capacity to improve overall mobility which will increase independence and increase safety.   Use of outcome tool(s) and clinical judgement create a POC that gives a: Questionable prediction of patient's progress: MODERATE COMPLEXITY            TREATMENT:   (In addition to Assessment/Re-Assessment sessions the following treatments were rendered)  Pre-treatment Symptoms/Complaints:  12/05/2016:  Patient reports he is feeling ok considering the MD just drained fluid and blood off his knee.  Pain: Initial:   Pain Intensity 1: 5  Pain Location 1: Knee  Pain Orientation 1: Right  Pain Intervention(s) 1: Rest, Medication (see MAR)  Post Session:  4/10     THERAPEUTIC EXERCISE: (25 minutes):  Exercises per grid below to improve mobility, strength, balance and coordination.  Required minimal verbal and manual cues to promote proper body alignment, promote proper body posture  and promote proper body mechanics.  Progressed resistance, range, repetitions and complexity of movement as indicated.   Date:  12/05/2016   Activity/Exercise Parameters   Hip flexion/Straight leg raise with quad set 15 reps   Side-lying hip abduction 15 reps   Prone hip extension 15 reps   Ankle pumps 20 reps   Heel slides 15 reps   Seated gravity flexion with controlled extension  15 reps      MANUAL THERAPY: (15 minutes): Joint mobilization and patellar mobilixation was utilized and necessary because of the patient's restricted joint motion, painful spasm, loss of articular motion and restricted motion of soft tissue.   MODALITIES: (10 minutes):      *  Cold Pack Therapy in order to provide analgesia, relieve muscle spasm and reduce inflammation and edema.  (Game Ready - Kinesiotape for bruising)    MedBridge Portal    Treatment/Session Assessment:    ?? Response to Treatment: Patient tolerated treatment with decreased pain after treatment.  Continue plan of care.    ?? Compliance with Program/Exercises: Compliant  ?? Recommendations/Intent for next treatment session: "Next visit will focus on advancements to more challenging activities": UBE/Nu-step, heel slides, SLR all planes, manual patellar mobs  Total Treatment Duration:  PT Patient Time In/Time Out  Time In: 1445  Time Out: 1530    Allene DillonJennifer N Wafa Martes, PT

## 2016-12-07 ENCOUNTER — Encounter: Payer: BLUE CROSS/BLUE SHIELD | Attending: Family Medicine | Primary: Family Medicine

## 2016-12-12 ENCOUNTER — Inpatient Hospital Stay: Admit: 2016-12-12 | Payer: PRIVATE HEALTH INSURANCE | Primary: Family Medicine

## 2016-12-12 NOTE — Progress Notes (Signed)
Steven [name]  DOB: 12-Aug-1956  Primary: [facility]  Secondary:  [facility]  [address], [place] [address]  Phone:[phone]   [phone]          OUTPATIENT PHYSICAL THERAPY:Daily Note 12/12/2016    ICD-10: Treatment Diagnosis:   ?? Pain in right knee (M25.561)  ?? Stiffness of right knee, not elsewhere classified (M25.661)  Precautions/Allergies:   Nsaids (non-steroidal anti-inflammatory drug)   Fall Risk Score: 1 (? 5 = High Risk)  MD Orders: Evaluate and Treat MEDICAL/REFERRING DIAGNOSIS:  S/p right knee scope; ACL reconstruction utilizing allograft  DATE OF ONSET: Surgery: 11/23/2016  REFERRING PHYSICIAN: Orvis [name]., MD  RETURN PHYSICIAN APPOINTMENT: 12/05/2016     INITIAL ASSESSMENT:  Steven [name] presents with decreased mobility, decreased strength, and pain in right knee secondary to ACL repair.  Patient has attended a total of 4 scheduled physical therapy visits including initial evaluation on 11/28/2016.  Treatment has consisted of strengthening, stretching, manual therapy, and modalities to improve overall pain, mobility, and performance with activities of daily living.       PROBLEM LIST (Impacting functional limitations):  1. Decreased Strength  2. Decreased ADL/Functional Activities  3. Decreased Ambulation Ability/Technique  4. Decreased Balance  5. Increased Pain  6. Decreased Activity Tolerance INTERVENTIONS PLANNED:  1. Balance Exercise  2. Cold  3. Gait Training  4. Heat  5. Home Exercise Program (HEP)  6. Manual Therapy  7. Neuromuscular Re-education/Strengthening  8. Range of Motion (ROM)  9. Therapeutic Activites  10. Therapeutic Exercise/Strengthening   TREATMENT PLAN:  Effective Dates: 11/28/2016 TO 02/25/2017.  Frequency/Duration: 2 times a week for 8 weeks  GOALS: (Goals have been discussed and agreed upon with patient.)  Short-Term Functional Goals: Time Frame: 3 weeks   1. Patient will be independent with home exercise program without exacerbation of symptoms or cueing needed.   2. Patient will be independent with correct sleeping positions and awareness/avoidance of aggravating positions without cueing needed.   Discharge Goals: Time Frame: 12 weeks  1. Patient will be independent with all ADLs with minimal onset of right knee pain and no deficits with daily tasks.  2. Patient will report no fear avoidance with social or recreational activities due to right knee pain.  3. Patient will score less than or equal to 50/80 on Lower Extremity Functional Scale with minimal effect of right knee pain on patient's ability to manage every day life activities.  Rehabilitation Potential For Stated Goals: Good            The information in this section was collected on 11/28/2016 (except where otherwise noted).  HISTORY:   History of Present Injury/Illness (Reason for Referral):  Patient reports that he had surgery on Friday (12/24/2016).  He reports he has been doing well. He reports his pain is under control.  He reports he is ready to get back to walking and moving without fear of pain.  Past Medical History/Comorbidities:   Steven [name]  has a past medical history of Allergic rhinitis, cause unspecified; BMI 40.0-44.9, adult (HCC); Chronic left sacroiliac joint pain; Chronic radicular low back pain; Diverticulitis; Essential hypertension, benign; Generalized anxiety disorder; GERD (gastroesophageal reflux disease); Hypercholesterolemia; Left lumbar radiculitis; Lumbar stenosis with neurogenic claudication (11/06/2015); Mood disorder (HCC) (07/14/2014); Morbid obesity (HCC); OSA on CPAP; Polycythemia (dx--06/2015); Prediabetes; Spondylolisthesis at L4-L5 level (10/19/2015); Testicular hypofunction; Thrombophlebitis of left leg (HCC) (04/2015); Venous insufficiency of both lower extremities (07/14/2014);  and Vitamin D deficiency. He also has no past medical history of Adverse effect of  anesthesia; Difficult intubation; Malignant hyperthermia due to anesthesia; Nausea & vomiting; or Pseudocholinesterase deficiency.  Steven [name]  has a past surgical history that includes endoscopy, colon, diagnostic (2011); hx colonoscopy (05/2015); hx knee arthroscopy (Bilateral, 04/2015); and hx orthopaedic (Left, 10/19/2015).  Social History/Living Environment:   Home Environment: Private residence  Living Alone: No  Support Systems: Family member(s), Spouse/Significant Other/Partner  Prior Level of Function/Work/Activity:  Independent  Dominant Side:         RIGHT  Personal Factors:          Sex:  male        Age:  61 y.o.  Current Medications:       Current Outpatient Prescriptions:   ???  OMEPRAZOLE PO, Take  by mouth as needed., Disp: , Rfl:   ???  diclofenac EC (VOLTAREN) 50 mg EC tablet, Take 1 Tab by mouth two (2) times a day. (Patient taking differently: Take 50 mg by mouth as needed.), Disp: 180 Tab, Rfl: 2  ???  pravastatin (PRAVACHOL) 40 mg tablet, TAKE 1 TABLET BY MOUTH AT BEDTIME, Disp: 30 Tab, Rfl: 5  ???  metFORMIN (GLUCOPHAGE) 1,000 mg tablet, TAKE ONE TABLET BY MOUTH TWICE A DAY, Disp: 60 Tab, Rfl: 5  ???  metoprolol succinate (TOPROL-XL) 100 mg tablet, Take 1 Tab by mouth nightly. Indications: hypertension, Disp: 30 Tab, Rfl: 5  ???  valsartan-hydroCHLOROthiazide (DIOVAN-HCT) 320-12.5 mg per tablet, TAKE 1 TABLET BY MOUTH EVERY DAY, Disp: 30 Tab, Rfl: 5  ???  aspirin (ASPIRIN LOW-STRENGTH) 81 mg chewable tablet, Take 81 mg by mouth every morning. Indications: continue, Disp: , Rfl:   ???  MULTI-VITAMIN HI-PO PO, Take 2 Tabs by mouth every morning. Indications: hold until after surgery, Disp: , Rfl:    Date Last Reviewed:  12/12/2016    Number of Personal Factors/Comorbidities that affect the Plan of Care: 1-2: MODERATE COMPLEXITY   EXAMINATION:   Observation/Orthostatic Postural Assessment:          Posture Assessment: Rounded shoulders, Forward head   Palpation:           Swelling noted in calf region with mild bruising noted behind right knee.  Scars and stitches are intact.    ROM:          R LE Assessment (AROM):   ?? Hip Flexion: 80 degrees    ?? Hip Extension: 0 degrees    ?? Hip Abduction: 10 degrees    ?? Hip Adduction: 0 degrees    ?? Knee Flexion: 85 degrees    ?? Knee Extension: 10 degrees     Strength:          R LE Assessment (Strength):   ?? Hip Flexion: 3+/5 with manual muscle testing    ?? Hip Extension: 3+/5 with manual muscle testing    ?? Hip Abduction: 3+/5 with manual muscle testing    ?? Hip Adduction: 3+/5 with manual muscle testing    ?? Knee Flexion: 3/5 with manual muscle testing    ?? Knee Extension: 3/5 with manual muscle testing     Special Tests:          Not tested    Neurological Screen:        Dermatomes:  Within normal limits        Reflexes:  2+    Functional Mobility:         Gait/Ambulation:  Base of Support: Center of gravity altered  Speed/Cadence: Pace decreased (<100 feet/min)  Step Length: Left shortened, Right lengthened  Swing Pattern: Left asymmetrical, Right symmetrical  Stance: Left decreased, Right increased  Gait Abnormalities: Antalgic  Ambulation - Level of Assistance: Modified independent  Assistive Device: Walker, rolling  ?? Interventions: Verbal cues, Safety awareness training          Transfers:     Sit to Stand: Modified independent  Stand to Sit: Modified independent  Stand Pivot Transfers: Modified independent  Bed to Chair: Modified independent  ?? Lateral Transfers: Modified independent          Bed Mobility:     Rolling: Independent  Supine to Sit: Independent  Sit to Supine: Independent  Scooting: Independent     Balance:          Intact   Body Structures Involved:  1. Joints  2. Muscles Body Functions Affected:  1. Neuromusculoskeletal  2. Movement Related Activities and Participation Affected:  1. Mobility  2. Self Care   Number of elements (examined above) that affect the Plan of Care: 4+: HIGH COMPLEXITY    CLINICAL PRESENTATION:   Presentation: Evolving clinical presentation with changing clinical characteristics: MODERATE COMPLEXITY   CLINICAL DECISION MAKING:   Outcome Measure:   Tool Used: Lower Extremity Functional Scale (LEFS)  Score:  Initial: 17/80 Most Recent: X/80 (Date: -- )   Interpretation of Score: 20 questions each scored on a 5 point scale with 0 representing "extreme difficulty or unable to perform" and 4 representing "no difficulty".  The lower the score, the greater the functional disability. 80/80 represents no disability.  Minimal detectable change is 9 points.  Score 80 79-63 62-48 47-32 31-16 15-1 0   Modifier CH CI CJ CK CL CM CN     Medical Necessity:   ?? Patient is expected to demonstrate progress in strength, range of motion, balance and coordination to increase independence with daily activities.  ?? Patient demonstrates good rehab potential due to higher previous functional level.  ?? Skilled intervention continues to be required due to decreased mobility.  Reason for Services/Other Comments:  ?? Patient continues to demonstrate capacity to improve overall mobility which will increase independence and increase safety.   Use of outcome tool(s) and clinical judgement create a POC that gives a: Questionable prediction of patient's progress: MODERATE COMPLEXITY            TREATMENT:   (In addition to Assessment/Re-Assessment sessions the following treatments were rendered)  Pre-treatment Symptoms/Complaints:  12/12/2016:  Patient reports he has some swelling in his leg today, but he was fairly active yesterday.  Pain: Initial:   Pain Intensity 1: 5  Pain Location 1: Knee  Pain Orientation 1: Right  Pain Intervention(s) 1: Rest, Medication (see MAR)  Post Session:  4/10     THERAPEUTIC EXERCISE: (25 minutes):  Exercises per grid below to improve mobility, strength, balance and coordination.  Required minimal verbal and manual cues to promote proper body alignment, promote proper body posture  and promote proper body mechanics.  Progressed resistance, range, repetitions and complexity of movement as indicated.   Date:  12/12/2016   Activity/Exercise Parameters   Nu-step (limited ROM) 5 minutes  Level 1   Hip flexion/Straight leg raise with quad set 15 reps   Side-lying hip abduction 15 reps   Prone hip extension 15 reps   Ankle pumps 20 reps   Heel slides 15 reps   Seated gravity flexion with  controlled extension 15 reps      MANUAL THERAPY: (15 minutes): Joint mobilization and patellar mobilixation was utilized and necessary because of the patient's restricted joint motion, painful spasm, loss of articular motion and restricted motion of soft tissue.   MODALITIES: (10 minutes):      *  Cold Pack Therapy in order to provide analgesia, relieve muscle spasm and reduce inflammation and edema.  (Game Ready)    MedBridge Portal    Treatment/Session Assessment:    ?? Response to Treatment: Patient tolerated treatment with decreased pain after treatment.  Continue plan of care.    ?? Compliance with Program/Exercises: Compliant  ?? Recommendations/Intent for next treatment session: "Next visit will focus on advancements to more challenging activities": UBE/Nu-step, heel slides, SLR all planes, manual patellar mobs  Total Treatment Duration:  PT Patient Time In/Time Out  Time In: 1345  Time Out: 1430    Allene Dillon, PT

## 2016-12-13 ENCOUNTER — Encounter: Payer: PRIVATE HEALTH INSURANCE | Attending: Family Medicine | Primary: Family Medicine

## 2016-12-14 ENCOUNTER — Inpatient Hospital Stay: Admit: 2016-12-14 | Payer: PRIVATE HEALTH INSURANCE | Primary: Family Medicine

## 2016-12-14 NOTE — Progress Notes (Signed)
Barton DuboisMichael L Dudzinski  DOB: 05-29-1956  Primary: [facility] [place] [facility]  Secondary:  [facility]  [address], [address], [place] [id number]  Phone:[phone]   Fax:[phone]          OUTPATIENT PHYSICAL THERAPY:Daily Note 12/14/2016    ICD-10: Treatment Diagnosis:   ?? Pain in right knee (M25.561)  ?? Stiffness of right knee, not elsewhere classified (M25.661)  Precautions/Allergies:   Nsaids (non-steroidal anti-inflammatory drug)   Fall Risk Score: 1 (? 5 = High Risk)  MD Orders: Evaluate and Treat MEDICAL/REFERRING DIAGNOSIS:  S/p right knee scope; ACL reconstruction utilizing allograft  DATE OF ONSET: Surgery: 11/23/2016  REFERRING PHYSICIAN: Orvis BrillPosta, Alan G Jr., MD  RETURN PHYSICIAN APPOINTMENT: 12/05/2016     ASSESSMENT:  [name] presents with decreased mobility, decreased strength, and pain in right knee secondary to ACL repair.  Patient has attended a total of 5 scheduled physical therapy visits including initial evaluation on 11/28/2016.  Treatment has consisted of strengthening, stretching, manual therapy, and modalities to improve overall pain, mobility, and performance with activities of daily living.       PROBLEM LIST (Impacting functional limitations):  1. Decreased Strength  2. Decreased ADL/Functional Activities  3. Decreased Ambulation Ability/Technique  4. Decreased Balance  5. Increased Pain  6. Decreased Activity Tolerance INTERVENTIONS PLANNED:  1. Balance Exercise  2. Cold  3. Gait Training  4. Heat  5. Home Exercise Program (HEP)  6. Manual Therapy  7. Neuromuscular Re-education/Strengthening  8. Range of Motion (ROM)  9. Therapeutic Activites  10. Therapeutic Exercise/Strengthening   TREATMENT PLAN:  Effective Dates: 11/28/2016 TO 02/25/2017.  Frequency/Duration: 2 times a week for 8 weeks  GOALS: (Goals have been discussed and agreed upon with patient.)  Short-Term Functional Goals: Time Frame: 3 weeks   1. Patient will be independent with home exercise program without exacerbation of symptoms or cueing needed.   2. Patient will be independent with correct sleeping positions and awareness/avoidance of aggravating positions without cueing needed.   Discharge Goals: Time Frame: 12 weeks  1. Patient will be independent with all ADLs with minimal onset of right knee pain and no deficits with daily tasks.  2. Patient will report no fear avoidance with social or recreational activities due to right knee pain.  3. Patient will score less than or equal to 50/80 on Lower Extremity Functional Scale with minimal effect of right knee pain on patient's ability to manage every day life activities.  Rehabilitation Potential For Stated Goals: Good            The information in this section was collected on 11/28/2016 (except where otherwise noted).  HISTORY:   History of Present Injury/Illness (Reason for Referral):  Patient reports that he had surgery on Friday (12/24/2016).  He reports he has been doing well. He reports his pain is under control.  He reports he is ready to get back to walking and moving without fear of pain.  Past Medical History/Comorbidities:   [name]  has a past medical history of Allergic rhinitis, cause unspecified; BMI 40.0-44.9, adult (HCC); Chronic left sacroiliac joint pain; Chronic radicular low back pain; Diverticulitis; Essential hypertension, benign; Generalized anxiety disorder; GERD (gastroesophageal reflux disease); Hypercholesterolemia; Left lumbar radiculitis; Lumbar stenosis with neurogenic claudication (11/06/2015); Mood disorder (HCC) (07/14/2014); Morbid obesity (HCC); OSA on CPAP; Polycythemia (dx--06/2015); Prediabetes; Spondylolisthesis at L4-L5 level (10/19/2015); Testicular hypofunction; Thrombophlebitis of left leg (HCC) (04/2015); Venous insufficiency of both lower extremities (07/14/2014); and  Vitamin D deficiency. He also has no past medical history of Adverse effect of  anesthesia; Difficult intubation; Malignant hyperthermia due to anesthesia; Nausea & vomiting; or Pseudocholinesterase deficiency.  Mr. Brinkley  has a past surgical history that includes endoscopy, colon, diagnostic (2011); hx colonoscopy (05/2015); hx knee arthroscopy (Bilateral, 04/2015); and hx orthopaedic (Left, 10/19/2015).  Social History/Living Environment:   Home Environment: Private residence  Living Alone: No  Support Systems: Family member(s), Spouse/Significant Other/Partner  Prior Level of Function/Work/Activity:  Independent  Dominant Side:         RIGHT  Personal Factors:          Sex:  male        Age:  61 y.o.  Current Medications:       Current Outpatient Prescriptions:   ???  OMEPRAZOLE PO, Take  by mouth as needed., Disp: , Rfl:   ???  diclofenac EC (VOLTAREN) 50 mg EC tablet, Take 1 Tab by mouth two (2) times a day. (Patient taking differently: Take 50 mg by mouth as needed.), Disp: 180 Tab, Rfl: 2  ???  pravastatin (PRAVACHOL) 40 mg tablet, TAKE 1 TABLET BY MOUTH AT BEDTIME, Disp: 30 Tab, Rfl: 5  ???  metFORMIN (GLUCOPHAGE) 1,000 mg tablet, TAKE ONE TABLET BY MOUTH TWICE A DAY, Disp: 60 Tab, Rfl: 5  ???  metoprolol succinate (TOPROL-XL) 100 mg tablet, Take 1 Tab by mouth nightly. Indications: hypertension, Disp: 30 Tab, Rfl: 5  ???  valsartan-hydroCHLOROthiazide (DIOVAN-HCT) 320-12.5 mg per tablet, TAKE 1 TABLET BY MOUTH EVERY DAY, Disp: 30 Tab, Rfl: 5  ???  aspirin (ASPIRIN LOW-STRENGTH) 81 mg chewable tablet, Take 81 mg by mouth every morning. Indications: continue, Disp: , Rfl:   ???  MULTI-VITAMIN HI-PO PO, Take 2 Tabs by mouth every morning. Indications: hold until after surgery, Disp: , Rfl:    Date Last Reviewed:  12/14/2016    Number of Personal Factors/Comorbidities that affect the Plan of Care: 1-2: MODERATE COMPLEXITY   EXAMINATION:   Observation/Orthostatic Postural Assessment:          Posture Assessment: Rounded shoulders, Forward head   Palpation:           Swelling noted in calf region with mild bruising noted behind right knee.  Scars and stitches are intact.    ROM:          R LE Assessment (AROM):   ?? Hip Flexion: 80 degrees    ?? Hip Extension: 0 degrees    ?? Hip Abduction: 10 degrees    ?? Hip Adduction: 0 degrees    ?? Knee Flexion: 85 degrees    ?? Knee Extension: 10 degrees     Strength:          R LE Assessment (Strength):   ?? Hip Flexion: 3+/5 with manual muscle testing    ?? Hip Extension: 3+/5 with manual muscle testing    ?? Hip Abduction: 3+/5 with manual muscle testing    ?? Hip Adduction: 3+/5 with manual muscle testing    ?? Knee Flexion: 3/5 with manual muscle testing    ?? Knee Extension: 3/5 with manual muscle testing     Special Tests:          Not tested    Neurological Screen:        Dermatomes:  Within normal limits        Reflexes:  2+    Functional Mobility:         Gait/Ambulation:  Base of Support: Center of gravity altered  Speed/Cadence: Pace decreased (<100 feet/min)  Step Length: Left shortened, Right lengthened  Swing Pattern: Left asymmetrical, Right symmetrical  Stance: Left decreased, Right increased  Gait Abnormalities: Antalgic  Ambulation - Level of Assistance: Modified independent  Assistive Device: Walker, rolling  ?? Interventions: Verbal cues, Safety awareness training          Transfers:     Sit to Stand: Modified independent  Stand to Sit: Modified independent  Stand Pivot Transfers: Modified independent  Bed to Chair: Modified independent  ?? Lateral Transfers: Modified independent          Bed Mobility:     Rolling: Independent  Supine to Sit: Independent  Sit to Supine: Independent  Scooting: Independent     Balance:          Intact   Body Structures Involved:  1. Joints  2. Muscles Body Functions Affected:  1. Neuromusculoskeletal  2. Movement Related Activities and Participation Affected:  1. Mobility  2. Self Care   Number of elements (examined above) that affect the Plan of Care: 4+: HIGH COMPLEXITY    CLINICAL PRESENTATION:   Presentation: Evolving clinical presentation with changing clinical characteristics: MODERATE COMPLEXITY   CLINICAL DECISION MAKING:   Outcome Measure:   Tool Used: Lower Extremity Functional Scale (LEFS)  Score:  Initial: 17/80 Most Recent: X/80 (Date: -- )   Interpretation of Score: 20 questions each scored on a 5 point scale with 0 representing "extreme difficulty or unable to perform" and 4 representing "no difficulty".  The lower the score, the greater the functional disability. 80/80 represents no disability.  Minimal detectable change is 9 points.  Score 80 79-63 62-48 47-32 31-16 15-1 0   Modifier CH CI CJ CK CL CM CN     Medical Necessity:   ?? Patient is expected to demonstrate progress in strength, range of motion, balance and coordination to increase independence with daily activities.  ?? Patient demonstrates good rehab potential due to higher previous functional level.  ?? Skilled intervention continues to be required due to decreased mobility.  Reason for Services/Other Comments:  ?? Patient continues to demonstrate capacity to improve overall mobility which will increase independence and increase safety.   Use of outcome tool(s) and clinical judgement create a POC that gives a: Questionable prediction of patient's progress: MODERATE COMPLEXITY            TREATMENT:   (In addition to Assessment/Re-Assessment sessions the following treatments were rendered)  Pre-treatment Symptoms/Complaints:  12/14/2016:  Patient reports he is feeling ok today.  Pain: Initial:   Pain Intensity 1: 5  Pain Location 1: Knee  Pain Orientation 1: Right  Pain Intervention(s) 1: Rest, Medication (see MAR)  Post Session:  4/10     THERAPEUTIC EXERCISE: (30 minutes):  Exercises per grid below to improve mobility, strength, balance and coordination.  Required minimal verbal and manual cues to promote proper body alignment, promote proper body posture  and promote proper body mechanics.  Progressed resistance, range, repetitions and complexity of movement as indicated.   Date:  12/14/2016   Activity/Exercise Parameters   Nu-step (limited ROM) 5 minutes  Level 1   Tiltboard: forward/backward 20 reps   Tiltboard: right/left 20 reps   Mini-squats 15 reps   TKE Green t-band  20 reps   Calf stretch on slant board 3 reps  10 second hold   Standing heel raises 15 reps  B LE   LAQ 2  pounds  15 reps  B LE   Hip flexion/Straight leg raise with quad set 15 reps      MANUAL THERAPY: (0 minutes): Joint mobilization and patellar mobilization was utilized and necessary because of the patient's restricted joint motion, painful spasm, loss of articular motion and restricted motion of soft tissue.   MODALITIES: (10 minutes):      *  Cold Pack Therapy in order to provide analgesia, relieve muscle spasm and reduce inflammation and edema.      Med[facility] Portal    Treatment/Session Assessment:    ?? Response to Treatment: Patient tolerated treatment with decreased pain after treatment.  Continue plan of care.    ?? Compliance with Program/Exercises: Compliant  ?? Recommendations/Intent for next treatment session: "Next visit will focus on advancements to more challenging activities".  Total Treatment Duration:  PT Patient Time In/Time Out  Time In: 1345  Time Out: 1430    Allene Dillon, PT

## 2016-12-18 ENCOUNTER — Encounter

## 2016-12-18 ENCOUNTER — Encounter: Payer: PRIVATE HEALTH INSURANCE | Attending: Family Medicine | Primary: Family Medicine

## 2016-12-18 MED ORDER — VALSARTAN-HYDROCHLOROTHIAZIDE 320 MG-12.5 MG TAB
ORAL_TABLET | ORAL | 0 refills | Status: DC
Start: 2016-12-18 — End: 2016-12-20

## 2016-12-18 NOTE — Telephone Encounter (Signed)
Pt needs temp supply of rx until f/u on Thursday if possible

## 2016-12-19 ENCOUNTER — Inpatient Hospital Stay: Admit: 2016-12-19 | Payer: PRIVATE HEALTH INSURANCE | Primary: Family Medicine

## 2016-12-19 NOTE — Progress Notes (Signed)
Steven [name]  DOB: [birth year]  Primary: [facility]  Secondary:  [facility]  [address], [place] [address]  Phone:[phone]   Fax:[phone]          OUTPATIENT PHYSICAL THERAPY:Daily Note 12/19/2016    ICD-10: Treatment Diagnosis:   ?? Pain in right knee (M25.561)  ?? Stiffness of right knee, not elsewhere classified (M25.661)  Precautions/Allergies:   Nsaids (non-steroidal anti-inflammatory drug)   Fall Risk Score: 1 (? 5 = High Risk)  MD Orders: Evaluate and Treat MEDICAL/REFERRING DIAGNOSIS:  S/p right knee scope; ACL reconstruction utilizing allograft  DATE OF ONSET: Surgery: 11/23/2016  REFERRING PHYSICIAN: Orvis Brill., MD  RETURN PHYSICIAN APPOINTMENT: 12/05/2016     ASSESSMENT:  Steven [name] presents with decreased mobility, decreased strength, and pain in right knee secondary to ACL repair.  Patient has attended a total of 6 scheduled physical therapy visits including initial evaluation on 11/28/2016.  Treatment has consisted of strengthening, stretching, manual therapy, and modalities to improve overall pain, mobility, and performance with activities of daily living.       PROBLEM LIST (Impacting functional limitations):  1. Decreased Strength  2. Decreased ADL/Functional Activities  3. Decreased Ambulation Ability/Technique  4. Decreased Balance  5. Increased Pain  6. Decreased Activity Tolerance INTERVENTIONS PLANNED:  1. Balance Exercise  2. Cold  3. Gait Training  4. Heat  5. Home Exercise Program (HEP)  6. Manual Therapy  7. Neuromuscular Re-education/Strengthening  8. Range of Motion (ROM)  9. Therapeutic Activites  10. Therapeutic Exercise/Strengthening   TREATMENT PLAN:  Effective Dates: 11/28/2016 TO 02/25/2017.  Frequency/Duration: 2 times a week for 8 weeks  GOALS: (Goals have been discussed and agreed upon with patient.)  Short-Term Functional Goals: Time Frame: 3 weeks   1. Patient will be independent with home exercise program without exacerbation of symptoms or cueing needed.   2. Patient will be independent with correct sleeping positions and awareness/avoidance of aggravating positions without cueing needed.   Discharge Goals: Time Frame: 12 weeks  1. Patient will be independent with all ADLs with minimal onset of right knee pain and no deficits with daily tasks.  2. Patient will report no fear avoidance with social or recreational activities due to right knee pain.  3. Patient will score less than or equal to 50/80 on Lower Extremity Functional Scale with minimal effect of right knee pain on patient's ability to manage every day life activities.  Rehabilitation Potential For Stated Goals: Good            The information in this section was collected on 11/28/2016 (except where otherwise noted).  HISTORY:   History of Present Injury/Illness (Reason for Referral):  Patient reports that he had surgery on Friday (12/24/2016).  He reports he has been doing well. He reports his pain is under control.  He reports he is ready to get back to walking and moving without fear of pain.  Past Medical History/Comorbidities:   Steven [name]  has a past medical history of Allergic rhinitis, cause unspecified; BMI 40.0-44.9, adult (HCC); Chronic left sacroiliac joint pain; Chronic radicular low back pain; Diverticulitis; Essential hypertension, benign; Generalized anxiety disorder; GERD (gastroesophageal reflux disease); Hypercholesterolemia; Left lumbar radiculitis; Lumbar stenosis with neurogenic claudication (11/06/2015); Mood disorder (HCC) (07/14/2014); Morbid obesity (HCC); OSA on CPAP; Polycythemia (dx--06/2015); Prediabetes; Spondylolisthesis at L4-L5 level (10/19/2015); Testicular hypofunction; Thrombophlebitis of left leg (HCC) (04/2015); Venous insufficiency of both lower extremities (07/14/2014); and  Vitamin D deficiency. He also has no past medical history of Adverse effect of  anesthesia; Difficult intubation; Malignant hyperthermia due to anesthesia; Nausea & vomiting; or Pseudocholinesterase deficiency.  Steven [name]  has a past surgical history that includes endoscopy, colon, diagnostic (2011); hx colonoscopy (05/2015); hx knee arthroscopy (Bilateral, 04/2015); and hx orthopaedic (Left, 10/19/2015).  Social History/Living Environment:   Home Environment: Private residence  Living Alone: No  Support Systems: Family member(s), Spouse/Significant Other/Partner  Prior Level of Function/Work/Activity:  Independent  Dominant Side:         RIGHT  Personal Factors:          Sex:  male        Age:  61 y.o.  Current Medications:       Current Outpatient Prescriptions:   ???  valsartan-hydroCHLOROthiazide (DIOVAN-HCT) 320-12.5 mg per tablet, TAKE 1 TABLET BY MOUTH EVERY DAY, Disp: 30 Tab, Rfl: 0  ???  OMEPRAZOLE PO, Take  by mouth as needed., Disp: , Rfl:   ???  diclofenac EC (VOLTAREN) 50 mg EC tablet, Take 1 Tab by mouth two (2) times a day. (Patient taking differently: Take 50 mg by mouth as needed.), Disp: 180 Tab, Rfl: 2  ???  pravastatin (PRAVACHOL) 40 mg tablet, TAKE 1 TABLET BY MOUTH AT BEDTIME, Disp: 30 Tab, Rfl: 5  ???  metFORMIN (GLUCOPHAGE) 1,000 mg tablet, TAKE ONE TABLET BY MOUTH TWICE A DAY, Disp: 60 Tab, Rfl: 5  ???  metoprolol succinate (TOPROL-XL) 100 mg tablet, Take 1 Tab by mouth nightly. Indications: hypertension, Disp: 30 Tab, Rfl: 5  ???  aspirin (ASPIRIN LOW-STRENGTH) 81 mg chewable tablet, Take 81 mg by mouth every morning. Indications: continue, Disp: , Rfl:   ???  MULTI-VITAMIN HI-PO PO, Take 2 Tabs by mouth every morning. Indications: hold until after surgery, Disp: , Rfl:    Date Last Reviewed:  12/19/2016    Number of Personal Factors/Comorbidities that affect the Plan of Care: 1-2: MODERATE COMPLEXITY   EXAMINATION:   Observation/Orthostatic Postural Assessment:          Posture Assessment: Rounded shoulders, Forward head   Palpation:           Swelling noted in calf region with mild bruising noted behind right knee.  Scars and stitches are intact.    ROM:          R LE Assessment (AROM):   ?? Hip Flexion: 80 degrees    ?? Hip Extension: 0 degrees    ?? Hip Abduction: 10 degrees    ?? Hip Adduction: 0 degrees    ?? Knee Flexion: 85 degrees    ?? Knee Extension: 10 degrees     Strength:          R LE Assessment (Strength):   ?? Hip Flexion: 3+/5 with manual muscle testing    ?? Hip Extension: 3+/5 with manual muscle testing    ?? Hip Abduction: 3+/5 with manual muscle testing    ?? Hip Adduction: 3+/5 with manual muscle testing    ?? Knee Flexion: 3/5 with manual muscle testing    ?? Knee Extension: 3/5 with manual muscle testing     Special Tests:          Not tested    Neurological Screen:        Dermatomes:  Within normal limits        Reflexes:  2+    Functional Mobility:         Gait/Ambulation:  Base of Support: Center of gravity altered  Speed/Cadence: Pace decreased (<100 feet/min)  Step Length: Left shortened, Right lengthened  Swing Pattern: Left asymmetrical, Right symmetrical  Stance: Left decreased, Right increased  Gait Abnormalities: Antalgic  Ambulation - Level of Assistance: Modified independent  Assistive Device: Walker, rolling  ?? Interventions: Verbal cues, Safety awareness training          Transfers:     Sit to Stand: Modified independent  Stand to Sit: Modified independent  Stand Pivot Transfers: Modified independent  Bed to Chair: Modified independent  ?? Lateral Transfers: Modified independent          Bed Mobility:     Rolling: Independent  Supine to Sit: Independent  Sit to Supine: Independent  Scooting: Independent     Balance:          Intact   Body Structures Involved:  1. Joints  2. Muscles Body Functions Affected:  1. Neuromusculoskeletal  2. Movement Related Activities and Participation Affected:  1. Mobility  2. Self Care   Number of elements (examined above) that affect the Plan of Care: 4+: HIGH COMPLEXITY    CLINICAL PRESENTATION:   Presentation: Evolving clinical presentation with changing clinical characteristics: MODERATE COMPLEXITY   CLINICAL DECISION MAKING:   Outcome Measure:   Tool Used: Lower Extremity Functional Scale (LEFS)  Score:  Initial: 17/80 Most Recent: X/80 (Date: -- )   Interpretation of Score: 20 questions each scored on a 5 point scale with 0 representing "extreme difficulty or unable to perform" and 4 representing "no difficulty".  The lower the score, the greater the functional disability. 80/80 represents no disability.  Minimal detectable change is 9 points.  Score 80 79-63 62-48 47-32 31-16 15-1 0   Modifier CH CI CJ CK CL CM CN     Medical Necessity:   ?? Patient is expected to demonstrate progress in strength, range of motion, balance and coordination to increase independence with daily activities.  ?? Patient demonstrates good rehab potential due to higher previous functional level.  ?? Skilled intervention continues to be required due to decreased mobility.  Reason for Services/Other Comments:  ?? Patient continues to demonstrate capacity to improve overall mobility which will increase independence and increase safety.   Use of outcome tool(s) and clinical judgement create a POC that gives a: Questionable prediction of patient's progress: MODERATE COMPLEXITY            TREATMENT:   (In addition to Assessment/Re-Assessment sessions the following treatments were rendered)  Pre-treatment Symptoms/Complaints:  12/19/2016:  Patient reports he is feeling pretty good today, just a little bit of swelling.  Pain: Initial:   Pain Intensity 1: 5  Pain Location 1: Knee  Pain Orientation 1: Right  Pain Intervention(s) 1: Rest, Medication (see MAR)  Post Session:  4/10     THERAPEUTIC EXERCISE: (25 minutes):  Exercises per grid below to improve mobility, strength, balance and coordination.  Required minimal verbal and manual cues to promote proper body alignment, promote proper body posture  and promote proper body mechanics.  Progressed resistance, range, repetitions and complexity of movement as indicated.   Date:  12/19/2016   Activity/Exercise Parameters   Airdyne 7 minutes  Seat #8   Mini-squats 15 reps   TKE Blue t-band  20 reps   Calf stretch on slant board 3 reps  10 second hold   Standing heel raises 15 reps  B LE   LAQ 2 pounds  15 reps  B LE  Hip flexion/Straight leg raise with quad set 15 reps      MANUAL THERAPY: (10 minutes): Joint mobilization and patellar mobilization was utilized and necessary because of the patient's restricted joint motion, painful spasm, loss of articular motion and restricted motion of soft tissue.   MODALITIES: (10 minutes):      *  Cold Pack Therapy in order to provide analgesia, relieve muscle spasm and reduce inflammation and edema.      [facility] Portal    Treatment/Session Assessment:    ?? Response to Treatment: Patient tolerated treatment with decreased pain after treatment.  Continue plan of care.    ?? Compliance with Program/Exercises: Compliant  ?? Recommendations/Intent for next treatment session: "Next visit will focus on advancements to more challenging activities".  Total Treatment Duration:  PT Patient Time In/Time Out  Time In: 1645  Time Out: 1730    Allene Dillon, PT

## 2016-12-20 ENCOUNTER — Ambulatory Visit
Admit: 2016-12-20 | Discharge: 2016-12-20 | Payer: PRIVATE HEALTH INSURANCE | Attending: Family Medicine | Primary: Family Medicine

## 2016-12-20 DIAGNOSIS — I10 Essential (primary) hypertension: Secondary | ICD-10-CM

## 2016-12-20 MED ORDER — PRAVASTATIN 40 MG TAB
40 mg | ORAL_TABLET | ORAL | 5 refills | Status: DC
Start: 2016-12-20 — End: 2017-05-15

## 2016-12-20 MED ORDER — METFORMIN 1,000 MG TAB
1000 mg | ORAL_TABLET | ORAL | 5 refills | Status: DC
Start: 2016-12-20 — End: 2017-05-15

## 2016-12-20 MED ORDER — VALSARTAN-HYDROCHLOROTHIAZIDE 320 MG-12.5 MG TAB
ORAL_TABLET | ORAL | 5 refills | Status: DC
Start: 2016-12-20 — End: 2017-05-15

## 2016-12-20 MED ORDER — SILDENAFIL 100 MG TAB
100 mg | ORAL_TABLET | ORAL | 5 refills | Status: DC | PRN
Start: 2016-12-20 — End: 2017-05-15

## 2016-12-20 MED ORDER — METOPROLOL SUCCINATE SR 100 MG 24 HR TAB
100 mg | ORAL_TABLET | Freq: Every evening | ORAL | 5 refills | Status: DC
Start: 2016-12-20 — End: 2017-05-15

## 2016-12-20 NOTE — Progress Notes (Signed)
HISTORY OF PRESENT ILLNESS  Steven [name] is a 61 y.o. male.  HPI  Chronic f/u - HLP, Prediabetes, low testosterone, HTN:   Doing well on injections and current meds, no se's.     Left ankle swelling - worse by end of day, been a couple of years. Staying same. Not painful, but hard to put shoes on. Better w/ elevation or after night of sleep. Had a blood clot in leg last year left leg after surgery he says.   nsaid works occasionally for pain but upsets stomach  Trazodone didn't help so stopped it, prefers non med. Uses melatonin or benadryl    UPDATE 12/20/16:  Lost about 15 lbs with diet changes. Had his right knee surgery done - just got cleared to drive.  Oncology following [facility] for polycythemia, goes back next month.  Last time testosterone level was 100 - he is concerned about how low it is and has questions about whether there are any other treatment options? Has used mail order viagra in past on his own, never tried rx version.    Congestion - ears plug up at times - almost every day. Been since November-December. Last 2 wks been worse - coughs up some darker color phlegm to it. Been on abx x 2 since then.   Just started flonase yesterday. Not taking daily antihistamine. He had an episode of dizziness/vertigo the other day.        Review of Systems   Constitutional: Negative for chills, fever and malaise/fatigue.   HENT: Positive for ear pain. Negative for congestion and sore throat.    Eyes: Negative for blurred vision, pain and redness.   Respiratory: Negative for cough and shortness of breath.    Cardiovascular: Negative for chest pain and palpitations.   Gastrointestinal: Negative for abdominal pain, nausea and vomiting.   Genitourinary: Negative for dysuria.   Musculoskeletal: Positive for joint pain (s/p right knee surgery). Negative for back pain and myalgias.   Skin: Negative for rash.   Neurological: Positive for dizziness (one episode). Negative for headaches.    Psychiatric/Behavioral: Negative for depression.     Visit Vitals   ??? BP 132/67 (BP 1 Location: Right arm, BP Patient Position: Sitting)   ??? Pulse 75   ??? Temp 97.8 ??F (36.6 ??C) (Oral)   ??? Ht 5\' 11"  (1.803 m)   ??? Wt 274 lb (124.3 kg)   ??? SpO2 95%   ??? BMI 38.22 kg/m2        Physical Exam   Constitutional: He is oriented to person, place, and time. He appears well-developed and well-nourished. No distress.   Right knee brace in place   HENT:   Head: Normocephalic and atraumatic.   Right Ear: Tympanic membrane, external ear and ear canal normal.   Left Ear: Tympanic membrane, external ear and ear canal normal.   Nose: No mucosal edema or rhinorrhea.   Mouth/Throat: Oropharynx is clear and moist. No posterior oropharyngeal edema or posterior oropharyngeal erythema.   Neck: No JVD present. No thyromegaly present.   Cardiovascular: Normal rate, regular rhythm, normal heart sounds and intact distal pulses.  Exam reveals no gallop and no friction rub.    No murmur heard.  Pulmonary/Chest: Effort normal and breath sounds normal. He has no wheezes.   Lymphadenopathy:     He has no cervical adenopathy.   Neurological: He is alert and oriented to person, place, and time.   Skin: Skin is warm and dry.   Nursing  note and vitals reviewed.      ASSESSMENT and PLAN    ICD-10-CM ICD-9-CM    1. Essential hypertension, benign I10 401.1 valsartan-hydroCHLOROthiazide (DIOVAN-HCT) 320-12.5 mg per tablet      metoprolol succinate (TOPROL-XL) 100 mg tablet      METABOLIC PANEL, COMPREHENSIVE      LIPID PANEL   2. Pure hypercholesterolemia E78.00 272.0 pravastatin (PRAVACHOL) 40 mg tablet   3. Pre-diabetes R73.03 790.29 metFORMIN (GLUCOPHAGE) 1,000 mg tablet      HEMOGLOBIN A1C WITH EAG   4. Otalgia of both ears H92.03 388.70    5. Erectile dysfunction, unspecified erectile dysfunction type N52.9 607.84 sildenafil citrate (VIAGRA) 100 mg tablet   Check labs, adjust meds if needed.  Doing well on current regimen. Praised  his weight loss - cont working on it.   Discussed his low testosterone - it is not safe to put him back on any testosterone replacement, cont to follow w/ oncology for the polycythemia which he understands. Will do viagra to use prn for ED.  Congestion/ear symptoms - normal on exam - try daily claritin qam, benadryl qhs, flonase and see if helps. If not let me know and can refer to ENT.  25 minutes spent counseling patient today.

## 2016-12-20 NOTE — Progress Notes (Signed)
Outreach call to patient for TOC follow up. Patient states he is doing well with PT for his knee.  He is no longer having pain, just some swelling on occasion and uses ice.  Hypertension is well controlled.  Trying to watch his eating until he can get back to walking for exercise.    Patient has been to his scheduled appointments.  Does not have any needs at this point.  Agreeable to another TOC call.  Appreciative of the follow up.

## 2016-12-21 NOTE — Progress Notes (Signed)
[facility]   [facility] [address]  [address], [address] [place] [id number]  Phone: [phone]   Fax: [phone]    OUTPATIENT DAILY NOTE    NAME/AGE/GENDER: Steven [name] is a 61 y.o. male.     DATE: 12/21/2016    Patient cancelled his appointment for today due to out of town.  Will plan to follow up on next scheduled visit.    Allene DillonJennifer N Dorinda Stehr, PT

## 2016-12-22 ENCOUNTER — Inpatient Hospital Stay: Payer: PRIVATE HEALTH INSURANCE | Primary: Family Medicine

## 2016-12-25 ENCOUNTER — Encounter: Payer: BLUE CROSS/BLUE SHIELD | Primary: Family Medicine

## 2016-12-26 ENCOUNTER — Inpatient Hospital Stay: Admit: 2016-12-26 | Payer: PRIVATE HEALTH INSURANCE | Primary: Family Medicine

## 2016-12-26 NOTE — Progress Notes (Signed)
Steven [name]  DOB: [birth year]  Primary: [name] [name] [facility]*  Secondary:  [place] [facility]  [address], [place] [address], [place]  [phone]   [phone]          OUTPATIENT PHYSICAL THERAPY:Progress Report 12/26/2016    ICD-10: Treatment Diagnosis:   ?? Pain in right knee (M25.561)  ?? Stiffness of right knee, not elsewhere classified (M25.661)  Precautions/Allergies:   Nsaids (non-steroidal anti-inflammatory drug)   Fall Risk Score: 1 (? 5 = High Risk)  MD Orders: Evaluate and Treat MEDICAL/REFERRING DIAGNOSIS:  S/p right knee scope; ACL reconstruction utilizing allograft  DATE OF ONSET: Surgery: 11/23/2016  REFERRING PHYSICIAN: Maryla Morrow., MD  RETURN PHYSICIAN APPOINTMENT: 12/05/2016     ASSESSMENT:  [name] presents with decreased mobility, decreased strength, and pain in right knee secondary to ACL repair.  Patient has attended a total of 7 scheduled physical therapy visits including initial evaluation on 11/28/2016.  Treatment has consisted of strengthening, stretching, manual therapy, and modalities to improve overall pain, mobility, and performance with activities of daily living.       PROBLEM LIST (Impacting functional limitations):  1. Decreased Strength  2. Decreased ADL/Functional Activities  3. Decreased Ambulation Ability/Technique  4. Decreased Balance  5. Increased Pain  6. Decreased Activity Tolerance INTERVENTIONS PLANNED:  1. Balance Exercise  2. Cold  3. Gait Training  4. Heat  5. Home Exercise Program (HEP)  6. Manual Therapy  7. Neuromuscular Re-education/Strengthening  8. Range of Motion (ROM)  9. Therapeutic Activites  10. Therapeutic Exercise/Strengthening   TREATMENT PLAN:  Effective Dates: 11/28/2016 TO 02/25/2017.  Frequency/Duration: 2 times a week for 8 weeks  GOALS: (Goals have been discussed and agreed upon with patient.)  Short-Term Functional Goals: Time Frame: 3 weeks   1. Patient will be independent with home exercise program without exacerbation of symptoms or cueing needed--goal met.   2. Patient will be independent with correct sleeping positions and awareness/avoidance of aggravating positions without cueing needed--goal met.   Discharge Goals: Time Frame: 12 weeks  1. Patient will be independent with all ADLs with minimal onset of right knee pain and no deficits with daily tasks--goal ongoing.  2. Patient will report no fear avoidance with social or recreational activities due to right knee pain--goal ongoing.  3. Patient will score less than or equal to 50/80 on Lower Extremity Functional Scale with minimal effect of right knee pain on patient's ability to manage every day life activities--goal ongoing.  Rehabilitation Potential For Stated Goals: Good            The information in this section was collected on 12/26/2016 (except where otherwise noted).  HISTORY:   History of Present Injury/Illness (Reason for Referral):  Patient reports that he had surgery on Friday (12/24/2016).  He reports he has been doing well. He reports his pain is under control.  He reports he is ready to get back to walking and moving without fear of pain.  Past Medical History/Comorbidities:   [name]  has a past medical history of Allergic rhinitis, cause unspecified; BMI 40.0-44.9, adult ([place]); Chronic left sacroiliac joint pain; Chronic radicular low back pain; Diverticulitis; Essential hypertension, benign; Generalized anxiety disorder; GERD (gastroesophageal reflux disease); Hypercholesterolemia; Left lumbar radiculitis; Lumbar stenosis with neurogenic claudication (11/06/2015); Mood disorder ([name]) (07/14/2014); Morbid obesity ([place]); OSA on CPAP; Polycythemia (dx--06/2015); Prediabetes; Spondylolisthesis at L4-L5 level (10/19/2015); Testicular hypofunction; Thrombophlebitis of left leg ([place]) (04/2015); Venous insufficiency of  both lower extremities (07/14/2014); and Vitamin D  deficiency. He also has no past medical history of Adverse effect of anesthesia; Difficult intubation; Malignant hyperthermia due to anesthesia; Nausea & vomiting; or Pseudocholinesterase deficiency.  Steven [name]  has a past surgical history that includes endoscopy, colon, diagnostic (2011); hx colonoscopy (05/2015); hx knee arthroscopy (Bilateral, 04/2015); and hx orthopaedic (Left, 10/19/2015).  Social History/Living Environment:   Home Environment: Private residence  Living Alone: No  Support Systems: Family member(s), Spouse/Significant Other/Partner  Prior Level of Function/Work/Activity:  Independent  Dominant Side:         RIGHT  Personal Factors:          Sex:  male        Age:  61 y.o.  Current Medications:       Current Outpatient Prescriptions:   ???  valsartan-hydroCHLOROthiazide (DIOVAN-HCT) 320-12.5 mg per tablet, TAKE 1 TABLET BY MOUTH EVERY DAY, Disp: 30 Tab, Rfl: 5  ???  pravastatin (PRAVACHOL) 40 mg tablet, TAKE 1 TABLET BY MOUTH AT BEDTIME, Disp: 30 Tab, Rfl: 5  ???  metoprolol succinate (TOPROL-XL) 100 mg tablet, Take 1 Tab by mouth nightly. Indications: hypertension, Disp: 30 Tab, Rfl: 5  ???  metFORMIN (GLUCOPHAGE) 1,000 mg tablet, TAKE ONE TABLET BY MOUTH TWICE A DAY, Disp: 60 Tab, Rfl: 5  ???  sildenafil citrate (VIAGRA) 100 mg tablet, Take 1 Tab by mouth as needed., Disp: 12 Tab, Rfl: 5  ???  OMEPRAZOLE PO, Take  by mouth as needed., Disp: , Rfl:   ???  diclofenac EC (VOLTAREN) 50 mg EC tablet, Take 1 Tab by mouth two (2) times a day. (Patient taking differently: Take 50 mg by mouth as needed.), Disp: 180 Tab, Rfl: 2  ???  aspirin (ASPIRIN LOW-STRENGTH) 81 mg chewable tablet, Take 81 mg by mouth every morning. Indications: continue, Disp: , Rfl:   ???  MULTI-VITAMIN HI-PO PO, Take 2 Tabs by mouth every morning. Indications: hold until after surgery, Disp: , Rfl:    Date Last Reviewed:  12/26/2016    Number of Personal Factors/Comorbidities that affect the Plan of Care: 1-2: MODERATE COMPLEXITY   EXAMINATION:    Observation/Orthostatic Postural Assessment:          Posture Assessment: Rounded shoulders, Forward head   Palpation:          Swelling noted in calf region with mild bruising noted behind right knee.  Scars and stitches are intact.    ROM:          R LE Assessment (AROM):   ?? Hip Flexion: 80 degrees    ?? Hip Extension: 0 degrees    ?? Hip Abduction: 10 degrees    ?? Hip Adduction: 0 degrees    ?? Knee Flexion: 85 degrees    ?? Knee Extension: 10 degrees     Strength:          R LE Assessment (Strength):   ?? Hip Flexion: 3+/5 with manual muscle testing    ?? Hip Extension: 3+/5 with manual muscle testing    ?? Hip Abduction: 3+/5 with manual muscle testing    ?? Hip Adduction: 3+/5 with manual muscle testing    ?? Knee Flexion: 3/5 with manual muscle testing    ?? Knee Extension: 3/5 with manual muscle testing     Special Tests:          Not tested    Neurological Screen:        Dermatomes:  Within normal limits  Reflexes:  2+    Functional Mobility:         Gait/Ambulation:     Base of Support: Center of gravity altered  Speed/Cadence: Pace decreased (<100 feet/min)  Step Length: Left shortened, Right lengthened  Swing Pattern: Left asymmetrical, Right symmetrical  Stance: Left decreased, Right increased  Gait Abnormalities: Antalgic  Ambulation - Level of Assistance: Modified independent  Assistive Device: Walker, rolling  ?? Interventions: Verbal cues, Safety awareness training          Transfers:     Sit to Stand: Modified independent  Stand to Sit: Modified independent  Stand Pivot Transfers: Modified independent  Bed to Chair: Modified independent  ?? Lateral Transfers: Modified independent          Bed Mobility:     Rolling: Independent  Supine to Sit: Independent  Sit to Supine: Independent  Scooting: Independent     Balance:          Intact   Body Structures Involved:  1. Joints  2. Muscles Body Functions Affected:  1. Neuromusculoskeletal  2. Movement Related Activities and Participation Affected:   1. Mobility  2. Self Care   Number of elements (examined above) that affect the Plan of Care: 4+: HIGH COMPLEXITY   CLINICAL PRESENTATION:   Presentation: Evolving clinical presentation with changing clinical characteristics: MODERATE COMPLEXITY   CLINICAL DECISION MAKING:   Outcome Measure:   Tool Used: Lower Extremity Functional Scale (LEFS)  Score:  Initial: 17/80 Most Recent: 25/80 (Date: 12/26/2016 )   Interpretation of Score: 20 questions each scored on a 5 point scale with 0 representing "extreme difficulty or unable to perform" and 4 representing "no difficulty".  The lower the score, the greater the functional disability. 80/80 represents no disability.  Minimal detectable change is 9 points.  Score 80 79-63 62-48 47-32 31-16 15-1 0   Modifier CH CI CJ CK CL CM CN     Medical Necessity:   ?? Patient is expected to demonstrate progress in strength, range of motion, balance and coordination to increase independence with daily activities.  ?? Patient demonstrates good rehab potential due to higher previous functional level.  ?? Skilled intervention continues to be required due to decreased mobility.  Reason for Services/Other Comments:  ?? Patient continues to demonstrate capacity to improve overall mobility which will increase independence and increase safety.   Use of outcome tool(s) and clinical judgement create a POC that gives a: Questionable prediction of patient's progress: MODERATE COMPLEXITY            TREATMENT:   (In addition to Assessment/Re-Assessment sessions the following treatments were rendered)  Pre-treatment Symptoms/Complaints:  12/26/2016:  Patient reports he is feeling ok today.  He reports he just returned from the eye doctor and had something removed from his eye so he has to take it easy for a few days.  Pain: Initial:   Pain Intensity 1: 5  Pain Location 1: Knee  Pain Orientation 1: Right  Pain Intervention(s) 1: Rest, Medication (see MAR)  Post Session:  4/10      THERAPEUTIC EXERCISE: (25 minutes):  Exercises per grid below to improve mobility, strength, balance and coordination.  Required minimal verbal and manual cues to promote proper body alignment, promote proper body posture and promote proper body mechanics.  Progressed resistance, range, repetitions and complexity of movement as indicated.   Date:  12/26/2016   Activity/Exercise Parameters   Airdyne 7 minutes  Seat #8   Mini-squats X  TKE Blue t-band  20 reps   Calf stretch on slant board X    Standing heel raises X    LAQ 2 pounds  15 reps  B LE   Hip flexion/Straight leg raise with quad set X       MANUAL THERAPY: (10 minutes): Joint mobilization and patellar mobilization was utilized and necessary because of the patient's restricted joint motion, painful spasm, loss of articular motion and restricted motion of soft tissue.   MODALITIES: (10 minutes):      *  Cold Pack Therapy in order to provide analgesia, relieve muscle spasm and reduce inflammation and edema.      MedBridge Portal    Treatment/Session Assessment:    ?? Response to Treatment: Patient tolerated treatment with decreased pain after treatment.  Continue plan of care.  Decreased exercises today secondary to eye doctor restrictions.    ?? Compliance with Program/Exercises: Compliant  ?? Recommendations/Intent for next treatment session: "Next visit will focus on advancements to more challenging activities".  Total Treatment Duration:  PT Patient Time In/Time Out  Time In: 1345  Time Out: [place], PT

## 2016-12-27 ENCOUNTER — Other Ambulatory Visit: Admit: 2016-12-27 | Discharge: 2016-12-27 | Payer: BLUE CROSS/BLUE SHIELD | Primary: Family Medicine

## 2016-12-27 DIAGNOSIS — I10 Essential (primary) hypertension: Secondary | ICD-10-CM

## 2016-12-28 ENCOUNTER — Inpatient Hospital Stay: Admit: 2016-12-28 | Payer: PRIVATE HEALTH INSURANCE | Primary: Family Medicine

## 2016-12-28 DIAGNOSIS — M25561 Pain in right knee: Secondary | ICD-10-CM

## 2016-12-28 LAB — METABOLIC PANEL, COMPREHENSIVE
A-G Ratio: 1.7 (ref 1.2–2.2)
ALT (SGPT): 39 IU/L (ref 0–44)
AST (SGOT): 23 IU/L (ref 0–40)
Albumin: 4.3 g/dL (ref 3.5–5.5)
Alk. phosphatase: 83 IU/L (ref 39–117)
BUN/Creatinine ratio: 25 — ABNORMAL HIGH (ref 9–20)
BUN: 29 mg/dL — ABNORMAL HIGH (ref 6–24)
Bilirubin, total: 0.3 mg/dL (ref 0.0–1.2)
CO2: 21 mmol/L (ref 18–29)
Calcium: 9.3 mg/dL (ref 8.7–10.2)
Chloride: 103 mmol/L (ref 96–106)
Creatinine: 1.15 mg/dL (ref 0.76–1.27)
GFR est AA: 80 mL/min/{1.73_m2} (ref >59)
GFR est non-AA: 69 mL/min/{1.73_m2} (ref >59)
GLOBULIN, TOTAL: 2.6 g/dL (ref 1.5–4.5)
Glucose: 123 mg/dL — ABNORMAL HIGH (ref 65–99)
Potassium: 4.8 mmol/L (ref 3.5–5.2)
Protein, total: 6.9 g/dL (ref 6.0–8.5)
Sodium: 140 mmol/L (ref 134–144)

## 2016-12-28 LAB — HEMOGLOBIN A1C WITH EAG
Estimated average glucose: 140 mg/dL
Hemoglobin A1c: 6.5 % — ABNORMAL HIGH (ref 4.8–5.6)

## 2016-12-28 LAB — LIPID PANEL
Cholesterol, total: 193 mg/dL (ref 100–199)
HDL Cholesterol: 32 mg/dL — ABNORMAL LOW (ref >39)
LDL, calculated: 83 mg/dL (ref 0–99)
Triglyceride: 389 mg/dL — ABNORMAL HIGH (ref 0–149)
VLDL, calculated: 78 mg/dL — ABNORMAL HIGH (ref 5–40)

## 2016-12-28 NOTE — Progress Notes (Signed)
Steven [name]  DOB: 11-02-1955  Primary: [place]*  Secondary:  [place] [facility]  [address], [place] [address], [place]  [phone]   [phone]          OUTPATIENT PHYSICAL THERAPY:Daily Note 12/28/2016    ICD-10: Treatment Diagnosis:   ?? Pain in right knee (M25.561)  ?? Stiffness of right knee, not elsewhere classified (M25.661)  Precautions/Allergies:   Nsaids (non-steroidal anti-inflammatory drug)   Fall Risk Score: 1 (? 5 = High Risk)  MD Orders: Evaluate and Treat MEDICAL/REFERRING DIAGNOSIS:  S/p right knee scope; ACL reconstruction utilizing allograft  DATE OF ONSET: Surgery: 11/23/2016  REFERRING PHYSICIAN: Maryla Morrow., MD  RETURN PHYSICIAN APPOINTMENT: 12/05/2016     ASSESSMENT:  Steven [name] presents with decreased mobility, decreased strength, and pain in right knee secondary to ACL repair.  Patient has attended a total of 8 scheduled physical therapy visits including initial evaluation on 11/28/2016.  Treatment has consisted of strengthening, stretching, manual therapy, and modalities to improve overall pain, mobility, and performance with activities of daily living.       PROBLEM LIST (Impacting functional limitations):  1. Decreased Strength  2. Decreased ADL/Functional Activities  3. Decreased Ambulation Ability/Technique  4. Decreased Balance  5. Increased Pain  6. Decreased Activity Tolerance INTERVENTIONS PLANNED:  1. Balance Exercise  2. Cold  3. Gait Training  4. Heat  5. Home Exercise Program (HEP)  6. Manual Therapy  7. Neuromuscular Re-education/Strengthening  8. Range of Motion (ROM)  9. Therapeutic Activites  10. Therapeutic Exercise/Strengthening   TREATMENT PLAN:  Effective Dates: 11/28/2016 TO 02/25/2017.  Frequency/Duration: 2 times a week for 8 weeks  GOALS: (Goals have been discussed and agreed upon with patient.)  Short-Term Functional Goals: Time Frame: 3 weeks   1. Patient will be independent with home exercise program without exacerbation of symptoms or cueing needed--goal met.   2. Patient will be independent with correct sleeping positions and awareness/avoidance of aggravating positions without cueing needed--goal met.   Discharge Goals: Time Frame: 12 weeks  1. Patient will be independent with all ADLs with minimal onset of right knee pain and no deficits with daily tasks--goal ongoing.  2. Patient will report no fear avoidance with social or recreational activities due to right knee pain--goal ongoing.  3. Patient will score less than or equal to 50/80 on Lower Extremity Functional Scale with minimal effect of right knee pain on patient's ability to manage every day life activities--goal ongoing.  Rehabilitation Potential For Stated Goals: Good            The information in this section was collected on 12/26/2016 (except where otherwise noted).  HISTORY:   History of Present Injury/Illness (Reason for Referral):  Patient reports that he had surgery on Friday (12/24/2016).  He reports he has been doing well. He reports his pain is under control.  He reports he is ready to get back to walking and moving without fear of pain.  Past Medical History/Comorbidities:   Steven [name]  has a past medical history of Allergic rhinitis, cause unspecified; BMI 40.0-44.9, adult ([place]); Chronic left sacroiliac joint pain; Chronic radicular low back pain; Diverticulitis; Essential hypertension, benign; Generalized anxiety disorder; GERD (gastroesophageal reflux disease); Hypercholesterolemia; Left lumbar radiculitis; Lumbar stenosis with neurogenic claudication (11/06/2015); Mood disorder ([place]) (07/14/2014); Morbid obesity ([place]); OSA on CPAP; Polycythemia (dx--06/2015); Prediabetes; Spondylolisthesis at L4-L5 level (10/19/2015); Testicular hypofunction; Thrombophlebitis of left leg ([place]) (04/2015); Venous insufficiency of  both lower extremities (07/14/2014); and Vitamin D  deficiency. He also has no past medical history of Adverse effect of anesthesia; Difficult intubation; Malignant hyperthermia due to anesthesia; Nausea & vomiting; or Pseudocholinesterase deficiency.  Steven [name]  has a past surgical history that includes endoscopy, colon, diagnostic (2011); hx colonoscopy (05/2015); hx knee arthroscopy (Bilateral, 04/2015); and hx orthopaedic (Left, 10/19/2015).  Social History/Living Environment:   Home Environment: Private residence  Living Alone: No  Support Systems: Family member(s), Spouse/Significant Other/Partner  Prior Level of Function/Work/Activity:  Independent  Dominant Side:         RIGHT  Personal Factors:          Sex:  male        Age:  61 y.o.  Current Medications:       Current Outpatient Prescriptions:   ???  valsartan-hydroCHLOROthiazide (DIOVAN-HCT) 320-12.5 mg per tablet, TAKE 1 TABLET BY MOUTH EVERY DAY, Disp: 30 Tab, Rfl: 5  ???  pravastatin (PRAVACHOL) 40 mg tablet, TAKE 1 TABLET BY MOUTH AT BEDTIME, Disp: 30 Tab, Rfl: 5  ???  metoprolol succinate (TOPROL-XL) 100 mg tablet, Take 1 Tab by mouth nightly. Indications: hypertension, Disp: 30 Tab, Rfl: 5  ???  metFORMIN (GLUCOPHAGE) 1,000 mg tablet, TAKE ONE TABLET BY MOUTH TWICE A DAY, Disp: 60 Tab, Rfl: 5  ???  sildenafil citrate (VIAGRA) 100 mg tablet, Take 1 Tab by mouth as needed., Disp: 12 Tab, Rfl: 5  ???  OMEPRAZOLE PO, Take  by mouth as needed., Disp: , Rfl:   ???  diclofenac EC (VOLTAREN) 50 mg EC tablet, Take 1 Tab by mouth two (2) times a day. (Patient taking differently: Take 50 mg by mouth as needed.), Disp: 180 Tab, Rfl: 2  ???  aspirin (ASPIRIN LOW-STRENGTH) 81 mg chewable tablet, Take 81 mg by mouth every morning. Indications: continue, Disp: , Rfl:   ???  MULTI-VITAMIN HI-PO PO, Take 2 Tabs by mouth every morning. Indications: hold until after surgery, Disp: , Rfl:    Date Last Reviewed:  12/28/2016    Number of Personal Factors/Comorbidities that affect the Plan of Care: 1-2: MODERATE COMPLEXITY   EXAMINATION:    Observation/Orthostatic Postural Assessment:          Posture Assessment: Rounded shoulders, Forward head   Palpation:          Swelling noted in calf region with mild bruising noted behind right knee.  Scars and stitches are intact.    ROM:          R LE Assessment (AROM):   ?? Hip Flexion: 80 degrees    ?? Hip Extension: 0 degrees    ?? Hip Abduction: 10 degrees    ?? Hip Adduction: 0 degrees    ?? Knee Flexion: 85 degrees    ?? Knee Extension: 10 degrees     Strength:          R LE Assessment (Strength):   ?? Hip Flexion: 3+/5 with manual muscle testing    ?? Hip Extension: 3+/5 with manual muscle testing    ?? Hip Abduction: 3+/5 with manual muscle testing    ?? Hip Adduction: 3+/5 with manual muscle testing    ?? Knee Flexion: 3/5 with manual muscle testing    ?? Knee Extension: 3/5 with manual muscle testing     Special Tests:          Not tested    Neurological Screen:        Dermatomes:  Within normal limits  Reflexes:  2+    Functional Mobility:         Gait/Ambulation:     Base of Support: Center of gravity altered  Speed/Cadence: Pace decreased (<100 feet/min)  Step Length: Left shortened, Right lengthened  Swing Pattern: Left asymmetrical, Right symmetrical  Stance: Left decreased, Right increased  Gait Abnormalities: Antalgic  Ambulation - Level of Assistance: Modified independent  Assistive Device: Walker, rolling  ?? Interventions: Verbal cues, Safety awareness training          Transfers:     Sit to Stand: Modified independent  Stand to Sit: Modified independent  Stand Pivot Transfers: Modified independent  Bed to Chair: Modified independent  ?? Lateral Transfers: Modified independent          Bed Mobility:     Rolling: Independent  Supine to Sit: Independent  Sit to Supine: Independent  Scooting: Independent     Balance:          Intact   Body Structures Involved:  1. Joints  2. Muscles Body Functions Affected:  1. Neuromusculoskeletal  2. Movement Related Activities and Participation Affected:   1. Mobility  2. Self Care   Number of elements (examined above) that affect the Plan of Care: 4+: HIGH COMPLEXITY   CLINICAL PRESENTATION:   Presentation: Evolving clinical presentation with changing clinical characteristics: MODERATE COMPLEXITY   CLINICAL DECISION MAKING:   Outcome Measure:   Tool Used: Lower Extremity Functional Scale (LEFS)  Score:  Initial: 17/80 Most Recent: 25/80 (Date: 12/26/2016 )   Interpretation of Score: 20 questions each scored on a 5 point scale with 0 representing "extreme difficulty or unable to perform" and 4 representing "no difficulty".  The lower the score, the greater the functional disability. 80/80 represents no disability.  Minimal detectable change is 9 points.  Score 80 79-63 62-48 47-32 31-16 15-1 0   Modifier CH CI CJ CK CL CM CN     Medical Necessity:   ?? Patient is expected to demonstrate progress in strength, range of motion, balance and coordination to increase independence with daily activities.  ?? Patient demonstrates good rehab potential due to higher previous functional level.  ?? Skilled intervention continues to be required due to decreased mobility.  Reason for Services/Other Comments:  ?? Patient continues to demonstrate capacity to improve overall mobility which will increase independence and increase safety.   Use of outcome tool(s) and clinical judgement create a POC that gives a: Questionable prediction of patient's progress: MODERATE COMPLEXITY            TREATMENT:   (In addition to Assessment/Re-Assessment sessions the following treatments were rendered)  Pre-treatment Symptoms/Complaints:  12/28/2016:  Patient reports he has been wearing his compression sock because his ankle has been swelling.  He also reports he will catch himself sitting a lot during the day.  Pain: Initial:   Pain Intensity 1: 5  Pain Location 1: Knee  Pain Orientation 1: Right  Pain Intervention(s) 1: Rest, Medication (see MAR)  Post Session:  4/10      THERAPEUTIC EXERCISE: (35 minutes):  Exercises per grid below to improve mobility, strength, balance and coordination.  Required minimal verbal and manual cues to promote proper body alignment, promote proper body posture and promote proper body mechanics.  Progressed resistance, range, repetitions and complexity of movement as indicated.   Date:  12/28/2016   Activity/Exercise Parameters   Airdyne 7 minutes  Seat #7   Marching in hallway for balance 2 laps  Walking backward in hallway for balance 2 laps   Calf stretch on slant board 10 second hold  5 reps   Tiltboard: right/left 2 minutes  No UE assist   Tiltboard: forward/backward 2 minutes  No UE assist   Standing heel raises 15 reps   Standing toe raises 15 reps   Mini-squats against wall 15 reps   Seated LAQ 15 reps  B LE   SLR: hip flexion 15 reps  R LE   SLR: side-slying hip abduction 15 reps  R LE   SLR: prone hip extension 15 reps  R LE   Prone knee flexion 15 reps  R LE      MANUAL THERAPY: (0 minutes): Joint mobilization and patellar mobilization was utilized and necessary because of the patient's restricted joint motion, painful spasm, loss of articular motion and restricted motion of soft tissue.   MODALITIES: (0 minutes):      *  Cold Pack Therapy in order to provide analgesia, relieve muscle spasm and reduce inflammation and edema.      MedBridge Portal    Treatment/Session Assessment:    ?? Response to Treatment: Patient tolerated treatment with decreased pain/stiffness after treatment.  Continue plan of care.  Patient was 10 minutes late to appointment.    ?? Compliance with Program/Exercises: Compliant  ?? Recommendations/Intent for next treatment session: "Next visit will focus on advancements to more challenging activities".  Total Treatment Duration:  PT Patient Time In/Time Out  Time In: 1400  Time Out: [place], PT

## 2016-12-31 ENCOUNTER — Encounter

## 2016-12-31 NOTE — Telephone Encounter (Signed)
Patient is not better and would like to get a referral to ENT

## 2016-12-31 NOTE — Telephone Encounter (Signed)
Patient notified

## 2016-12-31 NOTE — Telephone Encounter (Signed)
done

## 2017-01-01 NOTE — Telephone Encounter (Signed)
Patient notified

## 2017-01-01 NOTE — Telephone Encounter (Signed)
-----   Message from Allene DillonLindsey Caston Cecil, MD sent at 12/31/2016  1:22 PM EST -----  A1C same at 6.5. Chol numbers look good except TG high at 389 - is up from 297 last time. Really watch diet to try to bring this back down. His kidney function looked good this time. Recheck labs in 6 mos.  Also we put in an ENT referral for him

## 2017-01-02 NOTE — Progress Notes (Signed)
[facility]   Medical Office Building [address]  [address], [address] [place], [place] [id number]  Phone: [phone]   Fax: [phone]    OUTPATIENT DAILY NOTE    NAME/AGE/GENDER: Steven DuboisMichael L Crossen is a 61 y.o. male.     DATE: 01/02/2017    Patient cancelled his appointment for today due to illness.  Will plan to follow up on next scheduled visit.    Allene DillonJennifer N Rivan Siordia, PT

## 2017-01-03 ENCOUNTER — Inpatient Hospital Stay: Payer: PRIVATE HEALTH INSURANCE | Primary: Family Medicine

## 2017-01-03 MED ORDER — AMOXICILLIN CLAVULANATE 875 MG-125 MG TAB
875-125 mg | ORAL_TABLET | Freq: Two times a day (BID) | ORAL | 0 refills | Status: AC
Start: 2017-01-03 — End: 2017-01-13

## 2017-01-03 NOTE — Telephone Encounter (Signed)
Pt is still not feeling well. Pt left side of face is swollen , in pain and teeth hurting. Pt had fever last night.

## 2017-01-03 NOTE — Telephone Encounter (Signed)
Sounds like has sinus infection that is getting worse - I have sent augmentin to his pharmacy. RTC if not improving

## 2017-01-04 NOTE — Progress Notes (Signed)
[facility]   Medical Office Building [address]  [address], [address] [place], [place] [address]  Phone: [phone]   Fax: [phone]    OUTPATIENT DAILY NOTE    NAME/AGE/GENDER: Barton DuboisMichael L Shomaker is a 61 y.o. male.     DATE: 01/04/2017    Patient cancelled his appointment for today due to illness.  Will plan to follow up on next scheduled visit.    Allene DillonJennifer N Quantisha Marsicano, PT

## 2017-01-05 ENCOUNTER — Inpatient Hospital Stay: Payer: PRIVATE HEALTH INSURANCE | Primary: Family Medicine

## 2017-01-09 ENCOUNTER — Inpatient Hospital Stay: Admit: 2017-01-09 | Payer: PRIVATE HEALTH INSURANCE | Attending: Hematology & Oncology | Primary: Family Medicine

## 2017-01-09 DIAGNOSIS — K760 Fatty (change of) liver, not elsewhere classified: Secondary | ICD-10-CM

## 2017-01-09 MED ORDER — SALINE PERIPHERAL FLUSH PRN
Freq: Once | INTRAMUSCULAR | Status: AC
Start: 2017-01-09 — End: 2017-01-09
  Administered 2017-01-09: 13:00:00

## 2017-01-09 MED ORDER — GADOBENATE DIMEGLUMINE 529 MG/ML (0.1 MMOL/0.2 ML) IV
529 mg/mL (0.1mmol/0.2mL) | Freq: Once | INTRAVENOUS | Status: AC
Start: 2017-01-09 — End: 2017-01-09
  Administered 2017-01-09: 13:00:00 via INTRAVENOUS

## 2017-01-09 MED ORDER — SODIUM CHLORIDE 0.9% BOLUS IV
0.9 % | Freq: Once | INTRAVENOUS | Status: AC
Start: 2017-01-09 — End: 2017-01-09
  Administered 2017-01-09: 13:00:00 via INTRAVENOUS

## 2017-01-15 ENCOUNTER — Encounter

## 2017-01-17 ENCOUNTER — Ambulatory Visit
Admit: 2017-01-17 | Discharge: 2017-01-17 | Payer: PRIVATE HEALTH INSURANCE | Attending: Hematology & Oncology | Primary: Family Medicine

## 2017-01-17 ENCOUNTER — Inpatient Hospital Stay: Admit: 2017-01-17 | Payer: PRIVATE HEALTH INSURANCE | Primary: Family Medicine

## 2017-01-17 DIAGNOSIS — D751 Secondary polycythemia: Secondary | ICD-10-CM

## 2017-01-17 LAB — CBC WITH AUTOMATED DIFF
ABS. BASOPHILS: 0 10*3/uL (ref 0.0–0.2)
ABS. EOSINOPHILS: 0.1 10*3/uL (ref 0.0–0.8)
ABS. LYMPHOCYTES: 1.8 10*3/uL (ref 0.5–4.6)
ABS. MONOCYTES: 0.6 10*3/uL (ref 0.1–1.3)
ABS. NEUTROPHILS: 3.9 10*3/uL (ref 1.7–8.2)
ABSOLUTE NRBC: 0 10*3/uL (ref 0.0–0.2)
BASOPHILS: 1 % (ref 0.0–2.0)
EOSINOPHILS: 1 % (ref 0.5–7.8)
HCT: 41.5 % (ref 41.1–50.3)
HGB: 15 g/dL (ref 13.6–17.2)
LYMPHOCYTES: 29 % (ref 13–44)
MCH: 31.1 PG (ref 26.1–32.9)
MCHC: 36.1 g/dL — ABNORMAL HIGH (ref 31.4–35.0)
MCV: 86.1 FL (ref 79.6–97.8)
MONOCYTES: 9 % (ref 4.0–12.0)
MPV: 9.9 FL — ABNORMAL LOW (ref 10.8–14.1)
NEUTROPHILS: 61 % (ref 43–78)
PLATELET: 239 10*3/uL (ref 150–450)
RBC: 4.82 M/uL (ref 4.23–5.67)
RDW: 13.6 % (ref 11.9–14.6)
WBC: 6.3 10*3/uL (ref 4.3–11.1)

## 2017-01-17 LAB — METABOLIC PANEL, COMPREHENSIVE
A-G Ratio: 1.1 — ABNORMAL LOW (ref 1.2–3.5)
ALT (SGPT): 54 U/L (ref 12–65)
AST (SGOT): 26 U/L (ref 15–37)
Albumin: 4 g/dL (ref 3.5–5.0)
Alk. phosphatase: 82 U/L (ref 50–136)
Anion gap: 8 mmol/L (ref 7–16)
BUN: 26 MG/DL — ABNORMAL HIGH (ref 6–23)
Bilirubin, total: 0.5 MG/DL (ref 0.2–1.1)
CO2: 23 mmol/L (ref 21–32)
Calcium: 9.1 MG/DL (ref 8.3–10.4)
Chloride: 106 mmol/L (ref 98–107)
Creatinine: 1.52 MG/DL — ABNORMAL HIGH (ref 0.8–1.5)
GFR est AA: >60 mL/min/{1.73_m2} (ref >60)
GFR est non-AA: 50 mL/min/{1.73_m2} — ABNORMAL LOW (ref >60)
Globulin: 3.6 g/dL — ABNORMAL HIGH (ref 2.3–3.5)
Glucose: 83 mg/dL (ref 65–100)
Potassium: 4.5 mmol/L (ref 3.5–5.1)
Protein, total: 7.6 g/dL (ref 6.3–8.2)
Sodium: 137 mmol/L (ref 136–145)

## 2017-01-17 LAB — HEMOGLOBIN A1C WITH EAG
Est. average glucose: 151 mg/dL
Hemoglobin A1c: 6.9 % — ABNORMAL HIGH (ref 4.8–6.0)

## 2017-01-17 NOTE — Progress Notes (Signed)
Data Source: Patient, [facility] record.    01/17/2017    10:23 AM    Steven [name] [id number]    61 y.o.      Patient Encounter: [facility] Visit    Heme Diagnosis:  polycythemia  Heme History (Copied from prior):   23 male, non-smoker, commercial sign business owner, h/o obesity, b/l knee arthroscopy (6/16), vit D deficiency, GERD, diverticulitis, allergic rhinitis, lower back pain related to herniated disc 's with plans to undergo laminectomy & fusion. Surgery was deferred related to insurance approval however was also found to have polycythemia, and as such referred to [place]. Review of labs reveals Hgb 18-19 range since 9/13. Hct 55-61 range. Other cell lines are normal. More recently has had some increase in his Cr 1.4-1.6. Recently seen by pulmonology, and diagnosed w moderate sleep apnea, and now on CPAP x 1 month, reports being regular w/ it as makes him sleep better. Notes chronic LE swelling x 3 yeas. Notes having had a detailed cardiac evaluation and was unremarkable.   Interval History:  03/18: Doing well. Has lost weight 15 lbs since last visit by making dietary changes. Has remained off testosterone. Hgb/Hct normal now. S/p RT knee ACL surgery, did well w enoxaparin post-procedure x 5 days. MRI liver with hepatic steatosis otherwise unremarkable.   NCCN Distress Score:  -  REVIEW OF SYSTEMS:  As mentioned above, all other systems were reviewed in full and are negative.    Past Medical History:   Diagnosis Date   ??? Allergic rhinitis, cause unspecified    ??? BMI 40.0-44.9, adult ([place])    ??? Chronic left sacroiliac joint pain    ??? Chronic radicular low back pain         ??? Diverticulitis    ??? Essential hypertension, benign    ??? Generalized anxiety disorder    ??? GERD (gastroesophageal reflux disease)     occ no meds   ??? Hypercholesterolemia    ??? Left lumbar radiculitis     He has had low back pain that radiates down his LLE for years.    ??? 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 Mood disorder ([place]) 07/14/2014   ??? Morbid obesity ([place])     bmi = 40   ??? OSA on CPAP     wears cpap at hs   ??? Polycythemia dx--06/2015    followed by Dr Faith Rogue (heme-onc); "Advised to consider stopping testosterone replacement. Serial monitoring of hct: phlebotomy only if hct over 65%" per office note 10/10/16. Hgb 18-19 since 06/2012.   ??? Prediabetes     does not check BG at home, metformin daily, last hgba1c- 6.5 (05/2016)   ??? Spondylolisthesis at L4-L5 level 10/19/2015   ??? Testicular hypofunction    ??? Thrombophlebitis of left leg (HCC) 04/2015    posterior tibial vein and peroneal vein   ??? Venous insufficiency of both lower extremities 07/14/2014   ??? Vitamin D deficiency             Past Surgical History:   Procedure Laterality Date   ??? ENDOSCOPY, COLON, DIAGNOSTIC  2011    Diverticulosis   ??? HX COLONOSCOPY  05/2015    Diverticulosis   ??? HX KNEE ARTHROSCOPY Bilateral 04/2015    Partial medial and lateral meniscectomies by Dr. Eliseo Squires   ??? HX ORTHOPAEDIC Left 10/19/2015    L4-5 TLIF, decompression. Dr. Raul Del       Current Outpatient Prescriptions   Medication Sig  Dispense Refill   ??? valsartan-hydroCHLOROthiazide (DIOVAN-HCT) 320-12.5 mg per tablet TAKE 1 TABLET BY MOUTH EVERY DAY 30 Tab 5   ??? pravastatin (PRAVACHOL) 40 mg tablet TAKE 1 TABLET BY MOUTH AT BEDTIME 30 Tab 5   ??? metoprolol succinate (TOPROL-XL) 100 mg tablet Take 1 Tab by mouth nightly. Indications: hypertension 30 Tab 5   ??? metFORMIN (GLUCOPHAGE) 1,000 mg tablet TAKE ONE TABLET BY MOUTH TWICE A DAY 60 Tab 5   ??? sildenafil citrate (VIAGRA) 100 mg tablet Take 1 Tab by mouth as needed. 12 Tab 5   ??? OMEPRAZOLE PO Take  by mouth as needed.     ??? diclofenac EC (VOLTAREN) 50 mg EC tablet Take 1 Tab by mouth two (2) times a day. (Patient taking differently: Take 50 mg by mouth as needed.) 180 Tab 2   ??? aspirin (ASPIRIN LOW-STRENGTH) 81 mg chewable tablet Take 81 mg by mouth  every morning. Indications: continue     ??? MULTI-VITAMIN HI-PO PO Take 2 Tabs by mouth every morning. Indications: hold until after surgery         Social History     Social History   ??? Marital status: MARRIED     Spouse name: N/A   ??? Number of children: N/A   ??? Years of education: N/A     Social History Main Topics   ??? Smoking status: Never Smoker   ??? Smokeless tobacco: Never Used   ??? Alcohol use 0.6 oz/week     1 Cans of beer per week      Comment: social   ??? Drug use: No   ??? Sexual activity: Not Asked     Other Topics Concern   ??? None     Social History Narrative       Family History   Problem Relation Age of Onset   ??? Diabetes Mother    ??? Alzheimer Mother    ??? Heart Disease Father      atherosclerosis (heavy smoker)   ??? Diabetes Sister        Allergies   Allergen Reactions   ??? Nsaids (Non-Steroidal Anti-Inflammatory Drug) Other (comments)     Cause pedal edema if taken for a number of days in a row       PHYSICAL EXAMINATION:  General Appearance: Healthy appearing patient in no acute distress  Vitals reviewed.   Visit Vitals   ??? BP 146/88 (BP 1 Location: Left arm, BP Patient Position: Standing)   ??? Pulse 74   ??? Temp 97.7 ??F (36.5 ??C) (Oral)   ??? Resp 18   ??? Ht 5' 11"  (1.803 m)   ??? Wt 265 lb 1.6 oz (120.2 kg)   ??? SpO2 98%   ??? BMI 36.97 kg/m2     HEENT: No oral or pharyngeal masses, ulceration or thrush noted, no sinus tenderness.  Neck is supple with no thyromegaly or JVD noted.  Lymph Nodes: No lymphadenopathy noted in the occipital, pre and post auricular, cervical, supra and infraclavicular, axillary, epitrochlear, inguinal, and popliteal region.  Breasts: No palpable masses, nipple discharge or skin retraction  Lungs/Thorax: Clear to auscultation, no accessory muscles of respiration being used.  Heart: Regular rate and rhythm, normal S1, S2, no appreciable murmurs, rubs, gallops  Abdomen: Soft, nontender, bowel sounds present, no appreciable hepatosplenomegaly, no palpable masses   Extremeties: Good pulses bilaterally, no peripheral edema.  Skin: Normal skin tone with no rash, petechiae, ecchymosis noted.  Musculoskeletal: No pain on palpation over  bony prominence, no edema, no evidence of gout, no joint or bony deformity  Neurologic: Grossly intact        LABS/IMAGING:    Lab Results   Component Value Date/Time    WBC 6.3 01/17/2017 02:02 PM    HGB 15.0 01/17/2017 02:02 PM    HCT 41.5 01/17/2017 02:02 PM    PLATELET 239 01/17/2017 02:02 PM    MCV 86.1 01/17/2017 02:02 PM       Lab Results   Component Value Date/Time    Sodium 137 01/17/2017 02:02 PM    Potassium 4.5 01/17/2017 02:02 PM    Chloride 106 01/17/2017 02:02 PM    CO2 23 01/17/2017 02:02 PM    Anion gap 8 01/17/2017 02:02 PM    Glucose 83 01/17/2017 02:02 PM    BUN 26 (H) 01/17/2017 02:02 PM    Creatinine 1.52 (H) 01/17/2017 02:02 PM    BUN/Creatinine ratio 25 (H) 12/27/2016 11:38 AM    GFR est AA >60 01/17/2017 02:02 PM    GFR est non-AA 50 (L) 01/17/2017 02:02 PM    Calcium 9.1 01/17/2017 02:02 PM    AST (SGOT) 26 01/17/2017 02:02 PM    Alk. phosphatase 82 01/17/2017 02:02 PM    Protein, total 7.6 01/17/2017 02:02 PM    Albumin 4.0 01/17/2017 02:02 PM    Globulin 3.6 (H) 01/17/2017 02:02 PM    A-G Ratio 1.1 (L) 01/17/2017 02:02 PM    ALT (SGPT) 54 01/17/2017 02:02 PM       JAK2 testing:        Above results reviewed with patient.    ASSESSMENT:  89 male, non-smoker, commercial sign business owner, h/o obesity, b/l knee arthroscopy (6/16), vit D deficiency, GERD, diverticulitis, allergic rhinitis, lower back pain related to herniated disc 's with plans to undergo laminectomy & fusion. Surgery was deferred related to insurance approval however was also found to have polycythemia, and as such referred to [place]. Review of labs reveals Hgb 18-19 range since 9/13. Hct 55-61 range. Other cell lines are normal. More recently has had some increase in his Cr 1.4-1.6. Recently seen by pulmonology, and diagnosed w moderate sleep  apnea, and now on CPAP x 1 month, reports being regular w/ it as makes him sleep better. Notes chronic LE swelling x 3 yeas. Notes having had a detailed cardiac evaluation and was unremarkable.     Polycythemia is likely secondary related to his OSA and should improve w regular use of CPAP. On once daily baby aspirin (for multiple years). Will r/o primary marrow condition as follows however if secondary polycythemia, then no particular directed therapy will be needed unless Hct over 65%.     09/07/15: Hct stable around 60. Peripheral blood smear w normal morphology. Jak2 mutation analysis -ve. Epo high consistent w secondary polycythemia. Advised to continue CPAP and efforts directed at weight loss: Hct should improve over time. Continue daily aspirin. Hold phlebotomies unless hct increases over 65. Surgery end December. Will get CBC 1 week prior. Otherwise RTC in 3 months.     12/11/14: S/p back surgery w Dr Raul Del approx 5 weeks ago, recovering well w wound completely healed. Reports regular w CPAP. Back on daily baby aspirin. Hct better/improving. Lt leg swelling x 1 week: will get dopplers r/o DVT.     06/17: Hct improved. Regular w CPAP. On testosterone Q7days: likely contributing to polycythemia, advised to discuss w PCP and stop replacement if possible: pt willing.  12/17: Doing well. Remains on testosterone, per pt as felt will help w bone healing per neurosurgery. Will discuss w/ them next visit (in a months time) as surgery almost 1 year out. Will check Jak2 Exon 12,CALR, MPL, and CT CAP (hydration protocol) given hct remains high inspite of CPAP.     01/18: No new complaints. CT CAP without evidence of malignancy except noted liver lesion: will get MRI w/wo contrast to access. Now off testosterone, last injection 2 weeks ago: advised to stay off to see if improvement in polycythemia. Jak2 exon 12-14, CALR, and MPL -ve. Reports h/o below knee DVT 6/16 (was reportedly on testosterone back then also),  reports being treated w/ aspirin back then. Planning LT knee procedure in few day: advise enoxaparin 40 mg daily x 5 days post procedure as prophylaxis.     03/18: Doing well. Has lost weight 15 lbs since last visit by making dietary changes. Has remained off testosterone. Hgb/Hct normal now. S/p RT knee ACL surgery, did well w enoxaparin post-procedure x 5 days. MRI liver with hepatic steatosis otherwise unremarkable.      1. Secondary polycythemia (has OSA, improved w using CPAP and coming off testosterone)  2. Below knee DVT (post tibial and peroneal vein) 6/16 treated w aspirin (was on testosterone at time of thrombosis)    PLAN:  - As above.   - To stay off testosterone replacement. Serial monitoring of hct: phlebotomy only if hct over 65%  - OSA: on CPAP, f/u w pulmonology as needed  - Continue daily aspirin  - Increased creatinine: referred to nephro in past  - Pharmacologic and mechanical prophylaxis w any future planned hospitalization/surgical procedure.   - CT CAP 1/18 without malignancy but liver lesion noted: MRI liver 3/18 without liver lesion.     RTC in 4 months w labs, MD.     I spent over 20 minutes with the patient out of which more than 12 were spent in face to face counseling.    Letha Cape, MD  [facility]  [facility]  [address] [place] [address]  Office : [phone]  Fax : [phone]

## 2017-01-17 NOTE — Patient Instructions (Addendum)
Patient Instructions From Your Nurse    Reason for Visit:  Follow up    Plan:  Your labs are much better now that you are off the testosterone and have lost some weight!    The MRI did show some fatty deposits. Continue trying to keep your weight down.    Continue to stay on baby aspirin    Follow Up:  Follow up 4 months    Recent Lab Results:  Recent Results (from the past 12 hour(s))   CBC WITH AUTOMATED DIFF    Collection Time: 01/17/17  2:02 PM   Result Value Ref Range    WBC 6.3 4.3 - 11.1 K/uL    RBC 4.82 4.23 - 5.67 M/uL    HGB 15.0 13.6 - 17.2 g/dL    HCT 41.5 41.1 - 50.3 %    MCV 86.1 79.6 - 97.8 FL    MCH 31.1 26.1 - 32.9 PG    MCHC 36.1 (H) 31.4 - 35.0 g/dL    RDW 13.6 11.9 - 14.6 %    PLATELET 239 150 - 450 K/uL    MPV 9.9 (L) 10.8 - 14.1 FL    ABSOLUTE NRBC 0.00 0.0 - 0.2 K/uL    DF AUTOMATED      NEUTROPHILS 61 43 - 78 %    LYMPHOCYTES 29 13 - 44 %    MONOCYTES 9 4.0 - 12.0 %    EOSINOPHILS 1 0.5 - 7.8 %    BASOPHILS 1 0.0 - 2.0 %    ABS. NEUTROPHILS 3.9 1.7 - 8.2 K/UL    ABS. LYMPHOCYTES 1.8 0.5 - 4.6 K/UL    ABS. MONOCYTES 0.6 0.1 - 1.3 K/UL    ABS. EOSINOPHILS 0.1 0.0 - 0.8 K/UL    ABS. BASOPHILS 0.0 0.0 - 0.2 K/UL   METABOLIC PANEL, COMPREHENSIVE    Collection Time: 01/17/17  2:02 PM   Result Value Ref Range    Sodium 137 136 - 145 mmol/L    Potassium 4.5 3.5 - 5.1 mmol/L    Chloride 106 98 - 107 mmol/L    CO2 23 21 - 32 mmol/L    Anion gap 8 7 - 16 mmol/L    Glucose 83 65 - 100 mg/dL    BUN 26 (H) 6 - 23 MG/DL    Creatinine 1.52 (H) 0.8 - 1.5 MG/DL    GFR est AA >60 >60 ml/min/1.60m    GFR est non-AA 50 (L) >60 ml/min/1.779m   Calcium 9.1 8.3 - 10.4 MG/DL    Bilirubin, total 0.5 0.2 - 1.1 MG/DL    ALT (SGPT) 54 12 - 65 U/L    AST (SGOT) 26 15 - 37 U/L    Alk. phosphatase 82 50 - 136 U/L    Protein, total 7.6 6.3 - 8.2 g/dL    Albumin 4.0 3.5 - 5.0 g/dL    Globulin 3.6 (H) 2.3 - 3.5 g/dL    A-G Ratio 1.1 (L) 1.2 - 3.5         Care plan has been discussed and given to patient: n/a         -------------------------------------------------------------------------------------------------------------------  Please call our office at [phone] you have any  of the following symptoms:   ?? Fever of 100.5 or greater  ?? Chills  ?? Shortness of breath  ?? Swelling or pain in one leg    After office hours an answering service is available and will contact a provider for emergencies or if  you are experiencing any of the above symptoms.    ? Patient did express an interest in My Chart.  My Chart log in information explained on the after visit summary printout at the [place] desk.    Loel Lofty, RN

## 2017-01-18 LAB — TESTOSTERONE, FREE & TOTAL
Free testosterone (Direct): 6.7 pg/mL — ABNORMAL LOW (ref 7.2–24.0)
Testosterone: 89 ng/dL — ABNORMAL LOW (ref 264–916)

## 2017-01-23 ENCOUNTER — Ambulatory Visit: Attending: Otolaryngology/Facial Plastic Surgery | Primary: Family Medicine

## 2017-01-23 ENCOUNTER — Ambulatory Visit
Admit: 2017-01-23 | Discharge: 2017-01-23 | Payer: PRIVATE HEALTH INSURANCE | Attending: Otolaryngology/Facial Plastic Surgery | Primary: Family Medicine

## 2017-01-23 DIAGNOSIS — H903 Sensorineural hearing loss, bilateral: Secondary | ICD-10-CM

## 2017-01-23 NOTE — Progress Notes (Signed)
AUDIOLOGY EVALUATION    Barton DuboisMichael L Marik had Audiometry performed today due to patient's complaint of decreased hearing and vertigo    Results as follows:    Tympanometry: TYPE A bilaterally    Reflexes: Present at RE, no response at LE at 100 dB    Audiometry    Test Performed - Comprehensive Audiogram    Type of Loss - Right Ear: abnormal hearing: degree of loss is mild to moderate and type of loss is sensorineural                          Left Ear: abnormal hearing: degree of loss is mild to moderate and type of loss is sensorineural    SRT   Measurement Right Ear Left Ear   Value 15 15   Unit dB dB     Discrimination  Measurement Right Ear Left Ear   Value 96 % 96 %   Unit dB dB       Recommendations:    F/u with ENT    N. Page Pricilla Holmucker, AuD  Doctor of Audiology

## 2017-01-23 NOTE — Progress Notes (Signed)
HPI:  Steven [name] is a 61 y.o. male seen New Patient (Sinus infection, throat drainage, notes he had vertigo episodes three times, but it has since gone away.  Also notes clogged ears.  Has been on Amox 3 times in 4 months.).   He has had recurrent sinus infection for the past 4 months. He will get a sinus infections, see his PCP and get Amoxicillin and this will clear the infection but then it comes back a few weeks later.  He has gotten vertigo when the infections were going on.  This would last about 30 seconds.  This was usually after blowing the nose.  Then the ears would clog up and then the vertigo would start.  Today there is congestion it he nose, throat.  There is PND.  Heaviness in the chest.  The drainage is clear to yellow to green.  There is usually pressure in the frontal sinuses and pressure in the jaw.  He has seen the dentist and said his teeth were fine but the x ray showed a sinus infection.  He uses a CPAP.  He hasn't changed his hoses recently but has been cleaning them regularly.  He thinks he hears pretty well.  Competing noises bother him.  There is no hearing loss with the vertigo.      Past Medical History, Past Surgical History, Family history, Social History, and Medications were all reviewed with the patient today and updated as necessary.     Allergies   Allergen Reactions   ??? Nsaids (Non-Steroidal Anti-Inflammatory Drug) Other (comments)     Cause pedal edema if taken for a number of days in a row     Patient Active Problem List   Diagnosis Code   ??? Generalized anxiety disorder F41.1   ??? Testicular hypofunction E29.1   ??? Allergic rhinitis, cause unspecified J30.9   ??? Pre-diabetes R73.03   ??? Essential hypertension, benign I10   ??? Mood disorder (HCC) F39   ??? Venous insufficiency of both lower extremities I87.2   ??? Chronic left sacroiliac joint pain M53.3, G89.29   ??? Chronic radicular low back pain M54.16, G89.29   ??? OSA (obstructive sleep apnea) G47.33    ??? Spondylolisthesis at L4-L5 level M43.16   ??? Lumbar stenosis with neurogenic claudication M48.062   ??? GERD (gastroesophageal reflux disease) K21.9   ??? Hypercholesterolemia E78.00   ??? Morbid obesity (HCC) E66.01   ??? Complete tear of right ACL S83.511A   ??? Tear of lateral meniscus of right knee S83.281A   ??? Osteoarthritis of right knee M17.11     Current Outpatient Prescriptions   Medication Sig   ??? valsartan-hydroCHLOROthiazide (DIOVAN-HCT) 320-12.5 mg per tablet TAKE 1 TABLET BY MOUTH EVERY DAY   ??? pravastatin (PRAVACHOL) 40 mg tablet TAKE 1 TABLET BY MOUTH AT BEDTIME   ??? metoprolol succinate (TOPROL-XL) 100 mg tablet Take 1 Tab by mouth nightly. Indications: hypertension   ??? metFORMIN (GLUCOPHAGE) 1,000 mg tablet TAKE ONE TABLET BY MOUTH TWICE A DAY   ??? OMEPRAZOLE PO Take  by mouth as needed.   ??? diclofenac EC (VOLTAREN) 50 mg EC tablet Take 1 Tab by mouth two (2) times a day. (Patient taking differently: Take 50 mg by mouth as needed.)   ??? aspirin (ASPIRIN LOW-STRENGTH) 81 mg chewable tablet Take 81 mg by mouth every morning. Indications: continue   ??? MULTI-VITAMIN HI-PO PO Take 2 Tabs by mouth every morning. Indications: hold until after surgery   ??? sildenafil  citrate (VIAGRA) 100 mg tablet Take 1 Tab by mouth as needed.     No current facility-administered medications for this visit.      Past Medical History:   Diagnosis Date   ??? Allergic rhinitis, cause unspecified    ??? BMI 40.0-44.9, adult (HCC)    ??? Chronic left sacroiliac joint pain    ??? Chronic radicular low back pain         ??? Diverticulitis    ??? Essential hypertension, benign    ??? Generalized anxiety disorder    ??? GERD (gastroesophageal reflux disease)     occ no meds   ??? Hypercholesterolemia    ??? Left lumbar radiculitis     He has had low back pain that radiates down his LLE for years.   ??? 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 Mood disorder (HCC) 07/14/2014   ??? Morbid obesity (HCC)     bmi = 40   ??? OSA on CPAP     wears cpap at hs    ??? Polycythemia dx--06/2015    followed by Dr Rose Fillers (heme-onc); "Advised to consider stopping testosterone replacement. Serial monitoring of hct: phlebotomy only if hct over 65%" per office note 10/10/16. Hgb 18-19 since 06/2012.   ??? Prediabetes     does not check BG at home, metformin daily, last hgba1c- 6.5 (05/2016)   ??? Spondylolisthesis at L4-L5 level 10/19/2015   ??? Testicular hypofunction    ??? Thrombophlebitis of left leg (HCC) 04/2015    posterior tibial vein and peroneal vein   ??? Venous insufficiency of both lower extremities 07/14/2014   ??? Vitamin D deficiency           Social History   Substance Use Topics   ??? Smoking status: Never Smoker   ??? Smokeless tobacco: Never Used   ??? Alcohol use 0.6 oz/week     1 Cans of beer per week      Comment: social     Past Surgical History:   Procedure Laterality Date   ??? ENDOSCOPY, COLON, DIAGNOSTIC  2011    Diverticulosis   ??? HX COLONOSCOPY  05/2015    Diverticulosis   ??? HX KNEE ARTHROSCOPY Bilateral 04/2015    Partial medial and lateral meniscectomies by Dr. Benjamine Mola   ??? HX ORTHOPAEDIC Left 10/19/2015    L4-5 TLIF, decompression. Dr. Dorene Grebe     Family History   Problem Relation Age of Onset   ??? Diabetes Mother    ??? Alzheimer Mother    ??? Heart Disease Father      atherosclerosis (heavy smoker)   ??? Diabetes Sister         ROS:    Review of Systems   Constitutional: Negative.    HENT: Positive for congestion, hearing loss, sinus pain, sore throat and tinnitus.    Eyes: Negative.    Respiratory: Positive for cough.    Cardiovascular: Negative.    Gastrointestinal: Negative.    Genitourinary: Negative.    Skin: Negative.    Neurological: Negative.    Endo/Heme/Allergies: Negative.           PHYSICAL EXAM:    Visit Vitals   ??? BP 125/65   ??? Ht 5\' 11"  (1.803 m)   ??? Wt 261 lb (118.4 kg)   ??? BMI 36.4 kg/m2       Head  Head and Face - The head and face are atraumatic, normocephalic.  The salivary glands are intact and the facial  appearance is symmetric.     Head shape - No scars, lesions, or masses    Ear  Ear - Tympanic membranes are clear, the external auditory canal is without discharge and the tympanic membranes are mobile.  There is no tympanic membrane erythema and no middle ear opacity is visualized.    Pinna: bilateral - No hematomas or lacerations    Eye  Eyeball - bilateral - extraocular motions intact, equal in size and movement    Nose and Sinuses  Nose - mucosa is pink and the septum is midline.  There are no nasal lesions and there was no turbinate hypertrophy.    Mouth and Throat  Lips - upper lip - normal: no dryness, cracking, pallor, cyanosis, or vesicular eruption.  Lower lip: normal: no dryness, cracking, pallor, cyanosis, or vesicular eruption.     Teeth and Gums - No bleeding, no inflammation or ulceration.    Lips - Pink and symmetrical  Oral Cavity - Oral mucosa pink, soft and hard palates contiguous and tongue moist without ulcers.  The mucosa is without ulcerations. No oral cavity masses present.   Parotid Gland - Bilateral - Non tender, not swollen.  Oropharynx - No discharge or Erythema  Nasopharynx - Non obstructed, mucosa pink and moist.    Hypopharynx - No erythema  Submandibular Gland - Non tender, not swollen.    Tonsils - Normal    Neck   Neck - Full range of motion and Supple.  Non Tender.   No Masses.    Trachea - Midline.  Thyroid - Gland - Symmetric.  Non Tender.  Nodules - No nodules.    Neurologic - II - XII Grossly intact bilaterally    Cardiac  Inspection - Jugular Vein:  Bilateral - non distended, no prominent pulsations    Chest and Lung  Inspection - Movements:  Chest symmetrical with bilateral expansion, respirations even and non labored      ASSESSMENT and PLAN      ICD-10-CM ICD-9-CM    1. Sensorineural hearing loss, bilateral H90.3 389.18 COMPREHENSIVE HEARING TEST      TYMPANOMETRY AND REFLEX THRESHOLD MEASUREMENTS   2. Chronic sinusitis, unspecified location J32.9 473.9 CT MAXILLOFACIAL WO CONT    3. Deviated nasal septum J34.2 470    4. Nasal turbinate hypertrophy J34.3 478.0    5. Nasal obstruction J34.89 478.19    6. 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 ETD (Eustachian tube dysfunction), bilateral H69.83 381.81        Pt counseled.  I will put him on an antibiotic to clear the current infection.  I will check a CT of the sinuses about a week later.  He will follow up the following week.  I do think this is all related together but there could be separate issues.  I will consider balance testing.    Mosetta Putthomas S Jennica Tagliaferri, DO       01/23/2017

## 2017-01-24 MED ORDER — LEVOFLOXACIN 750 MG TAB
750 mg | ORAL_TABLET | Freq: Every day | ORAL | 0 refills | Status: AC
Start: 2017-01-24 — End: 2017-01-31

## 2017-01-24 NOTE — Addendum Note (Signed)
Addended by: Sharyn Creamer on: 01/24/2017 03:44 PM      Modules accepted: Orders

## 2017-01-30 DIAGNOSIS — M25561 Pain in right knee: Secondary | ICD-10-CM

## 2017-01-30 NOTE — Progress Notes (Signed)
Therapy Center at [facility] [address]  [address] [place], [place] [address]  Phone: [phone]   Fax: [phone]    OUTPATIENT DAILY NOTE    NAME/AGE/GENDER: Steven [name] is a 62 y.o. male.     DATE: 01/30/2017    Patient did not show for his appointment for today due to unknown reasons.  Will plan to follow up on next scheduled visit.    Allene Dillon, PT

## 2017-01-31 ENCOUNTER — Inpatient Hospital Stay: Payer: PRIVATE HEALTH INSURANCE | Primary: Family Medicine

## 2017-02-01 ENCOUNTER — Ambulatory Visit: Admit: 2017-02-01 | Discharge: 2017-02-01 | Payer: PRIVATE HEALTH INSURANCE | Primary: Family Medicine

## 2017-02-01 ENCOUNTER — Encounter: Payer: PRIVATE HEALTH INSURANCE | Primary: Family Medicine

## 2017-02-01 DIAGNOSIS — G4733 Obstructive sleep apnea (adult) (pediatric): Secondary | ICD-10-CM

## 2017-02-01 MED ORDER — DOXYCYCLINE 100 MG TAB
100 mg | ORAL_TABLET | ORAL | 0 refills | Status: DC
Start: 2017-02-01 — End: 2017-05-15

## 2017-02-01 NOTE — Telephone Encounter (Signed)
The patient is seen by lacey burke NP today and she is ordering doxycycline  bid x 10 days.

## 2017-02-01 NOTE — Progress Notes (Signed)
.    [facility]  [address]  [place], [place] [address]  [phone]    Patient Name: Steven [name]  Date of Birth: June 15, 1956    Cpap Clinic Visit:  The patient is being seen today in the Cpap Clinic. He is prescribed cpap therapy with a humidifier set at 10cm with a nasal mask. The AHI is 0.3, leak 25 and the hourly usage is 8.19 nightly. The overall use is 2977 hours with days greater than four hours at 358/365. The patient is compliant with and benefiting from the pap therapy. The patient had some concerns for the NP today so Lance Sell NP addressed his issues.    Physical Exam:    Visit Vitals   ??? BP 130/74   ??? Pulse 78   ??? Resp 14   ??? Ht  (1.803 m)   ??? Wt 266 lb (120.7 kg)   ??? SpO2 97%   ??? BMI 37.1 kg/m2       Encounter Diagnosis   Name Primary?   ??? OSA (obstructive sleep apnea) Yes         Patient Allergies:  Nsaids (non-steroidal anti-inflammatory drug)     Current Outpatient Prescriptions   Medication Sig Dispense Refill   ??? valsartan-hydroCHLOROthiazide (DIOVAN-HCT) 320-12.5 mg per tablet TAKE 1 TABLET BY MOUTH EVERY DAY 30 Tab 5   ??? pravastatin (PRAVACHOL) 40 mg tablet TAKE 1 TABLET BY MOUTH AT BEDTIME 30 Tab 5   ??? metoprolol succinate (TOPROL-XL) 100 mg tablet Take 1 Tab by mouth nightly. Indications: hypertension 30 Tab 5   ??? metFORMIN (GLUCOPHAGE) 1,000 mg tablet TAKE ONE TABLET BY MOUTH TWICE A DAY 60 Tab 5   ??? sildenafil citrate (VIAGRA) 100 mg tablet Take 1 Tab by mouth as needed. 12 Tab 5   ??? OMEPRAZOLE PO Take  by mouth as needed.     ??? diclofenac EC (VOLTAREN) 50 mg EC tablet Take 1 Tab by mouth two (2) times a day. (Patient taking differently: Take 50 mg by mouth as needed.) 180 Tab 2   ??? aspirin (ASPIRIN LOW-STRENGTH) 81 mg chewable tablet Take 81 mg by mouth every morning. Indications: continue     ??? MULTI-VITAMIN HI-PO PO Take 2 Tabs by mouth every morning. Indications: hold until after surgery          Plan:   The patient will continue to follow the prescribed therapy of 10cm and follow up in the sleep center in one year. The patient will follow up with their DME company as needed for supplies.    Supervising Physician:Dr. Tamsen Roers

## 2017-02-05 ENCOUNTER — Inpatient Hospital Stay: Admit: 2017-02-05 | Payer: PRIVATE HEALTH INSURANCE | Primary: Family Medicine

## 2017-02-05 NOTE — Progress Notes (Signed)
Oletta Lamas  DOB: 1956-08-03  Primary: [name] [employer] [facility]*  Secondary:  [place] [facility]  [address] [place] [address], [place]  [phone]   [phone]          OUTPATIENT PHYSICAL THERAPY:Progress Report 02/05/2017    ICD-10: Treatment Diagnosis:   ?? Pain in right knee (M25.561)  ?? Stiffness of right knee, not elsewhere classified (M25.661)  Precautions/Allergies:   Nsaids (non-steroidal anti-inflammatory drug)   Fall Risk Score: 1 (? 5 = High Risk)  MD Orders: Evaluate and Treat MEDICAL/REFERRING DIAGNOSIS:  S/p right knee scope; ACL reconstruction utilizing allograft  DATE OF ONSET: Surgery: 11/23/2016  REFERRING PHYSICIAN: Maryla Morrow., MD  RETURN PHYSICIAN APPOINTMENT: 12/05/2016     ASSESSMENT:  Mr. Lariccia presents with decreased mobility, decreased strength, and pain in right knee secondary to ACL repair.  Patient has attended a total of 9 scheduled physical therapy visits including initial evaluation on 11/28/2016.  Treatment has consisted of strengthening, stretching, manual therapy, and modalities to improve overall pain, mobility, and performance with activities of daily living.       PROBLEM LIST (Impacting functional limitations):  1. Decreased Strength  2. Decreased ADL/Functional Activities  3. Decreased Ambulation Ability/Technique  4. Decreased Balance  5. Increased Pain  6. Decreased Activity Tolerance INTERVENTIONS PLANNED:  1. Balance Exercise  2. Cold  3. Gait Training  4. Heat  5. Home Exercise Program (HEP)  6. Manual Therapy  7. Neuromuscular Re-education/Strengthening  8. Range of Motion (ROM)  9. Therapeutic Activites  10. Therapeutic Exercise/Strengthening   TREATMENT PLAN:  Effective Dates: 11/28/2016 TO 02/25/2017.  Frequency/Duration: 2 times a week for 8 weeks  GOALS: (Goals have been discussed and agreed upon with patient.)  Short-Term Functional Goals: Time Frame: 3 weeks   1. Patient will be independent with home exercise program without exacerbation of symptoms or cueing needed--goal met.   2. Patient will be independent with correct sleeping positions and awareness/avoidance of aggravating positions without cueing needed--goal met.   Discharge Goals: Time Frame: 12 weeks  1. Patient will be independent with all ADLs with minimal onset of right knee pain and no deficits with daily tasks--goal ongoing.  2. Patient will report no fear avoidance with social or recreational activities due to right knee pain--goal ongoing.  3. Patient will score less than or equal to 50/80 on Lower Extremity Functional Scale with minimal effect of right knee pain on patient's ability to manage every day life activities--goal ongoing.  Rehabilitation Potential For Stated Goals: Good            The information in this section was collected on 12/26/2016 (except where otherwise noted).  HISTORY:   History of Present Injury/Illness (Reason for Referral):  Patient reports that he had surgery on Friday (12/24/2016).  He reports he has been doing well. He reports his pain is under control.  He reports he is ready to get back to walking and moving without fear of pain.  Past Medical History/Comorbidities:   Mr. Schiff  has a past medical history of Allergic rhinitis, cause unspecified; BMI 40.0-44.9, adult ([place]); Chronic left sacroiliac joint pain; Chronic radicular low back pain; Diverticulitis; Essential hypertension, benign; Generalized anxiety disorder; GERD (gastroesophageal reflux disease); Hypercholesterolemia; Left lumbar radiculitis; Lumbar stenosis with neurogenic claudication (11/06/2015); Mood disorder ([place]) (07/14/2014); Morbid obesity ([place]); OSA on CPAP; Polycythemia (dx--06/2015); Prediabetes; Spondylolisthesis at L4-L5 level (10/19/2015); Testicular hypofunction; Thrombophlebitis of left leg ([place]) (04/2015); Venous insufficiency of  both lower extremities (07/14/2014); and Vitamin D  deficiency. He also has no past medical history of Adverse effect of anesthesia; Difficult intubation; Malignant hyperthermia due to anesthesia; Nausea & vomiting; or Pseudocholinesterase deficiency.  Mr. Loera  has a past surgical history that includes endoscopy, colon, diagnostic (2011); hx colonoscopy (05/2015); hx knee arthroscopy (Bilateral, 04/2015); and hx orthopaedic (Left, 10/19/2015).  Social History/Living Environment:   Home Environment: Private residence  Living Alone: No  Support Systems: Family member(s), Spouse/Significant Other/Partner  Prior Level of Function/Work/Activity:  Independent  Dominant Side:         RIGHT  Personal Factors:          Sex:  male        Age:  61 y.o.  Current Medications:       Current Outpatient Prescriptions:   ???  doxycycline (ADOXA) 100 mg tablet, Take one table bid for 10 days, Disp: 20 Tab, Rfl: 0  ???  valsartan-hydroCHLOROthiazide (DIOVAN-HCT) 320-12.5 mg per tablet, TAKE 1 TABLET BY MOUTH EVERY DAY, Disp: 30 Tab, Rfl: 5  ???  pravastatin (PRAVACHOL) 40 mg tablet, TAKE 1 TABLET BY MOUTH AT BEDTIME, Disp: 30 Tab, Rfl: 5  ???  metoprolol succinate (TOPROL-XL) 100 mg tablet, Take 1 Tab by mouth nightly. Indications: hypertension, Disp: 30 Tab, Rfl: 5  ???  metFORMIN (GLUCOPHAGE) 1,000 mg tablet, TAKE ONE TABLET BY MOUTH TWICE A DAY, Disp: 60 Tab, Rfl: 5  ???  sildenafil citrate (VIAGRA) 100 mg tablet, Take 1 Tab by mouth as needed., Disp: 12 Tab, Rfl: 5  ???  OMEPRAZOLE PO, Take  by mouth as needed., Disp: , Rfl:   ???  diclofenac EC (VOLTAREN) 50 mg EC tablet, Take 1 Tab by mouth two (2) times a day. (Patient taking differently: Take 50 mg by mouth as needed.), Disp: 180 Tab, Rfl: 2  ???  aspirin (ASPIRIN LOW-STRENGTH) 81 mg chewable tablet, Take 81 mg by mouth every morning. Indications: continue, Disp: , Rfl:   ???  MULTI-VITAMIN HI-PO PO, Take 2 Tabs by mouth every morning. Indications: hold until after surgery, Disp: , Rfl:    Date Last Reviewed:  02/05/2017     Number of Personal Factors/Comorbidities that affect the Plan of Care: 1-2: MODERATE COMPLEXITY   EXAMINATION:   Observation/Orthostatic Postural Assessment:          Posture Assessment: Rounded shoulders, Forward head   Palpation:          Swelling noted in calf region with mild bruising noted behind right knee.  Scars and stitches are intact.    ROM:          R LE Assessment (AROM):   ?? Hip Flexion: 80 degrees    ?? Hip Extension: 0 degrees    ?? Hip Abduction: 10 degrees    ?? Hip Adduction: 0 degrees    ?? Knee Flexion: 85 degrees    ?? Knee Extension: 10 degrees     Strength:          R LE Assessment (Strength):   ?? Hip Flexion: 3+/5 with manual muscle testing    ?? Hip Extension: 3+/5 with manual muscle testing    ?? Hip Abduction: 3+/5 with manual muscle testing    ?? Hip Adduction: 3+/5 with manual muscle testing    ?? Knee Flexion: 3/5 with manual muscle testing    ?? Knee Extension: 3/5 with manual muscle testing     Special Tests:  Not tested    Neurological Screen:        Dermatomes:  Within normal limits        Reflexes:  2+    Functional Mobility:         Gait/Ambulation:     Base of Support: Center of gravity altered  Speed/Cadence: Pace decreased (<100 feet/min)  Step Length: Left shortened, Right lengthened  Swing Pattern: Left asymmetrical, Right symmetrical  Stance: Left decreased, Right increased  Gait Abnormalities: Antalgic  Ambulation - Level of Assistance: Modified independent  Assistive Device: Walker, rolling  ?? Interventions: Verbal cues, Safety awareness training          Transfers:     Sit to Stand: Modified independent  Stand to Sit: Modified independent  Stand Pivot Transfers: Modified independent  Bed to Chair: Modified independent  ?? Lateral Transfers: Modified independent          Bed Mobility:     Rolling: Independent  Supine to Sit: Independent  Sit to Supine: Independent  Scooting: Independent     Balance:          Intact   Body Structures Involved:  1. Joints   2. Muscles Body Functions Affected:  1. Neuromusculoskeletal  2. Movement Related Activities and Participation Affected:  1. Mobility  2. Self Care   Number of elements (examined above) that affect the Plan of Care: 4+: HIGH COMPLEXITY   CLINICAL PRESENTATION:   Presentation: Evolving clinical presentation with changing clinical characteristics: MODERATE COMPLEXITY   CLINICAL DECISION MAKING:   Outcome Measure:   Tool Used: Lower Extremity Functional Scale (LEFS)  Score:  Initial: 17/80 Most Recent: 25/80 (Date: 12/26/2016 )   Interpretation of Score: 20 questions each scored on a 5 point scale with 0 representing "extreme difficulty or unable to perform" and 4 representing "no difficulty".  The lower the score, the greater the functional disability. 80/80 represents no disability.  Minimal detectable change is 9 points.  Score 80 79-63 62-48 47-32 31-16 15-1 0   Modifier CH CI CJ CK CL CM CN     Medical Necessity:   ?? Patient is expected to demonstrate progress in strength, range of motion, balance and coordination to increase independence with daily activities.  ?? Patient demonstrates good rehab potential due to higher previous functional level.  ?? Skilled intervention continues to be required due to decreased mobility.  Reason for Services/Other Comments:  ?? Patient continues to demonstrate capacity to improve overall mobility which will increase independence and increase safety.   Use of outcome tool(s) and clinical judgement create a POC that gives a: Questionable prediction of patient's progress: MODERATE COMPLEXITY            TREATMENT:   (In addition to Assessment/Re-Assessment sessions the following treatments were rendered)  Pre-treatment Symptoms/Complaints:  02/05/2017:  Patient reports he has been doing well. He reports he has been walking more with his brace.  Pain: Initial:   Pain Intensity 1: 5  Pain Location 1: Knee  Pain Orientation 1: Right   Pain Intervention(s) 1: Rest, Medication (see MAR)  Post Session:  4/10     THERAPEUTIC EXERCISE: (35 minutes):  Exercises per grid below to improve mobility, strength, balance and coordination.  Required minimal verbal and manual cues to promote proper body alignment, promote proper body posture and promote proper body mechanics.  Progressed resistance, range, repetitions and complexity of movement as indicated.   Date:  02/05/2017   Activity/Exercise Parameters   A[place]yne 7 minutes  Seat #7   Calf stretch on slant board 10 reps  5 second hold   Plantar stretch off edge of step 10 reps  5 second hold   Walking backward on treadmill 5 minutes  1.0-1.2 mph   Nautilus leg press 35 pounds  20 reps      MANUAL THERAPY: (10 minutes): Joint mobilization and patellar mobilization was utilized and necessary because of the patient's restricted joint motion, painful spasm, loss of articular motion and restricted motion of soft tissue.      [facility] Portal    Treatment/Session Assessment:    ?? Response to Treatment: Patient tolerated treatment with decreased pain/stiffness after treatment.  Continue plan of care.    ?? Compliance with Program/Exercises: Compliant  ?? Recommendations/Intent for next treatment session: "Next visit will focus on advancements to more challenging activities".  Total Treatment Duration:  PT Patient Time In/Time Out  Time In: 1030  Time Out: Alsey, PT

## 2017-02-12 ENCOUNTER — Inpatient Hospital Stay: Admit: 2017-02-12 | Payer: PRIVATE HEALTH INSURANCE | Primary: Family Medicine
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
## 2017-02-13 ENCOUNTER — Inpatient Hospital Stay
Admit: 2017-02-13 | Payer: PRIVATE HEALTH INSURANCE | Attending: Otolaryngology/Facial Plastic Surgery | Primary: Family Medicine

## 2017-02-13 DIAGNOSIS — J320 Chronic maxillary sinusitis: Secondary | ICD-10-CM

## 2017-02-15 ENCOUNTER — Encounter: Payer: PRIVATE HEALTH INSURANCE | Primary: Family Medicine

## 2017-02-19 NOTE — Progress Notes (Signed)
[facility]  [address] [place], [place] [address]  Phone: [phone]   Fax: [phone]    OUTPATIENT DAILY NOTE    NAME/AGE/GENDER: [name] is a 61 y.o. male.     DATE: 02/19/2017    Patient cancelled his appointment for today due to having pulled a muscle in his back and is not able to walk.  Will plan to follow up on next scheduled visit.    Allene Dillon, PT

## 2017-02-20 ENCOUNTER — Inpatient Hospital Stay: Payer: PRIVATE HEALTH INSURANCE | Primary: Family Medicine

## 2017-02-20 ENCOUNTER — Ambulatory Visit
Admit: 2017-02-20 | Discharge: 2017-02-20 | Payer: PRIVATE HEALTH INSURANCE | Attending: Otolaryngology/Facial Plastic Surgery | Primary: Family Medicine

## 2017-02-20 DIAGNOSIS — J343 Hypertrophy of nasal turbinates: Secondary | ICD-10-CM

## 2017-02-20 NOTE — Progress Notes (Signed)
HPI:  Steven [name] is a 61 y.o. male seen for follow up on Results (review CT scan results). The CT showed turbinate hypertrophy, bilateral maxillary mucosal thickening, bilateral ethmoid mucosal thickening, bilateral frontal sinus mucosal thickening.   He hasn't had a sinus infection.  There is still a cough, PND.  There is no bleeding, no purulence from the nose or throat.   He feels overall he is doing about the same.  He has been on nasal steroid sprays and ant histamines.      Past Medical History, Past Surgical History, Family history, Social History, and Medications were all reviewed with the patient today and updated as necessary.     Allergies   Allergen Reactions   ??? Nsaids (Non-Steroidal Anti-Inflammatory Drug) Other (comments)     Cause pedal edema if taken for a number of days in a row     Patient Active Problem List   Diagnosis Code   ??? Generalized anxiety disorder F41.1   ??? Testicular hypofunction E29.1   ??? Allergic rhinitis, cause unspecified J30.9   ??? Pre-diabetes R73.03   ??? Essential hypertension, benign I10   ??? Mood disorder (HCC) F39   ??? Venous insufficiency of both lower extremities I87.2   ??? Chronic left sacroiliac joint pain M53.3, G89.29   ??? Chronic radicular low back pain M54.16, G89.29   ??? OSA (obstructive sleep apnea) G47.33   ??? Spondylolisthesis at L4-L5 level M43.16   ??? Lumbar stenosis with neurogenic claudication M48.062   ??? GERD (gastroesophageal reflux disease) K21.9   ??? Hypercholesterolemia E78.00   ??? Morbid obesity (HCC) E66.01   ??? Complete tear of right ACL S83.511A   ??? Tear of lateral meniscus of right knee S83.281A   ??? Osteoarthritis of right knee M17.11     Current Outpatient Prescriptions   Medication Sig   ??? doxycycline (ADOXA) 100 mg tablet Take one table bid for 10 days   ??? valsartan-hydroCHLOROthiazide (DIOVAN-HCT) 320-12.5 mg per tablet TAKE 1 TABLET BY MOUTH EVERY DAY   ??? pravastatin (PRAVACHOL) 40 mg tablet TAKE 1 TABLET BY MOUTH AT BEDTIME    ??? metoprolol succinate (TOPROL-XL) 100 mg tablet Take 1 Tab by mouth nightly. Indications: hypertension   ??? metFORMIN (GLUCOPHAGE) 1,000 mg tablet TAKE ONE TABLET BY MOUTH TWICE A DAY   ??? sildenafil citrate (VIAGRA) 100 mg tablet Take 1 Tab by mouth as needed.   ??? OMEPRAZOLE PO Take  by mouth as needed.   ??? diclofenac EC (VOLTAREN) 50 mg EC tablet Take 1 Tab by mouth two (2) times a day. (Patient taking differently: Take 50 mg by mouth as needed.)   ??? aspirin (ASPIRIN LOW-STRENGTH) 81 mg chewable tablet Take 81 mg by mouth every morning. Indications: continue   ??? MULTI-VITAMIN HI-PO PO Take 2 Tabs by mouth every morning. Indications: hold until after surgery     No current facility-administered medications for this visit.      Past Medical History:   Diagnosis Date   ??? Allergic rhinitis, cause unspecified    ??? BMI 40.0-44.9, adult (HCC)    ??? Chronic left sacroiliac joint pain    ??? Chronic radicular low back pain         ??? Diverticulitis    ??? Essential hypertension, benign    ??? Generalized anxiety disorder    ??? GERD (gastroesophageal reflux disease)     occ no meds   ??? Hypercholesterolemia    ??? Left lumbar radiculitis  He has had low back pain that radiates down his LLE for years.   ??? 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 Mood disorder (HCC) 07/14/2014   ??? Morbid obesity (HCC)     bmi = 40   ??? OSA on CPAP     wears cpap at hs   ??? Polycythemia dx--06/2015    followed by Dr Rose Fillers (heme-onc); "Advised to consider stopping testosterone replacement. Serial monitoring of hct: phlebotomy only if hct over 65%" per office note 10/10/16. Hgb 18-19 since 06/2012.   ??? Prediabetes     does not check BG at home, metformin daily, last hgba1c- 6.5 (05/2016)   ??? Spondylolisthesis at L4-L5 level 10/19/2015   ??? Testicular hypofunction    ??? Thrombophlebitis of left leg (HCC) 04/2015    posterior tibial vein and peroneal vein   ??? Venous insufficiency of both lower extremities 07/14/2014   ??? Vitamin D deficiency            Social History   Substance Use Topics   ??? Smoking status: Never Smoker   ??? Smokeless tobacco: Never Used   ??? Alcohol use 0.6 oz/week     1 Cans of beer per week      Comment: social     Past Surgical History:   Procedure Laterality Date   ??? ENDOSCOPY, COLON, DIAGNOSTIC  2011    Diverticulosis   ??? HX COLONOSCOPY  05/2015    Diverticulosis   ??? HX KNEE ARTHROSCOPY Bilateral 04/2015    Partial medial and lateral meniscectomies by Dr. Benjamine Mola   ??? HX ORTHOPAEDIC Left 10/19/2015    L4-5 TLIF, decompression. Dr. Dorene Grebe     Family History   Problem Relation Age of Onset   ??? Diabetes Mother    ??? Alzheimer Mother    ??? Heart Disease Father      atherosclerosis (heavy smoker)   ??? Diabetes Sister         ROS:    Review of Systems   Constitutional: Negative for fever.   HENT: Negative for congestion, ear pain, hearing loss and tinnitus.    Eyes: Negative for double vision.   Respiratory: Negative for cough.    Cardiovascular: Negative for chest pain.   Gastrointestinal: Negative for heartburn.   Neurological: Negative for dizziness and headaches.   Psychiatric/Behavioral: Negative for depression.          PHYSICAL EXAM:    Visit Vitals   ??? BP 128/78   ??? Ht  (1.803 m)   ??? Wt 266 lb (120.7 kg)   ??? BMI 37.1 kg/m2       Head  Head and Face - The head and face are atraumatic, normocephalic.  The salivary glands are intact and the facial appearance is symmetric.    Head shape - No scars, lesions, or masses    Ear  Ear - Tympanic membranes are clear, the external auditory canal is without discharge and the tympanic membranes are mobile.  There is no tympanic membrane erythema and no middle ear opacity is visualized.    Pinna: bilateral - No hematomas or lacerations    Eye  Eyeball - bilateral - extraocular motions intact, equal in size and movement    Nose and Sinuses  Nose - mucosa is pink and the septum is deviated.  There are no nasal lesions and there was mod/severe turbinate hypertrophy.    Mouth and Throat   Lips - upper lip - normal: no dryness,  cracking, pallor, cyanosis, or vesicular eruption.  Lower lip: normal: no dryness, cracking, pallor, cyanosis, or vesicular eruption.     Teeth and Gums - No bleeding, no inflammation or ulceration.    Lips - Pink and symmetrical  Oral Cavity - Oral mucosa pink, soft and hard palates contiguous and tongue moist without ulcers.  The mucosa is without ulcerations. No oral cavity masses present.   Parotid Gland - Bilateral - Non tender, not swollen.  Oropharynx - No discharge or Erythema  Nasopharynx - Non obstructed, mucosa pink and moist.    Hypopharynx - No erythema  Submandibular Gland - Non tender, not swollen.    Tonsils - Normal    Neck   Neck - Full range of motion and Supple.  Non Tender.   No Masses.    Trachea - Midline.  Thyroid - Gland - Symmetric.  Non Tender.  Nodules - No nodules.    Neurologic - II - XII Grossly intact bilaterally    Cardiac  Inspection - Jugular Vein:  Bilateral - non distended, no prominent pulsations    Chest and Lung  Inspection - Movements:  Chest symmetrical with bilateral expansion, respirations even and non labored      ASSESSMENT and PLAN      ICD-10-CM ICD-9-CM    1. Nasal turbinate hypertrophy J34.3 478.0    2. Nasal obstruction J34.89 478.19    3. Chronic sinusitis, unspecified location J32.9 473.9    4. Deviated nasal septum J34.2 470        Pt counseled.  He has got new equipment for his CPAP and we will try this first.  If he isn't getting improvement and is still getting recurrent infections, I will recommend that he undergo a bilateral maxillary and frontal balloon sinuplasty with possible biopsy and a turbinate reduction.  The procedure, risks and benefits were discussed with him today and he will think about the surgery.  All questions answered.      Mosetta Putt, DO  02/20/2017

## 2017-02-22 ENCOUNTER — Inpatient Hospital Stay: Admit: 2017-02-22 | Payer: PRIVATE HEALTH INSURANCE | Primary: Family Medicine

## 2017-02-22 NOTE — Other (Addendum)
Steven [name]  DOB: 1956/07/03  Primary: Steven [name] [employer] [place]*  Secondary:  [place] at [facility]  [address], [place]  Phone:[phone]   [phone]          OUTPATIENT PHYSICAL [place] 10/31/7251    ICD-10: Treatment Diagnosis:   ?? Pain in right knee (M25.561)  ?? Stiffness of right knee, not elsewhere classified (M25.661)  Precautions/Allergies:   Nsaids (non-steroidal anti-inflammatory drug)   Fall Risk Score: 1 (? 5 = High Risk)  MD Orders: Evaluate and Treat MEDICAL/REFERRING DIAGNOSIS:  S/p right knee scope; ACL reconstruction utilizing allograft  DATE OF ONSET: Surgery: 11/23/2016  REFERRING PHYSICIAN: Maryla Morrow., MD  RETURN PHYSICIAN APPOINTMENT: 12/05/2016     ASSESSMENT:  Mr. See presents with decreased mobility, decreased strength, and pain in right knee secondary to ACL repair.  Patient has attended a total of 11 scheduled physical therapy visits including initial evaluation on 11/28/2016.  Treatment has consisted of strengthening, stretching, manual therapy, and modalities to improve overall pain, mobility, and performance with activities of daily living.    After discussing with patient he agreed he would continue to benefit from physical therapy to improve overall strength and mobility in right knee.  Please sign this re-certification if you concur.  Thank you for the opportunity to serve this patient.    PROBLEM LIST (Impacting functional limitations):  1. Decreased Strength  2. Decreased ADL/Functional Activities  3. Decreased Ambulation Ability/Technique  4. Decreased Balance  5. Increased Pain  6. Decreased Activity Tolerance INTERVENTIONS PLANNED:  1. Balance Exercise  2. Cold  3. Gait Training  4. Heat  5. Home Exercise Program (HEP)  6. Manual Therapy  7. Neuromuscular Re-education/Strengthening  8. Range of Motion (ROM)  9. Therapeutic Activites  10. Therapeutic Exercise/Strengthening   TREATMENT PLAN:   Effective Dates: 02/25/2017 TO 04/27/2017.  Frequency/Duration: 2 times a week for 8 weeks  GOALS: (Goals have been discussed and agreed upon with patient.)  Short-Term Functional Goals: Time Frame: 3 weeks  1. Patient will be independent with home exercise program without exacerbation of symptoms or cueing needed--goal met.   2. Patient will be independent with correct sleeping positions and awareness/avoidance of aggravating positions without cueing needed--goal met.   Discharge Goals: Time Frame: 12 weeks  1. Patient will be independent with all ADLs with minimal onset of right knee pain and no deficits with daily tasks--goal ongoing.  2. Patient will report no fear avoidance with social or recreational activities due to right knee pain--goal met.  3. Patient will score less than or equal to 50/80 on Lower Extremity Functional Scale with minimal effect of right knee pain on patient's ability to manage every day life activities--goal ongoing.  Rehabilitation Potential For Stated Goals: Good  Regarding [name]'s therapy, I certify that the treatment plan above will be carried out by a therapist or under their direction.  Thank you for this referral,  [name], PT     Referring Physician Signature: [name]., MD          Date                     The information in this section was collected on 02/22/2017 (except where otherwise noted).  HISTORY:   History of Present Injury/Illness (Reason for Referral):  Patient reports that he had surgery on Friday (12/24/2016).  He reports he has been doing well. He  reports his pain is under control.  He reports he is ready to get back to walking and moving without fear of pain.    02/22/2017: Patient reports he is doing well.  He reports he is out walking more and trying to ride his bike.  He reports that he feels better overall.  Past Medical History/Comorbidities:   [name]  has a past medical history of Allergic rhinitis, cause  unspecified; BMI 40.0-44.9, adult ([place]); Chronic left sacroiliac joint pain; Chronic radicular low back pain; Diverticulitis; Essential hypertension, benign; Generalized anxiety disorder; GERD (gastroesophageal reflux disease); Hypercholesterolemia; Left lumbar radiculitis; Lumbar stenosis with neurogenic claudication (11/06/2015); Mood disorder ([place]) (07/14/2014); Morbid obesity ([place]); OSA on CPAP; Polycythemia (dx--06/2015); Prediabetes; Spondylolisthesis at L4-L5 level (10/19/2015); Testicular hypofunction; Thrombophlebitis of left leg ([place]) (04/2015); Venous insufficiency of both lower extremities (07/14/2014); and Vitamin D deficiency. He also has no past medical history of Adverse effect of anesthesia; Difficult intubation; Malignant hyperthermia due to anesthesia; Nausea & vomiting; or Pseudocholinesterase deficiency.  Mr. Full  has a past surgical history that includes endoscopy, colon, diagnostic (2011); hx colonoscopy (05/2015); hx knee arthroscopy (Bilateral, 04/2015); and hx orthopaedic (Left, 10/19/2015).  Social History/Living Environment:   Home Environment: Private residence  Living Alone: No  Support Systems: Family member(s), Spouse/Significant Other/Partner  Prior Level of Function/Work/Activity:  Independent  Dominant Side:         RIGHT  Personal Factors:          Sex:  male        Age:  61 y.o.  Current Medications:       Current Outpatient Prescriptions:   ???  doxycycline (ADOXA) 100 mg tablet, Take one table bid for 10 days, Disp: 20 Tab, Rfl: 0  ???  valsartan-hydroCHLOROthiazide (DIOVAN-HCT) 320-12.5 mg per tablet, TAKE 1 TABLET BY MOUTH EVERY DAY, Disp: 30 Tab, Rfl: 5  ???  pravastatin (PRAVACHOL) 40 mg tablet, TAKE 1 TABLET BY MOUTH AT BEDTIME, Disp: 30 Tab, Rfl: 5  ???  metoprolol succinate (TOPROL-XL) 100 mg tablet, Take 1 Tab by mouth nightly. Indications: hypertension, Disp: 30 Tab, Rfl: 5  ???  metFORMIN (GLUCOPHAGE) 1,000 mg tablet, TAKE ONE TABLET BY MOUTH TWICE A DAY, Disp: 60 Tab, Rfl: 5   ???  sildenafil citrate (VIAGRA) 100 mg tablet, Take 1 Tab by mouth as needed., Disp: 12 Tab, Rfl: 5  ???  OMEPRAZOLE PO, Take  by mouth as needed., Disp: , Rfl:   ???  diclofenac EC (VOLTAREN) 50 mg EC tablet, Take 1 Tab by mouth two (2) times a day. (Patient taking differently: Take 50 mg by mouth as needed.), Disp: 180 Tab, Rfl: 2  ???  aspirin (ASPIRIN LOW-STRENGTH) 81 mg chewable tablet, Take 81 mg by mouth every morning. Indications: continue, Disp: , Rfl:   ???  MULTI-VITAMIN HI-PO PO, Take 2 Tabs by mouth every morning. Indications: hold until after surgery, Disp: , Rfl:    Date Last Reviewed:  02/22/2017    Number of Personal Factors/Comorbidities that affect the Plan of Care: 1-2: MODERATE COMPLEXITY   EXAMINATION:   Observation/Orthostatic Postural Assessment:          Posture Assessment: Rounded shoulders, Forward head   Palpation:          Swelling noted in calf region with mild bruising noted behind right knee.  Scars and stitches are intact.    ROM:          R LE Assessment (AROM):   ?? Hip Flexion: 80 degrees    ??  Hip Extension: 0 degrees    ?? Hip Abduction: 10 degrees    ?? Hip Adduction: 0 degrees    ?? Knee Flexion: 100 degrees    ?? Knee Extension: 1 degrees   Strength:          R LE Assessment (Strength):   ?? Hip Flexion: 4/5 with manual muscle testing    ?? Hip Extension: 4/5 with manual muscle testing    ?? Hip Abduction: 4/5 with manual muscle testing    ?? Hip Adduction: 4/5 with manual muscle testing    ?? Knee Flexion: 4/5 with manual muscle testing    ?? Knee Extension: 4/5 with manual muscle testing   Special Tests:          Not tested  Neurological Screen:        Dermatomes:  Within normal limits        Reflexes:  2+    Functional Mobility:         Gait/Ambulation:     Base of Support: Within normal limits  Speed/Cadence: Within normal limits  Step Length: Within normal limits  Swing Pattern: Left symmetrical, Right symmetrical  Stance: Within normal limits  Gait Abnormalities: Within normal limits   Ambulation - Level of Assistance: Independent  Assistive Device: None  ?? Interventions: Verbal cues, Safety awareness training          Transfers:     Sit to Stand: Independent  Stand to Sit: Independent  Stand Pivot Transfers: Independent  Bed to Chair: Modified independent  ?? Lateral Transfers: Independent          Bed Mobility:     Rolling: Independent  Supine to Sit: Independent  Sit to Supine: Independent  Scooting: Independent     Balance:          Intact   Body Structures Involved:  1. Joints  2. Muscles Body Functions Affected:  1. Neuromusculoskeletal  2. Movement Related Activities and Participation Affected:  1. Mobility  2. Self Care   Number of elements (examined above) that affect the Plan of Care: 4+: HIGH COMPLEXITY   CLINICAL PRESENTATION:   Presentation: Evolving clinical presentation with changing clinical characteristics: MODERATE COMPLEXITY   CLINICAL DECISION MAKING:   Outcome Measure:   Tool Used: Lower Extremity Functional Scale (LEFS)  Score:  Initial: 17/80 Most Recent: 50/80 (Date: 02/22/2017 )   Interpretation of Score: 20 questions each scored on a 5 point scale with 0 representing "extreme difficulty or unable to perform" and 4 representing "no difficulty".  The lower the score, the greater the functional disability. 80/80 represents no disability.  Minimal detectable change is 9 points.  Score 80 79-63 62-48 47-32 31-16 15-1 0   Modifier CH CI CJ CK CL CM CN     Medical Necessity:   ?? Patient is expected to demonstrate progress in strength, range of motion, balance and coordination to increase independence with daily activities.  ?? Patient demonstrates good rehab potential due to higher previous functional level.  ?? Skilled intervention continues to be required due to decreased mobility.  Reason for Services/Other Comments:  ?? Patient continues to demonstrate capacity to improve overall mobility which will increase independence and increase safety.    Use of outcome tool(s) and clinical judgement create a POC that gives a: Questionable prediction of patient's progress: MODERATE COMPLEXITY            TREATMENT:   (In addition to Assessment/Re-Assessment sessions the following treatments were rendered)  Pre-treatment Symptoms/Complaints:  02/22/2017:  Patient reports he is feeling better overall, just a little stiffness in his knee today.  Pain: Initial:   Pain Intensity 1: 5  Pain Location 1: Knee  Pain Orientation 1: Right  Pain Intervention(s) 1: Rest, Medication (see MAR)  Post Session:  4/10     THERAPEUTIC EXERCISE: (45 minutes):  Exercises per grid below to improve mobility, strength, balance and coordination.  Required minimal verbal and manual cues to promote proper body alignment, promote proper body posture and promote proper body mechanics.  Progressed resistance, range, repetitions and complexity of movement as indicated.   Date:  02/22/2017   Activity/Exercise Parameters   Airdyne 7 minutes  Seat #7   Calf stretch on slant board 10 reps  5 second hold   Nautilus leg press 135 pounds  B LE (R >L)   Nautilus leg curl 40 pounds  B LE (R >L)   Nautilus leg extension 40 pounds  B LE (R >L)   Marching in hallway 2 laps   Walking backward 4 laps   Tiltboard: right/left Static and dynamic   Tiltboard: forward/backward Static and dynamic   Step up and over 4 inch  20 reps      MANUAL THERAPY: (0 minutes): Joint mobilization and patellar mobilization was utilized and necessary because of the patient's restricted joint motion, painful spasm, loss of articular motion and restricted motion of soft tissue.      MedBridge Portal    Treatment/Session Assessment:    ?? Response to Treatment: Patient tolerated treatment with decreased pain/stiffness after treatment.  Continue plan of care.    ?? Compliance with Program/Exercises: Compliant  ?? Recommendations/Intent for next treatment session: "Next visit will focus on advancements to more challenging activities".   Total Treatment Duration:  PT Patient Time In/Time Out  Time In: 0945  Time Out: [address] [place]

## 2017-02-27 ENCOUNTER — Inpatient Hospital Stay: Admit: 2017-02-27 | Payer: BLUE CROSS/BLUE SHIELD | Primary: Family Medicine

## 2017-02-27 DIAGNOSIS — M25561 Pain in right knee: Secondary | ICD-10-CM

## 2017-02-27 NOTE — Progress Notes (Signed)
Steven [name]  DOB: 1956-10-29  Primary: [place]*  Secondary:  [place] at [facility]  [address], [place]  Phone:[phone]   [phone]          OUTPATIENT PHYSICAL THERAPY:Daily Note 02/27/2017    ICD-10: Treatment Diagnosis:   ?? Pain in right knee (M25.561)  ?? Stiffness of right knee, not elsewhere classified (M25.661)  Precautions/Allergies:   Nsaids (non-steroidal anti-inflammatory drug)   Fall Risk Score: 1 (? 5 = High Risk)  MD Orders: Evaluate and Treat MEDICAL/REFERRING DIAGNOSIS:  S/p right knee scope; ACL reconstruction utilizing allograft  DATE OF ONSET: Surgery: 11/23/2016  REFERRING PHYSICIAN: Maryla Morrow., MD  RETURN PHYSICIAN APPOINTMENT: 12/05/2016     ASSESSMENT:  Steven [name] presents with improving mobility, strength, and pain in right knee secondary to ACL repair.  Patient has attended a total of 11 scheduled physical therapy visits including initial evaluation on 11/28/2016.  Treatment has consisted of strengthening, stretching, manual therapy, and modalities to improve overall pain, mobility, and performance with activities of daily living.     PROBLEM LIST (Impacting functional limitations):  1. Decreased Strength  2. Decreased ADL/Functional Activities  3. Decreased Ambulation Ability/Technique  4. Decreased Balance  5. Increased Pain  6. Decreased Activity Tolerance INTERVENTIONS PLANNED:  1. Balance Exercise  2. Cold  3. Gait Training  4. Heat  5. Home Exercise Program (HEP)  6. Manual Therapy  7. Neuromuscular Re-education/Strengthening  8. Range of Motion (ROM)  9. Therapeutic Activites  10. Therapeutic Exercise/Strengthening   TREATMENT PLAN:  Effective Dates: 02/25/2017 TO 04/27/2017.  Frequency/Duration: 2 times a week for 8 weeks  GOALS: (Goals have been discussed and agreed upon with patient.)  Short-Term Functional Goals: Time Frame: 3 weeks  1. Patient will be independent with home exercise program without  exacerbation of symptoms or cueing needed--goal met.   2. Patient will be independent with correct sleeping positions and awareness/avoidance of aggravating positions without cueing needed--goal met.   Discharge Goals: Time Frame: 12 weeks  1. Patient will be independent with all ADLs with minimal onset of right knee pain and no deficits with daily tasks--goal ongoing.  2. Patient will report no fear avoidance with social or recreational activities due to right knee pain--goal met.  3. Patient will score less than or equal to 50/80 on Lower Extremity Functional Scale with minimal effect of right knee pain on patient's ability to manage every day life activities--goal ongoing.  Rehabilitation Potential For Stated Goals: Good            The information in this section was collected on 02/22/2017 (except where otherwise noted).  HISTORY:   History of Present Injury/Illness (Reason for Referral):  Patient reports that he had surgery on Friday (12/24/2016).  He reports he has been doing well. He reports his pain is under control.  He reports he is ready to get back to walking and moving without fear of pain.    02/22/2017: Patient reports he is doing well.  He reports he is out walking more and trying to ride his bike.  He reports that he feels better overall.  Past Medical History/Comorbidities:   Steven [name]  has a past medical history of Allergic rhinitis, cause unspecified; BMI 40.0-44.9, adult ([place]); Chronic left sacroiliac joint pain; Chronic radicular low back pain; Diverticulitis; Essential hypertension, benign; Generalized anxiety disorder; GERD (gastroesophageal reflux disease); Hypercholesterolemia; Left lumbar radiculitis; Lumbar stenosis with neurogenic claudication (11/06/2015);  Mood disorder ([place]) (07/14/2014); Morbid obesity ([place]); OSA on CPAP; Polycythemia (dx--06/2015); Prediabetes; Spondylolisthesis at L4-L5 level (10/19/2015); Testicular hypofunction; Thrombophlebitis of left leg ([place]) (04/2015); Venous  insufficiency of both lower extremities (07/14/2014); and Vitamin D deficiency. He also has no past medical history of Adverse effect of anesthesia; Difficult intubation; Malignant hyperthermia due to anesthesia; Nausea & vomiting; or Pseudocholinesterase deficiency.  Steven [name]  has a past surgical history that includes endoscopy, colon, diagnostic (2011); hx colonoscopy (05/2015); hx knee arthroscopy (Bilateral, 04/2015); and hx orthopaedic (Left, 10/19/2015).  Social History/Living Environment:   Home Environment: Private residence  Living Alone: No  Support Systems: Family member(s), Spouse/Significant Other/Partner  Prior Level of Function/Work/Activity:  Independent  Dominant Side:         RIGHT  Personal Factors:          Sex:  male        Age:  61 y.o.  Current Medications:       Current Outpatient Prescriptions:   ???  doxycycline (ADOXA) 100 mg tablet, Take one table bid for 10 days, Disp: 20 Tab, Rfl: 0  ???  valsartan-hydroCHLOROthiazide (DIOVAN-HCT) 320-12.5 mg per tablet, TAKE 1 TABLET BY MOUTH EVERY DAY, Disp: 30 Tab, Rfl: 5  ???  pravastatin (PRAVACHOL) 40 mg tablet, TAKE 1 TABLET BY MOUTH AT BEDTIME, Disp: 30 Tab, Rfl: 5  ???  metoprolol succinate (TOPROL-XL) 100 mg tablet, Take 1 Tab by mouth nightly. Indications: hypertension, Disp: 30 Tab, Rfl: 5  ???  metFORMIN (GLUCOPHAGE) 1,000 mg tablet, TAKE ONE TABLET BY MOUTH TWICE A DAY, Disp: 60 Tab, Rfl: 5  ???  sildenafil citrate (VIAGRA) 100 mg tablet, Take 1 Tab by mouth as needed., Disp: 12 Tab, Rfl: 5  ???  OMEPRAZOLE PO, Take  by mouth as needed., Disp: , Rfl:   ???  diclofenac EC (VOLTAREN) 50 mg EC tablet, Take 1 Tab by mouth two (2) times a day. (Patient taking differently: Take 50 mg by mouth as needed.), Disp: 180 Tab, Rfl: 2  ???  aspirin (ASPIRIN LOW-STRENGTH) 81 mg chewable tablet, Take 81 mg by mouth every morning. Indications: continue, Disp: , Rfl:   ???  MULTI-VITAMIN HI-PO PO, Take 2 Tabs by mouth every morning.  Indications: hold until after surgery, Disp: , Rfl:    Date Last Reviewed:  02/27/2017    Number of Personal Factors/Comorbidities that affect the Plan of Care: 1-2: MODERATE COMPLEXITY   EXAMINATION:   Observation/Orthostatic Postural Assessment:          Posture Assessment: Rounded shoulders, Forward head   Palpation:          Swelling noted in calf region with mild bruising noted behind right knee.  Scars and stitches are intact.    ROM:          R LE Assessment (AROM):   ?? Hip Flexion: 80 degrees    ?? Hip Extension: 0 degrees    ?? Hip Abduction: 10 degrees    ?? Hip Adduction: 0 degrees    ?? Knee Flexion: 100 degrees    ?? Knee Extension: 1 degrees   Strength:          R LE Assessment (Strength):   ?? Hip Flexion: 4/5 with manual muscle testing    ?? Hip Extension: 4/5 with manual muscle testing    ?? Hip Abduction: 4/5 with manual muscle testing    ?? Hip Adduction: 4/5 with manual muscle testing    ?? Knee Flexion: 4/5 with manual muscle  testing    ?? Knee Extension: 4/5 with manual muscle testing   Special Tests:          Not tested  Neurological Screen:        Dermatomes:  Within normal limits        Reflexes:  2+    Functional Mobility:         Gait/Ambulation:     Base of Support: Within normal limits  Speed/Cadence: Within normal limits  Step Length: Within normal limits  Swing Pattern: Left symmetrical, Right symmetrical  Stance: Within normal limits  Gait Abnormalities: Within normal limits  Ambulation - Level of Assistance: Independent  Assistive Device: None  ?? Interventions: Verbal cues, Safety awareness training          Transfers:     Sit to Stand: Independent  Stand to Sit: Independent  Stand Pivot Transfers: Independent  Bed to Chair: Modified independent  ?? Lateral Transfers: Independent          Bed Mobility:     Rolling: Independent  Supine to Sit: Independent  Sit to Supine: Independent  Scooting: Independent     Balance:          Intact   Body Structures Involved:  1. Joints   2. Muscles Body Functions Affected:  1. Neuromusculoskeletal  2. Movement Related Activities and Participation Affected:  1. Mobility  2. Self Care   Number of elements (examined above) that affect the Plan of Care: 4+: HIGH COMPLEXITY   CLINICAL PRESENTATION:   Presentation: Evolving clinical presentation with changing clinical characteristics: MODERATE COMPLEXITY   CLINICAL DECISION MAKING:   Outcome Measure:   Tool Used: Lower Extremity Functional Scale (LEFS)  Score:  Initial: 17/80 Most Recent: 50/80 (Date: 02/22/2017 )   Interpretation of Score: 20 questions each scored on a 5 point scale with 0 representing "extreme difficulty or unable to perform" and 4 representing "no difficulty".  The lower the score, the greater the functional disability. 80/80 represents no disability.  Minimal detectable change is 9 points.  Score 80 79-63 62-48 47-32 31-16 15-1 0   Modifier CH CI CJ CK CL CM CN     Medical Necessity:   ?? Patient is expected to demonstrate progress in strength, range of motion, balance and coordination to increase independence with daily activities.  ?? Patient demonstrates good rehab potential due to higher previous functional level.  ?? Skilled intervention continues to be required due to decreased mobility.  Reason for Services/Other Comments:  ?? Patient continues to demonstrate capacity to improve overall mobility which will increase independence and increase safety.   Use of outcome tool(s) and clinical judgement create a POC that gives a: Questionable prediction of patient's progress: MODERATE COMPLEXITY            TREATMENT:   (In addition to Assessment/Re-Assessment sessions the following treatments were rendered)  Pre-treatment Symptoms/Complaints:  02/27/2017:  Patient reports he is doing well.  He reports he still has some swelling but it has been better.  Pain: Initial:   Pain Intensity 1: 5  Pain Location 1: Knee  Pain Orientation 1: Right   Pain Intervention(s) 1: Rest, Medication (see MAR)  Post Session:  4/10     THERAPEUTIC EXERCISE: (45 minutes):  Exercises per grid below to improve mobility, strength, balance and coordination.  Required minimal verbal and manual cues to promote proper body alignment, promote proper body posture and promote proper body mechanics.  Progressed resistance, range, repetitions and complexity of  movement as indicated.   Date:  02/27/2017   Activity/Exercise Parameters   Airdyne 7 minutes  Seat #7   Calf stretch on slant board 10 reps  5 second hold   Nautilus leg press 185 pounds  B LE     85 pounds  R LE   Nautilus leg curl 55 pounds  B LE     35 pounds  R LE   Nautilus leg extension 55 pounds  B LE     35 pounds  R LE   Marching in hallway 2 laps   Walking backward 4 laps   Standing toe raises 12 reps      MANUAL THERAPY: (0 minutes): Joint mobilization and patellar mobilization was utilized and necessary because of the patient's restricted joint motion, painful spasm, loss of articular motion and restricted motion of soft tissue.      MedBridge Portal    Treatment/Session Assessment:    ?? Response to Treatment: Patient tolerated treatment with decreased pain/stiffness after treatment.  Continue plan of care.    ?? Compliance with Program/Exercises: Compliant  ?? Recommendations/Intent for next treatment session: "Next visit will focus on advancements to more challenging activities".  Total Treatment Duration:  PT Patient Time In/Time Out  Time In: 1630  Time Out: [place], PT

## 2017-03-01 NOTE — Progress Notes (Signed)
[facility]rapy Center at [facility]   [facility] [address]  [address], [address] [place], [place] [id number]  Phone: [phone]   Fax: [phone]    OUTPATIENT DAILY NOTE    NAME/AGE/GENDER: Barton DuboisMichael L Whitaker is a 61 y.o. male.     DATE: 03/01/2017    Patient did not show for his appointment for today due to unknown reasons.  Will plan to follow up on next scheduled visit.    Allene DillonJennifer N Martha Soltys, PT

## 2017-03-02 ENCOUNTER — Inpatient Hospital Stay: Payer: BLUE CROSS/BLUE SHIELD | Primary: Family Medicine

## 2017-03-06 ENCOUNTER — Inpatient Hospital Stay: Admit: 2017-03-06 | Payer: BLUE CROSS/BLUE SHIELD | Primary: Family Medicine

## 2017-03-06 NOTE — Progress Notes (Signed)
Steven [name]  DOB: 27-Jan-1956  Primary: [place]*  Secondary:  Steven [name]  [address], [place]  Phone:[phone]   [phone]          OUTPATIENT PHYSICAL THERAPY:Daily Note 03/06/2017    ICD-10: Treatment Diagnosis:   ?? Pain in right knee (M25.561)  ?? Stiffness of right knee, not elsewhere classified (M25.661)  Precautions/Allergies:   Nsaids (non-steroidal anti-inflammatory drug)   Fall Risk Score: 1 (? 5 = High Risk)  MD Orders: Evaluate and Treat MEDICAL/REFERRING DIAGNOSIS:  S/p right knee scope; ACL reconstruction utilizing allograft  DATE OF ONSET: Surgery: 11/23/2016  REFERRING PHYSICIAN: Maryla Morrow., MD  RETURN PHYSICIAN APPOINTMENT: 12/05/2016     ASSESSMENT:  Steven [name] presents with improving mobility, strength, and pain in right knee secondary to ACL repair.  Patient has attended a total of 12 scheduled physical therapy visits including initial evaluation on 11/28/2016.  Treatment has consisted of strengthening, stretching, manual therapy, and modalities to improve overall pain, mobility, and performance with activities of daily living.     PROBLEM LIST (Impacting functional limitations):  1. Decreased Strength  2. Decreased ADL/Functional Activities  3. Decreased Ambulation Ability/Technique  4. Decreased Balance  5. Increased Pain  6. Decreased Activity Tolerance INTERVENTIONS PLANNED:  1. Balance Exercise  2. Cold  3. Gait Training  4. Heat  5. Home Exercise Program (HEP)  6. Manual Therapy  7. Neuromuscular Re-education/Strengthening  8. Range of Motion (ROM)  9. Therapeutic Activites  10. Therapeutic Exercise/Strengthening   TREATMENT PLAN:  Effective Dates: 02/25/2017 TO 04/27/2017.  Frequency/Duration: 2 times a week for 8 weeks  GOALS: (Goals have been discussed and agreed upon with patient.)  Short-Term Functional Goals: Time Frame: 3 weeks  1. Patient will be independent with home exercise program without  exacerbation of symptoms or cueing needed--goal met.   2. Patient will be independent with correct sleeping positions and awareness/avoidance of aggravating positions without cueing needed--goal met.   Discharge Goals: Time Frame: 12 weeks  1. Patient will be independent with all ADLs with minimal onset of right knee pain and no deficits with daily tasks--goal ongoing.  2. Patient will report no fear avoidance with social or recreational activities due to right knee pain--goal met.  3. Patient will score less than or equal to 50/80 on Lower Extremity Functional Scale with minimal effect of right knee pain on patient's ability to manage every day life activities--goal ongoing.  Rehabilitation Potential For Stated Goals: Good            The information in this section was collected on 02/22/2017 (except where otherwise noted).  HISTORY:   History of Present Injury/Illness (Reason for Referral):  Patient reports that he had surgery on Friday (12/24/2016).  He reports he has been doing well. He reports his pain is under control.  He reports he is ready to get back to walking and moving without fear of pain.    02/22/2017: Patient reports he is doing well.  He reports he is out walking more and trying to ride his bike.  He reports that he feels better overall.  Past Medical History/Comorbidities:   Steven [name]  has a past medical history of Allergic rhinitis, cause unspecified; BMI 40.0-44.9, adult ([place]); Chronic left sacroiliac joint pain; Chronic radicular low back pain; Diverticulitis; Essential hypertension, benign; Generalized anxiety disorder; GERD (gastroesophageal reflux disease); Hypercholesterolemia; Left lumbar radiculitis; Lumbar stenosis with neurogenic claudication (11/06/2015);  Mood disorder ([place]) (07/14/2014); Morbid obesity ([place]); OSA on CPAP; Polycythemia (dx--06/2015); Prediabetes; Spondylolisthesis at L4-L5 level (10/19/2015); Testicular hypofunction; Thrombophlebitis of left leg ([place]) (04/2015); Venous  insufficiency of both lower extremities (07/14/2014); and Vitamin D deficiency. He also has no past medical history of Adverse effect of anesthesia; Difficult intubation; Malignant hyperthermia due to anesthesia; Nausea & vomiting; or Pseudocholinesterase deficiency.  Steven [name]  has a past surgical history that includes endoscopy, colon, diagnostic (2011); hx colonoscopy (05/2015); hx knee arthroscopy (Bilateral, 04/2015); and hx orthopaedic (Left, 10/19/2015).  Social History/Living Environment:   Home Environment: Private residence  Living Alone: No  Support Systems: Family member(s), Spouse/Significant Other/Partner  Prior Level of Function/Work/Activity:  Independent  Dominant Side:         RIGHT  Personal Factors:          Sex:  male        Age:  61 y.o.  Current Medications:       Current Outpatient Prescriptions:   ???  doxycycline (ADOXA) 100 mg tablet, Take one table bid for 10 days, Disp: 20 Tab, Rfl: 0  ???  valsartan-hydroCHLOROthiazide (DIOVAN-HCT) 320-12.5 mg per tablet, TAKE 1 TABLET BY MOUTH EVERY DAY, Disp: 30 Tab, Rfl: 5  ???  pravastatin (PRAVACHOL) 40 mg tablet, TAKE 1 TABLET BY MOUTH AT BEDTIME, Disp: 30 Tab, Rfl: 5  ???  metoprolol succinate (TOPROL-XL) 100 mg tablet, Take 1 Tab by mouth nightly. Indications: hypertension, Disp: 30 Tab, Rfl: 5  ???  metFORMIN (GLUCOPHAGE) 1,000 mg tablet, TAKE ONE TABLET BY MOUTH TWICE A DAY, Disp: 60 Tab, Rfl: 5  ???  sildenafil citrate (VIAGRA) 100 mg tablet, Take 1 Tab by mouth as needed., Disp: 12 Tab, Rfl: 5  ???  OMEPRAZOLE PO, Take  by mouth as needed., Disp: , Rfl:   ???  diclofenac EC (VOLTAREN) 50 mg EC tablet, Take 1 Tab by mouth two (2) times a day. (Patient taking differently: Take 50 mg by mouth as needed.), Disp: 180 Tab, Rfl: 2  ???  aspirin (ASPIRIN LOW-STRENGTH) 81 mg chewable tablet, Take 81 mg by mouth every morning. Indications: continue, Disp: , Rfl:   ???  MULTI-VITAMIN HI-PO PO, Take 2 Tabs by mouth every morning.  Indications: hold until after surgery, Disp: , Rfl:    Date Last Reviewed:  03/06/2017    Number of Personal Factors/Comorbidities that affect the Plan of Care: 1-2: MODERATE COMPLEXITY   EXAMINATION:   Observation/Orthostatic Postural Assessment:          Posture Assessment: Rounded shoulders, Forward head   Palpation:          Swelling noted in calf region with mild bruising noted behind right knee.  Scars and stitches are intact.    ROM:          R LE Assessment (AROM):   ?? Hip Flexion: 80 degrees    ?? Hip Extension: 0 degrees    ?? Hip Abduction: 10 degrees    ?? Hip Adduction: 0 degrees    ?? Knee Flexion: 100 degrees    ?? Knee Extension: 1 degrees   Strength:          R LE Assessment (Strength):   ?? Hip Flexion: 4/5 with manual muscle testing    ?? Hip Extension: 4/5 with manual muscle testing    ?? Hip Abduction: 4/5 with manual muscle testing    ?? Hip Adduction: 4/5 with manual muscle testing    ?? Knee Flexion: 4/5 with manual muscle  testing    ?? Knee Extension: 4/5 with manual muscle testing   Special Tests:          Not tested  Neurological Screen:        Dermatomes:  Within normal limits        Reflexes:  2+    Functional Mobility:         Gait/Ambulation:     Base of Support: Within normal limits  Speed/Cadence: Within normal limits  Step Length: Within normal limits  Swing Pattern: Left symmetrical, Right symmetrical  Stance: Within normal limits  Gait Abnormalities: Within normal limits  Ambulation - Level of Assistance: Independent  Assistive Device: None  ?? Interventions: Verbal cues, Safety awareness training          Transfers:     Sit to Stand: Independent  Stand to Sit: Independent  Stand Pivot Transfers: Independent  Bed to Chair: Modified independent  ?? Lateral Transfers: Independent          Bed Mobility:     Rolling: Independent  Supine to Sit: Independent  Sit to Supine: Independent  Scooting: Independent     Balance:          Intact   Body Structures Involved:  1. Joints   2. Muscles Body Functions Affected:  1. Neuromusculoskeletal  2. Movement Related Activities and Participation Affected:  1. Mobility  2. Self Care   Number of elements (examined above) that affect the Plan of Care: 4+: HIGH COMPLEXITY   CLINICAL PRESENTATION:   Presentation: Evolving clinical presentation with changing clinical characteristics: MODERATE COMPLEXITY   CLINICAL DECISION MAKING:   Outcome Measure:   Tool Used: Lower Extremity Functional Scale (LEFS)  Score:  Initial: 17/80 Most Recent: 50/80 (Date: 02/22/2017 )   Interpretation of Score: 20 questions each scored on a 5 point scale with 0 representing "extreme difficulty or unable to perform" and 4 representing "no difficulty".  The lower the score, the greater the functional disability. 80/80 represents no disability.  Minimal detectable change is 9 points.  Score 80 79-63 62-48 47-32 31-16 15-1 0   Modifier CH CI CJ CK CL CM CN     Medical Necessity:   ?? Patient is expected to demonstrate progress in strength, range of motion, balance and coordination to increase independence with daily activities.  ?? Patient demonstrates good rehab potential due to higher previous functional level.  ?? Skilled intervention continues to be required due to decreased mobility.  Reason for Services/Other Comments:  ?? Patient continues to demonstrate capacity to improve overall mobility which will increase independence and increase safety.   Use of outcome tool(s) and clinical judgement create a POC that gives a: Questionable prediction of patient's progress: MODERATE COMPLEXITY            TREATMENT:   (In addition to Assessment/Re-Assessment sessions the following treatments were rendered)  Pre-treatment Symptoms/Complaints:  03/06/2017:  Patient reports he is doing really well. He reports he went camping and did not have any pain.  Pain: Initial:   Pain Intensity 1: 3  Pain Location 1: Knee  Pain Orientation 1: Right   Pain Intervention(s) 1: Rest, Medication (see MAR)  Post Session:  2/10     THERAPEUTIC EXERCISE: (45 minutes):  Exercises per grid below to improve mobility, strength, balance and coordination.  Required minimal verbal and manual cues to promote proper body alignment, promote proper body posture and promote proper body mechanics.  Progressed resistance, range, repetitions and complexity of movement  as indicated.   Date:  03/06/2017   Activity/Exercise Parameters   Airdyne 7 minutes  Seat #7   Calf stretch on slant board 10 reps  5 second hold   Nautilus leg press 185 pounds  B LE     85 pounds  R LE   Nautilus leg curl 55 pounds  B LE     35 pounds  R LE   Nautilus leg extension 55 pounds  B LE     35 pounds  R LE   Marching in hallway 2 laps   Walking backward 4 laps   Standing toe raises 12 reps      MANUAL THERAPY: (0 minutes): Joint mobilization and patellar mobilization was utilized and necessary because of the patient's restricted joint motion, painful spasm, loss of articular motion and restricted motion of soft tissue.      MedBridge Portal    Treatment/Session Assessment:    ?? Response to Treatment: Patient tolerated treatment with decreased pain/stiffness after treatment.  Continue plan of care.    ?? Compliance with Program/Exercises: Compliant  ?? Recommendations/Intent for next treatment session: "Next visit will focus on advancements to more challenging activities".  Total Treatment Duration:  PT Patient Time In/Time Out  Time In: 0945  Time Out: [address] [place]

## 2017-03-08 NOTE — Progress Notes (Signed)
[facility]   Medical Office Building [address]  [address], [address] [place], [place] [address]  Phone: [phone]   Fax: [phone]    OUTPATIENT DAILY NOTE    NAME/AGE/GENDER: Barton DuboisMichael L Larocque is a 61 y.o. male.     DATE: 03/08/2017    Patient did not show for his appointment for today due to unknown reasons.  Will plan to follow up on next scheduled visit.    Allene DillonJennifer N Kemi Gell, PT

## 2017-03-09 ENCOUNTER — Inpatient Hospital Stay: Payer: BLUE CROSS/BLUE SHIELD | Primary: Family Medicine

## 2017-03-12 ENCOUNTER — Inpatient Hospital Stay: Admit: 2017-03-12 | Payer: BLUE CROSS/BLUE SHIELD | Primary: Family Medicine

## 2017-03-12 NOTE — Progress Notes (Signed)
Oletta Lamas  DOB: Mar 18, 1956  Primary: [place]*  Secondary:  [place] [facility]  [address], [place]  Phone:[phone]   [phone]          OUTPATIENT PHYSICAL THERAPY:Daily Note 03/12/2017    ICD-10: Treatment Diagnosis:   ?? Pain in right knee (M25.561)  ?? Stiffness of right knee, not elsewhere classified (M25.661)  Precautions/Allergies:   Nsaids (non-steroidal anti-inflammatory drug)   Fall Risk Score: 1 (? 5 = High Risk)  MD Orders: Evaluate and Treat MEDICAL/REFERRING DIAGNOSIS:  S/p right knee scope; ACL reconstruction utilizing allograft  DATE OF ONSET: Surgery: 11/23/2016  REFERRING PHYSICIAN: Maryla Morrow., MD  RETURN PHYSICIAN APPOINTMENT: 12/05/2016     ASSESSMENT:  Mr. Castilleja presents with improving mobility, strength, and pain in right knee secondary to ACL repair.  Patient has attended a total of 13 scheduled physical therapy visits including initial evaluation on 11/28/2016.  Treatment has consisted of strengthening, stretching, manual therapy, and modalities to improve overall pain, mobility, and performance with activities of daily living.     PROBLEM LIST (Impacting functional limitations):  1. Decreased Strength  2. Decreased ADL/Functional Activities  3. Decreased Ambulation Ability/Technique  4. Decreased Balance  5. Increased Pain  6. Decreased Activity Tolerance INTERVENTIONS PLANNED:  1. Balance Exercise  2. Cold  3. Gait Training  4. Heat  5. Home Exercise Program (HEP)  6. Manual Therapy  7. Neuromuscular Re-education/Strengthening  8. Range of Motion (ROM)  9. Therapeutic Activites  10. Therapeutic Exercise/Strengthening   TREATMENT PLAN:  Effective Dates: 02/25/2017 TO 04/27/2017.  Frequency/Duration: 2 times a week for 8 weeks  GOALS: (Goals have been discussed and agreed upon with patient.)  Short-Term Functional Goals: Time Frame: 3 weeks   1. Patient will be independent with home exercise program without exacerbation of symptoms or cueing needed--goal met.   2. Patient will be independent with correct sleeping positions and awareness/avoidance of aggravating positions without cueing needed--goal met.   Discharge Goals: Time Frame: 12 weeks  1. Patient will be independent with all ADLs with minimal onset of right knee pain and no deficits with daily tasks--goal ongoing.  2. Patient will report no fear avoidance with social or recreational activities due to right knee pain--goal met.  3. Patient will score less than or equal to 50/80 on Lower Extremity Functional Scale with minimal effect of right knee pain on patient's ability to manage every day life activities--goal ongoing.  Rehabilitation Potential For Stated Goals: Good            The information in this section was collected on 02/22/2017 (except where otherwise noted).  HISTORY:   History of Present Injury/Illness (Reason for Referral):  Patient reports that he had surgery on Friday (12/24/2016).  He reports he has been doing well. He reports his pain is under control.  He reports he is ready to get back to walking and moving without fear of pain.    02/22/2017: Patient reports he is doing well.  He reports he is out walking more and trying to ride his bike.  He reports that he feels better overall.  Past Medical History/Comorbidities:   Mr. Hearn  has a past medical history of Allergic rhinitis, cause unspecified; BMI 40.0-44.9, adult ([place]); Chronic left sacroiliac joint pain; Chronic radicular low back pain; Diverticulitis; Essential hypertension, benign; Generalized anxiety disorder; GERD (gastroesophageal reflux disease); Hypercholesterolemia; Left lumbar radiculitis; Lumbar stenosis with neurogenic claudication (11/06/2015);  Mood disorder ([place]) (07/14/2014); Morbid obesity ([place]); OSA on CPAP; Polycythemia (dx--06/2015); Prediabetes; Spondylolisthesis at L4-L5 level (10/19/2015); Testicular  hypofunction; Thrombophlebitis of left leg ([place]) (04/2015); Venous insufficiency of both lower extremities (07/14/2014); and Vitamin D deficiency. He also has no past medical history of Adverse effect of anesthesia; Difficult intubation; Malignant hyperthermia due to anesthesia; Nausea & vomiting; or Pseudocholinesterase deficiency.  Mr. Wilson  has a past surgical history that includes endoscopy, colon, diagnostic (2011); hx colonoscopy (05/2015); hx knee arthroscopy (Bilateral, 04/2015); and hx orthopaedic (Left, 10/19/2015).  Social History/Living Environment:   Home Environment: Private residence  Living Alone: No  Support Systems: Family member(s), Spouse/Significant Other/Partner  Prior Level of Function/Work/Activity:  Independent  Dominant Side:         RIGHT  Personal Factors:          Sex:  male        Age:  61 y.o.  Current Medications:       Current Outpatient Prescriptions:   ???  doxycycline (ADOXA) 100 mg tablet, Take one table bid for 10 days, Disp: 20 Tab, Rfl: 0  ???  valsartan-hydroCHLOROthiazide (DIOVAN-HCT) 320-12.5 mg per tablet, TAKE 1 TABLET BY MOUTH EVERY DAY, Disp: 30 Tab, Rfl: 5  ???  pravastatin (PRAVACHOL) 40 mg tablet, TAKE 1 TABLET BY MOUTH AT BEDTIME, Disp: 30 Tab, Rfl: 5  ???  metoprolol succinate (TOPROL-XL) 100 mg tablet, Take 1 Tab by mouth nightly. Indications: hypertension, Disp: 30 Tab, Rfl: 5  ???  metFORMIN (GLUCOPHAGE) 1,000 mg tablet, TAKE ONE TABLET BY MOUTH TWICE A DAY, Disp: 60 Tab, Rfl: 5  ???  sildenafil citrate (VIAGRA) 100 mg tablet, Take 1 Tab by mouth as needed., Disp: 12 Tab, Rfl: 5  ???  OMEPRAZOLE PO, Take  by mouth as needed., Disp: , Rfl:   ???  diclofenac EC (VOLTAREN) 50 mg EC tablet, Take 1 Tab by mouth two (2) times a day. (Patient taking differently: Take 50 mg by mouth as needed.), Disp: 180 Tab, Rfl: 2  ???  aspirin (ASPIRIN LOW-STRENGTH) 81 mg chewable tablet, Take 81 mg by mouth every morning. Indications: continue, Disp: , Rfl:    ???  MULTI-VITAMIN HI-PO PO, Take 2 Tabs by mouth every morning. Indications: hold until after surgery, Disp: , Rfl:    Date Last Reviewed:  03/12/2017    Number of Personal Factors/Comorbidities that affect the Plan of Care: 1-2: MODERATE COMPLEXITY   EXAMINATION:   Observation/Orthostatic Postural Assessment:          Posture Assessment: Rounded shoulders, Forward head   Palpation:          Swelling noted in calf region with mild bruising noted behind right knee.  Scars and stitches are intact.    ROM:          R LE Assessment (AROM):   ?? Hip Flexion: 80 degrees    ?? Hip Extension: 0 degrees    ?? Hip Abduction: 10 degrees    ?? Hip Adduction: 0 degrees    ?? Knee Flexion: 100 degrees    ?? Knee Extension: 1 degrees   Strength:          R LE Assessment (Strength):   ?? Hip Flexion: 4/5 with manual muscle testing    ?? Hip Extension: 4/5 with manual muscle testing    ?? Hip Abduction: 4/5 with manual muscle testing    ?? Hip Adduction: 4/5 with manual muscle testing    ?? Knee Flexion: 4/5 with manual muscle  testing    ?? Knee Extension: 4/5 with manual muscle testing   Special Tests:          Not tested  Neurological Screen:        Dermatomes:  Within normal limits        Reflexes:  2+    Functional Mobility:         Gait/Ambulation:     Base of Support: Within normal limits  Speed/Cadence: Within normal limits  Step Length: Within normal limits  Swing Pattern: Left symmetrical, Right symmetrical  Stance: Within normal limits  Gait Abnormalities: Within normal limits  Ambulation - Level of Assistance: Independent  Assistive Device: None  ?? Interventions: Verbal cues, Safety awareness training          Transfers:     Sit to Stand: Independent  Stand to Sit: Independent  Stand Pivot Transfers: Independent  Bed to Chair: Modified independent  ?? Lateral Transfers: Independent          Bed Mobility:     Rolling: Independent  Supine to Sit: Independent  Sit to Supine: Independent  Scooting: Independent     Balance:          Intact    Body Structures Involved:  1. Joints  2. Muscles Body Functions Affected:  1. Neuromusculoskeletal  2. Movement Related Activities and Participation Affected:  1. Mobility  2. Self Care   Number of elements (examined above) that affect the Plan of Care: 4+: HIGH COMPLEXITY   CLINICAL PRESENTATION:   Presentation: Evolving clinical presentation with changing clinical characteristics: MODERATE COMPLEXITY   CLINICAL DECISION MAKING:   Outcome Measure:   Tool Used: Lower Extremity Functional Scale (LEFS)  Score:  Initial: 17/80 Most Recent: 50/80 (Date: 02/22/2017 )   Interpretation of Score: 20 questions each scored on a 5 point scale with 0 representing "extreme difficulty or unable to perform" and 4 representing "no difficulty".  The lower the score, the greater the functional disability. 80/80 represents no disability.  Minimal detectable change is 9 points.  Score 80 79-63 62-48 47-32 31-16 15-1 0   Modifier CH CI CJ CK CL CM CN     Medical Necessity:   ?? Patient is expected to demonstrate progress in strength, range of motion, balance and coordination to increase independence with daily activities.  ?? Patient demonstrates good rehab potential due to higher previous functional level.  ?? Skilled intervention continues to be required due to decreased mobility.  Reason for Services/Other Comments:  ?? Patient continues to demonstrate capacity to improve overall mobility which will increase independence and increase safety.   Use of outcome tool(s) and clinical judgement create a POC that gives a: Questionable prediction of patient's progress: MODERATE COMPLEXITY            TREATMENT:   (In addition to Assessment/Re-Assessment sessions the following treatments were rendered)  Pre-treatment Symptoms/Complaints:  03/12/2017:  Patient reports he is feeling really good today.  Pain: Initial:   Pain Intensity 1: 3  Pain Location 1: Knee  Pain Orientation 1: Right   Pain Intervention(s) 1: Rest, Medication (see MAR)  Post Session:  2/10     THERAPEUTIC EXERCISE: (45 minutes):  Exercises per grid below to improve mobility, strength, balance and coordination.  Required minimal verbal and manual cues to promote proper body alignment, promote proper body posture and promote proper body mechanics.  Progressed resistance, range, repetitions and complexity of movement as indicated.   Date:  03/12/2017   Activity/Exercise  Parameters   Airdyne 7 minutes  Seat #7   Calf stretch on slant board 10 reps  5 second hold   Nautilus leg press 185 pounds  B LE     85 pounds  R LE   Nautilus leg curl 55 pounds  B LE     35 pounds  R LE   Nautilus leg extension 55 pounds  B LE     35 pounds  R LE   Marching in hallway 2 laps   Walking backward 4 laps   Standing toe raises 12 reps      MANUAL THERAPY: (0 minutes): Joint mobilization and patellar mobilization was utilized and necessary because of the patient's restricted joint motion, painful spasm, loss of articular motion and restricted motion of soft tissue.      MedBridge Portal    Treatment/Session Assessment:    ?? Response to Treatment: Patient tolerated treatment with decreased pain/stiffness after treatment.  Continue plan of care.    ?? Compliance with Program/Exercises: Compliant  ?? Recommendations/Intent for next treatment session: "Next visit will focus on advancements to more challenging activities".  Total Treatment Duration:  PT Patient Time In/Time Out  Time In: 0945  Time Out: [address] [place]

## 2017-03-14 NOTE — Progress Notes (Signed)
[facility]   Medical Office Building [address]  [address], [address] [place], [place] [id number]  Phone: [phone]   Fax: [phone]    OUTPATIENT DAILY NOTE    NAME/AGE/GENDER: Barton DuboisMichael L Corradi is a 61 y.o. male.     DATE: 03/14/2017    Patient cancelled his appointment for today due to being out of town.  Will plan to follow up on next scheduled visit.    Allene DillonJennifer N Susanna Benge, PT

## 2017-03-15 ENCOUNTER — Inpatient Hospital Stay: Payer: BLUE CROSS/BLUE SHIELD | Primary: Family Medicine

## 2017-03-19 ENCOUNTER — Inpatient Hospital Stay: Admit: 2017-03-19 | Payer: BLUE CROSS/BLUE SHIELD | Primary: Family Medicine

## 2017-03-19 NOTE — Progress Notes (Addendum)
Oletta Lamas  DOB: 28-Jan-1956  Primary: [facility] [place]  Secondary:  [facility]  [address], [address], [place]  Phone:[phone]   [phone]          OUTPATIENT PHYSICAL THERAPY:Daily Note 03/19/2017    ICD-10: Treatment Diagnosis:   ?? Pain in right knee (M25.561)  ?? Stiffness of right knee, not elsewhere classified (M25.661)  Precautions/Allergies:   Nsaids (non-steroidal anti-inflammatory drug)   Fall Risk Score: 1 (? 5 = High Risk)  MD Orders: Evaluate and Treat MEDICAL/REFERRING DIAGNOSIS:  S/p right knee scope; ACL reconstruction utilizing allograft  DATE OF ONSET: Surgery: 11/23/2016  REFERRING PHYSICIAN: Maryla Morrow., MD  RETURN PHYSICIAN APPOINTMENT: 12/05/2016     ASSESSMENT:  Mr. Imel presents with improving mobility, strength, and pain in right knee secondary to ACL repair.  Patient has attended a total of 14 scheduled physical therapy visits including initial evaluation on 11/28/2016.  Treatment has consisted of strengthening, stretching, manual therapy, and modalities to improve overall pain, mobility, and performance with activities of daily living.     PROBLEM LIST (Impacting functional limitations):  1. Decreased Strength  2. Decreased ADL/Functional Activities  3. Decreased Ambulation Ability/Technique  4. Decreased Balance  5. Increased Pain  6. Decreased Activity Tolerance INTERVENTIONS PLANNED:  1. Balance Exercise  2. Cold  3. Gait Training  4. Heat  5. Home Exercise Program (HEP)  6. Manual Therapy  7. Neuromuscular Re-education/Strengthening  8. Range of Motion (ROM)  9. Therapeutic Activites  10. Therapeutic Exercise/Strengthening   TREATMENT PLAN:  Effective Dates: 02/25/2017 TO 04/27/2017.  Frequency/Duration: 2 times a week for 8 weeks  GOALS: (Goals have been discussed and agreed upon with patient.)  Short-Term Functional Goals: Time Frame: 3 weeks   1. Patient will be independent with home exercise program without exacerbation of symptoms or cueing needed--goal met.   2. Patient will be independent with correct sleeping positions and awareness/avoidance of aggravating positions without cueing needed--goal met.   Discharge Goals: Time Frame: 12 weeks  1. Patient will be independent with all ADLs with minimal onset of right knee pain and no deficits with daily tasks--goal ongoing.  2. Patient will report no fear avoidance with social or recreational activities due to right knee pain--goal met.  3. Patient will score less than or equal to 50/80 on Lower Extremity Functional Scale with minimal effect of right knee pain on patient's ability to manage every day life activities--goal ongoing.  Rehabilitation Potential For Stated Goals: Good            The information in this section was collected on 02/22/2017 (except where otherwise noted).  HISTORY:   History of Present Injury/Illness (Reason for Referral):  Patient reports that he had surgery on Friday (12/24/2016).  He reports he has been doing well. He reports his pain is under control.  He reports he is ready to get back to walking and moving without fear of pain.    02/22/2017: Patient reports he is doing well.  He reports he is out walking more and trying to ride his bike.  He reports that he feels better overall.  Past Medical History/Comorbidities:   Mr. Leisinger  has a past medical history of Allergic rhinitis, cause unspecified; BMI 40.0-44.9, adult ([place]); Chronic left sacroiliac joint pain; Chronic radicular low back pain; Diverticulitis; Essential hypertension, benign; Generalized anxiety disorder; GERD (gastroesophageal reflux disease); Hypercholesterolemia; Left lumbar radiculitis; Lumbar stenosis with neurogenic claudication (  11/06/2015); Mood disorder ([place]) (07/14/2014); Morbid obesity ([place]); OSA on CPAP; Polycythemia (dx--06/2015); Prediabetes; Spondylolisthesis at L4-L5 level (10/19/2015); Testicular  hypofunction; Thrombophlebitis of left leg ([place]) (04/2015); Venous insufficiency of both lower extremities (07/14/2014); and Vitamin D deficiency. He also has no past medical history of Adverse effect of anesthesia; Difficult intubation; Malignant hyperthermia due to anesthesia; Nausea & vomiting; or Pseudocholinesterase deficiency.  Mr. Waren  has a past surgical history that includes endoscopy, colon, diagnostic (2011); hx colonoscopy (05/2015); hx knee arthroscopy (Bilateral, 04/2015); and hx orthopaedic (Left, 10/19/2015).  Social History/Living Environment:   Home Environment: Private residence  Living Alone: No  Support Systems: Family member(s), Spouse/Significant Other/Partner  Prior Level of Function/Work/Activity:  Independent  Dominant Side:         RIGHT  Personal Factors:          Sex:  male        Age:  61 y.o.  Current Medications:       Current Outpatient Prescriptions:   ???  doxycycline (ADOXA) 100 mg tablet, Take one table bid for 10 days, Disp: 20 Tab, Rfl: 0  ???  valsartan-hydroCHLOROthiazide (DIOVAN-HCT) 320-12.5 mg per tablet, TAKE 1 TABLET BY MOUTH EVERY DAY, Disp: 30 Tab, Rfl: 5  ???  pravastatin (PRAVACHOL) 40 mg tablet, TAKE 1 TABLET BY MOUTH AT BEDTIME, Disp: 30 Tab, Rfl: 5  ???  metoprolol succinate (TOPROL-XL) 100 mg tablet, Take 1 Tab by mouth nightly. Indications: hypertension, Disp: 30 Tab, Rfl: 5  ???  metFORMIN (GLUCOPHAGE) 1,000 mg tablet, TAKE ONE TABLET BY MOUTH TWICE A DAY, Disp: 60 Tab, Rfl: 5  ???  sildenafil citrate (VIAGRA) 100 mg tablet, Take 1 Tab by mouth as needed., Disp: 12 Tab, Rfl: 5  ???  OMEPRAZOLE PO, Take  by mouth as needed., Disp: , Rfl:   ???  diclofenac EC (VOLTAREN) 50 mg EC tablet, Take 1 Tab by mouth two (2) times a day. (Patient taking differently: Take 50 mg by mouth as needed.), Disp: 180 Tab, Rfl: 2  ???  aspirin (ASPIRIN LOW-STRENGTH) 81 mg chewable tablet, Take 81 mg by mouth every morning. Indications: continue, Disp: , Rfl:    ???  MULTI-VITAMIN HI-PO PO, Take 2 Tabs by mouth every morning. Indications: hold until after surgery, Disp: , Rfl:    Date Last Reviewed:  03/19/2017    Number of Personal Factors/Comorbidities that affect the Plan of Care: 1-2: MODERATE COMPLEXITY   EXAMINATION:   Observation/Orthostatic Postural Assessment:          Posture Assessment: Rounded shoulders, Forward head   Palpation:          Swelling noted in calf region with mild bruising noted behind right knee.  Scars and stitches are intact.    ROM:          R LE Assessment (AROM):   ?? Hip Flexion: 80 degrees    ?? Hip Extension: 0 degrees    ?? Hip Abduction: 10 degrees    ?? Hip Adduction: 0 degrees    ?? Knee Flexion: 100 degrees    ?? Knee Extension: 1 degrees   Strength:          R LE Assessment (Strength):   ?? Hip Flexion: 4/5 with manual muscle testing    ?? Hip Extension: 4/5 with manual muscle testing    ?? Hip Abduction: 4/5 with manual muscle testing    ?? Hip Adduction: 4/5 with manual muscle testing    ?? Knee Flexion: 4/5 with manual  muscle testing    ?? Knee Extension: 4/5 with manual muscle testing   Special Tests:          Not tested  Neurological Screen:        Dermatomes:  Within normal limits        Reflexes:  2+    Functional Mobility:         Gait/Ambulation:     Base of Support: Within normal limits  Speed/Cadence: Within normal limits  Step Length: Within normal limits  Swing Pattern: Left symmetrical, Right symmetrical  Stance: Within normal limits  Gait Abnormalities: Within normal limits  Ambulation - Level of Assistance: Independent  Assistive Device: None  ?? Interventions: Verbal cues, Safety awareness training          Transfers:     Sit to Stand: Independent  Stand to Sit: Independent  Stand Pivot Transfers: Independent  Bed to Chair: Modified independent  ?? Lateral Transfers: Independent          Bed Mobility:     Rolling: Independent  Supine to Sit: Independent  Sit to Supine: Independent  Scooting: Independent     Balance:          Intact    Body Structures Involved:  1. Joints  2. Muscles Body Functions Affected:  1. Neuromusculoskeletal  2. Movement Related Activities and Participation Affected:  1. Mobility  2. Self Care   Number of elements (examined above) that affect the Plan of Care: 4+: HIGH COMPLEXITY   CLINICAL PRESENTATION:   Presentation: Evolving clinical presentation with changing clinical characteristics: MODERATE COMPLEXITY   CLINICAL DECISION MAKING:   Outcome Measure:   Tool Used: Lower Extremity Functional Scale (LEFS)  Score:  Initial: 17/80 Most Recent: 50/80 (Date: 02/22/2017 )   Interpretation of Score: 20 questions each scored on a 5 point scale with 0 representing "extreme difficulty or unable to perform" and 4 representing "no difficulty".  The lower the score, the greater the functional disability. 80/80 represents no disability.  Minimal detectable change is 9 points.  Score 80 79-63 62-48 47-32 31-16 15-1 0   Modifier CH CI CJ CK CL CM CN     Medical Necessity:   ?? Patient is expected to demonstrate progress in strength, range of motion, balance and coordination to increase independence with daily activities.  ?? Patient demonstrates good rehab potential due to higher previous functional level.  ?? Skilled intervention continues to be required due to decreased mobility.  Reason for Services/Other Comments:  ?? Patient continues to demonstrate capacity to improve overall mobility which will increase independence and increase safety.   Use of outcome tool(s) and clinical judgement create a POC that gives a: Questionable prediction of patient's progress: MODERATE COMPLEXITY            TREATMENT:   (In addition to Assessment/Re-Assessment sessions the following treatments were rendered)  Pre-treatment Symptoms/Complaints:  03/19/2017:  Patient reports he is doing fine.  He reports he wears his brace less often now.  Pain: Initial:   Pain Intensity 1: 3  Pain Location 1: Knee  Pain Orientation 1: Right   Pain Intervention(s) 1: Rest, Medication (see MAR)  Post Session:  2/10     THERAPEUTIC EXERCISE: (35 minutes):  Exercises per grid below to improve mobility, strength, balance and coordination.  Required minimal verbal and manual cues to promote proper body alignment, promote proper body posture and promote proper body mechanics.  Progressed resistance, range, repetitions and complexity of movement as  indicated.   Date:  03/19/2017   Activity/Exercise Parameters   Airdyne 7 minutes  Seat #7   Calf stretch on slant board 10 reps  5 second hold   Nautilus leg press 185 pounds  B LE     85 pounds  R LE   Nautilus leg curl 55 pounds  B LE     35 pounds  R LE   Nautilus leg extension 55 pounds  B LE     35 pounds  R LE   Marching in hallway 2 laps   Walking backward 4 laps   Standing toe raises 12 reps      MANUAL THERAPY: (10 minutes): Joint mobilization and patellar mobilization was utilized and necessary because of the patient's restricted joint motion, painful spasm, loss of articular motion and restricted motion of soft tissue.      Med[facility] Portal    Treatment/Session Assessment:    ?? Response to Treatment: Patient tolerated treatment with decreased pain/stiffness after treatment.  Continue plan of care.    ?? Compliance with Program/Exercises: Compliant  ?? Recommendations/Intent for next treatment session: "Next visit will focus on advancements to more challenging activities".  Total Treatment Duration:  PT Patient Time In/Time Out  Time In: 0945  Time Out: [address], [place]

## 2017-03-21 ENCOUNTER — Inpatient Hospital Stay: Admit: 2017-03-21 | Payer: BLUE CROSS/BLUE SHIELD | Primary: Family Medicine

## 2017-03-21 NOTE — Other (Signed)
Steven [name]  DOB: Feb 19, 1956  Primary: [facility] [place] [facility]  Secondary:  [facility]  [address], [address], [place]  Phone:[phone]   [phone]          OUTPATIENT PHYSICAL THERAPY:Discontinuation Summary     ICD-10: Treatment Diagnosis:   ?? Pain in right knee (M25.561)  ?? Stiffness of right knee, not elsewhere classified (M25.661)  Precautions/Allergies:   Nsaids (non-steroidal anti-inflammatory drug)   Fall Risk Score: 1 (? 5 = High Risk)  MD Orders: Evaluate and Treat MEDICAL/REFERRING DIAGNOSIS:  S/p right knee scope; ACL reconstruction utilizing allograft  DATE OF ONSET: Surgery: 11/23/2016  REFERRING PHYSICIAN: Maryla Morrow., MD  RETURN PHYSICIAN APPOINTMENT: 12/05/2016     ASSESSMENT:  Mr. Abdallah presented with improving mobility, strength, and pain in right knee secondary to ACL repair.  Patient attended a total of 15 scheduled physical therapy visits including initial evaluation on 11/28/2016.  Treatment consisted of strengthening, stretching, manual therapy, and modalities to improve overall pain, mobility, and performance with activities of daily living.  Patient did not attend any additional physical therapy visits secondary to unknown reasons.  Patient's therapy will be discontinued at this time.  We will be happy to re-assess him with a change in his status and a new order from his doctor.  Thank you for the opportunity to serve this patient.    GOALS: (Goals have been discussed and agreed upon with patient.)  Short-Term Functional Goals: Time Frame: 3 weeks  1. Patient will be independent with home exercise program without exacerbation of symptoms or cueing needed--goal met.   2. Patient will be independent with correct sleeping positions and awareness/avoidance of aggravating positions without cueing needed--goal met.   Discharge Goals: Time Frame: 12 weeks   1. Patient will be independent with all ADLs with minimal onset of right knee pain and no deficits with daily tasks--goal met.  2. Patient will report no fear avoidance with social or recreational activities due to right knee pain--goal met.  3. Patient will score less than or equal to 50/80 on Lower Extremity Functional Scale with minimal effect of right knee pain on patient's ability to manage every day life activities--goal unmet.            The information in this section was collected on 02/22/2017 (except where otherwise noted).  EXAMINATION:   Observation/Orthostatic Postural Assessment:          Posture Assessment: Rounded shoulders, Forward head   Palpation:          Swelling noted in calf region with mild bruising noted behind right knee.  Scars and stitches are intact.    ROM:          R LE Assessment (AROM):   ?? Hip Flexion: 80 degrees    ?? Hip Extension: 0 degrees    ?? Hip Abduction: 10 degrees    ?? Hip Adduction: 0 degrees    ?? Knee Flexion: 100 degrees    ?? Knee Extension: 1 degrees   Strength:          R LE Assessment (Strength):   ?? Hip Flexion: 4/5 with manual muscle testing    ?? Hip Extension: 4/5 with manual muscle testing    ?? Hip Abduction: 4/5 with manual muscle testing    ?? Hip Adduction: 4/5 with manual muscle testing    ?? Knee Flexion: 4/5 with manual muscle testing    ??  Knee Extension: 4/5 with manual muscle testing   Special Tests:          Not tested  Neurological Screen:        Dermatomes:  Within normal limits        Reflexes:  2+    Functional Mobility:         Gait/Ambulation:     Base of Support: Within normal limits  Speed/Cadence: Within normal limits  Step Length: Within normal limits  Swing Pattern: Left symmetrical, Right symmetrical  Stance: Within normal limits  Gait Abnormalities: Within normal limits  Ambulation - Level of Assistance: Independent  Assistive Device: None  ?? Interventions: Verbal cues, Safety awareness training          Transfers:      Sit to Stand: Independent  Stand to Sit: Independent  Stand Pivot Transfers: Independent  Bed to Chair: Modified independent  ?? Lateral Transfers: Independent          Bed Mobility:     Rolling: Independent  Supine to Sit: Independent  Sit to Supine: Independent  Scooting: Independent     Balance:          Intact   CLINICAL DECISION MAKING:   Outcome Measure:   Tool Used: Lower Extremity Functional Scale (LEFS)  Score:  Initial: 17/80 Most Recent: 50/80 (Date: 02/22/2017 )   Interpretation of Score: 20 questions each scored on a 5 point scale with 0 representing "extreme difficulty or unable to perform" and 4 representing "no difficulty".  The lower the score, the greater the functional disability. 80/80 represents no disability.  Minimal detectable change is 9 points.  Score 80 79-63 62-48 47-32 31-16 15-1 0   Modifier CH CI CJ CK CL CM CN ??             [name], PT

## 2017-03-21 NOTE — Progress Notes (Signed)
Steven [name]  DOB: 1956/02/10  Primary: [facility]  Secondary:  [facility]  [address], [address], [place]  [phone]   [phone]          OUTPATIENT PHYSICAL THERAPY:Progress Report 03/21/2017    ICD-10: Treatment Diagnosis:   ?? Pain in right knee (M25.561)  ?? Stiffness of right knee, not elsewhere classified (M25.661)  Precautions/Allergies:   Nsaids (non-steroidal anti-inflammatory drug)   Fall Risk Score: 1 (? 5 = High Risk)  MD Orders: Evaluate and Treat MEDICAL/REFERRING DIAGNOSIS:  S/p right knee scope; ACL reconstruction utilizing allograft  DATE OF ONSET: Surgery: 11/23/2016  REFERRING PHYSICIAN: Maryla Morrow., MD  RETURN PHYSICIAN APPOINTMENT: 12/05/2016     ASSESSMENT:  [name] presents with improving mobility, strength, and pain in right knee secondary to ACL repair.  Patient has attended a total of 15 scheduled physical therapy visits including initial evaluation on 11/28/2016.  Treatment has consisted of strengthening, stretching, manual therapy, and modalities to improve overall pain, mobility, and performance with activities of daily living.     PROBLEM LIST (Impacting functional limitations):  1. Decreased Strength  2. Decreased ADL/Functional Activities  3. Decreased Ambulation Ability/Technique  4. Decreased Balance  5. Increased Pain  6. Decreased Activity Tolerance INTERVENTIONS PLANNED:  1. Balance Exercise  2. Cold  3. Gait Training  4. Heat  5. Home Exercise Program (HEP)  6. Manual Therapy  7. Neuromuscular Re-education/Strengthening  8. Range of Motion (ROM)  9. Therapeutic Activites  10. Therapeutic Exercise/Strengthening   TREATMENT PLAN:  Effective Dates: 02/25/2017 TO 04/27/2017.  Frequency/Duration: 2 times a week for 8 weeks  GOALS: (Goals have been discussed and agreed upon with patient.)  Short-Term Functional Goals: Time Frame: 3 weeks   1. Patient will be independent with home exercise program without exacerbation of symptoms or cueing needed--goal met.   2. Patient will be independent with correct sleeping positions and awareness/avoidance of aggravating positions without cueing needed--goal met.   Discharge Goals: Time Frame: 12 weeks  1. Patient will be independent with all ADLs with minimal onset of right knee pain and no deficits with daily tasks--goal met.  2. Patient will report no fear avoidance with social or recreational activities due to right knee pain--goal met.  3. Patient will score less than or equal to 50/80 on Lower Extremity Functional Scale with minimal effect of right knee pain on patient's ability to manage every day life activities--goal ongoing.  Rehabilitation Potential For Stated Goals: Good            The information in this section was collected on 02/22/2017 (except where otherwise noted).  HISTORY:   History of Present Injury/Illness (Reason for Referral):  Patient reports that he had surgery on Friday (12/24/2016).  He reports he has been doing well. He reports his pain is under control.  He reports he is ready to get back to walking and moving without fear of pain.    02/22/2017: Patient reports he is doing well.  He reports he is out walking more and trying to ride his bike.  He reports that he feels better overall.  Past Medical History/Comorbidities:   [name]  has a past medical history of Allergic rhinitis, cause unspecified; BMI 40.0-44.9, adult ([place]); Chronic left sacroiliac joint pain; Chronic radicular low back pain; Diverticulitis; Essential hypertension, benign; Generalized anxiety disorder; GERD (gastroesophageal reflux disease); Hypercholesterolemia; Left lumbar radiculitis; Lumbar stenosis with neurogenic claudication (  11/06/2015); Mood disorder ([place]) (07/14/2014); Morbid obesity ([place]); OSA on CPAP; Polycythemia (dx--06/2015); Prediabetes; Spondylolisthesis at L4-L5 level (10/19/2015); Testicular  hypofunction; Thrombophlebitis of left leg ([place]) (04/2015); Venous insufficiency of both lower extremities (07/14/2014); and Vitamin D deficiency. He also has no past medical history of Adverse effect of anesthesia; Difficult intubation; Malignant hyperthermia due to anesthesia; Nausea & vomiting; or Pseudocholinesterase deficiency.  Mr. Bradt  has a past surgical history that includes endoscopy, colon, diagnostic (2011); hx colonoscopy (05/2015); hx knee arthroscopy (Bilateral, 04/2015); and hx orthopaedic (Left, 10/19/2015).  Social History/Living Environment:   Home Environment: Private residence  Living Alone: No  Support Systems: Family member(s), Spouse/Significant Other/Partner  Prior Level of Function/Work/Activity:  Independent  Dominant Side:         RIGHT  Personal Factors:          Sex:  male        Age:  61 y.o.  Current Medications:       Current Outpatient Prescriptions:   ???  doxycycline (ADOXA) 100 mg tablet, Take one table bid for 10 days, Disp: 20 Tab, Rfl: 0  ???  valsartan-hydroCHLOROthiazide (DIOVAN-HCT) 320-12.5 mg per tablet, TAKE 1 TABLET BY MOUTH EVERY DAY, Disp: 30 Tab, Rfl: 5  ???  pravastatin (PRAVACHOL) 40 mg tablet, TAKE 1 TABLET BY MOUTH AT BEDTIME, Disp: 30 Tab, Rfl: 5  ???  metoprolol succinate (TOPROL-XL) 100 mg tablet, Take 1 Tab by mouth nightly. Indications: hypertension, Disp: 30 Tab, Rfl: 5  ???  metFORMIN (GLUCOPHAGE) 1,000 mg tablet, TAKE ONE TABLET BY MOUTH TWICE A DAY, Disp: 60 Tab, Rfl: 5  ???  sildenafil citrate (VIAGRA) 100 mg tablet, Take 1 Tab by mouth as needed., Disp: 12 Tab, Rfl: 5  ???  OMEPRAZOLE PO, Take  by mouth as needed., Disp: , Rfl:   ???  diclofenac EC (VOLTAREN) 50 mg EC tablet, Take 1 Tab by mouth two (2) times a day. (Patient taking differently: Take 50 mg by mouth as needed.), Disp: 180 Tab, Rfl: 2  ???  aspirin (ASPIRIN LOW-STRENGTH) 81 mg chewable tablet, Take 81 mg by mouth every morning. Indications: continue, Disp: , Rfl:    ???  MULTI-VITAMIN HI-PO PO, Take 2 Tabs by mouth every morning. Indications: hold until after surgery, Disp: , Rfl:    Date Last Reviewed:  03/21/2017    Number of Personal Factors/Comorbidities that affect the Plan of Care: 1-2: MODERATE COMPLEXITY   EXAMINATION:   Observation/Orthostatic Postural Assessment:          Posture Assessment: Rounded shoulders, Forward head   Palpation:          Swelling noted in calf region with mild bruising noted behind right knee.  Scars and stitches are intact.    ROM:          R LE Assessment (AROM):   ?? Hip Flexion: 80 degrees    ?? Hip Extension: 0 degrees    ?? Hip Abduction: 10 degrees    ?? Hip Adduction: 0 degrees    ?? Knee Flexion: 100 degrees    ?? Knee Extension: 1 degrees   Strength:          R LE Assessment (Strength):   ?? Hip Flexion: 4/5 with manual muscle testing    ?? Hip Extension: 4/5 with manual muscle testing    ?? Hip Abduction: 4/5 with manual muscle testing    ?? Hip Adduction: 4/5 with manual muscle testing    ?? Knee Flexion: 4/5 with manual  muscle testing    ?? Knee Extension: 4/5 with manual muscle testing   Special Tests:          Not tested  Neurological Screen:        Dermatomes:  Within normal limits        Reflexes:  2+    Functional Mobility:         Gait/Ambulation:     Base of Support: Within normal limits  Speed/Cadence: Within normal limits  Step Length: Within normal limits  Swing Pattern: Left symmetrical, Right symmetrical  Stance: Within normal limits  Gait Abnormalities: Within normal limits  Ambulation - Level of Assistance: Independent  Assistive Device: None  ?? Interventions: Verbal cues, Safety awareness training          Transfers:     Sit to Stand: Independent  Stand to Sit: Independent  Stand Pivot Transfers: Independent  Bed to Chair: Modified independent  ?? Lateral Transfers: Independent          Bed Mobility:     Rolling: Independent  Supine to Sit: Independent  Sit to Supine: Independent  Scooting: Independent     Balance:          Intact    Body Structures Involved:  1. Joints  2. Muscles Body Functions Affected:  1. Neuromusculoskeletal  2. Movement Related Activities and Participation Affected:  1. Mobility  2. Self Care   Number of elements (examined above) that affect the Plan of Care: 4+: HIGH COMPLEXITY   CLINICAL PRESENTATION:   Presentation: Evolving clinical presentation with changing clinical characteristics: MODERATE COMPLEXITY   CLINICAL DECISION MAKING:   Outcome Measure:   Tool Used: Lower Extremity Functional Scale (LEFS)  Score:  Initial: 17/80 Most Recent: 50/80 (Date: 02/22/2017 )   Interpretation of Score: 20 questions each scored on a 5 point scale with 0 representing "extreme difficulty or unable to perform" and 4 representing "no difficulty".  The lower the score, the greater the functional disability. 80/80 represents no disability.  Minimal detectable change is 9 points.  Score 80 79-63 62-48 47-32 31-16 15-1 0   Modifier CH CI CJ CK CL CM CN     Medical Necessity:   ?? Patient is expected to demonstrate progress in strength, range of motion, balance and coordination to increase independence with daily activities.  ?? Patient demonstrates good rehab potential due to higher previous functional level.  ?? Skilled intervention continues to be required due to decreased mobility.  Reason for Services/Other Comments:  ?? Patient continues to demonstrate capacity to improve overall mobility which will increase independence and increase safety.   Use of outcome tool(s) and clinical judgement create a POC that gives a: Questionable prediction of patient's progress: MODERATE COMPLEXITY            TREATMENT:   (In addition to Assessment/Re-Assessment sessions the following treatments were rendered)  Pre-treatment Symptoms/Complaints:  03/21/2017:  Patient reports he is still feeling really good. Not much stiffness today.  Pain: Initial:   Pain Intensity 1: 3  Pain Location 1: Knee  Pain Orientation 1: Right   Pain Intervention(s) 1: Rest, Medication (see MAR)  Post Session:  2/10     THERAPEUTIC EXERCISE: (35 minutes):  Exercises per grid below to improve mobility, strength, balance and coordination.  Required minimal verbal and manual cues to promote proper body alignment, promote proper body posture and promote proper body mechanics.  Progressed resistance, range, repetitions and complexity of movement as indicated.   Date:  03/21/2017   Activity/Exercise Parameters   [place]yne 7 minutes  Seat #7   Calf stretch on slant board 10 reps  5 second hold   Nautilus leg press 185 pounds  B LE     85 pounds  R LE   Nautilus leg curl 55 pounds  B LE     35 pounds  R LE   Nautilus leg extension 55 pounds  B LE     35 pounds  R LE   Marching in hallway 2 laps   Walking backward 4 laps   Standing toe raises 12 reps      MANUAL THERAPY: (10 minutes): Joint mobilization - pushing into extension with towel under ankle -  was utilized and necessary because of the patient's restricted joint motion, painful spasm, loss of articular motion and restricted motion of soft tissue.      [facility] Portal    Treatment/Session Assessment:    ?? Response to Treatment: Patient tolerated treatment with decreased pain/stiffness after treatment.  Continue plan of care.    ?? Compliance with Program/Exercises: Compliant  ?? Recommendations/Intent for next treatment session: "Next visit will focus on advancements to more challenging activities".  Total Treatment Duration:  PT Patient Time In/Time Out  Time In: 1345  Time Out: [place], PT

## 2017-05-15 ENCOUNTER — Ambulatory Visit
Admit: 2017-05-15 | Discharge: 2017-05-15 | Payer: BLUE CROSS/BLUE SHIELD | Attending: Family Medicine | Primary: Family Medicine

## 2017-05-15 DIAGNOSIS — L02219 Cutaneous abscess of trunk, unspecified: Secondary | ICD-10-CM

## 2017-05-15 MED ORDER — SILDENAFIL 100 MG TAB
100 mg | ORAL_TABLET | ORAL | 5 refills | Status: DC | PRN
Start: 2017-05-15 — End: 2017-10-10

## 2017-05-15 MED ORDER — OLMESARTAN-HYDROCHLOROTHIAZIDE 40 MG-12.5 MG TAB
ORAL_TABLET | Freq: Every day | ORAL | 0 refills | Status: DC
Start: 2017-05-15 — End: 2017-08-22

## 2017-05-15 MED ORDER — PRAVASTATIN 40 MG TAB
40 mg | ORAL_TABLET | ORAL | 1 refills | Status: DC
Start: 2017-05-15 — End: 2017-10-10

## 2017-05-15 MED ORDER — METOPROLOL SUCCINATE SR 100 MG 24 HR TAB
100 mg | ORAL_TABLET | Freq: Every evening | ORAL | 1 refills | Status: DC
Start: 2017-05-15 — End: 2017-10-10

## 2017-05-15 MED ORDER — METFORMIN 1,000 MG TAB
1000 mg | ORAL_TABLET | ORAL | 1 refills | Status: DC
Start: 2017-05-15 — End: 2017-10-10

## 2017-05-15 MED ORDER — DULOXETINE 30 MG CAP, DELAYED RELEASE
30 mg | ORAL_CAPSULE | Freq: Every day | ORAL | 1 refills | Status: DC
Start: 2017-05-15 — End: 2017-10-10

## 2017-05-15 MED ORDER — DOXYCYCLINE 100 MG TAB
100 mg | ORAL_TABLET | ORAL | 0 refills | Status: DC
Start: 2017-05-15 — End: 2017-07-04

## 2017-05-15 NOTE — Progress Notes (Signed)
HISTORY OF PRESENT ILLNESS  Steven [name] is a 61 y.o. male.  Insect Bite   The history is provided by the patient. This is a new problem. The current episode started more than 2 days ago. The problem occurs constantly. Pertinent negatives include no chest pain, no abdominal pain, no headaches and no shortness of breath.   Medication Evaluation   Pertinent negatives include no chest pain, no abdominal pain, no headaches and no shortness of breath.      Left side of his trunk has a bug bite Steven [name] thinks - walks in woods a lot. Found a tick on his other side and got it off right away. Sometimes has ants in pool too. Been there for 1 month, nothing comes out. Getting bigger. No fevers.    Bottom of feet - might have neuropathy - progressively worse. Used to be on cymbalta and it really helped with sciatica. Stayed on it about 2-3 mos. Steven [name] would like to try it again.    Chronic f/u HTN, prediabetes, HLP, anxiety: doing well on current meds, is not fasting today but needs to get labs and f/u done b/c will be out of town in august when due.      Review of Systems   Constitutional: Negative for chills, fever and malaise/fatigue.   HENT: Negative for congestion, ear pain and sore throat.    Eyes: Negative for blurred vision, pain and redness.   Respiratory: Negative for cough and shortness of breath.    Cardiovascular: Negative for chest pain and palpitations.   Gastrointestinal: Negative for abdominal pain, nausea and vomiting.   Genitourinary: Negative for dysuria.   Musculoskeletal: Positive for back pain. Negative for myalgias.   Skin: Positive for rash.   Neurological: Negative for dizziness and headaches.   Psychiatric/Behavioral: Negative for depression.     Visit Vitals   ??? BP 118/58 (BP 1 Location: Right arm, BP Patient Position: Sitting)   ??? Pulse 89   ??? Temp 98.2 ??F (36.8 ??C) (Oral)   ??? Ht 5\' 11"  (1.803 m)   ??? Wt 263 lb (119.3 kg)   ??? SpO2 99%   ??? BMI 36.68 kg/m2        Physical Exam    Constitutional: Steven [name] is oriented to person, place, and time. Steven [name] appears well-developed and well-nourished. Steven [name] is cooperative. No distress.   HENT:   Head: Normocephalic and atraumatic.   Pulmonary/Chest: Effort normal. No respiratory distress.   Neurological: Steven [name] is alert and oriented to person, place, and time. Coordination and gait normal.   Skin: No rash noted. Steven [name] is not diaphoretic. No pallor.        Psychiatric: Steven [name] has a normal mood and affect. His speech is normal and behavior is normal. Judgment and thought content normal. His mood appears not anxious. Cognition and memory are normal. Steven [name] does not exhibit a depressed mood.       ASSESSMENT and PLAN    ICD-10-CM ICD-9-CM    1. Cellulitis and abscess of trunk L03.319 682.2 doxycycline (ADOXA) 100 mg tablet    L02.219     2. Essential hypertension, benign I10 401.1 metoprolol succinate (TOPROL-XL) 100 mg tablet      olmesartan-hydroCHLOROthiazide (BENICAR HCT) 40-12.5 mg per tablet      CBC WITH AUTOMATED DIFF      METABOLIC PANEL, COMPREHENSIVE      LIPID PANEL   3. Pure hypercholesterolemia E78.00 272.0 pravastatin (PRAVACHOL) 40 mg tablet   4. Erectile dysfunction, unspecified erectile  dysfunction type N52.9 607.84 sildenafil citrate (VIAGRA) 100 mg tablet   5. Pre-diabetes R73.03 790.29 metFORMIN (GLUCOPHAGE) 1,000 mg tablet      HEMOGLOBIN A1C WITH EAG   6. Chronic radicular low back pain M54.16 724.4 DULoxetine (CYMBALTA) 30 mg capsule    G89.29 338.29    skin infection - tx with doxy, no drainage needed at this time. RTC if not improving.   Check labs, adjust meds if needed.  Steven [name] will return for fasting labs next week and then adjust meds if needed.  Temporary change in BP med due to valsartan recall. Change back once available.  Trial of cymbalta to help with back pain, possible nerve pain. RTC 3 mos to recheck, if not improving consider nerve conduction study.  25 minutes spent counseling patient today.

## 2017-05-20 ENCOUNTER — Encounter: Admit: 2017-05-20 | Discharge: 2017-05-20 | Payer: BLUE CROSS/BLUE SHIELD | Primary: Family Medicine

## 2017-05-20 DIAGNOSIS — I10 Essential (primary) hypertension: Secondary | ICD-10-CM

## 2017-05-21 LAB — METABOLIC PANEL, COMPREHENSIVE
A-G Ratio: 1.7 (ref 1.2–2.2)
ALT (SGPT): 35 IU/L (ref 0–44)
AST (SGOT): 28 IU/L (ref 0–40)
Albumin: 4.6 g/dL (ref 3.5–5.5)
Alk. phosphatase: 72 IU/L (ref 39–117)
BUN/Creatinine ratio: 28 — ABNORMAL HIGH (ref 9–20)
BUN: 36 mg/dL — ABNORMAL HIGH (ref 6–24)
Bilirubin, total: 0.3 mg/dL (ref 0.0–1.2)
CO2: 18 mmol/L — ABNORMAL LOW (ref 20–29)
Calcium: 9.4 mg/dL (ref 8.7–10.2)
Chloride: 104 mmol/L (ref 96–106)
Creatinine: 1.3 mg/dL — ABNORMAL HIGH (ref 0.76–1.27)
GFR est AA: 69 mL/min/{1.73_m2} (ref >59)
GFR est non-AA: 60 mL/min/{1.73_m2} (ref >59)
GLOBULIN, TOTAL: 2.7 g/dL (ref 1.5–4.5)
Glucose: 116 mg/dL — ABNORMAL HIGH (ref 65–99)
Potassium: 5.2 mmol/L (ref 3.5–5.2)
Protein, total: 7.3 g/dL (ref 6.0–8.5)
Sodium: 139 mmol/L (ref 134–144)

## 2017-05-21 LAB — CBC WITH AUTOMATED DIFF
ABS. BASOPHILS: 0 10*3/uL (ref 0.0–0.2)
ABS. EOSINOPHILS: 0.1 10*3/uL (ref 0.0–0.4)
ABS. IMM. GRANS.: 0 10*3/uL (ref 0.0–0.1)
ABS. MONOCYTES: 0.5 10*3/uL (ref 0.1–0.9)
ABS. NEUTROPHILS: 3.4 10*3/uL (ref 1.4–7.0)
Abs Lymphocytes: 1.8 10*3/uL (ref 0.7–3.1)
BASOPHILS: 1 %
EOSINOPHILS: 1 %
HCT: 43.2 % (ref 37.5–51.0)
HGB: 15.3 g/dL (ref 13.0–17.7)
IMMATURE GRANULOCYTES: 0 %
Lymphocytes: 31 %
MCH: 33.1 pg — ABNORMAL HIGH (ref 26.6–33.0)
MCHC: 35.4 g/dL (ref 31.5–35.7)
MCV: 94 fL (ref 79–97)
MONOCYTES: 9 %
NEUTROPHILS: 58 %
PLATELET: 187 10*3/uL (ref 150–379)
RBC: 4.62 x10E6/uL (ref 4.14–5.80)
RDW: 14 % (ref 12.3–15.4)
WBC: 5.7 10*3/uL (ref 3.4–10.8)

## 2017-05-21 LAB — HEMOGLOBIN A1C WITH EAG
Estimated average glucose: 120 mg/dL
Hemoglobin A1c: 5.8 % — ABNORMAL HIGH (ref 4.8–5.6)

## 2017-05-21 LAB — LIPID PANEL
Cholesterol, total: 152 mg/dL (ref 100–199)
HDL Cholesterol: 32 mg/dL — ABNORMAL LOW (ref >39)
LDL, calculated: 85 mg/dL (ref 0–99)
Triglyceride: 177 mg/dL — ABNORMAL HIGH (ref 0–149)
VLDL, calculated: 35 mg/dL (ref 5–40)

## 2017-05-23 ENCOUNTER — Encounter: Primary: Family Medicine

## 2017-05-23 ENCOUNTER — Encounter: Attending: Hematology & Oncology | Primary: Family Medicine

## 2017-05-29 ENCOUNTER — Encounter: Attending: Family Medicine | Primary: Family Medicine

## 2017-07-04 ENCOUNTER — Inpatient Hospital Stay: Admit: 2017-07-04 | Payer: BLUE CROSS/BLUE SHIELD | Primary: Family Medicine

## 2017-07-04 ENCOUNTER — Ambulatory Visit
Admit: 2017-07-04 | Discharge: 2017-07-04 | Payer: BLUE CROSS/BLUE SHIELD | Attending: Hematology & Oncology | Primary: Family Medicine

## 2017-07-04 DIAGNOSIS — D751 Secondary polycythemia: Secondary | ICD-10-CM

## 2017-07-04 LAB — CBC WITH AUTOMATED DIFF
ABS. BASOPHILS: 0 10*3/uL (ref 0.0–0.2)
ABS. EOSINOPHILS: 0.1 10*3/uL (ref 0.0–0.8)
ABS. IMM. GRANS.: 0 10*3/uL (ref 0.0–0.5)
ABS. LYMPHOCYTES: 1.3 10*3/uL (ref 0.5–4.6)
ABS. MONOCYTES: 0.4 10*3/uL (ref 0.1–1.3)
ABS. NEUTROPHILS: 3 10*3/uL (ref 1.7–8.2)
ABSOLUTE NRBC: 0.01 10*3/uL (ref 0.0–0.2)
BASOPHILS: 1 % (ref 0.0–2.0)
EOSINOPHILS: 2 % (ref 0.5–7.8)
HCT: 43.2 % (ref 41.1–50.3)
HGB: 15 g/dL (ref 13.6–17.2)
IMMATURE GRANULOCYTES: 0 % (ref 0.0–5.0)
LYMPHOCYTES: 28 % (ref 13–44)
MCH: 32.3 PG (ref 26.1–32.9)
MCHC: 34.7 g/dL (ref 31.4–35.0)
MCV: 92.9 FL (ref 79.6–97.8)
MONOCYTES: 8 % (ref 4.0–12.0)
MPV: 11.2 FL (ref 9.4–12.3)
NEUTROPHILS: 61 % (ref 43–78)
PLATELET: 125 10*3/uL — ABNORMAL LOW (ref 150–450)
RBC: 4.65 M/uL (ref 4.23–5.67)
RDW: 12.9 % (ref 11.9–14.6)
WBC: 4.8 10*3/uL (ref 4.3–11.1)

## 2017-07-04 LAB — METABOLIC PANEL, COMPREHENSIVE
A-G Ratio: 1.3 (ref 1.2–3.5)
ALT (SGPT): 38 U/L (ref 12–65)
AST (SGOT): 24 U/L (ref 15–37)
Albumin: 4.1 g/dL (ref 3.2–4.6)
Alk. phosphatase: 80 U/L (ref 50–136)
Anion gap: 11 mmol/L (ref 7–16)
BUN: 25 MG/DL — ABNORMAL HIGH (ref 8–23)
Bilirubin, total: 0.4 MG/DL (ref 0.2–1.1)
CO2: 19 mmol/L — ABNORMAL LOW (ref 21–32)
Calcium: 9.1 MG/DL (ref 8.3–10.4)
Chloride: 107 mmol/L (ref 98–107)
Creatinine: 1.32 MG/DL (ref 0.8–1.5)
GFR est AA: >60 mL/min/{1.73_m2} (ref >60)
GFR est non-AA: 59 mL/min/{1.73_m2} — ABNORMAL LOW (ref >60)
Globulin: 3.2 g/dL (ref 2.3–3.5)
Glucose: 132 mg/dL — ABNORMAL HIGH (ref 65–100)
Potassium: 4.5 mmol/L (ref 3.5–5.1)
Protein, total: 7.3 g/dL (ref 6.3–8.2)
Sodium: 137 mmol/L (ref 136–145)

## 2017-07-04 NOTE — Progress Notes (Signed)
Data Source: Patient, [facility] record.    07/04/2017    10:23 AM    Steven [name] [id number]    61 y.o.      Patient Encounter: [facility] Visit    Heme Diagnosis:  polycythemia  Heme History (Copied from prior):   39 male, non-smoker, commercial sign business owner, h/o obesity, b/l knee arthroscopy (6/16), vit D deficiency, GERD, diverticulitis, allergic rhinitis, lower back pain related to herniated disc 's with plans to undergo laminectomy & fusion. Surgery was deferred related to insurance approval however was also found to have polycythemia, and as such referred to [place]. Review of labs reveals Hgb 18-19 range since 9/13. Hct 55-61 range. Other cell lines are normal. More recently has had some increase in his Cr 1.4-1.6. Recently seen by pulmonology, and diagnosed w moderate sleep apnea, and now on CPAP x 1 month, reports being regular w/ it as makes him sleep better. Notes chronic LE swelling x 3 yeas. Notes having had a detailed cardiac evaluation and was unremarkable.   Interval History:  09/18: Doing well. Enjoyed recent trip to [place], was regular w compression stockings during travelling. Off testosterone, Hgb/hct normal range. Complaint w his CPAP. Continue simple monitoring.   NCCN Distress Score:  -  REVIEW OF SYSTEMS:  As mentioned above, all other systems were reviewed in full and are negative.    Past Medical History:   Diagnosis Date   ??? Allergic rhinitis, cause unspecified    ??? BMI 40.0-44.9, adult ([place])    ??? Chronic left sacroiliac joint pain    ??? Chronic radicular low back pain         ??? Diverticulitis    ??? Essential hypertension, benign    ??? Generalized anxiety disorder    ??? GERD (gastroesophageal reflux disease)     occ no meds   ??? Hypercholesterolemia    ??? Left lumbar radiculitis     He has had low back pain that radiates down his LLE for years.   ??? 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 Mood disorder ([place]) 07/14/2014   ??? Morbid obesity ([place])      bmi = 40   ??? OSA on CPAP     wears cpap at hs   ??? Polycythemia dx--06/2015    followed by Dr Faith Rogue (heme-onc); "Advised to consider stopping testosterone replacement. Serial monitoring of hct: phlebotomy only if hct over 65%" per office note 10/10/16. Hgb 18-19 since 06/2012.   ??? Prediabetes     does not check BG at home, metformin daily, last hgba1c- 6.5 (05/2016)   ??? Spondylolisthesis at L4-L5 level 10/19/2015   ??? Testicular hypofunction    ??? Thrombophlebitis of left leg (HCC) 04/2015    posterior tibial vein and peroneal vein   ??? Venous insufficiency of both lower extremities 07/14/2014   ??? Vitamin D deficiency             Past Surgical History:   Procedure Laterality Date   ??? ENDOSCOPY, COLON, DIAGNOSTIC  2011    Diverticulosis   ??? HX COLONOSCOPY  05/2015    Diverticulosis   ??? HX KNEE ARTHROSCOPY Bilateral 04/2015    Partial medial and lateral meniscectomies by Dr. Eliseo Squires   ??? HX ORTHOPAEDIC Left 10/19/2015    L4-5 TLIF, decompression. Dr. Raul Del       Current Outpatient Prescriptions   Medication Sig Dispense Refill   ??? pravastatin (PRAVACHOL) 40 mg tablet TAKE 1 TABLET BY  MOUTH AT BEDTIME 90 Tab 1   ??? metoprolol succinate (TOPROL-XL) 100 mg tablet Take 1 Tab by mouth nightly. Indications: hypertension 90 Tab 1   ??? metFORMIN (GLUCOPHAGE) 1,000 mg tablet TAKE ONE TABLET BY MOUTH TWICE A DAY 180 Tab 1   ??? olmesartan-hydroCHLOROthiazide (BENICAR HCT) 40-12.5 mg per tablet Take 1 Tab by mouth daily. 90 Tab 0   ??? DULoxetine (CYMBALTA) 30 mg capsule Take 1 Cap by mouth daily. 90 Cap 1   ??? OMEPRAZOLE PO Take  by mouth as needed.     ??? diclofenac EC (VOLTAREN) 50 mg EC tablet Take 1 Tab by mouth two (2) times a day. (Patient taking differently: Take 50 mg by mouth as needed.) 180 Tab 2   ??? aspirin (ASPIRIN LOW-STRENGTH) 81 mg chewable tablet Take 81 mg by mouth every morning. Indications: continue     ??? MULTI-VITAMIN HI-PO PO Take 2 Tabs by mouth every morning. Indications: hold until after surgery      ??? sildenafil citrate (VIAGRA) 100 mg tablet Take 1 Tab by mouth as needed. 12 Tab 5       Social History     Social History   ??? Marital status: MARRIED     Spouse name: N/A   ??? Number of children: N/A   ??? Years of education: N/A     Social History Main Topics   ??? Smoking status: Never Smoker   ??? Smokeless tobacco: Never Used   ??? Alcohol use 0.6 oz/week     1 Cans of beer per week      Comment: social   ??? Drug use: No   ??? Sexual activity: Not Asked     Other Topics Concern   ??? None     Social History Narrative       Family History   Problem Relation Age of Onset   ??? Diabetes Mother    ??? Alzheimer Mother    ??? Heart Disease Father      atherosclerosis (heavy smoker)   ??? Diabetes Sister        Allergies   Allergen Reactions   ??? Nsaids (Non-Steroidal Anti-Inflammatory Drug) Other (comments)     Cause pedal edema if taken for a number of days in a row       PHYSICAL EXAMINATION:  General Appearance: Healthy appearing patient in no acute distress  Vitals reviewed.   Visit Vitals   ??? BP 113/71 (BP 1 Location: Left arm, BP Patient Position: Standing)   ??? Pulse 72   ??? Temp 98 ??F (36.7 ??C) (Oral)   ??? Resp 18   ??? Ht 5' 11"  (1.803 m)   ??? Wt 259 lb 8 oz (117.7 kg)   ??? SpO2 97%   ??? BMI 36.19 kg/m2     HEENT: No oral or pharyngeal masses, ulceration or thrush noted, no sinus tenderness.  Neck is supple with no thyromegaly or JVD noted.  Lymph Nodes: No lymphadenopathy noted in the occipital, pre and post auricular, cervical, supra and infraclavicular, axillary, epitrochlear, inguinal, and popliteal region.  Breasts: No palpable masses, nipple discharge or skin retraction  Lungs/Thorax: Clear to auscultation, no accessory muscles of respiration being used.  Heart: Regular rate and rhythm, normal S1, S2, no appreciable murmurs, rubs, gallops  Abdomen: Soft, nontender, bowel sounds present, no appreciable hepatosplenomegaly, no palpable masses  Extremeties: Good pulses bilaterally, no peripheral edema.   Skin: Normal skin tone with no rash, petechiae, ecchymosis noted.  Musculoskeletal: No pain  on palpation over bony prominence, no edema, no evidence of gout, no joint or bony deformity  Neurologic: Grossly intact        LABS/IMAGING:    Lab Results   Component Value Date/Time    WBC 4.8 07/04/2017 11:01 AM    HGB 15.0 07/04/2017 11:01 AM    HCT 43.2 07/04/2017 11:01 AM    PLATELET 125 (L) 07/04/2017 11:01 AM    MCV 92.9 07/04/2017 11:01 AM       Lab Results   Component Value Date/Time    Sodium 137 07/04/2017 11:01 AM    Potassium 4.5 07/04/2017 11:01 AM    Chloride 107 07/04/2017 11:01 AM    CO2 19 (L) 07/04/2017 11:01 AM    Anion gap 11 07/04/2017 11:01 AM    Glucose 132 (H) 07/04/2017 11:01 AM    BUN 25 (H) 07/04/2017 11:01 AM    Creatinine 1.32 07/04/2017 11:01 AM    BUN/Creatinine ratio 28 (H) 05/20/2017 09:06 AM    GFR est AA >60 07/04/2017 11:01 AM    GFR est non-AA 59 (L) 07/04/2017 11:01 AM    Calcium 9.1 07/04/2017 11:01 AM    AST (SGOT) 24 07/04/2017 11:01 AM    Alk. phosphatase 80 07/04/2017 11:01 AM    Protein, total 7.3 07/04/2017 11:01 AM    Albumin 4.1 07/04/2017 11:01 AM    Globulin 3.2 07/04/2017 11:01 AM    A-G Ratio 1.3 07/04/2017 11:01 AM    ALT (SGPT) 38 07/04/2017 11:01 AM       JAK2 testing:        Above results reviewed with patient.    ASSESSMENT:  46 male, non-smoker, commercial sign business owner, h/o obesity, b/l knee arthroscopy (6/16), vit D deficiency, GERD, diverticulitis, allergic rhinitis, lower back pain related to herniated disc 's with plans to undergo laminectomy & fusion. Surgery was deferred related to insurance approval however was also found to have polycythemia, and as such referred to [place]. Review of labs reveals Hgb 18-19 range since 9/13. Hct 55-61 range. Other cell lines are normal. More recently has had some increase in his Cr 1.4-1.6. Recently seen by pulmonology, and diagnosed w moderate sleep apnea, and now on CPAP x 1 month, reports being regular w/ it as makes him  sleep better. Notes chronic LE swelling x 3 yeas. Notes having had a detailed cardiac evaluation and was unremarkable.     Polycythemia is likely secondary related to his OSA and should improve w regular use of CPAP. On once daily baby aspirin (for multiple years). Will r/o primary marrow condition as follows however if secondary polycythemia, then no particular directed therapy will be needed unless Hct over 65%.     09/07/15: Hct stable around 60. Peripheral blood smear w normal morphology. Jak2 mutation analysis -ve. Epo high consistent w secondary polycythemia. Advised to continue CPAP and efforts directed at weight loss: Hct should improve over time. Continue daily aspirin. Hold phlebotomies unless hct increases over 65. Surgery end December. Will get CBC 1 week prior. Otherwise RTC in 3 months.     12/11/14: S/p back surgery w Dr Raul Del approx 5 weeks ago, recovering well w wound completely healed. Reports regular w CPAP. Back on daily baby aspirin. Hct better/improving. Lt leg swelling x 1 week: will get dopplers r/o DVT.     06/17: Hct improved. Regular w CPAP. On testosterone Q7days: likely contributing to polycythemia, advised to discuss w PCP and stop replacement if possible: pt willing.  12/17: Doing well. Remains on testosterone, per pt as felt will help w bone healing per neurosurgery. Will discuss w/ them next visit (in a months time) as surgery almost 1 year out. Will check Jak2 Exon 12,CALR, MPL, and CT CAP (hydration protocol) given hct remains high inspite of CPAP.     01/18: No new complaints. CT CAP without evidence of malignancy except noted liver lesion: will get MRI w/wo contrast to access. Now off testosterone, last injection 2 weeks ago: advised to stay off to see if improvement in polycythemia. Jak2 exon 12-14, CALR, and MPL -ve. Reports h/o below knee DVT 6/16 (was reportedly on testosterone back then also), reports being treated w/ aspirin back then. Planning LT knee procedure in  few day: advise enoxaparin 40 mg daily x 5 days post procedure as prophylaxis.     03/18: Doing well. Has lost weight 15 lbs since last visit by making dietary changes. Has remained off testosterone. Hgb/Hct normal now. S/p RT knee ACL surgery, did well w enoxaparin post-procedure x 5 days. MRI liver with hepatic steatosis otherwise unremarkable.      09/18: Doing well. Enjoyed recent trip to [place], was regular w compression stockings during travelling. Off testosterone, Hgb/hct normal range. Complaint w his CPAP. Continue simple monitoring.     1. Secondary polycythemia (has OSA, improved w using CPAP and coming off testosterone)  2. LLE below knee DVT (post tibial and peroneal vein) 7/16 treated w aspirin (was on testosterone at time of thrombosis). Repeat dopplers 2/17 -ve.     PLAN:  - As above.   - To stay off testosterone replacement. Hct now normal range. Serial monitoring of hct: phlebotomy only if hct over 65%  - OSA: on CPAP, f/u w pulmonology as needed  - Continue daily aspirin  - Increased creatinine: referred to nephro in past  - Pharmacologic and mechanical prophylaxis w any future planned hospitalization/surgical procedure.   - CT CAP 1/18 without malignancy but liver lesion noted: MRI liver 3/18 without liver lesion.     RTC in 8 months w MD w labs, D dimer     I spent over 20 minutes with the patient out of which more than 12 were spent in face to face counseling.    Letha Cape, MD  [facility]  [facility]  [address] [place] [address]  Office : [phone]  Fax : [phone]

## 2017-07-04 NOTE — Patient Instructions (Addendum)
Patient Instructions From Your Nurse    Reason for Visit:  Follow up    Plan:  Your labs look good!     We recommend wearing compression socks especially when travelling or if you're going to be sitting for long periods    Follow Up:  Follow up with Dr. Faith Rogue in 8 months    Recent Lab Results:  Recent Results (from the past 12 hour(s))   CBC WITH AUTOMATED DIFF    Collection Time: 07/04/17 11:01 AM   Result Value Ref Range    WBC 4.8 4.3 - 11.1 K/uL    RBC 4.65 4.23 - 5.67 M/uL    HGB 15.0 13.6 - 17.2 g/dL    HCT 43.2 41.1 - 50.3 %    MCV 92.9 79.6 - 97.8 FL    MCH 32.3 26.1 - 32.9 PG    MCHC 34.7 31.4 - 35.0 g/dL    RDW 12.9 11.9 - 14.6 %    PLATELET 125 (L) 150 - 450 K/uL    MPV 11.2 9.4 - 12.3 FL    ABSOLUTE NRBC 0.01 0.0 - 0.2 K/uL    NEUTROPHILS 61 43 - 78 %    LYMPHOCYTES 28 13 - 44 %    MONOCYTES 8 4.0 - 12.0 %    EOSINOPHILS 2 0.5 - 7.8 %    BASOPHILS 1 0.0 - 2.0 %    IMMATURE GRANULOCYTES 0 0.0 - 5.0 %    ABS. NEUTROPHILS 3.0 1.7 - 8.2 K/UL    ABS. LYMPHOCYTES 1.3 0.5 - 4.6 K/UL    ABS. MONOCYTES 0.4 0.1 - 1.3 K/UL    ABS. EOSINOPHILS 0.1 0.0 - 0.8 K/UL    ABS. BASOPHILS 0.0 0.0 - 0.2 K/UL    ABS. IMM. GRANS. 0.0 0.0 - 0.5 K/UL    RBC COMMENTS NORMOCYTIC/NORMOCHROMIC      WBC COMMENTS Result Confirmed By Smear      PLATELET COMMENTS SLIGHT      DF AUTOMATED     METABOLIC PANEL, COMPREHENSIVE    Collection Time: 07/04/17 11:01 AM   Result Value Ref Range    Sodium 137 136 - 145 mmol/L    Potassium 4.5 3.5 - 5.1 mmol/L    Chloride 107 98 - 107 mmol/L    CO2 19 (L) 21 - 32 mmol/L    Anion gap 11 7 - 16 mmol/L    Glucose 132 (H) 65 - 100 mg/dL    BUN 25 (H) 8 - 23 MG/DL    Creatinine 1.32 0.8 - 1.5 MG/DL    GFR est AA >60 >60 ml/min/1.26m    GFR est non-AA 59 (L) >60 ml/min/1.721m   Calcium 9.1 8.3 - 10.4 MG/DL    Bilirubin, total 0.4 0.2 - 1.1 MG/DL    ALT (SGPT) 38 12 - 65 U/L    AST (SGOT) 24 15 - 37 U/L    Alk. phosphatase 80 50 - 136 U/L    Protein, total 7.3 6.3 - 8.2 g/dL     Albumin 4.1 3.2 - 4.6 g/dL    Globulin 3.2 2.3 - 3.5 g/dL    A-G Ratio 1.3 1.2 - 3.5         Care plan has been discussed and given to patient: n/a        -------------------------------------------------------------------------------------------------------------------  Please call our office at [phone] you have any  of the following symptoms:   ?? Fever of 100.5 or greater  ?? Chills  ??  Shortness of breath  ?? Swelling or pain in one leg    After office hours an answering service is available and will contact a provider for emergencies or if you are experiencing any of the above symptoms.    ? Patient did express an interest in My Chart.  My Chart log in information explained on the after visit summary printout at the [place] desk.    Loel Lofty, RN

## 2017-08-22 ENCOUNTER — Encounter

## 2017-08-22 MED ORDER — OLMESARTAN-HYDROCHLOROTHIAZIDE 40 MG-12.5 MG TAB
ORAL_TABLET | Freq: Every day | ORAL | 0 refills | Status: DC
Start: 2017-08-22 — End: 2017-10-10

## 2017-08-22 NOTE — Telephone Encounter (Signed)
done

## 2017-08-22 NOTE — Telephone Encounter (Signed)
Patient called and said pharmacy had no refills on file for olmesartan. Can you resend?

## 2017-10-10 ENCOUNTER — Ambulatory Visit
Admit: 2017-10-10 | Discharge: 2017-10-10 | Payer: BLUE CROSS/BLUE SHIELD | Attending: Family Medicine | Primary: Family Medicine

## 2017-10-10 DIAGNOSIS — I10 Essential (primary) hypertension: Secondary | ICD-10-CM

## 2017-10-10 MED ORDER — DULOXETINE 30 MG CAP, DELAYED RELEASE
30 mg | ORAL_CAPSULE | Freq: Every day | ORAL | 1 refills | Status: AC
Start: 2017-10-10 — End: ?

## 2017-10-10 MED ORDER — PRAVASTATIN 40 MG TAB
40 mg | ORAL_TABLET | ORAL | 1 refills | Status: DC
Start: 2017-10-10 — End: 2019-09-22

## 2017-10-10 MED ORDER — METFORMIN 1,000 MG TAB
1000 mg | ORAL_TABLET | ORAL | 1 refills | Status: AC
Start: 2017-10-10 — End: ?

## 2017-10-10 MED ORDER — OLMESARTAN-HYDROCHLOROTHIAZIDE 40 MG-12.5 MG TAB
ORAL_TABLET | Freq: Every day | ORAL | 1 refills | Status: AC
Start: 2017-10-10 — End: ?

## 2017-10-10 MED ORDER — METOPROLOL SUCCINATE SR 100 MG 24 HR TAB
100 mg | ORAL_TABLET | Freq: Every evening | ORAL | 1 refills | Status: DC
Start: 2017-10-10 — End: 2019-08-28

## 2017-10-10 MED ORDER — SILDENAFIL 100 MG TAB
100 mg | ORAL_TABLET | ORAL | 5 refills | Status: DC | PRN
Start: 2017-10-10 — End: 2018-03-04

## 2017-10-10 NOTE — Progress Notes (Signed)
HISTORY OF PRESENT ILLNESS  Steven [name] is a 61 y.o. male.  HPI    Medication Evaluation   Pertinent negatives include no chest pain, no abdominal pain, no headaches and no shortness of breath.     Chronic f/u HTN, prediabetes, HLP, anxiety: doing well on current meds.     A little tired maybe lastely.   BP on lower end - wonders if that could be making him tired?  All other meds working well.  Takes cymbalta for chronic pain - might try going off of it b/c not sure if really making a difference. Has neuropathy in toes, still feels it (tingling/numbness).    Review of Systems   Constitutional: Negative for chills, fever and malaise/fatigue.   HENT: Negative for congestion, ear pain and sore throat.    Eyes: Negative for blurred vision, pain and redness.   Respiratory: Negative for cough and shortness of breath.    Cardiovascular: Negative for chest pain and palpitations.   Gastrointestinal: Negative for abdominal pain, nausea and vomiting.   Genitourinary: Negative for dysuria.   Musculoskeletal:. Negative for myalgias.   Neurological: Negative for dizziness and headaches.   Psychiatric/Behavioral: Negative for depression.     Visit Vitals  BP 124/71 (BP 1 Location: Right arm, BP Patient Position: Sitting)   Pulse 71   Ht 5\' 11"  (1.803 m)   Wt 274 lb (124.3 kg)   SpO2 98%   BMI 38.22 kg/m??        Physical Exam   Constitutional: He is oriented to person, place, and time. He appears well-developed and well-nourished. He is cooperative. No distress.   HENT:   Head: Normocephalic and atraumatic.   CV: NSR, no murmurs  Pulmonary/Chest: Effort normal. No respiratory distress. CTAB  Neurological: He is alert and oriented to person, place, and time. Coordination and gait normal.   Skin: No rash noted. He is not diaphoretic. No pallor.    Psychiatric: He has a normal mood and affect. His speech is normal and behavior is normal. Judgment and thought content normal. His mood appears  not anxious. Cognition and memory are normal. He does not exhibit a depressed mood.       ASSESSMENT and PLAN    ICD-10-CM ICD-9-CM    1. Essential hypertension, benign I10 401.1 olmesartan-hydroCHLOROthiazide (BENICAR HCT) 40-12.5 mg per tablet      metoprolol succinate (TOPROL-XL) 100 mg tablet      CBC WITH AUTOMATED DIFF      URINALYSIS W/ RFLX MICROSCOPIC   2. Chronic radicular low back pain M54.16 724.4 DULoxetine (CYMBALTA) 30 mg capsule    G89.29 338.29    3. Pure hypercholesterolemia E78.00 272.0 pravastatin (PRAVACHOL) 40 mg tablet      METABOLIC PANEL, COMPREHENSIVE      LIPID PANEL   4. Erectile dysfunction, unspecified erectile dysfunction type N52.9 607.84 sildenafil citrate (VIAGRA) 100 mg tablet   5. Pre-diabetes R73.03 790.29 metFORMIN (GLUCOPHAGE) 1,000 mg tablet      HEMOGLOBIN A1C WITH EAG   Check labs, adjust meds if needed.    shingrix recommended.  BP been running low - try cutting olmestartan/hctz in 1/2 and monitor BP.    25 minutes spent counseling patient today.     ROS    Physical Exam

## 2017-10-11 LAB — HEMOGLOBIN A1C WITH EAG
Estimated average glucose: 120 mg/dL
Hemoglobin A1c: 5.8 % — ABNORMAL HIGH (ref 4.8–5.6)

## 2017-10-11 LAB — METABOLIC PANEL, COMPREHENSIVE
A-G Ratio: 2 (ref 1.2–2.2)
ALT (SGPT): 34 IU/L (ref 0–44)
AST (SGOT): 25 IU/L (ref 0–40)
Albumin: 4.5 g/dL (ref 3.6–4.8)
Alk. phosphatase: 75 IU/L (ref 39–117)
BUN/Creatinine ratio: 22 (ref 10–24)
BUN: 24 mg/dL (ref 8–27)
Bilirubin, total: 0.2 mg/dL (ref 0.0–1.2)
CO2: 22 mmol/L (ref 20–29)
Calcium: 9.3 mg/dL (ref 8.6–10.2)
Chloride: 102 mmol/L (ref 96–106)
Creatinine: 1.07 mg/dL (ref 0.76–1.27)
GFR est AA: 87 mL/min/{1.73_m2} (ref >59)
GFR est non-AA: 75 mL/min/{1.73_m2} (ref >59)
GLOBULIN, TOTAL: 2.3 g/dL (ref 1.5–4.5)
Glucose: 116 mg/dL — ABNORMAL HIGH (ref 65–99)
Potassium: 4.5 mmol/L (ref 3.5–5.2)
Protein, total: 6.8 g/dL (ref 6.0–8.5)
Sodium: 138 mmol/L (ref 134–144)

## 2017-10-11 LAB — CBC WITH AUTOMATED DIFF
ABS. BASOPHILS: 0 10*3/uL (ref 0.0–0.2)
ABS. EOSINOPHILS: 0 10*3/uL (ref 0.0–0.4)
ABS. IMM. GRANS.: 0 10*3/uL (ref 0.0–0.1)
ABS. MONOCYTES: 0.6 10*3/uL (ref 0.1–0.9)
ABS. NEUTROPHILS: 2 10*3/uL (ref 1.4–7.0)
Abs Lymphocytes: 1.7 10*3/uL (ref 0.7–3.1)
BASOPHILS: 1 %
EOSINOPHILS: 1 %
HCT: 40.5 % (ref 37.5–51.0)
HGB: 12.5 g/dL — ABNORMAL LOW (ref 13.0–17.7)
IMMATURE GRANULOCYTES: 0 %
Lymphocytes: 40 %
MCH: 25.3 pg — ABNORMAL LOW (ref 26.6–33.0)
MCHC: 30.9 g/dL — ABNORMAL LOW (ref 31.5–35.7)
MCV: 82 fL (ref 79–97)
MONOCYTES: 13 %
NEUTROPHILS: 45 %
PLATELET: 191 10*3/uL (ref 150–379)
RBC: 4.95 x10E6/uL (ref 4.14–5.80)
RDW: 15.2 % (ref 12.3–15.4)
WBC: 4.4 10*3/uL (ref 3.4–10.8)

## 2017-10-11 LAB — URINALYSIS W/ RFLX MICROSCOPIC
Bilirubin: NEGATIVE
Blood: NEGATIVE
Glucose: NEGATIVE
Ketone: NEGATIVE
Leukocyte Esterase: NEGATIVE
Nitrites: NEGATIVE
Protein: NEGATIVE
Specific Gravity: 1.02 (ref 1.005–1.030)
Urobilinogen: 0.2 mg/dL (ref 0.2–1.0)
pH (UA): 5.5 (ref 5.0–7.5)

## 2017-10-11 LAB — LIPID PANEL
Cholesterol, total: 168 mg/dL (ref 100–199)
HDL Cholesterol: 34 mg/dL — ABNORMAL LOW (ref >39)
LDL, calculated: 100 mg/dL — ABNORMAL HIGH (ref 0–99)
Triglyceride: 172 mg/dL — ABNORMAL HIGH (ref 0–149)
VLDL, calculated: 34 mg/dL (ref 5–40)

## 2017-10-14 ENCOUNTER — Encounter

## 2017-10-14 NOTE — Telephone Encounter (Signed)
-----   Message from Allene DillonLindsey Caston Cecil, MD sent at 10/14/2017  6:47 AM EST -----  A1C stable 5.8, cholesterol up a little but still in range. One new thing is his Hgb is a little low at 12.5 (which is the opposite of what he usually is) - this could be making him a little tired. Any blood in stool or dark stools? The most common cause is stomach ulcer or colon issue, but rec we repeat this in 1-2 wks (lab only for CBC) and if still low I will want to refer him back to GI for a recheck.

## 2017-10-14 NOTE — Telephone Encounter (Signed)
LVM For patient with lab results- is to call if having any bloody stool symptoms and is to return in 1-2 weeks for CBC recheck

## 2017-10-21 ENCOUNTER — Encounter: Admit: 2017-10-21 | Discharge: 2017-10-21 | Payer: BLUE CROSS/BLUE SHIELD | Primary: Family Medicine

## 2017-10-21 DIAGNOSIS — R7989 Other specified abnormal findings of blood chemistry: Secondary | ICD-10-CM

## 2017-10-22 LAB — CBC WITH AUTOMATED DIFF
ABS. BASOPHILS: 0 10*3/uL (ref 0.0–0.2)
ABS. EOSINOPHILS: 0.1 10*3/uL (ref 0.0–0.4)
ABS. IMM. GRANS.: 0 10*3/uL (ref 0.0–0.1)
ABS. MONOCYTES: 0.8 10*3/uL (ref 0.1–0.9)
ABS. NEUTROPHILS: 2.8 10*3/uL (ref 1.4–7.0)
Abs Lymphocytes: 1.9 10*3/uL (ref 0.7–3.1)
BASOPHILS: 1 %
EOSINOPHILS: 2 %
HCT: 42.1 % (ref 37.5–51.0)
HGB: 14.2 g/dL (ref 13.0–17.7)
IMMATURE GRANULOCYTES: 0 %
Lymphocytes: 33 %
MCH: 31.2 pg (ref 26.6–33.0)
MCHC: 33.7 g/dL (ref 31.5–35.7)
MCV: 93 fL (ref 79–97)
MONOCYTES: 14 %
NEUTROPHILS: 50 %
PLATELET: 170 10*3/uL (ref 150–379)
RBC: 4.55 x10E6/uL (ref 4.14–5.80)
RDW: 14.3 % (ref 12.3–15.4)
WBC: 5.7 10*3/uL (ref 3.4–10.8)

## 2017-10-24 NOTE — Telephone Encounter (Signed)
-----   Message from Allene DillonLindsey Caston Cecil, MD sent at 10/23/2017  3:03 PM EST -----  Repeat blood work was normal, so no further testing needed at this time. We will cont to monitor

## 2017-10-24 NOTE — Telephone Encounter (Signed)
LVM for patient about normal lab results

## 2017-11-12 ENCOUNTER — Encounter: Attending: Family Medicine | Primary: Family Medicine

## 2018-02-27 ENCOUNTER — Encounter: Primary: Family Medicine

## 2018-02-27 ENCOUNTER — Encounter: Attending: Hematology & Oncology | Primary: Family Medicine

## 2018-03-04 ENCOUNTER — Ambulatory Visit: Admit: 2018-03-04 | Discharge: 2018-03-04 | Payer: BLUE CROSS/BLUE SHIELD | Primary: Family Medicine

## 2018-03-04 DIAGNOSIS — G4733 Obstructive sleep apnea (adult) (pediatric): Secondary | ICD-10-CM

## 2018-03-04 NOTE — Progress Notes (Signed)
[facility]  [address]  [place], [place] [address]  [phone]    Patient Name:  Steven [name]  Date of Birth:  Nov 09, 1955      Office Visit 2018-03-07    CHIEF COMPLAINT:    Chief Complaint   Patient presents with   ??? Sleep Apnea         HISTORY OF PRESENT ILLNESS:    The patient presents today in routine sleep follow-up.  Since his last visit about a year ago he is gained 23 pounds.  He states that he is actually down about 5 pounds from his peak weight after trying to increase his physical activity.  He really is not doing anything in terms of his diet and we discussed the importance of weight loss through caloric restriction.  The importance of limiting simple carbohydrates was reviewed and he is provided handouts regarding the [place] and [employer].    He remains very compliant with CPAP.  He is on CPAP at 10 with an EPR of 1 via nasal pillow mask.  Compliance is 98% over the past year.  His AHI is running 0.2 with a moderate mask leak peaking at 39.2 L.  He is having some issues with the mask and I will have him looked at in the CPAP clinic today to optimize placement of the mask and answer his questions.    The patient does have significant orthopedic issues of his lower extremities.  We discussed that his weight gain could further promote degenerative changes of his lower extremities exacerbating his inability to exercise.    Past Medical History:   Diagnosis Date   ??? Allergic rhinitis, cause unspecified    ??? BMI 40.0-44.9, adult (HCC)    ??? Chronic left sacroiliac joint pain    ??? Chronic radicular low back pain         ??? Diverticulitis    ??? Essential hypertension, benign    ??? Generalized anxiety disorder    ??? GERD (gastroesophageal reflux disease)     occ no meds   ??? Hypercholesterolemia    ??? Left lumbar radiculitis     He has had low back pain that radiates down his LLE for years.   ??? Lumbar stenosis with neurogenic claudication 11/06/2015    ??? Mood disorder (HCC) 07/14/2014   ??? Morbid obesity (HCC)     bmi = 40   ??? OSA on CPAP     wears cpap at hs   ??? Polycythemia dx--06/2015    followed by Dr Rose Fillers (heme-onc); "Advised to consider stopping testosterone replacement. Serial monitoring of hct: phlebotomy only if hct over 65%" per office note 10/10/16. Hgb 18-19 since 06/2012.   ??? Prediabetes     does not check BG at home, metformin daily, last hgba1c- 6.5 (05/2016)   ??? Spondylolisthesis at L4-L5 level 10/19/2015   ??? Testicular hypofunction    ??? Thrombophlebitis of left leg (HCC) 04/2015    posterior tibial vein and peroneal vein   ??? Venous insufficiency of both lower extremities 07/14/2014   ??? Vitamin D deficiency               Problem List  Date Reviewed: 03-07-2018          Codes Class Noted    Complete tear of right ACL ICD-10-CM: B14.782N  ICD-9-CM: 844.2  10/14/2016        Tear of lateral meniscus of right knee ICD-10-CM: F62.130Q  ICD-9-CM: 836.1  10/14/2016        Osteoarthritis of right knee ICD-10-CM: M17.11  ICD-9-CM: 715.96  10/14/2016        GERD (gastroesophageal reflux disease) ICD-10-CM: K21.9  ICD-9-CM: 530.81  Unknown    Overview Signed 12/22/2015  3:24 PM by Salley Scarlet     occ no meds             Hypercholesterolemia ICD-10-CM: E78.00  ICD-9-CM: 272.0  Unknown        Morbid obesity (HCC) ICD-10-CM: E66.01  ICD-9-CM: 278.01  Unknown    Overview Signed 12/22/2015  3:24 PM by Salley Scarlet     bmi = 40             Lumbar stenosis with neurogenic claudication ICD-10-CM: M48.062  ICD-9-CM: 724.03 Chronic 11/06/2015        Spondylolisthesis at L4-L5 level ICD-10-CM: M43.16  ICD-9-CM: 756.12  10/19/2015        OSA (obstructive sleep apnea) ICD-10-CM: G47.33  ICD-9-CM: 327.23  06/30/2015        Chronic radicular low back pain ICD-10-CM: M54.16, G89.29  ICD-9-CM: 724.4, 338.29  09/11/2014        Chronic left sacroiliac joint pain ICD-10-CM: M53.3, G89.29  ICD-9-CM: 724.6, 338.29  08/23/2014        Mood disorder (HCC) ICD-10-CM: R60   ICD-9-CM: 296.90  07/14/2014        Venous insufficiency of both lower extremities ICD-10-CM: I87.2  ICD-9-CM: 459.81  07/14/2014        Generalized anxiety disorder ICD-10-CM: F41.1  ICD-9-CM: 300.02  Unknown        Testicular hypofunction ICD-10-CM: E29.1  ICD-9-CM: 257.2  Unknown        Allergic rhinitis, cause unspecified ICD-10-CM: J30.9  ICD-9-CM: 477.9  Unknown        Pre-diabetes ICD-10-CM: R73.03  ICD-9-CM: 790.29  Unknown    Overview Addendum 01/15/2017 10:31 AM by Fenton Malling     11/2016, 06/16/15 Annual Dilated Eye Exam shows no diabetic retinopathy             Essential hypertension, benign ICD-10-CM: I10  ICD-9-CM: 401.1  Unknown                Past Surgical History:   Procedure Laterality Date   ??? ENDOSCOPY, COLON, DIAGNOSTIC  2011    Diverticulosis   ??? HX COLONOSCOPY  05/2015    Diverticulosis   ??? HX KNEE ARTHROSCOPY Bilateral 04/2015    Partial medial and lateral meniscectomies by Dr. Benjamine Mola   ??? HX ORTHOPAEDIC Left 10/19/2015    L4-5 TLIF, decompression. Dr. Dorene Grebe       No flowsheet data found.          Social History     Socioeconomic History   ??? Marital status: MARRIED     Spouse name: Not on file   ??? Number of children: Not on file   ??? Years of education: Not on file   ??? Highest education level: Not on file   Occupational History   ??? Not on file   Social Needs   ??? Financial resource strain: Not on file   ??? Food insecurity:     Worry: Not on file     Inability: Not on file   ??? Transportation needs:     Medical: Not on file     Non-medical: Not on file   Tobacco Use   ??? Smoking status: Never Smoker   ??? Smokeless tobacco: Never  Used   Substance and Sexual Activity   ??? Alcohol use: Yes     Alcohol/week: 0.6 oz     Types: 1 Cans of beer per week     Comment: social   ??? Drug use: No   ??? Sexual activity: Not on file   Lifestyle   ??? Physical activity:     Days per week: Not on file     Minutes per session: Not on file   ??? Stress: Not on file   Relationships   ??? Social connections:      Talks on phone: Not on file     Gets together: Not on file     Attends religious service: Not on file     Active member of club or organization: Not on file     Attends meetings of clubs or organizations: Not on file     Relationship status: Not on file   ??? Intimate partner violence:     Fear of current or ex partner: Not on file     Emotionally abused: Not on file     Physically abused: Not on file     Forced sexual activity: Not on file   Other Topics Concern   ??? Not on file   Social History Narrative   ??? Not on file         Family History   Problem Relation Age of Onset   ??? Diabetes Mother    ??? Alzheimer Mother    ??? Heart Disease Father         atherosclerosis (heavy smoker)   ??? Diabetes Sister          Allergies   Allergen Reactions   ??? Nsaids (Non-Steroidal Anti-Inflammatory Drug) Other (comments)     Cause pedal edema if taken for a number of days in a row         Current Outpatient Medications   Medication Sig   ??? olmesartan-hydroCHLOROthiazide (BENICAR HCT) 40-12.5 mg per tablet Take 1 Tab by mouth daily.   ??? DULoxetine (CYMBALTA) 30 mg capsule Take 1 Cap by mouth daily.   ??? pravastatin (PRAVACHOL) 40 mg tablet TAKE 1 TABLET BY MOUTH AT BEDTIME   ??? metoprolol succinate (TOPROL-XL) 100 mg tablet Take 1 Tab by mouth nightly.   ??? metFORMIN (GLUCOPHAGE) 1,000 mg tablet TAKE ONE TABLET BY MOUTH TWICE A DAY   ??? OMEPRAZOLE PO Take  by mouth as needed.   ??? diclofenac EC (VOLTAREN) 50 mg EC tablet Take 1 Tab by mouth two (2) times a day. (Patient taking differently: Take 50 mg by mouth as needed.)   ??? aspirin (ASPIRIN LOW-STRENGTH) 81 mg chewable tablet Take 81 mg by mouth every morning. Indications: continue   ??? MULTI-VITAMIN HI-PO PO Take 2 Tabs by mouth every morning. Indications: hold until after surgery     No current facility-administered medications for this visit.          REVIEW OF SYSTEMS:   CONSTITUTIONAL:   There is no history of fever, chills, night sweats, weight loss,  persistent fatigue, or lethargy/hypersomnolence.  He has had significant weight gain as above.   CARDIAC:   No chest pain, pressure, discomfort, palpitations, orthopnea, murmurs, or edema.   GI:   No dysphagia, heartburn reflux, nausea/vomiting, diarrhea, abdominal pain, or bleeding.   NEURO:   There is no history of AMS, persistent headache, decreased level of consciousness, seizures, or motor or sensory deficits.      PHYSICAL  EXAM:    Visit Vitals  BP 132/76   Pulse 67   Ht  (1.803 m)   Wt 289 lb (131.1 kg)   SpO2 99%   BMI 40.31 kg/m??      GENERAL APPEARANCE:   The patient is obese and in no respiratory distress.   HEENT:   PERRL.  Conjunctivae unremarkable.   Nasal mucosa is without epistaxis, exudate, or polyps.  Gums and dentition are unremarkable.  There is moderate oropharyngeal narrowing.   NECK/LYMPHATIC:   Symmetrical with no elevation of jugular venous pulsation.  Trachea midline. No thyroid enlargement.  No cervical adenopathy.   LUNGS:   Normal respiratory effort with symmetrical lung expansion.   Breath sounds are mildly decreased but clear.   HEART:   There is a regular rate and rhythm.  No murmur, rub, or gallop.  There is no edema in the lower extremities.   ABDOMEN:   Soft and non-tender.  Bowel sounds are normal.     NEURO:   The patient is alert and oriented to person, place, and time.  Memory appears intact and mood is normal.  No gross sensorimotor deficits are present.        ASSESSMENT:  (Medical Decision Making)       ICD-10-CM ICD-9-CM    1. OSA (obstructive sleep apnea) G47.33 327.23 This remains very well treated with current therapy.  No change in his CPAP settings as needed.  Supplies are renewed.     2. Morbid obesity (HCC) E66.01 278.01 This is significantly worse.  He describes the weight gain to inability to exercise due to orthopedic issues.  This is better but he clearly needs to incorporate a diet into  his therapy.  He is provided handouts regarding the [place] and [employer] which are both low glycemic and very effective when followed.          PLAN:    Continue current CPAP as prescribed.  Supplies are renewed.  Weight loss through caloric restriction is essential.  Limiting simple carbohydrates is important and he is provided handouts regarding the diet as above which are both low glycemic.  Follow-up will be in about 4 months mainly to see if he is able to lose weight.  If not we may need to check an overnight oximetry to assure that he is not desaturating during REM sleep which can occur in the absence of an increase in the AHI.      No orders of the defined types were placed in this encounter.      Malissa Hippo, MD  Electronically signed    Over 50% of today's office visit was spent in face to face time reviewing test results, prognosis, importance of compliance, education about disease process, benefits of medications, instructions for management of acute flare-ups, and follow up plans.  Total face to face time spent with the patient and charting was 19 minutes.      Dictated using voice recognition software.  Proof read but unrecognized errors may exist.

## 2018-03-04 NOTE — Patient Instructions (Signed)
[place] Diet for Beginners:   Most people are sick of trying new diets for one reason ??? they do not work! What makes the [place] Diet different?    What is the [place] Diet?  [place] Diet teaches a way of life where you rely on the right carbohydrates and fats. This new way of eating allows you to live contently without eating the bad carbohydrates and fats. In contrast, when a person eats bad carbohydrates and fats they feel hungrier, causing them to eat more, which causes weight gain. In exchange for eating right, you become healthier and can enjoy an 8 to 13 pound weight loss in just two weeks!    The Diet was created by Dr. Arthur Agatston, a highly respected cardiologist, to work with your body safely and effectively. This diet works in phases, the first two for a specific timeframe and the third phase for life. With this new approach, you can stop counting calories, stop weighing food portions, and stop feeling as though you are deprived from eating good-tasting and satisfying food! Actually, you will be eating three, normal-size meals but wait, that not all! You will also get two snacks each day and with meal plans that are designed to be flexible, you can enjoy a variety, based on what sounds good to you on any particular day.    Best of all, you will see amazing results in a short amount of time. Your hips, thighs, and stomach will be thinner, the number on the scales will go down, and all those overwhelming food cravings will be gone! Just imagine losing weight while still enjoying many of your favorite foods. With the diet, you can dine on mouth-watering foods like Chicken en Papillote, Shrimp Louis, and even Chocolate Sponge Cake and still lose the weight!         [place] Diet Phase     There are basically three phases in [place] Diet. You eat normal portion sizes In Phase 1, but all carbohydrate are restricted. This is the  strictest phase in the diet and will last for two weeks. It emphasizes lean meats, such as chicken, turkey, fish, and shellfish. Low-Glycemic-index vegetables are allowed as well as low-fat cheese, nuts, eggs. Dieters should expect to lose somewhere between 8 to 12 pounds. In Phase 2, some of the banned food are slowly introduced while weight loss continue to around 1-2 pounds per week. You should remain on it until you lost your desired amount of weight. Phase 3 is for maintenance and should be followed for life. Is all about maintaining your desired weight with a healthy balanced diet. Should your weight begin to climb, simply return to Phase 1.    To learn what the phases consist of, how [place] Diet plan works, and what you can eat, keep reading!    Caution:    As with any new diet, if you have any illness, are pregnant, or have questions or concerns, consult your physician prior to starting. Specific to the diet, if you have kidney problems, or have diabetes that might have impaired your kidney function, talk to your physician before starting this diet.  How does the [place] Diet Work?   As mentioned, the [place] Diet is unique, successful, easy, and works in a three-phase process. Unlike many other so-called diets, with the [place] Diet, simply substitutes your bad carbohydrates and fats for good ones. After trying this, you will be amazed by   how well and quickly it works.         [place] Diet Phase 1    [place] Diet Phase 1 lasts for two weeks. During this first phase, you will eat normal meals of chicken, beef, turkey, fish, and shellfish, lots of vegetables, eggs, cheese, nuts, and garden salads using 100% olive oil for your salad dressing. Each day for 14 days, you will eat three, well-balanced meals. While eating until your hunger is satisfied may go against most diets, with the [place] Diet, it is part of the plan.     Trying to lose weight and become healthy by depriving the body of food makes no sense. In addition to the three meals each day, you will also eat a snack between breakfast and lunch, and then again between lunch and dinner. Even if you do not feel like eating these snacks, for the [place] Diet to work, you need to, and after dinner, you will even have dessert. Additionally, during this phase, you can drink all the coffee and tea you want and be sure to drink lots of water.     You may be thinking that this is a lot of food - it is! With most diets, you deprive your body, eating only small portions of foods that are unappealing. The change you will make during this phase is that you will cut out all bread, rice, potatoes, pasta, baked goods, fruit, candy, cake, cookies, ice cream, or sugar. Keep in mind that these eliminated foods will be added back into your diet, starting in Phase 2. In addition to taking these foods out of your diet temporarily, you will also need to avoid beer, or any kind of alcohol. Once you start Phase 2, reasonable amounts of wine can be added back in.    Instead of feeling overwhelmed about the foods that will be taken out of your diet during the first two weeks, stop and think about this for a minute. To achieve a life of health and lose unwanted weight, two weeks is a small investment to make. After all, you are worth it! The first two or three days will be somewhat challenging, but breaking any bad habit starts out a little bumpy. Once you pass this small hurdle, the rest of the time will go by quicker than you think. When you see the results that these changes bring, you will be glad you did not give up!     [place] Diet Phase 2     [place] Diet Phase 2 is different from the first in that it will last as long as it takes you to lose your desired weight. This phase will last different lengths of time depending on each individual person, how well  they follow the diet, and how their body reacts.    Do not forget that by the time you start this phase, you will already be 8 to 13 pounds lighter! Other changes you will notice when starting Phase 2 is that your outlook on eating will be changed. During the past two weeks, the way in which your body was reacting to foods making you overweight was corrected. Those old, nagging cravings have been squelched and the bad eating habits of the past are gone.    The secret is not that you are eating less food, but eating fewer foods that start cravings and store excessive fat. Once the first phase ends, the weight will continue to come off   by staying on the plan. Some of the indulgences you took out of your diet during Phase 1 will be added back in, but less often. When you want a piece of chocolate cake, you can have one, just not every night. If hot garlic bread sounds good, you can have this too, but not as often and with less enthusiasm.    As you go through phase 2, your weight loss will not be as dramatic, but it will be consistent. On average, you can expect to lose from one to two pounds. While these numbers are lower than what you achieved in the first phase, losing slowly is healthier and you will keep the weight off.         [place] Diet Phase 3    [place] Diet Phase 3 begins when you have hit your target weight. To help you maintain your new weight, you will be able to enjoy options that are more liberal. Once you get to this phase, you will stay there for the rest of your life. After completing Phase 1 and 2, this phase will feel like normal eating to you. Now you are eating differently ??? for life! If your weight starts creeping back up, modify the foods you are eating and the amounts.    Along with losing extra weight and changing the way your body responds to food, your blood chemistry is also changing, improving your cardiovascular  system. With heart disease being a leading cause of death where the only symptom of heart attack is often death, improving your cardiovascular system will add quality years to your life. Although you may have started the [place] Diet as a way to lose weight, when finished, you will see that you achieved so much more!    [place] Diet Food List    During Phase 1, the foods eaten will be in controlled portions to include chicken, beef, turkey, fish, and shellfish. These type of food can also be eaten along with vegetables, eggs, cheese, nuts, and salads, but all will be in controlled portions too. Refer to the following list to see the type of food that are acceptable in phase 1, 2, and 3. Remember that bread, rice, potatoes, pasta, baked goods, fruit, candy, cake, cookies, ice cream, or sugar will be added back slowly into your diet in Phase 2.    Foods Allowed in Phase 1    BEEF Lean cuts, such as:  Eye of Round  Ground beef:  Extra Lean (96/4)  Lean (92/8)  Sirloin (90/10)  Tenderloin  Top Loin  Top Round  LAMB (Remove all visible fat)    Center Cut  Chop  Loin  PORK    Boiled ham  Canadian bacon  Loin  Tenderloin       POULTRY (SKINLESS)    Cornish hen  Turkey bacon (2 slices per day)  Turkey and chicken breast  SEAFOOD    All types of fish and shellfish  TOFU    Use soft, low-fat or lite varieties  VEAL    Chop  Cutlet, leg  Top round  EGGS    The use of whole eggs is not limited unless otherwise directed by your doctor. Use egg whites and egg substitute if desired.  LUNCHMEAT    Fat-free or low-fat only  MEAT SUBSTITUTES (SOY BASED)    Bacon - Limit to 2 slices per day  Burger - < 3 gms fat per 2-3 oz portion  Chicken   Patties & Nuggets - < 3 gms fat per 2-3 oz portion  Hot Dogs - < 3 gms fat per 2-3 oz portion  Natural Peanut Butter - 2 Tbsp (may use as protein choice or limited nut choice)  Sausage Pattie - Limit 1 patty per day  Seiten  Soy Crumbles   Soy Nuts - 1/4 cup for a protein snack is suggested serving  Tempeh  Yuba  DAIRY    Low-fat (1 percent) or fat-free milk or soy milk  Plain or sugar-free low-fat or fat-free yogurt  Fat-free half & half  CHEESE (FAT-FREE OR LOW-FAT)  American  Cheddar  Cottage cheese, 1-2% or fat-free  Cream cheese substitute, dairy-free  Feta  Mozzarella  Parmesan  Provolone  Ricotta  String      NUTS (Limit to one serving per day as specified)    Almonds - 15 (Dry roasted recommended)  Brazil Nuts - 4  Cashews - 15 (Dry roasted recommended)  Pecans - 15 (Dry roasted recommended)  Macadamia - 8 (Dry roasted recommended)  Peanut Butter - 1 tsp  Peanut Butter, Natural = 2 TBS  Peanuts, 20 small (May use dry roasted or boiled)  Pine Nuts ([place]) - 1 ounce  Pistachios - 30 (Dry roasted recommended)  Walnuts - 15 (Dry roasted recommended)  In place of nuts, may use: Flax Seed - 3 TBS    VEGETABLE CHOICES (includes legumes) (May use fresh, frozen or canned without added sugar)  Artichokes  Asparagus  Beans, Green  Beans, Italian  Beans, Wax  Beans or Legumes:  Black Beans  Butter Beans  Chickpeas or Garbanzo  Pigeon Peas  Soy Beans  Split Peas  Broccoli  Bok Choy  Cabbage  Cauliflower  Celery  Collard Greens  Cucumbers  Eggplant  Lettuce (All varieties)  Juice (Limit to 6 ounces per day)  Tomato  V-8  Mushrooms  Mustard Greens  Okra  Onion - Limit to 1/2 per day  Peppers (All varieties)  Pickles - Dill or those sweetened with Splenda??  Radishes (All varieties)  Rhubarb  Sauerkraut  Snow peas  Spinach  Sprouts, Alfalfa  Squash, Spaghetti  Squash, Summer  Yellow  Zucchini  Tomato - Limit to 1 whole or 10 cherry per serving  FAT CHOICES (with some suggested serving sizes) The following monounsaturated oils are recommended to be consumed daily:    Olive Oil  Canola Oil  Other Oil Choices that may be chosen (Polyunsaturated or a blend of Monounsaturated):    Corn  Enova  Grape seed  Safflower  Soybean  OTHER FAT CHOICES:     Avocado - 1/3 whole = 1 TBS oil  Guacamole - ?? cup = 1 TBS oil  Margarine - Chose those that do not contain Trans Fatty Acids such as Fleishmann's Premium Olive Oil or Smart Balance  Mayonnaise - Regular or Low Fat  Olives (Green or Ripe) 15 = 1/2 TBS  Salad Dressing - Use those < 3 gms sugar per serving  TOPPINGS & SAUCES use sparingly (check labels for added sugar)    Hot Sauce  Salsa - Limit to 2 TBS during phase 1  Soy Sauce - 1/2 TBS  Steak Sauce - 1/2 TBS  Worcestershire Sauce - 1 TBS  Whipped Topping (Light) - 2 TBS  SPICES AND SEASONINGS    All spices that contain no added sugar  Broth  Extracts (almond, vanilla, or others)  Horseradish sauce  I Can't Believe It's Not Butter! Spray    Lemon Juice  Lime Juice Pepper (black, cayenne, red, white)  SWEET TREATS (Limit to 75 calories per day)    Candies, hard, sugar-free  Chocolate powder, no-added-sugar  Cocoa powder, baking type  Fudgsicles, sugar-free  Gelatin, sugar-free  Gum, sugar-free  Popsicles, sugar-free  Sugar substitute Some Sugar Free  Products may be made with sugar alcohols (isomalt, lactitol, mannitol, sorbitol or xylitol) and are permitted on the SBD. They may have associated side effects of GI distress (abdominal pain, diarrhea & gas) if consumed in excessive amounts.    SUGAR SUBSTITUTES    Acesulfame K  Fructose (needs to be counted as Sweet Treats, Caloric Limit)  Nutrasweet (Equal)  Saccharin (Sweet & Low)  Sucralose (Splenda)  Stevia (Not approved by FDA)     Foods NOT Allowed and to be Avoided in Phase 1    VEGETABLES  Beets  Carrots  Corn  Potatoes, white  Potatoes, sweet  Yams  BEEF  Brisket  Liver  Other fatty cuts  Rib steaks  POULTRY    Chicken, wings and legs  Duck  Goose  Poultry products, processed  PORK    Honey-baked ham  VEAL    Breast  FRUIT   Avoid ALL fruits and fruit juices in Phase 1, including:    Apples  Apricots  Berries  Cantaloupe  Grapefruit  Peaches  Pears  DAIRY  1/2 cup of plain fat-free yogurt (once per day max.)   Fat Free 1/2 & 1/2, Nonfat milk, 1% milk,  Soy milk allowed with coffee. Otherwise avoid all other dairy products (unless listed under protein choices or sweet treats).  Limit to < 2 TBS per cup of coffee. Otherwise, avoid all milk products in Phase 1, including:  Yogurt, cup-style and frozen  Ice cream  Milk, low-fat, fat-free, whole  Milk, soy  STARCHES AND CARBS    Avoid ALL starchy food in Phase 1, including:    Bread, all types  Cereal  Croutons, all types  Matzo  Oatmeal  Rice, all types  Pasta, all types  Pastry and baked goods, all types  CHEESE    Brie  [place]am  Non-reduced fat  MISCELLANEOUS    Alcohol of any kind, including beer and wine  No regular ketchup or cocktail sauce  No pork rinds - too high in saturated fat  No jerky - too high in sugar content  Limit Caffeine-Containing Beverages to 1-2 servings per day    Foods Allowed in Phase 2 (Foods you can reintroduce to your diet)    FRUITS  Apples   Apricots-dried fresh   Blueberries   Cantaloupe   Cherries   Grapefruit   Grapes   Kiwi   Mangoes   Oranges   Peaches   Pears   Plums   Strawberries   DAIRY     Milk-light soy, fat-free or 1%   Yogurt-light, fruit-flavored, plain, low-fat or fat-free        STARCHES (use sparingly)   Bagels, small, whole grain   Bread-multigrain, oat and bran, rye, whole wheat   Cereal-Fiber One, Kellogg's Extra-Fiber All Bran, oatmeal (not instant), other high-fiber, Uncle Sam   Muffins, bran-sugar-free (no raisins)   Pasta, whole wheat   Peas, green   Pita-stone-ground, whole wheat   Popcorn   Potato, small, sweet   Rice-brown, wild   VEGETABLES AND LEGUMES    Barley   Beans, pinto   Black-eyed peas  MISCELLANEOUS    Chocolate (sparingly)-bittersweet, semisweet   Pudding, fat-free/sugar-free     Wine, red     Foods NOT Allowed and to be Avoided in Phase 2    STARCHES AND BREADS   Bagel, refined wheat   Bread-refined wheat white   Cookies   Cornflakes   Matzo   Pasta, white flour   Potatoes-baked, white instant   Rice cakes    Rice, white   Rolls, dinner   VEGETABLES     Beets   Carrots   Corn   Potatoes   FRUIT   Bananas   Canned fruit, juice packed   Fruit juice   Pineapple   Raisins   Watermelon    MISCELLANEOUS     Honey   Ice cream   Jam      WHAT IS THE WHEAT BELLY DIET?    Wheat Belly: Lose the Wheat, Lose the Weight, and Find Your Path Back to Health is the book by the renowned cardiologist, Dr. William Davis, which explains how eliminating wheat from our diets can result in numerous health benefits, including weight loss.    Wheat Belly is really about two things:     1) eliminating wheat (and other gluten-containing grains such as barley and rye), and   2) managing carbohydrates/sugars to individual tolerance levels to manage blood sugar and promote weight-loss       The theory is that wheat promotes high blood sugar which though a series of reactions, causes the body to accumulate more visceral fat.        The Plan   Wheat isn't the only bad boy in this diet. Many other foods are either eliminated or eaten in limited quantities such as fruit, starchy veggies, whole grains, legumes, dried fruit, corn starch and corn meal. Three meals a day are encouraged without any snacks.  So what can you eat? Here???s the basic list:     In unlimited quantities:   Veggies (except potatoes and corn)   Raw nuts and seeds   Oils   Meats and eggs   Cheese   Non-sugary condiments (like mustard and salsa)   Ground flaxseed   Avocados, olives, coconut   Spices   Unsweetened cocoa        In limited quantities:   Dairy (except cheese)   Fruit   Whole corn (like on the cob)   Fruit juice   Non-wheat, non-gluten grains (like quinoa, amaranth, and rice)   Beans, peas, and lentils   Soy products (fermented soy products preferred)  Unlike other diet plans, there is basically one phase: toss out all your wheat foods and go cold turkey. Once you go wheat free, the author claims that the pounds will come right off and you???ll immediately feel better.  Fasting, defined as drinking water only, is highly recommended for several days or even weeks at a time.        How to Eat a Wheat Belly Diet in a Healthy Way  Best Wheat Belly Foods to Eat:  All varieties of fresh vegetables, especially those that are non-starchy and low in calories. These include things like cruciferous veggies (broccoli or Brussels sprouts, for example), leafy greens, peppers, mushrooms, asparagus, artichoke, etc.   Fresh fruit (but not processed juices), including berries, apples, melon, and citrus fruits like grapefruit or oranges. Some people prefer to eat mostly low-sugar fruits but avoid those higher in sugar like pineapple, papaya, mango or banana.   Healthy fats like coconut oil or olive oil, raw nuts and seeds, avocado, coconut milk, olives, cocoa   butter, and grass-fed butter or ghee.   Grass-fed, humanely raised meat and eggs, plus wild-caught fish.   Full-fat cheeses (ideally made from raw, organic milk).   Fermented foods like unsweetened kefir or yogurt, pickled or cultured vegetables, and in moderation tofu, tempeh, miso and natto.   If they???re well-tolerated, unprocessed grains in moderation, including quinoa, millet, buckwheat (not actually a type of wheat), brown rice and amaranth.               Worst Wheat Belly Foods to Avoid:    Eating a wheat belly diet means avoiding anything made with the grains wheat, barley, rye, spelt or certain oats. Additionally, Davis recommends avoiding added sugar, condiments that include synthetic or chemically altered ingredients, sugary drinks and other processed foods as much as possible. Below are the main foods to exclude from your diet if you choose to try following this dietary plan:  Grain-based desserts, including both packaged or homemade cakes, cookies, donuts, pies, crisps, cobblers, and granola bars   Breads, especially those made with refined wheat flour. Even many ???gluten-free breads??? or packaged products should not contribute many  calories to your diet. While products made from grains other than wheat (like corn or rice) might be free of gluten, they???re still usually not very nutrient-dense and are inferior to eating whole, sprouted ancient grains like oats, quinoa, wild rice or teff, for example. Plus, modern food-processing techniques usually contaminate these foods with gluten since they???re processed using the same equipment that wheat is.   Most cereals   Pizza   Pasta and noodles   Chips and crackers   Wheat tortillas, wraps, burritos and tacos   Fast food   Take-out, including most Mexican or Italian dishes, burgers and deli sandwiches   Breaded proteins like chicken cutlets, processed meats, hot dogs and frozen veggie burgers   Added sugar, including high fructose corn syrup, sucrose, dried fruit, juices and sugary beverage   Processed rice and potato products   Trans fats, fried foods and cured meats  Tips for Giving Up Wheat and Processed Grains:  When grocery shopping, check ingredients carefully and look for products made without wheat, rye and barley. This might mean choosing certified ???gluten-free??? items in some cases, although even these can be highly processed. The most substantial sources of wheat in your diet are likely bread or baked products made with wheat flour (like pizza, pasta at restaurants, bread, etc.), so unless specifically noted that these are grain- or gluten-free, assume they contain wheat.   If you are going to buy bread, look for sourdough or sprouted grain breads (like Ezekiel bread), which are usually better tolerated than ordinary wheat-flour breads.   When it comes to baking or using flour in recipes, try some of these naturally gluten-free flour alternatives over wheat flour: brown rice, quinoa, chickpea, almond and coconut flour.   Remember that wheat is hiding in many condiments, sauces, dressings, etc. Avoid any that contain flour or added sugar, sticking with basic  condiments or flavor enhancers like vinegar, herbs, spices and real bone broth.   Many types of alcohol, including beer, also contain wheat. Hard liquor and wine are better options, however watch the amount you consume and what you mix these with.

## 2018-04-16 ENCOUNTER — Encounter

## 2018-04-17 ENCOUNTER — Inpatient Hospital Stay: Admit: 2018-04-17 | Payer: BLUE CROSS/BLUE SHIELD | Primary: Family Medicine

## 2018-04-17 ENCOUNTER — Encounter: Payer: BLUE CROSS/BLUE SHIELD | Attending: Hematology & Oncology | Primary: Family Medicine

## 2018-04-17 DIAGNOSIS — D751 Secondary polycythemia: Secondary | ICD-10-CM

## 2018-04-17 LAB — COMPREHENSIVE METABOLIC PANEL
ALT: 39 U/L (ref 12–65)
AST: 21 U/L (ref 15–37)
Albumin/Globulin Ratio: 1.2 (ref 1.2–3.5)
Albumin: 4.2 g/dL (ref 3.2–4.6)
Alkaline Phosphatase: 89 U/L (ref 50–136)
Anion Gap: 10 mmol/L (ref 7–16)
BUN: 29 MG/DL — ABNORMAL HIGH (ref 8–23)
CO2: 23 mmol/L (ref 21–32)
Calcium: 9.2 MG/DL (ref 8.3–10.4)
Chloride: 105 mmol/L (ref 98–107)
Creatinine: 1.44 MG/DL (ref 0.8–1.5)
EGFR IF NonAfrican American: 53 mL/min/{1.73_m2} — ABNORMAL LOW (ref >60)
GFR African American: >60 mL/min/{1.73_m2} (ref >60)
Globulin: 3.5 g/dL (ref 2.3–3.5)
Glucose: 112 mg/dL — ABNORMAL HIGH (ref 65–100)
Potassium: 4.2 mmol/L (ref 3.5–5.1)
Sodium: 138 mmol/L (ref 136–145)
Total Bilirubin: 0.3 MG/DL (ref 0.2–1.1)
Total Protein: 7.7 g/dL (ref 6.3–8.2)

## 2018-04-17 LAB — CBC WITH AUTO DIFFERENTIAL
Basophils %: 1 % (ref 0.0–2.0)
Basophils Absolute: 0 10*3/uL (ref 0.0–0.2)
Eosinophils %: 2 % (ref 0.5–7.8)
Eosinophils Absolute: 0.1 10*3/uL (ref 0.0–0.8)
Granulocyte Absolute Count: 0 10*3/uL (ref 0.0–0.5)
Hematocrit: 41.9 % (ref 41.1–50.3)
Hemoglobin: 14.3 g/dL (ref 13.6–17.2)
Immature Granulocytes: 0 % (ref 0.0–5.0)
Lymphocytes %: 33 % (ref 13–44)
Lymphocytes Absolute: 1.4 10*3/uL (ref 0.5–4.6)
MCH: 31.1 PG (ref 26.1–32.9)
MCHC: 34.1 g/dL (ref 31.4–35.0)
MCV: 91.1 FL (ref 79.6–97.8)
MPV: 10.8 FL (ref 9.4–12.3)
Monocytes %: 13 % — ABNORMAL HIGH (ref 4.0–12.0)
Monocytes Absolute: 0.6 10*3/uL (ref 0.1–1.3)
NRBC Absolute: 0 10*3/uL (ref 0.0–0.2)
Neutrophils %: 52 % (ref 43–78)
Neutrophils Absolute: 2.2 10*3/uL (ref 1.7–8.2)
Platelets: 148 10*3/uL — ABNORMAL LOW (ref 150–450)
RBC: 4.6 M/uL (ref 4.23–5.67)
RDW: 13.3 % (ref 11.9–14.6)
WBC: 4.3 10*3/uL (ref 4.3–11.1)

## 2018-04-17 LAB — D-DIMER, QUANTITATIVE: D-Dimer, Quant: 0.96 ug/ml(FEU) (ref <0.56)

## 2018-04-17 LAB — METABOLIC PANEL, COMPREHENSIVE
A-G Ratio: 1.2 (ref 1.2–3.5)
ALT (SGPT): 39 U/L (ref 12–65)
AST (SGOT): 21 U/L (ref 15–37)
Albumin: 4.2 g/dL (ref 3.2–4.6)
Alk. phosphatase: 89 U/L (ref 50–136)
Anion gap: 10 mmol/L (ref 7–16)
BUN: 29 MG/DL — ABNORMAL HIGH (ref 8–23)
Bilirubin, total: 0.3 MG/DL (ref 0.2–1.1)
CO2: 23 mmol/L (ref 21–32)
Calcium: 9.2 MG/DL (ref 8.3–10.4)
Chloride: 105 mmol/L (ref 98–107)
Creatinine: 1.44 MG/DL (ref 0.8–1.5)
GFR est AA: >60 mL/min/{1.73_m2} (ref >60)
GFR est non-AA: 53 mL/min/{1.73_m2} — ABNORMAL LOW (ref >60)
Globulin: 3.5 g/dL (ref 2.3–3.5)
Glucose: 112 mg/dL — ABNORMAL HIGH (ref 65–100)
Potassium: 4.2 mmol/L (ref 3.5–5.1)
Protein, total: 7.7 g/dL (ref 6.3–8.2)
Sodium: 138 mmol/L (ref 136–145)

## 2018-04-17 LAB — CBC WITH AUTOMATED DIFF
ABS. BASOPHILS: 0 10*3/uL (ref 0.0–0.2)
ABS. EOSINOPHILS: 0.1 10*3/uL (ref 0.0–0.8)
ABS. IMM. GRANS.: 0 10*3/uL (ref 0.0–0.5)
ABS. LYMPHOCYTES: 1.4 10*3/uL (ref 0.5–4.6)
ABS. MONOCYTES: 0.6 10*3/uL (ref 0.1–1.3)
ABS. NEUTROPHILS: 2.2 10*3/uL (ref 1.7–8.2)
ABSOLUTE NRBC: 0 10*3/uL (ref 0.0–0.2)
BASOPHILS: 1 % (ref 0.0–2.0)
EOSINOPHILS: 2 % (ref 0.5–7.8)
HCT: 41.9 % (ref 41.1–50.3)
HGB: 14.3 g/dL (ref 13.6–17.2)
IMMATURE GRANULOCYTES: 0 % (ref 0.0–5.0)
LYMPHOCYTES: 33 % (ref 13–44)
MCH: 31.1 PG (ref 26.1–32.9)
MCHC: 34.1 g/dL (ref 31.4–35.0)
MCV: 91.1 FL (ref 79.6–97.8)
MONOCYTES: 13 % — ABNORMAL HIGH (ref 4.0–12.0)
MPV: 10.8 FL (ref 9.4–12.3)
NEUTROPHILS: 52 % (ref 43–78)
PLATELET: 148 10*3/uL — ABNORMAL LOW (ref 150–450)
RBC: 4.6 M/uL (ref 4.23–5.67)
RDW: 13.3 % (ref 11.9–14.6)
WBC: 4.3 10*3/uL (ref 4.3–11.1)

## 2018-04-17 LAB — D DIMER: D DIMER: 0.96 ug/ml(FEU) — CR (ref <0.56)

## 2018-04-17 NOTE — Progress Notes (Signed)
D dimer  0.96  Read back and verified.  Msg to LenaLindsay, [place]

## 2018-04-17 NOTE — Progress Notes (Signed)
D dimer  0.96  Read back and verified.  Msg to Lindsay, RN

## 2018-04-18 ENCOUNTER — Telehealth

## 2018-04-18 NOTE — Telephone Encounter (Signed)
Patient had to leave before seeing MD. Dr. Rose FillersQuddus reviewed labs. Hgb/hct WNL. Only abnormal was d-dimer. No s/s of blood clots. Will get BLE US to r/o blood clot. Patient notified and verbalized understanding.

## 2018-04-25 ENCOUNTER — Inpatient Hospital Stay: Admit: 2018-04-25 | Payer: BLUE CROSS/BLUE SHIELD | Attending: Hematology & Oncology | Primary: Family Medicine

## 2018-04-25 DIAGNOSIS — M7989 Other specified soft tissue disorders: Secondary | ICD-10-CM

## 2018-04-28 NOTE — Telephone Encounter (Signed)
BLE US negative. LVM wih results for patient and asked him to call back to schedule 6 month follow up with labs.

## 2018-08-29 LAB — HM COLONOSCOPY

## 2018-11-03 ENCOUNTER — Encounter: Payer: BLUE CROSS/BLUE SHIELD | Attending: Clinical Neurophysiology | Primary: Family Medicine

## 2019-01-08 ENCOUNTER — Encounter: Attending: Critical Care Medicine | Primary: Family Medicine

## 2019-08-27 NOTE — Consults (Signed)
Consults  by Vladimir Creeks, MD at 08/27/19 [place]                Author: Vladimir Creeks, MD  Service: RADIOLOGY  Author Type: Physician       Filed: 08/27/19 1405  Date of Service: 08/27/19 1355  Status: Signed          Editor: Vladimir Creeks, MD (Physician)                    Department of Interventional Radiology   [phone]          Consult Note--No charge to patient           Patient: Steven [name]  MRN: [id number]   SSN: [id number]          Date of Birth: 1956/05/10   Age: 63 y.o.   Sex: male         Referring Physician: Dr Izola Price      Consult Date: 08/27/2019                 Innervision MRI 08/10/19 reviewed.  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  I believe the volume is quite small.  Needle aspiration is likely  to return a few drops, at best.  Due to the small size, I do not think that Mr Torry would experience any significant pain improvement w aspiration.        However, if the cysts should enlarge, pain should worsen, or surgery was not an option, then I would try to aspirate.        After discussion w Dr. Izola Price, we will cancel the aspiration procedure.  At this point, the plan is to proceed straight to surgery for repair of the labral tear, etc.        Vladimir Creeks, MD

## 2019-08-27 NOTE — Consults (Signed)
Department of Interventional Radiology  [phone]        Consult Note--No charge to patient     Patient: Steven [name] MRN: [id number]  SSN: [id number]    Date of Birth: 24-Jun-1956  Age: 62 y.o.  Sex: male      Referring Physician: Dr Sedonia Small    Consult Date: 08/27/2019          Innervision MRI 08/10/19 reviewed.  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  I believe the volume is quite small.  Needle aspiration is likely to return a few drops, at best.  Due to the small size, I do not think that [name] would experience any significant pain improvement w aspiration.      However, if the cysts should enlarge, pain should worsen, or surgery was not an option, then I would try to aspirate.      After discussion w Dr. Sedonia Small, we will cancel the aspiration procedure.  At this point, the plan is to proceed straight to surgery for repair of the labral tear, etc.      Lottie Dawson, MD

## 2019-08-28 ENCOUNTER — Ambulatory Visit: Attending: Neurological Surgery | Primary: Family Medicine

## 2019-08-28 ENCOUNTER — Ambulatory Visit
Admit: 2019-08-28 | Discharge: 2019-08-28 | Payer: BLUE CROSS/BLUE SHIELD | Attending: Neurological Surgery | Primary: Family Medicine

## 2019-08-28 DIAGNOSIS — G629 Polyneuropathy, unspecified: Secondary | ICD-10-CM

## 2019-08-28 NOTE — Progress Notes (Signed)
History of Present Illness    Patient Words: 63 y.o.     This patient is a 63 y.o. male who presents today for evaluation of a several year history of numbness and tingling in the feet.  Previous L4-5 lumbar fusion by Dr. Leana Roe 4 years ago with good results.  MRI scanning of the lumbar spine does show spinal stenosis moderate to severe nature at L3-4 with some residual left-sided stenosis at L4-5.  EMG testing of the lower extremities however showed a severe polyneuropathy.  Patient states that he walks at least 4 miles per day 4 to 5 miles per week.    Past Medical History:   Diagnosis Date   ??? Allergic rhinitis, cause unspecified    ??? BMI 40.0-44.9, adult ([place])    ??? Chronic left sacroiliac joint pain    ??? Chronic radicular low back pain         ??? Diverticulitis    ??? Essential hypertension, benign    ??? Generalized anxiety disorder    ??? GERD (gastroesophageal reflux disease)     occ no meds   ??? Hypercholesterolemia    ??? Left lumbar radiculitis     He has had low back pain that radiates down his LLE for years.   ??? 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 Mood disorder ([place]) 07/14/2014   ??? Morbid obesity ([place])     bmi = 40   ??? OSA on CPAP     wears cpap at hs   ??? Polycythemia dx--06/2015    followed by Dr Faith Rogue (heme-onc); "Advised to consider stopping testosterone replacement. Serial monitoring of hct: phlebotomy only if hct over 65%" per office note 10/10/16. Hgb 18-19 since 06/2012.   ??? Prediabetes     does not check BG at home, metformin daily, last hgba1c- 6.5 (05/2016)   ??? Spondylolisthesis at L4-L5 level 10/19/2015   ??? Testicular hypofunction    ??? Thrombophlebitis of left leg (HCC) 04/2015    posterior tibial vein and peroneal vein   ??? Venous insufficiency of both lower extremities 07/14/2014   ??? Vitamin D deficiency              Allergies   Allergen Reactions   ??? Nsaids (Non-Steroidal Anti-Inflammatory Drug) Other (comments)     Cause pedal edema if taken for a number of days in a row         Family History   Problem Relation Age of Onset   ??? Diabetes Mother    ??? Alzheimer Mother    ??? Heart Disease Father         atherosclerosis (heavy smoker)   ??? Diabetes Sister         Social History     Socioeconomic History   ??? Marital status: MARRIED     Spouse name: Not on file   ??? Number of children: Not on file   ??? Years of education: Not on file   ??? Highest education level: Not on file   Occupational History   ??? Not on file   Social Needs   ??? Financial resource strain: Not on file   ??? Food insecurity     Worry: Not on file     Inability: Not on file   ??? Transportation needs     Medical: Not on file     Non-medical: Not on file   Tobacco Use   ??? Smoking status: Never Smoker   ??? Smokeless tobacco: Never  Used   Substance and Sexual Activity   ??? Alcohol use: Yes     Alcohol/week: 1.0 standard drinks     Types: 1 Cans of beer per week     Comment: social   ??? Drug use: No   ??? Sexual activity: Not on file   Lifestyle   ??? Physical activity     Days per week: Not on file     Minutes per session: Not on file   ??? Stress: Not on file   Relationships   ??? Social Wellsite geologist on phone: Not on file     Gets together: Not on file     Attends religious service: Not on file     Active member of club or organization: Not on file     Attends meetings of clubs or organizations: Not on file     Relationship status: Not on file   ??? Intimate partner violence     Fear of current or ex partner: Not on file     Emotionally abused: Not on file     Physically abused: Not on file     Forced sexual activity: Not on file   Other Topics Concern   ??? Not on file   Social History Narrative   ??? Not on file       Current Outpatient Medications   Medication Sig Dispense Refill   ??? olmesartan-hydroCHLOROthiazide (BENICAR HCT) 40-12.5 mg per tablet Take 1 Tab by mouth daily. 90 Tab 1   ??? DULoxetine (CYMBALTA) 30 mg capsule Take 1 Cap by mouth daily. 90 Cap 1   ??? pravastatin (PRAVACHOL) 40 mg tablet TAKE 1 TABLET BY MOUTH AT BEDTIME 90 Tab 1    ??? metFORMIN (GLUCOPHAGE) 1,000 mg tablet TAKE ONE TABLET BY MOUTH TWICE A DAY 180 Tab 1   ??? OMEPRAZOLE PO Take  by mouth as needed.     ??? aspirin (ASPIRIN LOW-STRENGTH) 81 mg chewable tablet Take 81 mg by mouth every morning. Indications: continue     ??? MULTI-VITAMIN HI-PO PO Take 2 Tabs by mouth every morning. Indications: hold until after surgery         Patient Active Problem List   Diagnosis Code   ??? Generalized anxiety disorder F41.1   ??? Testicular hypofunction E29.1   ??? Allergic rhinitis, cause unspecified J30.9   ??? Pre-diabetes R73.03   ??? Essential hypertension, benign I10   ??? Mood disorder (HCC) F39   ??? Venous insufficiency of both lower extremities I87.2   ??? Chronic left sacroiliac joint pain M53.3, G89.29   ??? Chronic radicular low back pain M54.16, G89.29   ??? OSA (obstructive sleep apnea) G47.33   ??? Spondylolisthesis at L4-L5 level M43.16   ??? Spinal stenosis of lumbar region without neurogenic claudication M48.061   ??? GERD (gastroesophageal reflux disease) K21.9   ??? Hypercholesterolemia E78.00   ??? Morbid obesity (HCC) E66.01   ??? Complete tear of right ACL S83.511A   ??? Tear of lateral meniscus of right knee S83.281A   ??? Osteoarthritis of right knee M17.11   ??? Neuropathy G62.9        Review of Systems: A complete ROS was done and as stated in the HPI or otherwise negative.    Vitals:    08/28/19 1000   BP: (!) 146/73   Pulse: 85   Temp: 97.3 ??F (36.3 ??C)   TempSrc: Temporal   SpO2: 96%   PainSc:   2  PainLoc: Back         Physical Exam  The physical exam findings are as follows:       Cranial Nerves:   Intact visual fields. Fundi are benign. PERLA, EOM's full, no nystagmus, no ptosis. Facial sensation is normal. Corneal reflexes are intact. Facial movement is symmetric. Hearing is normal bilaterally. Palate is midline with normal sternocleidomastoid and trapezius muscles are normal. Tongue is midline.   Motor:  5/5 strength in upper and lower proximal and distal muscles. Normal bulk and tone. No  fasciculations.   Reflexes:    Absent lower extremity reflexes   Sensory:   Normal to touch, pinprick and vibration.   Gait:  Normal gait.   Tremor:   No tremor noted.   Cerebellar:  No cerebellar signs present.               Assessment & Plan      ICD-10-CM ICD-9-CM    1. Neuropathy  G62.9 355.9    2. Spondylolisthesis at L4-L5 level  M43.16 756.12    3. Morbid obesity (HCC)  E66.01 278.01    4. Spinal stenosis of lumbar region without neurogenic claudication  M48.061 724.02      Not a surgical situation at this time.  He will follow-up with his primary care provider regarding additional treatment investigation for his lower extremity peripheral neuropathy.  Follow-up here as needed.    Harvie Bridge, MD

## 2019-08-28 NOTE — Progress Notes (Signed)
History of Present Illness    Patient Words: 63 y.o.     This patient is a 63 y.o. male who presents today for evaluation of a several year history of numbness and tingling in the feet.  Previous L4-5 lumbar fusion by Dr. Leana Roe 4 years ago with good results.  MRI scanning of the lumbar spine does show spinal stenosis moderate to severe nature at L3-4 with some residual left-sided stenosis at L4-5.  EMG testing of the lower extremities however showed a severe polyneuropathy.  Patient states that he walks at least 4 miles per day 4 to 5 miles per week.    Past Medical History:   Diagnosis Date   ??? Allergic rhinitis, cause unspecified    ??? BMI 40.0-44.9, adult ([place])    ??? Chronic left sacroiliac joint pain    ??? Chronic radicular low back pain         ??? Diverticulitis    ??? Essential hypertension, benign    ??? Generalized anxiety disorder    ??? GERD (gastroesophageal reflux disease)     occ no meds   ??? Hypercholesterolemia    ??? Left lumbar radiculitis     He has had low back pain that radiates down his LLE for years.   ??? 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 Mood disorder ([place]) 07/14/2014   ??? Morbid obesity ([place])     bmi = 40   ??? OSA on CPAP     wears cpap at hs   ??? Polycythemia dx--06/2015    followed by Dr Faith Rogue (heme-onc); "Advised to consider stopping testosterone replacement. Serial monitoring of hct: phlebotomy only if hct over 65%" per office note 10/10/16. Hgb 18-19 since 06/2012.   ??? Prediabetes     does not check BG at home, metformin daily, last hgba1c- 6.5 (05/2016)   ??? Spondylolisthesis at L4-L5 level 10/19/2015   ??? Testicular hypofunction    ??? Thrombophlebitis of left leg (HCC) 04/2015    posterior tibial vein and peroneal vein   ??? Venous insufficiency of both lower extremities 07/14/2014   ??? Vitamin D deficiency              Allergies   Allergen Reactions   ??? Nsaids (Non-Steroidal Anti-Inflammatory Drug) Other (comments)     Cause pedal edema if taken for a number of days in a row         Family History   Problem Relation Age of Onset   ??? Diabetes Mother    ??? Alzheimer Mother    ??? Heart Disease Father         atherosclerosis (heavy smoker)   ??? Diabetes Sister         Social History     Socioeconomic History   ??? Marital status: MARRIED     Spouse name: Not on file   ??? Number of children: Not on file   ??? Years of education: Not on file   ??? Highest education level: Not on file   Occupational History   ??? Not on file   Social Needs   ??? Financial resource strain: Not on file   ??? Food insecurity     Worry: Not on file     Inability: Not on file   ??? Transportation needs     Medical: Not on file     Non-medical: Not on file   Tobacco Use   ??? Smoking status: Never Smoker   ??? Smokeless tobacco: Never  Used   Substance and Sexual Activity   ??? Alcohol use: Yes     Alcohol/week: 1.0 standard drinks     Types: 1 Cans of beer per week     Comment: social   ??? Drug use: No   ??? Sexual activity: Not on file   Lifestyle   ??? Physical activity     Days per week: Not on file     Minutes per session: Not on file   ??? Stress: Not on file   Relationships   ??? Social Wellsite geologist on phone: Not on file     Gets together: Not on file     Attends religious service: Not on file     Active member of club or organization: Not on file     Attends meetings of clubs or organizations: Not on file     Relationship status: Not on file   ??? Intimate partner violence     Fear of current or ex partner: Not on file     Emotionally abused: Not on file     Physically abused: Not on file     Forced sexual activity: Not on file   Other Topics Concern   ??? Not on file   Social History Narrative   ??? Not on file       Current Outpatient Medications   Medication Sig Dispense Refill   ??? olmesartan-hydroCHLOROthiazide (BENICAR HCT) 40-12.5 mg per tablet Take 1 Tab by mouth daily. 90 Tab 1   ??? DULoxetine (CYMBALTA) 30 mg capsule Take 1 Cap by mouth daily. 90 Cap 1   ??? pravastatin (PRAVACHOL) 40 mg tablet TAKE 1 TABLET BY MOUTH AT BEDTIME 90 Tab 1    ??? metFORMIN (GLUCOPHAGE) 1,000 mg tablet TAKE ONE TABLET BY MOUTH TWICE A DAY 180 Tab 1   ??? OMEPRAZOLE PO Take  by mouth as needed.     ??? aspirin (ASPIRIN LOW-STRENGTH) 81 mg chewable tablet Take 81 mg by mouth every morning. Indications: continue     ??? MULTI-VITAMIN HI-PO PO Take 2 Tabs by mouth every morning. Indications: hold until after surgery         Patient Active Problem List   Diagnosis Code   ??? Generalized anxiety disorder F41.1   ??? Testicular hypofunction E29.1   ??? Allergic rhinitis, cause unspecified J30.9   ??? Pre-diabetes R73.03   ??? Essential hypertension, benign I10   ??? Mood disorder (HCC) F39   ??? Venous insufficiency of both lower extremities I87.2   ??? Chronic left sacroiliac joint pain M53.3, G89.29   ??? Chronic radicular low back pain M54.16, G89.29   ??? OSA (obstructive sleep apnea) G47.33   ??? Spondylolisthesis at L4-L5 level M43.16   ??? Spinal stenosis of lumbar region without neurogenic claudication M48.061   ??? GERD (gastroesophageal reflux disease) K21.9   ??? Hypercholesterolemia E78.00   ??? Morbid obesity (HCC) E66.01   ??? Complete tear of right ACL S83.511A   ??? Tear of lateral meniscus of right knee S83.281A   ??? Osteoarthritis of right knee M17.11   ??? Neuropathy G62.9        Review of Systems: A complete ROS was done and as stated in the HPI or otherwise negative.    Vitals:    08/28/19 1000   BP: (!) 146/73   Pulse: 85   Temp: 97.3 ??F (36.3 ??C)   TempSrc: Temporal   SpO2: 96%   PainSc:   2  PainLoc: Back         Physical Exam  The physical exam findings are as follows:       Cranial Nerves:   Intact visual fields. Fundi are benign. PERLA, EOM's full, no nystagmus, no ptosis. Facial sensation is normal. Corneal reflexes are intact. Facial movement is symmetric. Hearing is normal bilaterally. Palate is midline with normal sternocleidomastoid and trapezius muscles are normal. Tongue is midline.   Motor:  5/5 strength in upper and lower proximal and distal muscles.  Normal bulk and tone. No fasciculations.   Reflexes:    Absent lower extremity reflexes   Sensory:   Normal to touch, pinprick and vibration.   Gait:  Normal gait.   Tremor:   No tremor noted.   Cerebellar:  No cerebellar signs present.               Assessment & Plan      ICD-10-CM ICD-9-CM    1. Neuropathy  G62.9 355.9    2. Spondylolisthesis at L4-L5 level  M43.16 756.12    3. Morbid obesity (HCC)  E66.01 278.01    4. Spinal stenosis of lumbar region without neurogenic claudication  M48.061 724.02      Not a surgical situation at this time.  He will follow-up with his primary care provider regarding additional treatment investigation for his lower extremity peripheral neuropathy.  Follow-up here as needed.    Harvie Bridge, MD

## 2019-09-10 ENCOUNTER — Encounter

## 2019-09-17 NOTE — H&P (Signed)
ADDENDUM- Update 09/29/2019 0720 no interval change  Pre Operative History and Physical Exam    Patient ID:  Steven [name]  [id number]  63 y.o.  1956-10-12    Today: September 17, 2019       Assessment:   1. Right shoulder impingement, ACOA, partial thickness RCT        Plan:    1. Proceed with scheduled Procedure(s) (LRB):  RIGHT SHOULDER ARTHROSCOPY/ SUBACROMIAL DECOMPRESSION/ ROTATOR CUFF REPAIR/ DISTAL CLAVICLE RESECTION GEN/ SCAL (Right)            CC:  Right shoulder pain    HPI:   The patient has  Right shoulder PTRCT by MRI. The patient was evaluated and examined during a consultation prior to this office visit.  There have been no changes to the patient's orthopedic condition since the initial consultation. The patient has failed previous conservative treatment for this condition. The patient will present the day of surgery for Procedure(s) (LRB):  RIGHT SHOULDER ARTHROSCOPY/ SUBACROMIAL DECOMPRESSION/ ROTATOR CUFF REPAIR/ DISTAL CLAVICLE RESECTION GEN/ SCAL (Right)      Past Medical/Surgical History:  Past Medical History:   Diagnosis Date   ??? Allergic rhinitis, cause unspecified    ??? BMI 40.0-44.9, adult ([place])    ??? Chronic left sacroiliac joint pain    ??? Chronic radicular low back pain         ??? Diverticulitis    ??? Essential hypertension, benign    ??? Generalized anxiety disorder    ??? GERD (gastroesophageal reflux disease)     occ no meds   ??? Hypercholesterolemia    ??? Left lumbar radiculitis     He has had low back pain that radiates down his LLE for years.   ??? 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 Mood disorder ([place]) 07/14/2014   ??? Morbid obesity ([place])     bmi = 40   ??? OSA on CPAP     wears cpap at hs   ??? Polycythemia dx--06/2015    followed by Dr Faith Rogue (heme-onc); "Advised to consider stopping testosterone replacement. Serial monitoring of hct: phlebotomy only if hct over 65%" per office note 10/10/16. Hgb 18-19 since 06/2012.   ??? Prediabetes     does not check BG at home, metformin daily,  last hgba1c- 6.5 (05/2016)   ??? Spondylolisthesis at L4-L5 level 10/19/2015   ??? Testicular hypofunction    ??? Thrombophlebitis of left leg (HCC) 04/2015    posterior tibial vein and peroneal vein   ??? Venous insufficiency of both lower extremities 07/14/2014   ??? Vitamin D deficiency           Past Surgical History:   Procedure Laterality Date   ??? ENDOSCOPY, COLON, DIAGNOSTIC  2011    Diverticulosis   ??? HX COLONOSCOPY  05/2015    Diverticulosis   ??? HX KNEE ARTHROSCOPY Bilateral 04/2015    Partial medial and lateral meniscectomies by Dr. Eliseo Squires   ??? HX ORTHOPAEDIC Left 10/19/2015    L4-5 TLIF, decompression. Dr. Raul Del        Allergies:   Allergies   Allergen Reactions   ??? Nsaids (Non-Steroidal Anti-Inflammatory Drug) Other (comments)     Cause pedal edema if taken for a number of days in a row        Physical Exam:   General: NAD, Alert, Oriented, Appears their stated age     75: NC/AT, PERRL    Skin: No rashes, lesions or wounds seen  Psych: normal affect      Heart: Regular Rate, Rhythm     Lungs: unlabored respirations, normal breath sounds     Abdomen: Soft and non-distended     Ortho:  Pain with supraspinatus testing & mild weakness    Neuro: no focal defects, sensation is equal bilaterally     Lymph: no lymphadenopathy     Meds:   No current facility-administered medications for this encounter.      Current Outpatient Medications   Medication Sig   ??? olmesartan-hydroCHLOROthiazide (BENICAR HCT) 40-12.5 mg per tablet Take 1 Tab by mouth daily.   ??? DULoxetine (CYMBALTA) 30 mg capsule Take 1 Cap by mouth daily.   ??? pravastatin (PRAVACHOL) 40 mg tablet TAKE 1 TABLET BY MOUTH AT BEDTIME   ??? metFORMIN (GLUCOPHAGE) 1,000 mg tablet TAKE ONE TABLET BY MOUTH TWICE A DAY   ??? OMEPRAZOLE PO Take  by mouth as needed.   ??? aspirin (ASPIRIN LOW-STRENGTH) 81 mg chewable tablet Take 81 mg by mouth every morning. Indications: continue   ??? MULTI-VITAMIN HI-PO PO Take 2 Tabs by mouth every morning. Indications: hold until after surgery          Labs:  No visits with results within 3 Month(s) from this visit.   Latest known visit with results is:   Hospital Outpatient Visit on 04/17/2018   Component Date Value Ref Range Status   ??? WBC 04/17/2018 4.3  4.3 - 11.1 K/uL Final   ??? RBC 04/17/2018 4.60  4.23 - 5.67 M/uL Final   ??? HGB 04/17/2018 14.3  13.6 - 17.2 g/dL Final   ??? HCT 04/17/2018 41.9  41.1 - 50.3 % Final   ??? MCV 04/17/2018 91.1  79.6 - 97.8 FL Final   ??? MCH 04/17/2018 31.1  26.1 - 32.9 PG Final   ??? MCHC 04/17/2018 34.1  31.4 - 35.0 g/dL Final   ??? RDW 04/17/2018 13.3  11.9 - 14.6 % Final   ??? PLATELET 04/17/2018 148* 150 - 450 K/uL Final   ??? MPV 04/17/2018 10.8  9.4 - 12.3 FL Final   ??? ABSOLUTE NRBC 04/17/2018 0.00  0.0 - 0.2 K/uL Final    **Note: Absolute NRBC parameter is now reported with Hemogram**   ??? DF 04/17/2018 AUTOMATED    Final   ??? NEUTROPHILS 04/17/2018 52  43 - 78 % Final   ??? LYMPHOCYTES 04/17/2018 33  13 - 44 % Final   ??? MONOCYTES 04/17/2018 13* 4.0 - 12.0 % Final   ??? EOSINOPHILS 04/17/2018 2  0.5 - 7.8 % Final   ??? BASOPHILS 04/17/2018 1  0.0 - 2.0 % Final   ??? IMMATURE GRANULOCYTES 04/17/2018 0  0.0 - 5.0 % Final   ??? ABS. NEUTROPHILS 04/17/2018 2.2  1.7 - 8.2 K/UL Final   ??? ABS. LYMPHOCYTES 04/17/2018 1.4  0.5 - 4.6 K/UL Final   ??? ABS. MONOCYTES 04/17/2018 0.6  0.1 - 1.3 K/UL Final   ??? ABS. EOSINOPHILS 04/17/2018 0.1  0.0 - 0.8 K/UL Final   ??? ABS. BASOPHILS 04/17/2018 0.0  0.0 - 0.2 K/UL Final   ??? ABS. IMM. GRANS. 04/17/2018 0.0  0.0 - 0.5 K/UL Final   ??? Sodium 04/17/2018 138  136 - 145 mmol/L Final   ??? Potassium 04/17/2018 4.2  3.5 - 5.1 mmol/L Final   ??? Chloride 04/17/2018 105  98 - 107 mmol/L Final   ??? CO2 04/17/2018 23  21 - 32 mmol/L Final   ??? Anion gap 04/17/2018 10  7 - 16 mmol/L Final   ??? Glucose 04/17/2018 112* 65 - 100 mg/dL Final    Comment: 47 - 60 mg/dl Consistent with, but not fully diagnostic of hypoglycemia.  101 - 125 mg/dl Impaired fasting glucose/consistent with pre-diabetes mellitus  > 126 mg/dl Fasting glucose  consistent with overt diabetes mellitus     ??? BUN 04/17/2018 29* 8 - 23 MG/DL Final   ??? Creatinine 04/17/2018 1.44  0.8 - 1.5 MG/DL Final   ??? GFR est AA 04/17/2018 >60  >60 ml/min/1.27m Final   ??? GFR est non-AA 04/17/2018 53* >60 ml/min/1.768mFinal    Comment: (NOTE)  Estimated GFR is calculated using the Modification of Diet in Renal   Disease (MDRD) Study equation, reported for both African Americans   (GFRAA) and non-African Americans (GFRNA), and normalized to 1.7361m body surface area. The physician must decide which value applies to   the patient. The MDRD study equation should only be used in   individuals age 19 70 older. It has not been validated for the   following: pregnant women, patients with serious comorbid conditions,   or on certain medications, or persons with extremes of body size,   muscle mass, or nutritional status.     ??? Calcium 04/17/2018 9.2  8.3 - 10.4 MG/DL Final   ??? Bilirubin, total 04/17/2018 0.3  0.2 - 1.1 MG/DL Final   ??? ALT (SGPT) 04/17/2018 39  12 - 65 U/L Final   ??? AST (SGOT) 04/17/2018 21  15 - 37 U/L Final   ??? Alk. phosphatase 04/17/2018 89  50 - 136 U/L Final   ??? Protein, total 04/17/2018 7.7  6.3 - 8.2 g/dL Final   ??? Albumin 04/17/2018 4.2  3.2 - 4.6 g/dL Final   ??? Globulin 04/17/2018 3.5  2.3 - 3.5 g/dL Final   ??? A-G Ratio 04/17/2018 1.2  1.2 - 3.5   Final   ??? D DIMER 04/17/2018 0.96* <0.56 ug/ml(FEU) Final    Comment: RESULTS VERIFIED, PHONED TO AND READ BACK BY  MASKELL.T RN ON 04/17/18 @ 1100 BY JP  (NOTE)  A D-Dimer result less than 0.5 ug/mL FEU combined with a low clinical   pretest probability of DVT and/or PE has a negative predictive value   of 90-100%.  The positive predictive value is 50% or less.                   Patient Active Problem List   Diagnosis Code   ??? Generalized anxiety disorder F41.1   ??? Testicular hypofunction E29.1   ??? Allergic rhinitis, cause unspecified J30.9   ??? Pre-diabetes R73.03   ??? Essential hypertension, benign I10   ??? Mood disorder (HCC)  F39   ??? Venous insufficiency of both lower extremities I87.2   ??? Chronic left sacroiliac joint pain M53.3, G89.29   ??? Chronic radicular low back pain M54.16, G89.29   ??? OSA (obstructive sleep apnea) G47.33   ??? Spondylolisthesis at L4-L5 level M43.16   ??? Spinal stenosis of lumbar region without neurogenic claudication M48.061   ??? GERD (gastroesophageal reflux disease) K21.9   ??? Hypercholesterolemia E78.00   ??? Morbid obesity (HCCHico66.01   ??? Complete tear of right ACL S83.511A   ??? Tear of lateral meniscus of right knee S83.281A   ??? Osteoarthritis of right knee M17.11   ??? Neuropathy G62.9         Signed By: WilBonita QuinD  September 17, 2019

## 2019-09-17 NOTE — H&P (Addendum)
ADDENDUM- Update 09/29/2019 0720 no interval change  Pre Operative History and Physical Exam    Patient ID:  Steven [name]  [id number]  63 y.o.  1955/11/13    Today: September 17, 2019       Assessment:   1. Right shoulder impingement, ACOA, partial thickness RCT        Plan:    1. Proceed with scheduled Procedure(s) (LRB):  RIGHT SHOULDER ARTHROSCOPY/ SUBACROMIAL DECOMPRESSION/ ROTATOR CUFF REPAIR/ DISTAL CLAVICLE RESECTION GEN/ SCAL (Right)            CC:  Right shoulder pain    HPI:   The patient has  Right shoulder PTRCT by MRI. The patient was evaluated and examined during a consultation prior to this office visit.  There have been no changes to the patient's orthopedic condition since the initial consultation. The patient has failed previous conservative treatment for this condition. The patient will present the day of surgery for Procedure(s) (LRB):  RIGHT SHOULDER ARTHROSCOPY/ SUBACROMIAL DECOMPRESSION/ ROTATOR CUFF REPAIR/ DISTAL CLAVICLE RESECTION GEN/ SCAL (Right)      Past Medical/Surgical History:  Past Medical History:   Diagnosis Date   ??? Allergic rhinitis, cause unspecified    ??? BMI 40.0-44.9, adult ([place])    ??? Chronic left sacroiliac joint pain    ??? Chronic radicular low back pain         ??? Diverticulitis    ??? Essential hypertension, benign    ??? Generalized anxiety disorder    ??? GERD (gastroesophageal reflux disease)     occ no meds   ??? Hypercholesterolemia    ??? Left lumbar radiculitis     He has had low back pain that radiates down his LLE for years.   ??? 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 Mood disorder ([place]) 07/14/2014   ??? Morbid obesity ([place])     bmi = 40   ??? OSA on CPAP     wears cpap at hs   ??? Polycythemia dx--06/2015    followed by Dr Faith Rogue (heme-onc); "Advised to consider stopping testosterone replacement. Serial monitoring of hct: phlebotomy only if hct over 65%" per office note 10/10/16. Hgb 18-19 since 06/2012.   ??? Prediabetes      does not check BG at home, metformin daily, last hgba1c- 6.5 (05/2016)   ??? Spondylolisthesis at L4-L5 level 10/19/2015   ??? Testicular hypofunction    ??? Thrombophlebitis of left leg (HCC) 04/2015    posterior tibial vein and peroneal vein   ??? Venous insufficiency of both lower extremities 07/14/2014   ??? Vitamin D deficiency           Past Surgical History:   Procedure Laterality Date   ??? ENDOSCOPY, COLON, DIAGNOSTIC  2011    Diverticulosis   ??? HX COLONOSCOPY  05/2015    Diverticulosis   ??? HX KNEE ARTHROSCOPY Bilateral 04/2015    Partial medial and lateral meniscectomies by Dr. Eliseo Squires   ??? HX ORTHOPAEDIC Left 10/19/2015    L4-5 TLIF, decompression. Dr. Raul Del        Allergies:   Allergies   Allergen Reactions   ??? Nsaids (Non-Steroidal Anti-Inflammatory Drug) Other (comments)     Cause pedal edema if taken for a number of days in a row        Physical Exam:   General: NAD, Alert, Oriented, Appears their stated age     71: NC/AT, PERRL    Skin: No rashes, lesions or wounds seen  Psych: normal affect      Heart: Regular Rate, Rhythm     Lungs: unlabored respirations, normal breath sounds     Abdomen: Soft and non-distended     Ortho:  Pain with supraspinatus testing & mild weakness    Neuro: no focal defects, sensation is equal bilaterally     Lymph: no lymphadenopathy     Meds:   No current facility-administered medications for this encounter.      Current Outpatient Medications   Medication Sig   ??? olmesartan-hydroCHLOROthiazide (BENICAR HCT) 40-12.5 mg per tablet Take 1 Tab by mouth daily.   ??? DULoxetine (CYMBALTA) 30 mg capsule Take 1 Cap by mouth daily.   ??? pravastatin (PRAVACHOL) 40 mg tablet TAKE 1 TABLET BY MOUTH AT BEDTIME   ??? metFORMIN (GLUCOPHAGE) 1,000 mg tablet TAKE ONE TABLET BY MOUTH TWICE A DAY   ??? OMEPRAZOLE PO Take  by mouth as needed.   ??? aspirin (ASPIRIN LOW-STRENGTH) 81 mg chewable tablet Take 81 mg by mouth every morning. Indications: continue    ??? MULTI-VITAMIN HI-PO PO Take 2 Tabs by mouth every morning. Indications: hold until after surgery         Labs:  No visits with results within 3 Month(s) from this visit.   Latest known visit with results is:   Hospital Outpatient Visit on 04/17/2018   Component Date Value Ref Range Status   ??? WBC 04/17/2018 4.3  4.3 - 11.1 K/uL Final   ??? RBC 04/17/2018 4.60  4.23 - 5.67 M/uL Final   ??? HGB 04/17/2018 14.3  13.6 - 17.2 g/dL Final   ??? HCT 04/17/2018 41.9  41.1 - 50.3 % Final   ??? MCV 04/17/2018 91.1  79.6 - 97.8 FL Final   ??? MCH 04/17/2018 31.1  26.1 - 32.9 PG Final   ??? MCHC 04/17/2018 34.1  31.4 - 35.0 g/dL Final   ??? RDW 04/17/2018 13.3  11.9 - 14.6 % Final   ??? PLATELET 04/17/2018 148* 150 - 450 K/uL Final   ??? MPV 04/17/2018 10.8  9.4 - 12.3 FL Final   ??? ABSOLUTE NRBC 04/17/2018 0.00  0.0 - 0.2 K/uL Final    **Note: Absolute NRBC parameter is now reported with Hemogram**   ??? DF 04/17/2018 AUTOMATED    Final   ??? NEUTROPHILS 04/17/2018 52  43 - 78 % Final   ??? LYMPHOCYTES 04/17/2018 33  13 - 44 % Final   ??? MONOCYTES 04/17/2018 13* 4.0 - 12.0 % Final   ??? EOSINOPHILS 04/17/2018 2  0.5 - 7.8 % Final   ??? BASOPHILS 04/17/2018 1  0.0 - 2.0 % Final   ??? IMMATURE GRANULOCYTES 04/17/2018 0  0.0 - 5.0 % Final   ??? ABS. NEUTROPHILS 04/17/2018 2.2  1.7 - 8.2 K/UL Final   ??? ABS. LYMPHOCYTES 04/17/2018 1.4  0.5 - 4.6 K/UL Final   ??? ABS. MONOCYTES 04/17/2018 0.6  0.1 - 1.3 K/UL Final   ??? ABS. EOSINOPHILS 04/17/2018 0.1  0.0 - 0.8 K/UL Final   ??? ABS. BASOPHILS 04/17/2018 0.0  0.0 - 0.2 K/UL Final   ??? ABS. IMM. GRANS. 04/17/2018 0.0  0.0 - 0.5 K/UL Final   ??? Sodium 04/17/2018 138  136 - 145 mmol/L Final   ??? Potassium 04/17/2018 4.2  3.5 - 5.1 mmol/L Final   ??? Chloride 04/17/2018 105  98 - 107 mmol/L Final   ??? CO2 04/17/2018 23  21 - 32 mmol/L Final   ??? Anion gap 04/17/2018 10  7 - 16 mmol/L Final   ??? Glucose 04/17/2018 112* 65 - 100 mg/dL Final    Comment: 47 - 60 mg/dl Consistent with, but not fully diagnostic of hypoglycemia.   101 - 125 mg/dl Impaired fasting glucose/consistent with pre-diabetes mellitus  > 126 mg/dl Fasting glucose consistent with overt diabetes mellitus     ??? BUN 04/17/2018 29* 8 - 23 MG/DL Final   ??? Creatinine 04/17/2018 1.44  0.8 - 1.5 MG/DL Final   ??? GFR est AA 04/17/2018 >60  >60 ml/min/1.21m Final   ??? GFR est non-AA 04/17/2018 53* >60 ml/min/1.736mFinal    Comment: (NOTE)  Estimated GFR is calculated using the Modification of Diet in Renal   Disease (MDRD) Study equation, reported for both African Americans   (GFRAA) and non-African Americans (GFRNA), and normalized to 1.7345m body surface area. The physician must decide which value applies to   the patient. The MDRD study equation should only be used in   individuals age 34 36 older. It has not been validated for the   following: pregnant women, patients with serious comorbid conditions,   or on certain medications, or persons with extremes of body size,   muscle mass, or nutritional status.     ??? Calcium 04/17/2018 9.2  8.3 - 10.4 MG/DL Final   ??? Bilirubin, total 04/17/2018 0.3  0.2 - 1.1 MG/DL Final   ??? ALT (SGPT) 04/17/2018 39  12 - 65 U/L Final   ??? AST (SGOT) 04/17/2018 21  15 - 37 U/L Final   ??? Alk. phosphatase 04/17/2018 89  50 - 136 U/L Final   ??? Protein, total 04/17/2018 7.7  6.3 - 8.2 g/dL Final   ??? Albumin 04/17/2018 4.2  3.2 - 4.6 g/dL Final   ??? Globulin 04/17/2018 3.5  2.3 - 3.5 g/dL Final   ??? A-G Ratio 04/17/2018 1.2  1.2 - 3.5   Final   ??? D DIMER 04/17/2018 0.96* <0.56 ug/ml(FEU) Final    Comment: RESULTS VERIFIED, PHONED TO AND READ BACK BY  MASKELL.T RN ON 04/17/18 @ 1100 BY JP  (NOTE)  A D-Dimer result less than 0.5 ug/mL FEU combined with a low clinical   pretest probability of DVT and/or PE has a negative predictive value   of 90-100%.  The positive predictive value is 50% or less.                   Patient Active Problem List   Diagnosis Code   ??? Generalized anxiety disorder F41.1   ??? Testicular hypofunction E29.1    ??? Allergic rhinitis, cause unspecified J30.9   ??? Pre-diabetes R73.03   ??? Essential hypertension, benign I10   ??? Mood disorder (HCC) F39   ??? Venous insufficiency of both lower extremities I87.2   ??? Chronic left sacroiliac joint pain M53.3, G89.29   ??? Chronic radicular low back pain M54.16, G89.29   ??? OSA (obstructive sleep apnea) G47.33   ??? Spondylolisthesis at L4-L5 level M43.16   ??? Spinal stenosis of lumbar region without neurogenic claudication M48.061   ??? GERD (gastroesophageal reflux disease) K21.9   ??? Hypercholesterolemia E78.00   ??? Morbid obesity ([facility]Br[facility]66.01   ??? Complete tear of right ACL S83.511A   ??? Tear of lateral meniscus of right knee S83.281A   ??? Osteoarthritis of right knee M17.11   ??? Neuropathy G62.9         Signed By: WilBonita QuinD  September 17, 2019

## 2019-09-21 ENCOUNTER — Institutional Professional Consult (permissible substitution): Payer: BLUE CROSS/BLUE SHIELD | Primary: Family Medicine

## 2019-09-21 DIAGNOSIS — Z01812 Encounter for preprocedural laboratory examination: Secondary | ICD-10-CM

## 2019-09-21 NOTE — Progress Notes (Signed)
Patient presented to the [facility] for COVID testing as ordered.  After verbal consent given by patient/caregiver,  nasal swab obtained and sent to lab for processing.

## 2019-09-22 LAB — COVID-19: SARS-CoV-2, NAA: NOT DETECTED

## 2019-09-22 LAB — INPATIENT

## 2019-09-22 LAB — NOVEL CORONAVIRUS (COVID-19): SARS-CoV-2, NAA: NOT DETECTED

## 2019-09-22 NOTE — Interval H&P Note (Signed)
Informed Dr. Caffie Pinto of pt having Echo on 09-28-19 for possible heart enlargement. Pt is asymptomatic- instructed to have results available ASAP.

## 2019-09-22 NOTE — Interval H&P Note (Signed)
Received faxed copy of ekg dated 09/08/19 and reading states sinus rhythm with first degree AV block, right budle branch block, left anterior fascicular block.  Given to [facility] preop staff.

## 2019-09-22 NOTE — Interval H&P Note (Signed)
Patient verified name and DOB.    Order for consent yes found in EHR and matches case posting; patient verifies procedure.     Type 1b surgery, pat assessment complete.  Orders yes received.    Labs per surgeon: none  Labs per anesthesia protocol: potasium    PT IS HAVING ECHO 09-28-19[facility] [place]  Patient answered medical/surgical history questions at their best of ability. All prior to admission medications documented in [facility].    Patient instructed to take the following medications the day of surgery according to anesthesia guidelines with a small sip of water  DULOXETINE: Hold all vitamins 7 days prior to surgery and NSAIDS 5 days prior to surgery. Prescription meds to hold:ASPIRIN MULTIVITAMIN    Patient instructed on the following:    > a negative Covid swab result is required to proceed with surgery;  appointments are made by the surgeon office and test should be collected 7 days prior to surgery. The testing center is located at the [facility], [place]. Completed 09-21-19  > 1 visitor allowed at this time.   >Arrive at outpatient Entrance, time of arrival to be called the day before by 1700  >NPO after midnight including gum, mints, and ice chips  >Responsible adult must drive patient to the hospital, stay during surgery, and patient will need supervision 24 hours after anesthesia  >Use dial soap in shower the night before surgery and on the morning of surgery  >All piercings must be removed prior to arrival.    >Leave all valuables (money and jewelry) at home but bring insurance card and ID on       DOS.   >Do not wear make-up, nail polish, lotions, cologne, perfumes, powders, or oil on skin.

## 2019-09-22 NOTE — Interval H&P Note (Signed)
Requested ekg from [facility]-[phone].

## 2019-09-22 NOTE — Interval H&P Note (Signed)
Contacted echo department informed them of pt having echo on 09-28-19- requested STAT reading-pt is having surgery 09-29-19.

## 2019-09-22 NOTE — Other (Signed)
Informed Dr. Getz of pt having Echo on 09-28-19 for possible heart enlargement. Pt is asymptomatic- instructed to have results available ASAP.

## 2019-09-22 NOTE — Other (Signed)
Requested ekg from [facility]-[phone].

## 2019-09-22 NOTE — Other (Signed)
Contacted echo department informed them of pt having echo on 09-28-19- requested STAT reading-pt is having surgery 09-29-19.

## 2019-09-22 NOTE — Other (Signed)
Received faxed copy of ekg dated 09/08/19 and reading states sinus rhythm with first degree AV block, right budle branch block, left anterior fascicular block.  Given to [facility] preop staff.

## 2019-09-22 NOTE — Other (Signed)
Patient verified name and DOB.    Order for consent yes found in EHR and matches case posting; patient verifies procedure.     Type 1b surgery, pat assessment complete.  Orders yes received.    Labs per surgeon: none  Labs per anesthesia protocol: potasium    PT IS HAVING ECHO 09-28-19-[place]  Patient answered medical/surgical history questions at their best of ability. All prior to admission medications documented in [facility].    Patient instructed to take the following medications the day of surgery according to anesthesia guidelines with a small sip of water  DULOXETINE: Hold all vitamins 7 days prior to surgery and NSAIDS 5 days prior to surgery. Prescription meds to hold:ASPIRIN MULTIVITAMIN    Patient instructed on the following:    > a negative Covid swab result is required to proceed with surgery;  appointments are made by the surgeon office and test should be collected 7 days prior to surgery. The testing center is located at the [facility] [address], [place]. Completed 09-21-19  > 1 visitor allowed at this time.   >Arrive at outpatient Entrance, time of arrival to be called the day before by 1700  >NPO after midnight including gum, mints, and ice chips  >Responsible adult must drive patient to the hospital, stay during surgery, and patient will need supervision 24 hours after anesthesia  >Use dial soap in shower the night before surgery and on the morning of surgery  >All piercings must be removed prior to arrival.    >Leave all valuables (money and jewelry) at home but bring insurance card and ID on       DOS.   >Do not wear make-up, nail polish, lotions, cologne, perfumes, powders, or oil on skin.

## 2019-09-23 ENCOUNTER — Inpatient Hospital Stay: Primary: Family Medicine

## 2019-09-28 ENCOUNTER — Inpatient Hospital Stay: Admit: 2019-09-28 | Payer: BLUE CROSS/BLUE SHIELD | Attending: Family Medicine | Primary: Family Medicine

## 2019-09-28 DIAGNOSIS — I517 Cardiomegaly: Secondary | ICD-10-CM

## 2019-09-28 LAB — ECHOCARDIOGRAM COMPLETE 2D W DOPPLER W COLOR: Left Ventricular Ejection Fraction: 60

## 2019-09-28 MED ORDER — PERFLUTREN LIPID MICROSPHERES 1.1 MG/ML IV
1.1 mg/mL | INTRAVENOUS | Status: AC | PRN
Start: 2019-09-28 — End: 2019-09-28
  Administered 2019-09-28: 20:00:00 via INTRAVENOUS

## 2019-09-29 ENCOUNTER — Inpatient Hospital Stay: Payer: BLUE CROSS/BLUE SHIELD

## 2019-09-29 LAB — POCT GLUCOSE: POC Glucose: 153 mg/dL — ABNORMAL HIGH (ref 65–100)

## 2019-09-29 LAB — POC SODIUM-POTASSIUM
Potassium, POC: 3.9 MMOL/L (ref 3.5–5.1)
Potassium, POC: 3.9 MMOL/L (ref 3.5–5.1)

## 2019-09-29 LAB — GLUCOSE, POC: Glucose (POC): 153 mg/dL — ABNORMAL HIGH (ref 65–100)

## 2019-09-29 MED ORDER — ONDANSETRON (PF) 4 MG/2 ML INJECTION
4 mg/2 mL | INTRAMUSCULAR | Status: AC
Start: 2019-09-29 — End: ?

## 2019-09-29 MED ORDER — ACETAMINOPHEN 500 MG TAB
500 mg | Freq: Once | ORAL | Status: DC
Start: 2019-09-29 — End: 2019-09-29

## 2019-09-29 MED ORDER — SODIUM CHLORIDE 0.9 % IJ SYRG
INTRAMUSCULAR | Status: DC | PRN
Start: 2019-09-29 — End: 2019-09-29

## 2019-09-29 MED ORDER — DIPHENHYDRAMINE HCL 50 MG/ML IJ SOLN
50 mg/mL | INTRAMUSCULAR | Status: DC | PRN
Start: 2019-09-29 — End: 2019-09-29

## 2019-09-29 MED ORDER — EPINEPHRINE (PF) 1 MG/ML INJECTION
1 mg/mL ( mL) | INTRAMUSCULAR | Status: AC
Start: 2019-09-29 — End: ?

## 2019-09-29 MED ORDER — SODIUM CHLORIDE 0.9 % IJ SYRG
Freq: Three times a day (TID) | INTRAMUSCULAR | Status: DC
Start: 2019-09-29 — End: 2019-09-29

## 2019-09-29 MED ORDER — MIDAZOLAM 1 MG/ML IJ SOLN
1 mg/mL | Freq: Once | INTRAMUSCULAR | Status: DC | PRN
Start: 2019-09-29 — End: 2019-09-29

## 2019-09-29 MED ORDER — CEFAZOLIN 3 GRAM/30 ML IN STERILE WATER INTRAVENOUS SYRINGE
3 gram/0 mL | INTRAVENOUS | Status: AC
Start: 2019-09-29 — End: 2019-09-29
  Administered 2019-09-29: 12:00:00 via INTRAVENOUS

## 2019-09-29 MED ORDER — DEXAMETHASONE SODIUM PHOSPHATE 4 MG/ML IJ SOLN
4 mg/mL | INTRAMUSCULAR | Status: AC
Start: 2019-09-29 — End: ?

## 2019-09-29 MED ORDER — PROPOFOL 10 MG/ML IV EMUL
10 mg/mL | INTRAVENOUS | Status: AC
Start: 2019-09-29 — End: ?

## 2019-09-29 MED ORDER — NALOXONE 0.4 MG/ML INJECTION
0.4 mg/mL | INTRAMUSCULAR | Status: DC | PRN
Start: 2019-09-29 — End: 2019-09-29

## 2019-09-29 MED ORDER — PHENYLEPHRINE 10 MG/ML INJECTION
10 mg/mL | INTRAMUSCULAR | Status: DC | PRN
Start: 2019-09-29 — End: 2019-09-29
  Administered 2019-09-29 (×4): via INTRAVENOUS

## 2019-09-29 MED ORDER — LACTATED RINGERS IV
INTRAVENOUS | Status: DC
Start: 2019-09-29 — End: 2019-09-29
  Administered 2019-09-29 (×2): via INTRAVENOUS

## 2019-09-29 MED ORDER — MIDAZOLAM 1 MG/ML IJ SOLN
1 mg/mL | Freq: Once | INTRAMUSCULAR | Status: AC
Start: 2019-09-29 — End: 2019-09-29
  Administered 2019-09-29: 12:00:00 via INTRAVENOUS

## 2019-09-29 MED ORDER — EPINEPHRINE (PF) 1 MG/ML INJECTION
1 mg/mL ( mL) | INTRAMUSCULAR | Status: DC | PRN
Start: 2019-09-29 — End: 2019-09-29
  Administered 2019-09-29: 12:00:00

## 2019-09-29 MED ORDER — LACTATED RINGERS IV
INTRAVENOUS | Status: DC
Start: 2019-09-29 — End: 2019-09-29

## 2019-09-29 MED ORDER — HYDROMORPHONE (PF) 2 MG/ML IJ SOLN
2 mg/mL | INTRAMUSCULAR | Status: DC | PRN
Start: 2019-09-29 — End: 2019-09-29

## 2019-09-29 MED ORDER — OXYCODONE 5 MG TAB
5 mg | Freq: Once | ORAL | Status: DC | PRN
Start: 2019-09-29 — End: 2019-09-29

## 2019-09-29 MED ORDER — DEXAMETHASONE SODIUM PHOSPHATE 4 MG/ML IJ SOLN
4 mg/mL | INTRAMUSCULAR | Status: DC | PRN
Start: 2019-09-29 — End: 2019-09-29
  Administered 2019-09-29: 12:00:00 via PERINEURAL
  Administered 2019-09-29 (×2): via INTRAVENOUS

## 2019-09-29 MED ORDER — FENTANYL CITRATE (PF) 50 MCG/ML IJ SOLN
50 mcg/mL | Freq: Once | INTRAMUSCULAR | Status: AC
Start: 2019-09-29 — End: 2019-09-29
  Administered 2019-09-29: 12:00:00 via INTRAVENOUS

## 2019-09-29 MED ORDER — LIDOCAINE (PF) 20 MG/ML (2 %) IJ SOLN
20 mg/mL (2 %) | INTRAMUSCULAR | Status: DC | PRN
Start: 2019-09-29 — End: 2019-09-29
  Administered 2019-09-29: 12:00:00 via INTRAVENOUS

## 2019-09-29 MED ORDER — ROPIVACAINE (PF) 5 MG/ML (0.5 %) INJECTION
5 mg/mL (0. %) | INTRAMUSCULAR | Status: DC | PRN
Start: 2019-09-29 — End: 2019-09-29
  Administered 2019-09-29: 12:00:00 via PERINEURAL

## 2019-09-29 MED ORDER — LIDOCAINE (PF) 20 MG/ML (2 %) IJ SOLN
20 mg/mL (2 %) | INTRAMUSCULAR | Status: AC
Start: 2019-09-29 — End: ?

## 2019-09-29 MED ORDER — FLUMAZENIL 0.1 MG/ML IV SOLN
0.1 mg/mL | INTRAVENOUS | Status: DC | PRN
Start: 2019-09-29 — End: 2019-09-29

## 2019-09-29 MED ORDER — OXYCODONE-ACETAMINOPHEN 5 MG-325 MG TAB
5-325 mg | ORAL_TABLET | ORAL | 0 refills | Status: AC | PRN
Start: 2019-09-29 — End: 2019-10-06

## 2019-09-29 MED ORDER — LIDOCAINE HCL 1 % (10 MG/ML) IJ SOLN
10 mg/mL (1 %) | INTRAMUSCULAR | Status: DC | PRN
Start: 2019-09-29 — End: 2019-09-29

## 2019-09-29 MED ORDER — ONDANSETRON (PF) 4 MG/2 ML INJECTION
4 mg/2 mL | INTRAMUSCULAR | Status: DC | PRN
Start: 2019-09-29 — End: 2019-09-29
  Administered 2019-09-29 (×2): via INTRAVENOUS

## 2019-09-29 MED ORDER — EPHEDRINE 50 MG/5 ML (10 MG/ML) IN NS IV SYRINGE
50 mg/5 mL (10 mg/mL) | INTRAVENOUS | Status: DC | PRN
Start: 2019-09-29 — End: 2019-09-29
  Administered 2019-09-29: 14:00:00 via INTRAVENOUS

## 2019-09-29 MED ORDER — SODIUM CHLORIDE 0.9 % INJECTION
25 mg/mL | INTRAMUSCULAR | Status: DC | PRN
Start: 2019-09-29 — End: 2019-09-29

## 2019-09-29 MED ORDER — PROPOFOL 10 MG/ML IV EMUL
10 mg/mL | INTRAVENOUS | Status: DC | PRN
Start: 2019-09-29 — End: 2019-09-29
  Administered 2019-09-29: 12:00:00 via INTRAVENOUS

## 2019-09-29 MED FILL — XYLOCAINE-MPF 20 MG/ML (2 %) INJECTION SOLUTION: 20 mg/mL (2 %) | INTRAMUSCULAR | Qty: 5

## 2019-09-29 MED FILL — DEXAMETHASONE SODIUM PHOSPHATE 4 MG/ML IJ SOLN: 4 mg/mL | INTRAMUSCULAR | Qty: 1

## 2019-09-29 MED FILL — FENTANYL CITRATE (PF) 50 MCG/ML IJ SOLN: 50 mcg/mL | INTRAMUSCULAR | Qty: 2

## 2019-09-29 MED FILL — CEFAZOLIN 3 GRAM/30 ML IN STERILE WATER INTRAVENOUS SYRINGE: 3 gram/0 mL | INTRAVENOUS | Qty: 30

## 2019-09-29 MED FILL — PROPOFOL 10 MG/ML IV EMUL: 10 mg/mL | INTRAVENOUS | Qty: 40

## 2019-09-29 MED FILL — MIDAZOLAM 1 MG/ML IJ SOLN: 1 mg/mL | INTRAMUSCULAR | Qty: 2

## 2019-09-29 MED FILL — EPINEPHRINE (PF) 1 MG/ML INJECTION: 1 mg/mL ( mL) | INTRAMUSCULAR | Qty: 2

## 2019-09-29 MED FILL — ONDANSETRON (PF) 4 MG/2 ML INJECTION: 4 mg/2 mL | INTRAMUSCULAR | Qty: 2

## 2019-09-29 NOTE — Anesthesia Procedure Notes (Signed)
Peripheral Block    Start time: 09/29/2019 6:55 AM  End time: 09/29/2019 6:57 AM  Performed by: Rosalee Kaufman, MD  Authorized by: Rosalee Kaufman, MD       Pre-procedure:   Indications: at surgeon's request and post-op pain management    Preanesthetic Checklist: patient identified, risks and benefits discussed, site marked, timeout performed, anesthesia consent given and patient being monitored    Timeout Time: 06:53          Block Type:   Block Type:  Interscalene  Laterality:  Right  Monitoring:  Continuous pulse ox, frequent vital sign checks, heart rate, responsive to questions and oxygen  Injection Technique:  Single shot  Procedures: ultrasound guided    Prep: chlorhexidine    Location:  Interscalene  Needle Type:  Stimuplex  Needle Gauge:  20 G  Needle Localization:  Anatomical landmarks, infiltration and ultrasound guidance    Assessment:  Number of attempts:  1  Injection Assessment:  Incremental injection every 5 mL, negative aspiration for CSF, local visualized surrounding nerve on ultrasound, negative aspiration for blood, no intravascular symptoms, no paresthesia and ultrasound image on chart  Patient tolerance:  Patient tolerated the procedure well with no immediate complications

## 2019-09-29 NOTE — Anesthesia Pre-Procedure Evaluation (Signed)
Relevant Problems   No relevant active problems       Anesthetic History   No history of anesthetic complications            Review of Systems / Medical History  Patient summary reviewed and pertinent labs reviewed    Pulmonary        Sleep apnea: CPAP           Neuro/Psych   Within defined limits           Cardiovascular    Hypertension              Exercise tolerance: >4 METS  Comments: ECHO - preserved EF    GI/Hepatic/Renal     GERD           Endo/Other        Obesity and arthritis     Other Findings              Physical Exam    Airway  Mallampati: II  TM Distance: 4 - 6 cm  Neck ROM: normal range of motion   Mouth opening: Normal     Cardiovascular    Rhythm: regular  Rate: normal         Dental         Pulmonary  Breath sounds clear to auscultation               Abdominal         Other Findings            Anesthetic Plan    ASA: 3  Anesthesia type: general      Post-op pain plan if not by surgeon: peripheral nerve block single    Induction: Intravenous  Anesthetic plan and risks discussed with: Patient and Spouse

## 2019-09-29 NOTE — Anesthesia Post-Procedure Evaluation (Signed)
Procedure(s):  RIGHT SHOULDER ARTHROSCOPY/ SUBACROMIAL DECOMPRESSION/ ROTATOR CUFF REPAIR WITH PATCH/ DISTAL CLAVICLE RESECTION.    general    Anesthesia Post Evaluation      Multimodal analgesia: multimodal analgesia used between 6 hours prior to anesthesia start to PACU discharge  Patient location during evaluation: PACU  Patient participation: complete - patient participated  Level of consciousness: awake and alert  Pain management: adequate  Airway patency: patent  Anesthetic complications: no  Cardiovascular status: acceptable and hemodynamically stable  Respiratory status: acceptable  Hydration status: acceptable  Post anesthesia nausea and vomiting:  none  Final Post Anesthesia Temperature Assessment:  Normothermia (36.0-37.5 degrees C)      INITIAL Post-op Vital signs:   Vitals Value Taken Time   BP 114/57 09/29/2019  9:23 AM   Temp 36.6 ??C (97.8 ??F) 09/29/2019  9:14 AM   Pulse 78 09/29/2019  9:25 AM   Resp 16 09/29/2019  9:23 AM   SpO2 94 % 09/29/2019  9:25 AM   Vitals shown include unvalidated device data.

## 2019-09-29 NOTE — Op Note (Signed)
[place] [facility]  OPERATIVE REPORT    Name:  Steven [name], Steven [name]  MR#:  [id number]  DOB:  [birth year]  ACCOUNT #:  [id number]  DATE OF SERVICE:  09/29/2019    PREOPERATIVE DIAGNOSES:  Right shoulder impingement, superior labrum anterior and posterior tear, partial-thickness rotator cuff tear and acromioclavicular arthritis.    POSTOPERATIVE DIAGNOSES:  Right shoulder impingement, superior labrum anterior and posterior tear, partial-thickness rotator cuff tear and acromioclavicular arthritis plus glenohumeral arthritis.    PROCEDURE PERFORMED:  Right shoulder arthroscopy, arthroscopic biceps tenotomy, arthroscopic glenohumeral debridement, arthroscopic subacromial decompression, arthroscopic distal clavicle resection, and mini open rotator cuff repair with Regeneten patch.    SURGEON:  Satira Anis. Izola Price, MD    ASSISTANT:  None.    ANESTHESIA:  General with regional.    COMPLICATIONS:  None.    SPECIMENS REMOVED:  None.    IMPLANTS:  Smith and Nephew Regeneten rotator cuff patch with associated anchors.    ESTIMATED BLOOD LOSS:  Minimal.    MEDICATIONS:  None.    PROCEDURE IN DETAIL:  After discussion of risks, benefits and alternatives, the patient expressed understanding and strongly desired to proceed with surgical treatment.  He was brought to the operating room.  After verification of the patient's site, side, diagnosis and procedure, anesthesia was completed.  He was placed in the beach chair position with the bony prominences well-padded.  The right shoulder was brought through a full range of glenohumeral motion.  No limitation of glenohumeral motion was noted.  The right shoulder was then prepped and draped in the normal sterile fashion and placed in the traction apparatus.  Time-out was performed.  Diagnostic arthroscopy demonstrated moderate glenohumeral degenerative change consistent with preoperative imaging.  SLAP tear was noted on imaging as well.  This was demonstrated arthroscopically and a  biceps tenotomy was performed.  A partial-thickness rotator cuff tear was identified, which demonstrated about 30-40% thickness of the supraspinatus portion of the tendon from the articular side.  No full-thickness rotator cuff tears were identified.  Attention was turned to the subacromial space.  There was significant tendinopathy demonstrated from the bursal surface of the rotator cuff as well.  The acromion and distal clavicle both appeared to be potential sources of impingement.  Arthroscopic subacromial decompression and distal clavicle resection were performed.  Distal clavicle coplaning was performed after the resection in order to avoid residual source of osseous impingement from the residual distal clavicle.  A mini open deltoid splitting incision was then created and rotator cuff repair was performed with a Regeneten patch with two lateral bone anchors and multiple medial tendon anchors.  Excellent position of the patch was accomplished.  The wound was copiously irrigated and closed in layered fashion.  The patient tolerated the procedure well.  There were no complications.  No specimens were sent to Pathology.    POSTOPERATIVE PLAN:  Regeneten protocol.  Follow up in 2 weeks for wound check and suture removal.      Bonita Quin, MD      [id number]  D:  09/29/2019 9:05  T:  09/29/2019 10:59  JOB #:  [id number]

## 2019-09-29 NOTE — Anesthesia Pre-Procedure Evaluation (Signed)
Relevant Problems   No relevant active problems       Anesthetic History   No history of anesthetic complications            Review of Systems / Medical History  Patient summary reviewed and pertinent labs reviewed    Pulmonary        Sleep apnea: CPAP           Neuro/Psych   Within defined limits           Cardiovascular    Hypertension              Exercise tolerance: >4 METS  Comments: ECHO - preserved EF    GI/Hepatic/Renal     GERD           Endo/Other        Obesity and arthritis     Other Findings              Physical Exam    Airway  Mallampati: II  TM Distance: 4 - 6 cm  Neck ROM: normal range of motion   Mouth opening: Normal     Cardiovascular    Rhythm: regular  Rate: normal         Dental         Pulmonary  Breath sounds clear to auscultation               Abdominal         Other Findings            Anesthetic Plan    ASA: 3  Anesthesia type: general      Post-op pain plan if not by surgeon: peripheral nerve block single    Induction: Intravenous  Anesthetic plan and risks discussed with: Patient and Spouse

## 2019-09-29 NOTE — Op Note (Signed)
[facility]  OPERATIVE REPORT    Name:  Steven [name], Steven [name]  MR#:  [id number]  DOB:  05/01/1956  ACCOUNT #:  [id number]  DATE OF SERVICE:  09/29/2019    PREOPERATIVE DIAGNOSES:  Right shoulder impingement, superior labrum anterior and posterior tear, partial-thickness rotator cuff tear and acromioclavicular arthritis.    POSTOPERATIVE DIAGNOSES:  Right shoulder impingement, superior labrum anterior and posterior tear, partial-thickness rotator cuff tear and acromioclavicular arthritis plus glenohumeral arthritis.    PROCEDURE PERFORMED:  Right shoulder arthroscopy, arthroscopic biceps tenotomy, arthroscopic glenohumeral debridement, arthroscopic subacromial decompression, arthroscopic distal clavicle resection, and mini open rotator cuff repair with Regeneten patch.    SURGEON:  Jamillah Camilo M. Brittnae Aschenbrenner, MD    ASSISTANT:  None.    ANESTHESIA:  General with regional.    COMPLICATIONS:  None.    SPECIMENS REMOVED:  None.    IMPLANTS:  Smith and Nephew Regeneten rotator cuff patch with associated anchors.    ESTIMATED BLOOD LOSS:  Minimal.    MEDICATIONS:  None.    PROCEDURE IN DETAIL:  After discussion of risks, benefits and alternatives, the patient expressed understanding and strongly desired to proceed with surgical treatment.  He was brought to the operating room.  After verification of the patient's site, side, diagnosis and procedure, anesthesia was completed.  He was placed in the beach chair position with the bony prominences well-padded.  The right shoulder was brought through a full range of glenohumeral motion.  No limitation of glenohumeral motion was noted.  The right shoulder was then prepped and draped in the normal sterile fashion and placed in the traction apparatus.  Time-out was performed.  Diagnostic arthroscopy demonstrated moderate glenohumeral degenerative change consistent with preoperative imaging.  SLAP tear was noted on imaging as well.  This was demonstrated arthroscopically and a  biceps tenotomy was performed.  A partial-thickness rotator cuff tear was identified, which demonstrated about 30-40% thickness of the supraspinatus portion of the tendon from the articular side.  No full-thickness rotator cuff tears were identified.  Attention was turned to the subacromial space.  There was significant tendinopathy demonstrated from the bursal surface of the rotator cuff as well.  The acromion and distal clavicle both appeared to be potential sources of impingement.  Arthroscopic subacromial decompression and distal clavicle resection were performed.  Distal clavicle coplaning was performed after the resection in order to avoid residual source of osseous impingement from the residual distal clavicle.  A mini open deltoid splitting incision was then created and rotator cuff repair was performed with a Regeneten patch with two lateral bone anchors and multiple medial tendon anchors.  Excellent position of the patch was accomplished.  The wound was copiously irrigated and closed in layered fashion.  The patient tolerated the procedure well.  There were no complications.  No specimens were sent to Pathology.    POSTOPERATIVE PLAN:  Regeneten protocol.  Follow up in 2 weeks for wound check and suture removal.      Tacy Chavis M Naethan Bracewell, MD      [id number]  D:  09/29/2019 9:05  T:  09/29/2019 10:59  JOB #:  [id number]

## 2019-09-29 NOTE — Anesthesia Procedure Notes (Signed)
Peripheral Block    Start time: 09/29/2019 6:55 AM  End time: 09/29/2019 6:57 AM  Performed by: Marshayla Mitschke C, MD  Authorized by: Feliberto Stockley C, MD       Pre-procedure:   Indications: at surgeon's request and post-op pain management    Preanesthetic Checklist: patient identified, risks and benefits discussed, site marked, timeout performed, anesthesia consent given and patient being monitored    Timeout Time: 06:53          Block Type:   Block Type:  Interscalene  Laterality:  Right  Monitoring:  Continuous pulse ox, frequent vital sign checks, heart rate, responsive to questions and oxygen  Injection Technique:  Single shot  Procedures: ultrasound guided    Prep: chlorhexidine    Location:  Interscalene  Needle Type:  Stimuplex  Needle Gauge:  20 G  Needle Localization:  Anatomical landmarks, infiltration and ultrasound guidance    Assessment:  Number of attempts:  1  Injection Assessment:  Incremental injection every 5 mL, negative aspiration for CSF, local visualized surrounding nerve on ultrasound, negative aspiration for blood, no intravascular symptoms, no paresthesia and ultrasound image on chart  Patient tolerance:  Patient tolerated the procedure well with no immediate complications

## 2019-09-29 NOTE — Anesthesia Post-Procedure Evaluation (Signed)
Procedure(s):  RIGHT SHOULDER ARTHROSCOPY/ SUBACROMIAL DECOMPRESSION/ ROTATOR CUFF REPAIR WITH PATCH/ DISTAL CLAVICLE RESECTION.    general    Anesthesia Post Evaluation      Multimodal analgesia: multimodal analgesia used between 6 hours prior to anesthesia start to PACU discharge  Patient location during evaluation: PACU  Patient participation: complete - patient participated  Level of consciousness: awake and alert  Pain management: adequate  Airway patency: patent  Anesthetic complications: no  Cardiovascular status: acceptable and hemodynamically stable  Respiratory status: acceptable  Hydration status: acceptable  Post anesthesia nausea and vomiting:  none  Final Post Anesthesia Temperature Assessment:  Normothermia (36.0-37.5 degrees C)      INITIAL Post-op Vital signs:   Vitals Value Taken Time   BP 114/57 09/29/2019  9:23 AM   Temp 36.6 ??C (97.8 ??F) 09/29/2019  9:14 AM   Pulse 78 09/29/2019  9:25 AM   Resp 16 09/29/2019  9:23 AM   SpO2 94 % 09/29/2019  9:25 AM   Vitals shown include unvalidated device data.

## 2019-09-30 NOTE — Addendum Note (Signed)
Addendum         created 09/30/19 1044 by Marcille Blanco, CRNA                   Flowsheet accepted, Intraprocedure Flowsheets edited

## 2019-09-30 NOTE — Addendum Note (Signed)
Addendum  created 09/30/19 1044 by Marcille Blanco, CRNA    Flowsheet accepted, Intraprocedure Flowsheets edited

## 2019-10-02 ENCOUNTER — Ambulatory Visit
Admission: EM | Admit: 2019-10-02 | Discharge: 2019-10-02 | Disposition: A | Discharge status: Home / Self Care | Payer: BLUE CROSS/BLUE SHIELD | Attending: Family Medicine | Admitting: Family Medicine

## 2019-10-02 ENCOUNTER — Other Ambulatory Visit: Payer: Self-pay

## 2019-10-02 ENCOUNTER — Encounter: Payer: Self-pay | Admitting: Emergency Medicine

## 2019-10-02 DIAGNOSIS — T148XXA Other injury of unspecified body region, initial encounter: Secondary | ICD-10-CM | POA: Diagnosis not present

## 2019-10-02 DIAGNOSIS — M7981 Nontraumatic hematoma of soft tissue: Secondary | ICD-10-CM

## 2019-10-02 HISTORY — DX: Situs inversus: Q89.3

## 2019-10-02 HISTORY — DX: Unspecified asthma, uncomplicated: J45.909

## 2019-10-02 HISTORY — DX: Allergy, unspecified, initial encounter: T78.40XA

## 2019-10-02 HISTORY — DX: Essential (primary) hypertension: I10

## 2019-10-02 HISTORY — DX: Gastro-esophageal reflux disease without esophagitis: K21.9

## 2019-10-02 NOTE — Discharge Instructions (Signed)
Rest, relieve pressure, heat, over the counter analgesic/pain medication as needed

## 2019-10-02 NOTE — ED Triage Notes (Signed)
Patient in today c/o fall on 09/27/19 and left leg pain under his left buttock. Patient fall on a metal box scraper and landed on his left upper thigh.

## 2019-10-02 NOTE — ED Provider Notes (Signed)
[facility]    CSN: [id number] Arrival date & time: 10/02/19  1431      History   Chief Complaint Chief Complaint  Patient presents with  . Fall    DOI 09/27/19  . Leg Pain    left    HPI Jose Liu is a 63 y.o. male.   63 yo male presents with a c/o left upper leg pain to the back of his leg since injuring it 6 days ago. States he fell backwards and hit the back of his left upper leg area, just below the buttock, against a metal box scraper. States when he's standing and walking it feels fine and does not have pain, however when he sits down and there's pressure on the back of the leg it hurts. Denies any numbness/tingling.    Fall  Leg Pain   Past Medical History:  Diagnosis Date  . Allergies   . Asthma   . GERD (gastroesophageal reflux disease)   . Hypertension   . Situs inversus     There are no active problems to display for this patient.   Past Surgical History:  Procedure Laterality Date  . CARDIAC ELECTROPHYSIOLOGY STUDY AND ABLATION    . EYE SURGERY         Home Medications    Prior to Admission medications   Medication Sig Start Date End Date Taking? Authorizing Provider  fluticasone (FLONASE) 50 MCG/ACT nasal spray Place 2 sprays into both nostrils daily.   Yes [provider]  lisinopril-hydrochlorothiazide (ZESTORETIC) 10-12.5 MG tablet Take 1 tablet by mouth daily.   Yes [provider]  montelukast (SINGULAIR) 10 MG tablet Take 10 mg by mouth daily.   Yes [provider]  RABEprazole (ACIPHEX) 20 MG tablet Take 20 mg by mouth daily.   Yes [provider]  UNABLE TO FIND Zolair injection monthly for allergie   Yes [provider]    Family History Family History  Problem Relation Age of Onset  . CAD Mother        quadruple CABG  . Stroke Mother   . Hypertension Mother   . Dementia Father   . Prostate cancer Father     Social History Social History   Tobacco Use  .  Smoking status: Never Smoker  . Smokeless tobacco: Never Used  Substance Use Topics  . Alcohol use: Yes    Comment: rarely  . Drug use: Never     Allergies   Patient has no known allergies.   Review of Systems Review of Systems   Physical Exam Triage Vital Signs ED Triage Vitals [10/02/19 1450]  Enc Vitals Group     BP 124/61     Pulse Rate 80     Resp 18     Temp 98.8 F (37.1 C)     Temp Source Oral     SpO2 99 %     Weight (!) 320 lb (145.2 kg)     Height 5\' 11"  (1.803 m)     Head Circumference      Peak Flow      Pain Score 10     Pain Loc      Pain Edu?      Excl. in [place]?    No data found.  Updated Vital Signs BP 124/61 (BP Location: Left Arm)   Pulse 80   Temp 98.8 F (37.1 C) (Oral)   Resp 18   Ht 5\' 11"  (1.803 m)  Wt (!) 145.2 kg   SpO2 99%   BMI 44.63 kg/m   Visual Acuity Right Eye Distance:   Left Eye Distance:   Bilateral Distance:    Right Eye Near:   Left Eye Near:    Bilateral Near:     Physical Exam Vitals signs and nursing note reviewed.  Constitutional:      General: He is not in acute distress.    Appearance: He is not toxic-appearing or diaphoretic.  Musculoskeletal:     Left upper leg: He exhibits tenderness (over approx 6x6cm large hematoma on posterior upper leg) and swelling.       Legs:     Comments: Left lower extremity neurovascularly intact  Neurological:     Mental Status: He is alert.      UC Treatments / Results  Labs (all labs ordered are listed, but only abnormal results are displayed) Labs Reviewed - No data to display  EKG   Radiology No results found.  Procedures Procedures (including critical care time)  Medications Ordered in UC Medications - No data to display  Initial Impression / Assessment and Plan / UC Course  I have reviewed the triage vital signs and the nursing notes.  Pertinent labs & imaging results that were available during my care of the patient were reviewed by me and  considered in my medical decision making (see chart for details).      Final Clinical Impressions(s) / UC Diagnoses   Final diagnoses:  Contusion of muscle  Hematoma     Discharge Instructions     Rest, relieve pressure, heat, over the counter analgesic/pain medication as needed    ED Prescriptions    None      1. diagnosis reviewed with patient 2. Recommend supportive treatment as above 3. Follow-up prn if symptoms worsen or don't improve   PDMP not reviewed this encounter.   Norval Gable, MD 10/02/19 1958

## 2019-10-08 ENCOUNTER — Inpatient Hospital Stay: Admit: 2019-10-08 | Payer: BLUE CROSS/BLUE SHIELD | Primary: Family Medicine

## 2019-10-08 DIAGNOSIS — M25511 Pain in right shoulder: Secondary | ICD-10-CM

## 2019-10-08 NOTE — Progress Notes (Signed)
 Progress Notes by [name], PT at 10/08/19 0930                Author: [name], PT  Service: Physical Therapy  Author Type: Physical Therapist       Filed: 10/08/19 1030  Date of Service: 10/08/19 0930  Status: Addendum          Editor: [name], PT (Physical Therapist)          Related Notes: Original Note by [name], PT (Physical Therapist) filed at 10/08/19  1028               Steven [name]   DOB: 11-Oct-1956   Primary: [name]*   Secondary:   [facility]   [address], [place] [address]   Phone:[phone]   Fax:[phone]                 OUTPATIENT PHYSICAL THERAPY: Daily Treatment Note 10/08/2019   Visit Count:  1       ICD-10: Treatment Diagnosis:      Pain in right shoulder (M25.511)     Stiffness of right shoulder, not elsewhere classified (M25.611)   Precautions/Allergies:    Nsaids (non-steroidal anti-inflammatory drug)    TREATMENT PLAN:   Effective Dates: 10/08/2019 TO 01/06/2020 (90 days).  Frequency/Duration : 2 times a week for 90 Day(s)      Pre-treatment Symptoms/Complaints:  10/08/2019 : Patient reports he is doing well today.    Pain:  Initial: Pain Intensity 1: 5   Pain Location 1: Shoulder   Pain Orientation 1: Right   Pain Intervention(s) 1: Rest, Medication (see MAR)   Post Session:  5/10        Medications Last Reviewed:  10/08/2019   Updated Objective Findings:   See evaluation note from today   TREATMENT:           THERAPEUTIC EXERCISE: (15 minutes):  Exercises per grid below to improve mobility and strength.  Required minimal visual, verbal and manual cues to promote proper body alignment, promote proper  body posture and promote proper body mechanics.  Progressed resistance, range, repetitions and complexity of movement as indicated.     Date:   10/08/2019      Activity/Exercise  Parameters      Pulleys  To tolerance in flexion   HEP      Scapular retraction  10 reps to  tolerance   HEP      Shoulder flexion  AAROM with wand   10 reps to tolerance   HEP      Shoulder external rotation  AAROM with wand   10 reps to tolerance   HEP           MANUAL THERAPY: (10 minutes): Joint mobilization, Soft tissue mobilization and PROM to R UE was utilized and necessary because of the patient's restricted joint motion, loss of articular motion  and restricted motion of soft tissue.         Treatment/Session Summary:       Response to Treatment:  Patient tolerated assessment without complaints of  increased right shoulder pain.  Patient verbalized and demonstrated understanding of HEP.     Communication/Consultation:   None today     Equipment provided today:   None today     Recommendations/Intent for next treatment session: Next visit will focus on improving overall mobility and strength in R UE  with daily activities.      Total Treatment Billable Duration:  25 minutes + evaluation   PT Patient Time In/Time Out   Time In: 0930   Time Out: 1015   [name], PT      Future Appointments      Date  Time  Provider  Department  Center      10/13/2019   8:45 AM  [name], PTA  [facility]  [facility]      10/15/2019   8:45 AM  [name], PTA  [facility]  [facility]      10/20/2019   9:30 AM  [name], PT  [facility]  [facility]      10/21/2019   8:45 AM  [name], PT  [facility]  [facility]      10/27/2019   1:45 PM  [name], PTA  [facility]  [facility]      10/28/2019  10:15 AM  Christin, Shona PARAS, PTA  [facility]  [facility]

## 2019-10-08 NOTE — Other (Signed)
Therapy Evaluation by Allene Dillon, PT at 10/08/19 0930                Author: Allene Dillon, PT  Service: Physical Therapy  Author Type: Physical Therapist       Filed: 10/08/19 1028  Date of Service: 10/08/19 0930  Status: Signed          Editor: Allene Dillon, PT (Physical Therapist)                  Steven [name]   DOB: [birth year]   Primary: Steven [name] Essentials*   Secondary:   [facility]   [address], [address], [place] [address]   Phone:[phone]   Fax:[phone]                      OUTPATIENT PHYSICAL THERAPY:Initial Assessment 10/08/2019         ICD-10: Treatment Diagnosis:      Pain in right shoulder (M25.511)     Stiffness of right shoulder, not elsewhere classified (M25.611)   Precautions/Allergies:    Nsaids (non-steroidal anti-inflammatory drug)    TREATMENT PLAN:   Effective Dates: 10/08/2019 TO 01/06/2020 (90 days).  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 presents with decreased mobility, decreased strength, and pain in right shoulder secondary to recent RCR of R UE.  After discussing with patient, he agreed he would  benefit from physical therapy to improve above deficits.  Please sign this plan of treatment if you concur.  Thank you for the opportunity to serve this patient.             PROBLEM LIST (Impacting functional limitations):   1.  Decreased Strength   2.  Decreased ADL/Functional Activities   3.  Increased Pain   4.  Decreased Activity Tolerance  INTERVENTIONS PLANNED: (Treatment may consist of any combination of the  following)   1.  Cold   2.  Heat   3.  Home Exercise Program (HEP)   4.   Manual Therapy   5.  Neuromuscular Re-education/Strengthening   6.  Range of Motion (ROM)   7.  Therapeutic Activites   8.  Therapeutic Exercise/Strengthening          GOALS: (Goals have been discussed and agreed upon with patient.)   Short-Term Functional Goals: Time Frame: 30 days   1.  Patient will be independent with home exercise program without exacerbation of symptoms or cueing needed.    2.  Patient will be independent with correct sleeping positions and awareness/avoidance of aggravating positions without cueing needed.    Discharge Goals: Time Frame: 90 days   1.  Patient will be independent with all ADLs with minimal onset of right shoulder pain and no deficits with daily tasks.   2.  Patient will report no fear avoidance with social or recreational activities due to right shoulder pain.   3.  Patient will score less than or equal to 12/55 on Quick DASH with minimal effect of right shoulder pain on patient's ability to manage  every day life activities.      OUTCOME MEASURE:    Tool Used: Disabilities of the Arm, Shoulder and Hand (DASH)  Questionnaire - Quick Version   Score:   Initial: 21/55    Most Recent: X/55 (Date: -- )        Interpretation of Score: The DASH is designed to measure the activities  of daily living in person's with upper extremity dysfunction or pain.  Each section is scored on a 1-5 scale, 5 representing the greatest disability.  The scores of each section are added together for a total score of 55.        MEDICAL NECESSITY:      Patient is expected to demonstrate progress in strength and range of motion to improve safety during daily activities.     Patient demonstrates good rehab potential due to higher previous functional level.     Skilled intervention continues to be required due to decreased mobility.   REASON FOR SERVICES/OTHER COMMENTS:     Patient continues to demonstrate capacity to improve overall functional mobility which will increase independence and increase   safety.   Total Duration:   PT Patient Time In/Time Out   Time In: 0930   Time Out: 1015      Rehabilitation Potential For Stated Goals: Good   Regarding Marolyn HammockMichael L Goetzinger's therapy, I certify that the treatment plan above will be carried out by a therapist or under their direction.   Thank you for this referral,   Allene DillonJennifer N Sams, PT      Referring Physician Signature: Claretha Cooperoberson, William M, MD No Signature is Required for this note.          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  He reports he was walking the trail with his dog, when his dog lunged and he felt it in his right shoulder.  He reports that he had surgery a week ago.  He reports he feels he has been doing well.  He reports that he hasn't really taken  any pain medication.  He reports that he wears the sling at night and when he goes out, but otherwise just takes it easy.  He reports that he feels he is doing well overall and is glad the surgery went well.   Past Medical History/Comorbidities:    [name]  has a past medical history of Allergic rhinitis, cause unspecified, BMI 40.0-44.9, adult [facility](HCC), Chronic left sacroiliac joint pain, Chronic radicular low back pain, Diverticulitis,  Essential hypertension, benign, Generalized anxiety disorder, GERD (gastroesophageal reflux disease), Hypercholesterolemia, Left lumbar radiculitis, Lumbar stenosis with neurogenic claudication (11/06/2015), Mood disorder (HCC) (07/14/2014), Morbid obesity  (HCC), OSA on CPAP, Polycythemia (dx--06/2015), Prediabetes, Spondylolisthesis at L4-L5 level (10/19/2015), Testicular hypofunction, Thrombophlebitis of  left leg (HCC) (04/2015), Venous insufficiency of both lower extremities (07/14/2014), and Vitamin D  deficiency.  [name]  has a past surgical history that includes endoscopy, colon, diagnostic (2011); hx colonoscopy (05/2015); hx knee arthroscopy (Bilateral, 04/2015); and hx orthopaedic (Left, 10/19/2015).  Social History/Living Environment:    Home Environment: Private residence   Living Alone: No   Support Systems: Family member(s), Friends \ neighbors   Prior Level of Function/Work/Activity:   Independent   Dominant Side:          RIGHT   Personal Factors:           Sex:  male         Age:  63 y.o.       Ambulatory/Rehab Services H2 Model Falls Risk Assessment        Risk Factors:        (1)  Gender [Male]  Ability to Rise from Chair:        (0)  Ability to rise in a single movement       Falls Prevention Plan:        No modifications necessary        Total: (5 or greater = High Risk):  1       2010 [employer]. All Rights Reserved. [place] [employer] engineer [phone]. Federal Law prohibits the replication, distribution or use without written  permission from [employer]       Current Medications:              Current Outpatient Medications:    ?  meclizine (ANTIVERT) 25 mg tablet, Take 25 mg by mouth three (3) times daily as needed for Dizziness., Disp: , Rfl:    ?  atorvastatin (LIPITOR) 40 mg tablet, Take  by mouth nightly., Disp: , Rfl:    ?  melatonin 3 mg tablet, Take  by mouth nightly., Disp: , Rfl:    ?  olmesartan-hydroCHLOROthiazide (BENICAR HCT) 40-12.5 mg per tablet, Take 1 Tab by mouth daily., Disp: 90 Tab, Rfl: 1   ?  DULoxetine (CYMBALTA) 30 mg capsule, Take 1 Cap by mouth daily., Disp: 90 Cap, Rfl: 1   ?  metFORMIN (GLUCOPHAGE) 1,000 mg tablet, TAKE ONE TABLET BY MOUTH TWICE A DAY, Disp: 180 Tab, Rfl: 1   ?  OMEPRAZOLE PO, Take 20 mg by mouth nightly., Disp: , Rfl:    ?  aspirin (ASPIRIN LOW-STRENGTH) 81 mg chewable tablet, Take 81 mg by mouth every morning. Indications:  continue, Disp: , Rfl:    ?  MULTI-VITAMIN HI-PO PO, Take 2 Tabs by mouth every morning. Indications: hold until after surgery, Disp: , Rfl:        Date Last Reviewed:  10/08/2019         Number of Personal Factors/Comorbidities that affect the Plan of Care:  1-2: MODERATE COMPLEXITY        EXAMINATION:       Observation/Orthostatic Postural Assessment:           Posture Assessment: Rounded shoulders, Forward head          Bruising noted along anterior upper right arm.  Steri-strips noted along incision sites.   Palpation:           Mild tenderness to palpation noted along area of bruising post surgery.     ROM:           R UE Assessment (PROM):     Shoulder Flexion: 100 degrees     Shoulder Extension: 10 degrees     Shoulder Abduction: 60 degrees     Shoulder Adduction: 0 degrees     Shoulder Internal rotation: 70 degrees with UE at 0 degrees abduction     Shoulder External rotation:  50 degrees with UE at 0 degrees abduction     Elbow Flexion: 110 degrees     Elbow Extension: 0 degrees   Strength:           R UE Assessment (Strength):     Shoulder Flexion: 2/5 with manual muscle testing     Shoulder Extension: 2/5 with manual muscle testing     Shoulder Abduction: 2/5 with manual muscle testing     Shoulder Adduction: 2/5 with manual muscle testing     Shoulder Internal rotation: 2/5 with manual muscle testing     Shoulder External rotation: 2/5 with manual muscle testing     Elbow Flexion: 3/5 with manual muscle testing     Elbow Extension: 3/5 with manual muscle testing      Special Tests:           Negative   Neurological Screen:         Dermatomes:  Within normal limits         Reflexes:  2+   Functional Mobility:    Gait/Mobility:          Independent              Transfers:         Sit to Stand: Independent     Stand to Sit: Independent     Stand Pivot Transfers: Independent     Bed to Chair: Independent     Lateral Transfers: Independent              Bed Mobility:         Rolling:  Independent     Supine to Sit: Independent     Sit to Supine: Independent     Scooting: Independent                   Body Structures Involved:   1.  Joints   2.  Muscles  Body Functions Affected:   1.  Sensory/Pain   2.  Movement Related  Activities and Participation Affected:   1.  Mobility   2.  Self Care        Number of elements (examined above) that affect the Plan of Care:  4+: HIGH COMPLEXITY        CLINICAL PRESENTATION:        Presentation:  Evolving clinical presentation with changing clinical characteristics: MODERATE COMPLEXITY        CLINICAL DECISION MAKING:        Use of outcome tool(s) and clinical judgement create a POC that gives a:  Questionable prediction of patient's progress: MODERATE COMPLEXITY

## 2019-10-08 NOTE — Other (Signed)
Steven DuboisMichael L [name]  DOB: 1955/11/29  Primary: [employer]*  Secondary:  [facility]  [address], [address], [place] [id number]  [phone]   [phone]          OUTPATIENT PHYSICAL THERAPY:Initial Assessment 10/08/2019   ICD-10: Treatment Diagnosis:   ?? Pain in right shoulder (M25.511)  ?? Stiffness of right shoulder, not elsewhere classified (M25.611)  Precautions/Allergies:   Nsaids (non-steroidal anti-inflammatory drug)   TREATMENT PLAN:  Effective Dates: 10/08/2019 TO 01/06/2020 (90 days).  Frequency/Duration: 2 times a week for 90 Day(s) MEDICAL/REFERRING DIAGNOSIS:  Impingement syndrome of right shoulder [M75.41]  Pain in right shoulder [M25.511]   DATE OF ONSET: Surgery: September 30, 2019  REFERRING PHYSICIAN: Claretha Cooperoberson, William M, MD  MD Orders: Evaluate and Treat  Return MD Appointment: January 2021     INITIAL ASSESSMENT:  [name] presents with decreased mobility, decreased strength, and pain in right shoulder secondary to recent RCR of R UE.  After discussing with patient, he agreed he would benefit from physical therapy to improve above deficits.  Please sign this plan of treatment if you concur.  Thank you for the opportunity to serve this patient.       PROBLEM LIST (Impacting functional limitations):  1. Decreased Strength  2. Decreased ADL/Functional Activities  3. Increased Pain  4. Decreased Activity Tolerance INTERVENTIONS PLANNED: (Treatment may consist of any combination of the following)  1. Cold  2. Heat  3. Home Exercise Program (HEP)  4. Manual Therapy  5. Neuromuscular Re-education/Strengthening  6. Range of Motion (ROM)  7. Therapeutic Activites  8. Therapeutic Exercise/Strengthening     GOALS: (Goals have been discussed and agreed upon with patient.)  Short-Term Functional Goals: Time Frame: 30 days  1. Patient will be independent with home exercise program without exacerbation of symptoms or cueing needed.    2. Patient will be independent with correct sleeping positions and awareness/avoidance of aggravating positions without cueing needed.   Discharge Goals: Time Frame: 90 days  1. Patient will be independent with all ADLs with minimal onset of right shoulder pain and no deficits with daily tasks.  2. Patient will report no fear avoidance with social or recreational activities due to right shoulder pain.  3. Patient will score less than or equal to 12/55 on Quick DASH with minimal effect of right shoulder pain on patient's ability to manage every day life activities.    OUTCOME MEASURE:   Tool Used: Disabilities of the Arm, Shoulder and Hand (DASH) Questionnaire - Quick Version  Score:  Initial: 21/55  Most Recent: X/55 (Date: -- )   Interpretation of Score: The DASH is designed to measure the activities of daily living in person's with upper extremity dysfunction or pain.  Each section is scored on a 1-5 scale, 5 representing the greatest disability.  The scores of each section are added together for a total score of 55.      MEDICAL NECESSITY:   ?? Patient is expected to demonstrate progress in strength and range of motion to improve safety during daily activities.  ?? Patient demonstrates good rehab potential due to higher previous functional level.  ?? Skilled intervention continues to be required due to decreased mobility.  REASON FOR SERVICES/OTHER COMMENTS:  ?? Patient continues to demonstrate capacity to improve overall functional mobility which will increase independence and increase safety.  Total Duration:  PT Patient Time In/Time Out  Time In: 0930  Time Out: 1015  Rehabilitation Potential For Stated Goals: Good  Regarding Steven [name]'s therapy, I certify that the treatment plan above will be carried out by a therapist or under their direction.  Thank you for this referral,  Allene Dillon, PT     Referring Physician Signature: Claretha Cooper, MD No Signature is Required for this note.      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  He reports he was walking the trail with his dog, when his dog lunged and he felt it in his right shoulder.  He reports that he had surgery a week ago.  He reports he feels he has been doing well.  He reports that he hasn't really taken any pain medication.  He reports that he wears the sling at night and when he goes out, but otherwise just takes it easy.  He reports that he feels he is doing well overall and is glad the surgery went well.  Past Medical History/Comorbidities:   Steven [name]  has a past medical history of Allergic rhinitis, cause unspecified, BMI 40.0-44.9, adult [facility]), Chronic left sacroiliac joint pain, Chronic radicular low back pain, Diverticulitis, Essential hypertension, benign, Generalized anxiety disorder, GERD (gastroesophageal reflux disease), Hypercholesterolemia, Left lumbar radiculitis, Lumbar stenosis with neurogenic claudication (11/06/2015), Mood disorder (HCC) (07/14/2014), Morbid obesity (HCC), OSA on CPAP, Polycythemia (dx--06/2015), Prediabetes, Spondylolisthesis at L4-L5 level (10/19/2015), Testicular hypofunction, Thrombophlebitis of left leg (HCC) (04/2015), Venous insufficiency of both lower extremities (07/14/2014), and Vitamin D deficiency.  Steven [name]  has a past surgical history that includes endoscopy, colon, diagnostic (2011); hx colonoscopy (05/2015); hx knee arthroscopy (Bilateral, 04/2015); and hx orthopaedic (Left, 10/19/2015).  Social History/Living Environment:   Home Environment: Private residence  Living Alone: No  Support Systems: Family member(s), Friends \\ neighbors   Prior Level of Function/Work/Activity:  Independent  Dominant Side:         RIGHT  Personal Factors:          Sex:  male        Age:  64 y.o.   Ambulatory/Rehab Services H2 Model Falls Risk Assessment   Risk Factors:       (1)  Gender [Male] Ability to Rise from Chair:       (0)  Ability to rise in a single movement   Falls Prevention Plan:       No modifications necessary   Total: (5 or greater = High Risk): 1   ??2010 [employer]. All Rights Reserved. [place] Building services engineer [phone]. Federal Law prohibits the replication, distribution or use without written permission from [employer]   Current Medications:       Current Outpatient Medications:   ???  meclizine (ANTIVERT) 25 mg tablet, Take 25 mg by mouth three (3) times daily as needed for Dizziness., Disp: , Rfl:   ???  atorvastatin (LIPITOR) 40 mg tablet, Take  by mouth nightly., Disp: , Rfl:   ???  melatonin 3 mg tablet, Take  by mouth nightly., Disp: , Rfl:   ???  olmesartan-hydroCHLOROthiazide (BENICAR HCT) 40-12.5 mg per tablet, Take 1 Tab by mouth daily., Disp:  90 Tab, Rfl: 1  ???  DULoxetine (CYMBALTA) 30 mg capsule, Take 1 Cap by mouth daily., Disp: 90 Cap, Rfl: 1  ???  metFORMIN (GLUCOPHAGE) 1,000 mg tablet, TAKE ONE TABLET BY MOUTH TWICE A DAY, Disp: 180 Tab, Rfl: 1  ???  OMEPRAZOLE PO, Take 20 mg by mouth nightly., Disp: , Rfl:   ???  aspirin (ASPIRIN LOW-STRENGTH) 81 mg chewable tablet, Take 81 mg by mouth every morning. Indications: continue, Disp: , Rfl:   ???  MULTI-VITAMIN HI-PO PO, Take 2 Tabs by mouth every morning. Indications: hold until after surgery, Disp: , Rfl:    Date Last Reviewed:  10/08/2019    Number of Personal Factors/Comorbidities that affect the Plan of Care: 1-2: MODERATE COMPLEXITY   EXAMINATION:   Observation/Orthostatic Postural Assessment:          Posture Assessment: Rounded shoulders, Forward head         Bruising noted along anterior upper right arm.  Steri-strips noted along incision sites.  Palpation:           Mild tenderness to palpation noted along area of bruising post surgery.    ROM:          R UE Assessment (PROM):  ?? Shoulder Flexion: 100 degrees  ?? Shoulder Extension: 10 degrees  ?? Shoulder Abduction: 60 degrees  ?? Shoulder Adduction: 0 degrees  ?? Shoulder Internal rotation: 70 degrees with UE at 0 degrees abduction  ?? Shoulder External rotation: 50 degrees with UE at 0 degrees abduction  ?? Elbow Flexion: 110 degrees  ?? Elbow Extension: 0 degrees  Strength:          R UE Assessment (Strength):  ?? Shoulder Flexion: 2/5 with manual muscle testing  ?? Shoulder Extension: 2/5 with manual muscle testing  ?? Shoulder Abduction: 2/5 with manual muscle testing  ?? Shoulder Adduction: 2/5 with manual muscle testing  ?? Shoulder Internal rotation: 2/5 with manual muscle testing  ?? Shoulder External rotation: 2/5 with manual muscle testing  ?? Elbow Flexion: 3/5 with manual muscle testing  ?? Elbow Extension: 3/5 with manual muscle testing    Special Tests:          Negative  Neurological Screen:        Dermatomes:  Within normal limits        Reflexes:  2+  Functional Mobility:   Gait/Mobility:    ??   Independent         Transfers:     ?? Sit to Stand: Independent  ?? Stand to Sit: Independent  ?? Stand Pivot Transfers: Independent  ?? Bed to Chair: Independent  ?? Lateral Transfers: Independent         Bed Mobility:     ?? Rolling: Independent  ?? Supine to Sit: Independent  ?? Sit to Supine: Independent  ?? Scooting: Independent        Body Structures Involved:  1. Joints  2. Muscles Body Functions Affected:  1. Sensory/Pain  2. Movement Related Activities and Participation Affected:  1. Mobility  2. Self Care   Number of elements (examined above) that affect the Plan of Care: 4+: HIGH COMPLEXITY   CLINICAL PRESENTATION:   Presentation: Evolving clinical presentation with changing clinical characteristics: MODERATE COMPLEXITY   CLINICAL DECISION MAKING:   Use of outcome tool(s) and clinical judgement create a POC that gives a:  Questionable prediction of patient's progress: MODERATE COMPLEXITY

## 2019-10-08 NOTE — Progress Notes (Addendum)
Steven [name]  DOB: 01-Sep-1956  Primary: [employer] Essentials*  Secondary:  [facility] at [facility]  [address], [address], [place]  Phone:[phone]   [phone]        OUTPATIENT PHYSICAL THERAPY: Daily Treatment Note 10/08/2019  Visit Count:  1    ICD-10: Treatment Diagnosis:   ?? Pain in right shoulder (M25.511)  ?? Stiffness of right shoulder, not elsewhere classified (M25.611)  Precautions/Allergies:   Nsaids (non-steroidal anti-inflammatory drug)   TREATMENT PLAN:  Effective Dates: 10/08/2019 TO 01/06/2020 (90 days).  Frequency/Duration: 2 times a week for 90 Day(s)    Pre-treatment Symptoms/Complaints:  10/08/2019: Patient reports he is doing well today.   Pain: Initial: Pain Intensity 1: 5  Pain Location 1: Shoulder  Pain Orientation 1: Right  Pain Intervention(s) 1: Rest, Medication (see MAR)  Post Session:  5/10   Medications Last Reviewed:  10/08/2019  Updated Objective Findings:  See evaluation note from today  TREATMENT:     THERAPEUTIC EXERCISE: (15 minutes):  Exercises per grid below to improve mobility and strength.  Required minimal visual, verbal and manual cues to promote proper body alignment, promote proper body posture and promote proper body mechanics.  Progressed resistance, range, repetitions and complexity of movement as indicated.   Date:  10/08/2019   Activity/Exercise Parameters   Pulleys To tolerance in flexion  HEP   Scapular retraction 10 reps to tolerance  HEP   Shoulder flexion AAROM with wand  10 reps to tolerance  HEP   Shoulder external rotation AAROM with wand  10 reps to tolerance  HEP     MANUAL THERAPY: (10 minutes): Joint mobilization, Soft tissue mobilization and PROM to R UE was utilized and necessary because of the patient's restricted joint motion, loss of articular motion and restricted motion of soft tissue.       Treatment/Session Summary:    ?? Response to Treatment:  Patient tolerated assessment without complaints  of increased right shoulder pain.  Patient verbalized and demonstrated understanding of HEP.  ?? Communication/Consultation:  None today  ?? Equipment provided today:  None today  ?? Recommendations/Intent for next treatment session: Next visit will focus on improving overall mobility and strength in R UE with daily activities.    Total Treatment Billable Duration:  25 minutes + evaluation  PT Patient Time In/Time Out  Time In: 0930  Time Out: [place], PT    Future Appointments   Date Time Provider [place]   10/13/2019  8:45 AM Severiano Gilbert, PTA [facility] SFE   10/15/2019  8:45 AM Severiano Gilbert, PTA [facility] S[facility]   10/20/2019  9:30 AM [name], PT [facility]   10/21/2019  8:45 AM Rudell Cobb, PT [facility]   10/27/2019  1:45 PM Severiano Gilbert, PTA [facility]   10/28/2019 10:15 AM Governor Specking, Carlos Levering, PTA [facility] SFE

## 2019-10-13 NOTE — Progress Notes (Signed)
Cx-Sick  Roderic Palau [employer] PTA

## 2019-10-13 NOTE — Progress Notes (Signed)
Cx-Sick  Steven [name] PTA

## 2019-10-14 ENCOUNTER — Inpatient Hospital Stay: Payer: BLUE CROSS/BLUE SHIELD | Primary: Family Medicine

## 2019-10-15 ENCOUNTER — Encounter: Payer: BLUE CROSS/BLUE SHIELD | Primary: Family Medicine

## 2019-10-20 ENCOUNTER — Inpatient Hospital Stay: Admit: 2019-10-20 | Payer: BLUE CROSS/BLUE SHIELD | Primary: Family Medicine

## 2019-10-20 NOTE — Progress Notes (Signed)
 Progress Notes by [name], PT at 10/20/19 9268                Author: [name], PT  Service: Physical Therapy  Author Type: Physical Therapist       Filed: 10/20/19 1023  Date of Service: 10/20/19 0731  Status: Signed          Editor: [name], PT (Physical Therapist)               Ozell LITTIE Rea   DOB: 1956-01-28   Primary: [name] Essentials*   Secondary:   [facility] at [facility]   [address], [place] [address]   Phone:[phone]   Fax:[phone]                 OUTPATIENT PHYSICAL THERAPY: Daily Treatment Note 10/20/2019   Visit Count:  2       ICD-10: Treatment Diagnosis:      Pain in right shoulder (M25.511)     Stiffness of right shoulder, not elsewhere classified (M25.611)   Precautions/Allergies:    Nsaids (non-steroidal anti-inflammatory drug)    TREATMENT PLAN:   Effective Dates: 10/08/2019 TO 01/06/2020 (90 days).  Frequency/Duration : 2 times a week for 90 Day(s)      Pre-treatment Symptoms/Complaints:  10/20/2019 : Patient reports he is feeling pretty good, just sore.   Pain:  Initial: Pain Intensity 1: 5   Pain Location 1: Shoulder   Pain Orientation 1: Right   Pain Intervention(s) 1: Rest, Medication (see MAR)   Post Session:  5/10        Medications Last Reviewed:  10/20/2019   Updated Objective Findings:   None Today   TREATMENT:           THERAPEUTIC EXERCISE: (30 minutes):  Exercises per grid below to improve mobility and strength.  Required minimal visual, verbal and manual cues to promote proper body alignment, promote proper  body posture and promote proper body mechanics.  Progressed resistance, range, repetitions and complexity of movement as indicated.     Date:   10/20/2019      Activity/Exercise  Parameters      Pulleys  To tolerance in flexion   10 reps      Wall ladder  5 reps to tolerance      Theraband rows  Red t-band   15 reps      Theraband pulls  Red t-band   15 reps      Theraband internal  rotation  Red t-band   15 reps   R UE      Theraband external rotation  Red t-band   15 reps   R UE      Flexion  Supine with wand   15 reps      External rotation  Supine with wand   15 reps           MANUAL THERAPY: (10 minutes): Joint mobilization, Soft tissue mobilization and PROM to R UE was utilized and necessary because of the patient's restricted joint motion, loss of articular motion  and restricted motion of soft tissue.         Treatment/Session Summary:       Response to Treatment:  Patient tolerated treatment well with improving overall  mobility noted.     Communication/Consultation:   None today     Equipment provided today:   None today     Recommendations/Intent for  next treatment session: Next visit will focus on improving overall mobility and strength in R UE with daily activities.      Total Treatment Billable Duration:  40 minutes    PT Patient Time In/Time Out   Time In: 0930   Time Out: 1015   [name], PT      Future Appointments      Date  Time  Provider  Department  Center      10/20/2019   9:30 AM  [name], PT  [facility]  SFE      10/21/2019   8:45 AM  [name], PT  [facility]  SFE      10/27/2019   1:45 PM  [name], PTA  [facility]  SFE      10/28/2019  10:15 AM  Christin, Shona PARAS, PTA  [facility]  SFE

## 2019-10-20 NOTE — Progress Notes (Signed)
Steven [name]  DOB: 09-14-1956  Primary: [employer]*  Secondary:  [facility]rapy [facility] at [facility]  [address], [address], [place]  Phone:[phone]   [phone]        OUTPATIENT PHYSICAL THERAPY: Daily Treatment Note 10/20/2019  Visit Count:  2    ICD-10: Treatment Diagnosis:   ?? Pain in right shoulder (M25.511)  ?? Stiffness of right shoulder, not elsewhere classified (M25.611)  Precautions/Allergies:   Nsaids (non-steroidal anti-inflammatory drug)   TREATMENT PLAN:  Effective Dates: 10/08/2019 TO 01/06/2020 (90 days).  Frequency/Duration: 2 times a week for 90 Day(s)    Pre-treatment Symptoms/Complaints:  10/20/2019: Patient reports he is feeling pretty good, just sore.  Pain: Initial: Pain Intensity 1: 5  Pain Location 1: Shoulder  Pain Orientation 1: Right  Pain Intervention(s) 1: Rest, Medication (see MAR)  Post Session:  5/10   Medications Last Reviewed:  10/20/2019  Updated Objective Findings:  None Today  TREATMENT:     THERAPEUTIC EXERCISE: (30 minutes):  Exercises per grid below to improve mobility and strength.  Required minimal visual, verbal and manual cues to promote proper body alignment, promote proper body posture and promote proper body mechanics.  Progressed resistance, range, repetitions and complexity of movement as indicated.   Date:  10/20/2019   Activity/Exercise Parameters   Pulleys To tolerance in flexion  10 reps   Wall ladder 5 reps to tolerance   Theraband rows Red t-band  15 reps   Theraband pulls Red t-band  15 reps   Theraband internal rotation Red t-band  15 reps  R UE   Theraband external rotation Red t-band  15 reps  R UE   Flexion Supine with wand  15 reps   External rotation Supine with wand  15 reps      MANUAL THERAPY: (10 minutes): Joint mobilization, Soft tissue mobilization and PROM to R UE was utilized and necessary because of the patient's restricted joint motion, loss of articular motion and restricted motion of soft tissue.       Treatment/Session Summary:    ?? Response to Treatment:  Patient tolerated treatment well with improving overall mobility noted.  ?? Communication/Consultation:  None today  ?? Equipment provided today:  None today  ?? Recommendations/Intent for next treatment session: Next visit will focus on improving overall mobility and strength in R UE with daily activities.    Total Treatment Billable Duration:  40 minutes   PT Patient Time In/Time Out  Time In: 0930  Time Out: [place], PT    Future Appointments   Date Time Provider [place]   10/20/2019  9:30 AM [name], PT [facility] SFE   10/21/2019  8:45 AM Rudell Cobb, PT [facility]   10/27/2019  1:45 PM Severiano Gilbert, PTA [facility]   10/28/2019 10:15 AM Governor Specking, Carlos Levering, PTA [facility] S[facility]

## 2019-10-21 NOTE — Progress Notes (Signed)
PT Note:  Pt cancelled his physical therapy appt for today.    Brian Wiseman, PT

## 2019-10-21 NOTE — Progress Notes (Signed)
PT Note:  Pt cancelled his physical therapy appt for today.    Zellie Jenning, PT

## 2019-10-22 ENCOUNTER — Inpatient Hospital Stay: Payer: BLUE CROSS/BLUE SHIELD | Primary: Family Medicine

## 2019-10-27 ENCOUNTER — Inpatient Hospital Stay: Admit: 2019-10-27 | Payer: BLUE CROSS/BLUE SHIELD | Primary: Family Medicine

## 2019-10-27 NOTE — Progress Notes (Signed)
Progress Notes by Blanchard Mane, PTA at 10/27/19 1337                Author: Blanchard Mane, PTA  Service: --  Author Type: Physical Therapy Assistant       Filed: 10/27/19 1427  Date of Service: 10/27/19 1337  Status: Signed          Editor: Blanchard Mane, PTA (Physical Therapy Assistant)               Steven [name]   DOB: January 14, 1956   Primary: [employer] Jacks Essentials*   Secondary:   [facility] at [facility]   [address], [place] [address]   Phone:[phone]   Fax:[phone]                 OUTPATIENT PHYSICAL THERAPY: Daily Treatment Note 10/27/2019   Visit Count:  3       ICD-10: Treatment Diagnosis:      Pain in right shoulder (M25.511)     Stiffness of right shoulder, not elsewhere classified (M25.611)   Precautions/Allergies:    Nsaids (non-steroidal anti-inflammatory drug)    TREATMENT PLAN:   Effective Dates: 10/08/2019 TO 01/06/2020 (90 days).  Frequency/Duration : 2 times a week for 90 Day(s)      Pre-treatment Symptoms/Complaints:  10/27/2019 : 'I was sore after last visit"   Pain:  Initial:    1/10  Post Session:  4/10        Medications Last Reviewed:  10/27/2019   Updated Objective Findings:   None Today   TREATMENT:           THERAPEUTIC EXERCISE: (30 minutes):  Exercises per grid below to improve mobility and strength.  Required minimal visual, verbal and manual cues to promote proper body alignment, promote proper  body posture and promote proper body mechanics.  Progressed resistance, range, repetitions and complexity of movement as indicated.     Date:   10/27/2019      Activity/Exercise  Parameters      Pulleys  To tolerance in flexion   20 reps              Wall ladder  5 reps to tolerance      Theraband rows  Red t-band   20 reps      Theraband pulls  Red t-band   20 reps      Theraband internal rotation  Red t-band   15 reps   R UE      Theraband external rotation  Red t-band   20reps   R UE      Flexion  Supine with wand   15  reps      External rotation  Supine with wand   15 reps           MANUAL THERAPY: (10 minutes): Joint mobilization, Soft tissue mobilization and PROM to R UE was utilized and necessary because of the patient's restricted joint motion, loss of articular motion  and restricted motion of soft tissue.         Treatment/Session Summary:       Response to Treatment:  Patient tolerated treatment well with improving overall  mobility noted.  Great progress in PT with Right Shoulder.     Communication/Consultation:   None today     Equipment provided today:   None today     Recommendations/Intent for next treatment session: Next visit will focus on improving  overall mobility and strength in R UE with daily activities.      Total Treatment Billable Duration:  40 minutes    PT Patient Time In/Time Out   Time In: 1345   Time Out: 1430   Blanchard Mane, [place]      Future Appointments      Date  Time  Provider  Department  Center      10/27/2019   1:45 PM  Blanchard Mane, PTA  [facility]  SFE      10/28/2019   7:00 PM  [employer], Roderic Palau, PTA  [facility]  SFE

## 2019-10-27 NOTE — Progress Notes (Signed)
Steven [name]  DOB: 1956/08/27  Primary: [facility]*  Secondary:  [facility]rapy [facility] at [facility]  [address], [address], [place]  [phone]   [phone]        OUTPATIENT PHYSICAL THERAPY: Daily Treatment Note 10/27/2019  Visit Count:  3    ICD-10: Treatment Diagnosis:   ?? Pain in right shoulder (M25.511)  ?? Stiffness of right shoulder, not elsewhere classified (M25.611)  Precautions/Allergies:   Nsaids (non-steroidal anti-inflammatory drug)   TREATMENT PLAN:  Effective Dates: 10/08/2019 TO 01/06/2020 (90 days).  Frequency/Duration: 2 times a week for 90 Day(s)    Pre-treatment Symptoms/Complaints:  10/27/2019: 'I was sore after last visit"  Pain: Initial:   1/10 Post Session:  4/10   Medications Last Reviewed:  10/27/2019  Updated Objective Findings:  None Today  TREATMENT:     THERAPEUTIC EXERCISE: (30 minutes):  Exercises per grid below to improve mobility and strength.  Required minimal visual, verbal and manual cues to promote proper body alignment, promote proper body posture and promote proper body mechanics.  Progressed resistance, range, repetitions and complexity of movement as indicated.   Date:  10/27/2019   Activity/Exercise Parameters   Pulleys To tolerance in flexion  20 reps       Wall ladder 5 reps to tolerance   Theraband rows Red t-band  20 reps   Theraband pulls Red t-band  20 reps   Theraband internal rotation Red t-band  15 reps  R UE   Theraband external rotation Red t-band  20reps  R UE   Flexion Supine with wand  15 reps   External rotation Supine with wand  15 reps     MANUAL THERAPY: (10 minutes): Joint mobilization, Soft tissue mobilization and PROM to R UE was utilized and necessary because of the patient's restricted joint motion, loss of articular motion and restricted motion of soft tissue.       Treatment/Session Summary:     ?? Response to Treatment:  Patient tolerated treatment well with improving overall mobility noted.  Great progress in PT with Right Shoulder.  ?? Communication/Consultation:  None today  ?? Equipment provided today:  None today  ?? Recommendations/Intent for next treatment session: Next visit will focus on improving overall mobility and strength in R UE with daily activities.    Total Treatment Billable Duration:  40 minutes   PT Patient Time In/Time Out  Time In: 9323  Time Out: [place], [place]    Future Appointments   Date Time Provider [place]   10/27/2019  1:45 PM Severiano Gilbert, PTA [facility] SFE   10/28/2019  7:00 PM [place], Carlos Levering, PTA [facility] SFE

## 2019-10-28 ENCOUNTER — Encounter: Payer: BLUE CROSS/BLUE SHIELD | Primary: Family Medicine

## 2019-11-04 ENCOUNTER — Inpatient Hospital Stay: Admit: 2019-11-04 | Payer: BLUE CROSS/BLUE SHIELD | Primary: Family Medicine

## 2019-11-04 DIAGNOSIS — M25511 Pain in right shoulder: Secondary | ICD-10-CM

## 2019-11-04 NOTE — Progress Notes (Signed)
Progress Notes by Allene Dillon, PT at 11/04/19 0454                Author: Allene Dillon, PT  Service: Physical Therapy  Author Type: Physical Therapist       Filed: 11/04/19 1432  Date of Service: 11/04/19 0755  Status: Signed          Editor: Allene Dillon, PT (Physical Therapist)               Steven [name]   DOB: Feb 14, 1956   Primary: [facility]*   Secondary:   [facility] at [facility]   [address], [address], [place] [address]   Phone:[phone]   Fax:[phone]                 OUTPATIENT PHYSICAL THERAPY: Daily Treatment Note 11/04/2019   Visit Count:  4       ICD-10: Treatment Diagnosis:      Pain in right shoulder (M25.511)     Stiffness of right shoulder, not elsewhere classified (M25.611)   Precautions/Allergies:    Nsaids (non-steroidal anti-inflammatory drug)    TREATMENT PLAN:   Effective Dates: 10/08/2019 TO 01/06/2020 (90 days).  Frequency/Duration : 2 times a week for 90 Day(s)      Pre-treatment Symptoms/Complaints:  11/04/2019 : Patient reports he is feeling better and stronger all the time.   Pain:  Initial: Pain Intensity 1: 2   Pain Location 1: Shoulder   Pain Orientation 1: Right   Pain Intervention(s) 1: Rest, Medication (see MAR)   Post Session:  2/10        Medications Last Reviewed:  11/04/2019   Updated Objective Findings:   None Today   TREATMENT:           THERAPEUTIC EXERCISE: (30 minutes):  Exercises per grid below to improve mobility and strength.  Required minimal visual, verbal and manual cues to promote proper body alignment, promote proper  body posture and promote proper body mechanics.  Progressed resistance, range, repetitions and complexity of movement as indicated.     Date:   11/04/2019      Activity/Exercise  Parameters      Pulleys  To tolerance in flexion   20 reps      UBE  6 minutes   Level 1      Wall ladder  5 reps to tolerance      Theraband rows  Green t-band   20 reps      Theraband pulls  Green  t-band   20 reps      Theraband internal rotation  Green t-band   15 reps   R UE      Theraband external rotation  Green t-band   20reps   R UE      Flexion  Supine with wand   15 reps      External rotation  Supine with wand   15 reps           MANUAL THERAPY: (10 minutes): Joint mobilization, Soft tissue mobilization and PROM to R UE was utilized and necessary because of the patient's restricted joint motion, loss of articular motion  and restricted motion of soft tissue.         Treatment/Session Summary:       Response to Treatment:  Patient tolerated treatment well with improving overall  mobility.       Communication/Consultation:   None today  Equipment provided today:   None today     Recommendations/Intent for next treatment session: Next visit will focus on improving overall mobility and strength in R UE with daily activities.      Total Treatment Billable Duration:  40 minutes    PT Patient Time In/Time Out   Time In: 1345   Time Out: 1430   Allene Dillon, PT      Future Appointments      Date  Time  Provider  Department  Center      11/04/2019   1:45 PM  Allene Dillon, PT  [facility]  SFE      11/11/2019   1:45 PM  Allene Dillon, PT  [facility]  SFE      11/18/2019   1:45 PM  Allene Dillon, PT  [facility]  SFE      11/25/2019   2:30 PM  Sams, Gregery Na, PT  [facility]  SFE

## 2019-11-04 NOTE — Progress Notes (Signed)
Steven [name]  DOB: 1956-04-27  Primary: [facility]*  Secondary:  Therapy Center at [facility]  [address], [place] [address]  [phone]   Fax:[phone]        OUTPATIENT PHYSICAL THERAPY: Daily Treatment Note 11/04/2019  Visit Count:  4    ICD-10: Treatment Diagnosis:   ?? Pain in right shoulder (M25.511)  ?? Stiffness of right shoulder, not elsewhere classified (M25.611)  Precautions/Allergies:   Nsaids (non-steroidal anti-inflammatory drug)   TREATMENT PLAN:  Effective Dates: 10/08/2019 TO 01/06/2020 (90 days).  Frequency/Duration: 2 times a week for 90 Day(s)    Pre-treatment Symptoms/Complaints:  11/04/2019: Patient reports he is feeling better and stronger all the time.  Pain: Initial: Pain Intensity 1: 2  Pain Location 1: Shoulder  Pain Orientation 1: Right  Pain Intervention(s) 1: Rest, Medication (see MAR)  Post Session:  2/10   Medications Last Reviewed:  11/04/2019  Updated Objective Findings:  None Today  TREATMENT:     THERAPEUTIC EXERCISE: (30 minutes):  Exercises per grid below to improve mobility and strength.  Required minimal visual, verbal and manual cues to promote proper body alignment, promote proper body posture and promote proper body mechanics.  Progressed resistance, range, repetitions and complexity of movement as indicated.   Date:  11/04/2019   Activity/Exercise Parameters   Pulleys To tolerance in flexion  20 reps   UBE 6 minutes  Level 1   Wall ladder 5 reps to tolerance   Theraband rows Green t-band  20 reps   Theraband pulls Green t-band  20 reps   Theraband internal rotation Green t-band  15 reps  R UE   Theraband external rotation Green t-band  20reps  R UE   Flexion Supine with wand  15 reps   External rotation Supine with wand  15 reps      MANUAL THERAPY: (10 minutes): Joint mobilization, Soft tissue mobilization and PROM to R UE was utilized and necessary because of the patient's restricted joint motion, loss of articular motion and restricted motion of soft tissue.       Treatment/Session Summary:    ?? Response to Treatment:  Patient tolerated treatment well with improving overall mobility.    ?? Communication/Consultation:  None today  ?? Equipment provided today:  None today  ?? Recommendations/Intent for next treatment session: Next visit will focus on improving overall mobility and strength in R UE with daily activities.    Total Treatment Billable Duration:  40 minutes   PT Patient Time In/Time Out  Time In: 1345  Time Out: 1430  Allene Dillon, PT    Future Appointments   Date Time Provider Department Center   11/04/2019  1:45 PM Allene Dillon, PT [facility] SFE   11/11/2019  1:45 PM Allene Dillon, PT [facility] SFE   11/18/2019  1:45 PM Allene Dillon, PT [facility] SFE   11/25/2019  2:30 PM Favian Kittleson, Gregery Na, PT [facility] SFE

## 2019-11-11 ENCOUNTER — Institutional Professional Consult (permissible substitution): Payer: No Typology Code available for payment source | Admitting: Emergency Medicine

## 2019-11-11 ENCOUNTER — Inpatient Hospital Stay: Admit: 2019-11-11 | Payer: BLUE CROSS/BLUE SHIELD | Primary: Family Medicine

## 2019-11-11 NOTE — Progress Notes (Signed)
 Progress Notes by [name], PT at 11/11/19 1345                Author: [name], PT  Service: Physical Therapy  Author Type: Physical Therapist       Filed: 11/11/19 1540  Date of Service: 11/11/19 1345  Status: Signed          Editor: [name], PT (Physical Therapist)                  Steven [name] Rea   DOB: 21-Feb-1956   Primary: [name]*   Secondary:   Therapy Center at [facility]   [address], [place] [address]   Phone:[phone]   [phone]                      OUTPATIENT PHYSICAL THERAPY:Progress Report 11/11/2019         ICD-10: Treatment Diagnosis:      Pain in right shoulder (M25.511)     Stiffness of right shoulder, not elsewhere classified (M25.611)   Precautions/Allergies:    Nsaids (non-steroidal anti-inflammatory drug)    TREATMENT PLAN:   Effective Dates: 10/08/2019 TO 01/06/2020 (90 days).  Frequency/Duration : 2 times a week for 90 Day(s)  MEDICAL/REFERRING DIAGNOSIS:   Impingement syndrome of right shoulder [M75.41]   Pain in right shoulder [M25.511]    DATE OF ONSET: Surgery: September 30, 2019   REFERRING PHYSICIAN: Jori Elsie HERO, MD   MD Orders: Evaluate and Treat   Return MD Appointment: January 2021          ASSESSMENT:  Steven [name]  presents with decreased mobility, decreased strength, and pain in right shoulder secondary to recent RCR of R UE.  Patient has attended a total of 5  scheduled physical therapy visits including initial evaluation on 10/08/2019.  Treatment has consisted of strengthening and mobility activities to improve overall functional mobility and performance with activities of daily living.              PROBLEM LIST (Impacting functional limitations):   1.  Decreased Strength   2.  Decreased ADL/Functional Activities   3.  Increased Pain   4.  Decreased Activity Tolerance  INTERVENTIONS PLANNED: (Treatment may consist of any combination of the following)   1.  Cold   2.  Heat   3.   Home Exercise Program (HEP)   4.  Manual Therapy   5.  Neuromuscular Re-education/Strengthening   6.  Range of Motion (ROM)   7.  Therapeutic Activites   8.  Therapeutic Exercise/Strengthening          GOALS: (Goals have been discussed and agreed upon with patient.)   Short-Term Functional Goals: Time Frame: 30 days   1.  Patient will be independent with home exercise program without exacerbation of symptoms or cueing needed--goal met.    2.  Patient will be independent with correct sleeping positions and awareness/avoidance of aggravating positions without cueing needed--goal met.    Discharge Goals: Time Frame: 90 days   1.  Patient will be independent with all ADLs with minimal onset of right  shoulder pain and no deficits with daily tasks--goal ongoing.   2.  Patient will report no fear avoidance with social or recreational activities due to right shoulder pain --goal ongoing.   3.  Patient will score less than or equal to 12/55 on Quick DASH with minimal  effect of  right shoulder pain on patient's ability to manage every day life activities--goal ongoing.      OUTCOME MEASURE:    Tool Used: Disabilities of the Arm, Shoulder and Hand (DASH)  Questionnaire - Quick Version   Score:   Initial: 21/55    Most Recent: 14/55 (Date: 11/11/2019 )        Interpretation of Score: The DASH is designed to measure the activities  of daily living in person's with upper extremity dysfunction or pain.  Each section is scored on a 1-5 scale, 5 representing the greatest disability.  The scores of each section are added together for a total score of 55.        MEDICAL NECESSITY:      Patient is expected to demonstrate progress in strength and range of motion to improve safety during daily activities.     Patient demonstrates good rehab potential due to higher previous functional level.     Skilled intervention continues to be required due to decreased mobility.   REASON FOR SERVICES/OTHER COMMENTS:     Patient continues to  demonstrate capacity to improve overall functional mobility which will increase independence and increase  safety.   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  He reports he was walking the trail with his dog, when his dog lunged and he felt it in his right shoulder.  He reports that he had surgery a week ago.  He reports he feels he has been doing well.  He reports that he hasn't really taken any pain  medication.  He reports that he wears the sling at night and when he goes out, but otherwise just takes it easy.  He reports that he feels he is doing well overall and is glad the surgery went well.      11/11/2019 (Progress Note): Patient reports he is doing well and feeling better all the time.  He reports he still has some soreness at times, but feels his arm is getting stronger and more secure.   Past Medical History/Comorbidities:    Steven [name]   has a past medical history of Allergic rhinitis, cause unspecified, BMI 40.0-44.9, adult [facility]), Chronic left sacroiliac joint pain, Chronic radicular low back pain, Diverticulitis, Essential hypertension, benign, Generalized anxiety disorder, GERD (gastroesophageal  reflux disease), Hypercholesterolemia, Left lumbar radiculitis, Lumbar stenosis with neurogenic claudication (11/06/2015), Mood disorder (HCC) (07/14/2014), Morbid obesity (HCC), OSA on CPAP, Polycythemia (dx--06/2015), Prediabetes, Spondylolisthesis at L4-L5  level (10/19/2015), Testicular hypofunction,  Thrombophlebitis of left leg (HCC) (04/2015), Venous insufficiency of both lower extremities (07/14/2014), and Vitamin D deficiency.   Steven [name]  has a past surgical history  that includes endoscopy, colon, diagnostic (2011); hx colonoscopy (05/2015); hx knee arthroscopy (Bilateral, 04/2015); and hx orthopaedic (Left, 10/19/2015).   Social History/Living Environment:  Home Environment: Private residence   Living Alone: No   Support Systems: Family member(s), Friends \ neighbors   Prior Level of Function/Work/Activity:   Independent   Dominant Side:          RIGHT   Personal Factors:           Sex:  male         Age:  64 y.o.     Current Medications:              Current Outpatient Medications:    ?  meclizine (ANTIVERT) 25 mg tablet, Take 25 mg by mouth three (3) times daily as needed for Dizziness., Disp: , Rfl:    ?  atorvastatin (LIPITOR) 40 mg tablet, Take  by mouth nightly., Disp: , Rfl:    ?  melatonin 3 mg tablet, Take  by mouth nightly., Disp: , Rfl:    ?  olmesartan-hydroCHLOROthiazide (BENICAR HCT) 40-12.5 mg per tablet, Take 1 Tab by mouth daily., Disp: 90 Tab, Rfl: 1   ?  DULoxetine (CYMBALTA) 30 mg capsule, Take 1 Cap by mouth daily., Disp: 90 Cap, Rfl: 1   ?  metFORMIN (GLUCOPHAGE) 1,000 mg tablet, TAKE ONE TABLET BY MOUTH TWICE A DAY, Disp: 180 Tab, Rfl: 1   ?  OMEPRAZOLE PO, Take 20 mg by mouth nightly., Disp: , Rfl:    ?  aspirin (ASPIRIN LOW-STRENGTH) 81 mg chewable tablet, Take 81 mg by mouth every morning. Indications: continue, Disp: , Rfl:    ?  MULTI-VITAMIN HI-PO PO, Take 2 Tabs by mouth every morning. Indications: hold until after surgery, Disp: , Rfl:        Date Last Reviewed:  11/11/2019         EXAMINATION:       Observation/Orthostatic Postural Assessment:           Posture Assessment: Rounded shoulders, Forward head          Bruising noted along anterior upper right arm.  Steri-strips noted along incision sites.   Palpation:           Mild tenderness to palpation noted along area of  bruising post surgery.     ROM:           R UE Assessment (PROM):     Shoulder Flexion: 110 degrees     Shoulder Extension: 20 degrees     Shoulder Abduction: 70 degrees     Shoulder Adduction: 0 degrees     Shoulder Internal rotation: 70 degrees with UE at 0 degrees abduction     Shoulder External rotation: 50 degrees with UE at 0 degrees abduction     Elbow Flexion: 110 degrees     Elbow Extension: 0 degrees   Strength:           R UE Assessment (Strength):     Shoulder Flexion: 3/5 with manual muscle testing     Shoulder Extension: 3/5 with manual muscle testing     Shoulder Abduction: 3/5 with manual muscle testing     Shoulder Adduction: 3/5 with manual muscle testing     Shoulder Internal rotation: 3/5 with manual muscle testing     Shoulder External rotation: 3/5 with manual muscle testing     Elbow Flexion: 3+/5 with manual muscle testing     Elbow Extension: 3+/5 with manual muscle testing      Special Tests:           Negative   Neurological Screen:  Dermatomes:  Within normal limits         Reflexes:  2+   Functional Mobility:    Gait/Mobility:          Independent              Transfers:         Sit to Stand: Independent     Stand to Sit: Independent     Stand Pivot Transfers: Independent     Bed to Chair: Independent     Lateral Transfers: Independent              Bed Mobility:         Rolling: Independent     Supine to Sit: Independent     Sit to Supine: Independent     Scooting: Independent

## 2019-11-11 NOTE — Progress Notes (Signed)
 Progress Notes by [name], PT at 11/11/19 [phone]                Author: [name], PT  Service: Physical Therapy  Author Type: Physical Therapist       Filed: 11/11/19 1541  Date of Service: 11/11/19 0834  Status: Signed          Editor: [name], PT (Physical Therapist)               Ozell LITTIE Rea   DOB: 08-Sep-1956   Primary: [name]*   Secondary:   [facility]rapy [facility] at [facility]   [address], [place] [address]   Phone:[phone]   [phone]                 OUTPATIENT PHYSICAL THERAPY: Daily Treatment Note 11/11/2019   Visit Count:  5       ICD-10: Treatment Diagnosis:      Pain in right shoulder (M25.511)     Stiffness of right shoulder, not elsewhere classified (M25.611)   Precautions/Allergies:    Nsaids (non-steroidal anti-inflammatory drug)    TREATMENT PLAN:   Effective Dates: 10/08/2019 TO 01/06/2020 (90 days).  Frequency/Duration : 2 times a week for 90 Day(s)      Pre-treatment Symptoms/Complaints:  11/11/2019 : Patient reports he is doing really well and feeling good.   Pain:  Initial: Pain Intensity 1: 2   Pain Location 1: Shoulder   Pain Orientation 1: Right   Pain Intervention(s) 1: Rest, Medication (see MAR)   Post Session:  2/10        Medications Last Reviewed:  11/11/2019   Updated Objective Findings:   None Today   TREATMENT:           THERAPEUTIC EXERCISE: (30 minutes):  Exercises per grid below to improve mobility and strength.  Required minimal visual, verbal and manual cues to promote proper body alignment, promote proper  body posture and promote proper body mechanics.  Progressed resistance, range, repetitions and complexity of movement as indicated.     Date:   11/11/2019      Activity/Exercise  Parameters      Pulleys  To tolerance in flexion   20 reps      UBE  6 minutes   Level 1      Wall ladder  5 reps to tolerance      Theraband rows  Green t-band   20 reps      Theraband pulls  Green t-band    20 reps      Theraband internal rotation  Green t-band   15 reps   R UE      Theraband external rotation  Green t-band   20reps   R UE      Bent over rows  8 pounds   R UE   15 reps      Bicep curls  8 pounds   R UE   15 pounds      Flexion  Supine with wand   15 reps      External rotation  Supine with wand   15 reps           MANUAL THERAPY: (10 minutes): Joint mobilization, Soft tissue mobilization and PROM to R UE was utilized and necessary because of the patient's restricted joint motion, loss of articular motion  and restricted motion of soft tissue.  Treatment/Session Summary:       Response to Treatment:  Patient tolerated treatment well with improving overall  mobility.       Communication/Consultation:   None today     Equipment provided today:   None today     Recommendations/Intent for next treatment session: Next visit will focus on improving overall mobility and strength in R UE with daily activities.      Total Treatment Billable Duration:  40 minutes    PT Patient Time In/Time Out   Time In: 1345   Time Out: 1430   [name], PT      Future Appointments      Date  Time  Provider  Department  Center      11/11/2019   1:45 PM  [name], PT  [facility]  SFE      11/18/2019   1:45 PM  [name], PT  [facility]  SFE      11/25/2019   2:30 PM  Sams, Delon LOISE, PT  [facility]  SFE

## 2019-11-11 NOTE — Progress Notes (Signed)
Steven [name]  DOB: May 23, 1956  Primary: [facility]*  Secondary:  [facility]  [address], [place] [address]  Phone:[phone]   Fax:[phone]        OUTPATIENT PHYSICAL THERAPY: Daily Treatment Note 11/11/2019  Visit Count:  5    ICD-10: Treatment Diagnosis:   ?? Pain in right shoulder (M25.511)  ?? Stiffness of right shoulder, not elsewhere classified (M25.611)  Precautions/Allergies:   Nsaids (non-steroidal anti-inflammatory drug)   TREATMENT PLAN:  Effective Dates: 10/08/2019 TO 01/06/2020 (90 days).  Frequency/Duration: 2 times a week for 90 Day(s)    Pre-treatment Symptoms/Complaints:  11/11/2019: Patient reports he is doing really well and feeling good.  Pain: Initial: Pain Intensity 1: 2  Pain Location 1: Shoulder  Pain Orientation 1: Right  Pain Intervention(s) 1: Rest, Medication (see MAR)  Post Session:  2/10   Medications Last Reviewed:  11/11/2019  Updated Objective Findings:  None Today  TREATMENT:     THERAPEUTIC EXERCISE: (30 minutes):  Exercises per grid below to improve mobility and strength.  Required minimal visual, verbal and manual cues to promote proper body alignment, promote proper body posture and promote proper body mechanics.  Progressed resistance, range, repetitions and complexity of movement as indicated.   Date:  11/11/2019   Activity/Exercise Parameters   Pulleys To tolerance in flexion  20 reps   UBE 6 minutes  Level 1   Wall ladder 5 reps to tolerance   Theraband rows Green t-band  20 reps   Theraband pulls Green t-band  20 reps   Theraband internal rotation Green t-band  15 reps  R UE   Theraband external rotation Green t-band  20reps  R UE   Bent over rows 8 pounds  R UE  15 reps   Bicep curls 8 pounds  R UE  15 pounds   Flexion Supine with wand  15 reps   External rotation Supine with wand  15 reps      MANUAL THERAPY: (10 minutes): Joint mobilization, Soft tissue mobilization and PROM to R UE was utilized and necessary because of the patient's restricted joint motion, loss of articular motion and restricted motion of soft tissue.       Treatment/Session Summary:    ?? Response to Treatment:  Patient tolerated treatment well with improving overall mobility.    ?? Communication/Consultation:  None today  ?? Equipment provided today:  None today  ?? Recommendations/Intent for next treatment session: Next visit will focus on improving overall mobility and strength in R UE with daily activities.    Total Treatment Billable Duration:  40 minutes   PT Patient Time In/Time Out  Time In: 1345  Time Out: 1430  Allene Dillon, PT    Future Appointments   Date Time Provider Department Center   11/11/2019  1:45 PM Allene Dillon, PT [facility]   11/18/2019  1:45 PM Allene Dillon, PT [facility]   11/25/2019  2:30 PM Auden Tatar, Gregery Na, PT [facility]

## 2019-11-11 NOTE — Progress Notes (Signed)
Steven [name]  DOB: 1956/03/25  Primary: [facility]*  Secondary:  [facility]  [address], [place]  Phone:[phone]   [phone]          OUTPATIENT PHYSICAL THERAPY:Progress Report 11/11/2019   ICD-10: Treatment Diagnosis:   ?? Pain in right shoulder (M25.511)  ?? Stiffness of right shoulder, not elsewhere classified (M25.611)  Precautions/Allergies:   Nsaids (non-steroidal anti-inflammatory drug)   TREATMENT PLAN:  Effective Dates: 10/08/2019 TO 01/06/2020 (90 days).  Frequency/Duration: 2 times a week for 90 Day(s) MEDICAL/REFERRING DIAGNOSIS:  Impingement syndrome of right shoulder [M75.41]  Pain in right shoulder [M25.511]   DATE OF ONSET: Surgery: September 30, 2019  REFERRING PHYSICIAN: Bonita Quin, MD  MD Orders: Evaluate and Treat  Return MD Appointment: January 2021     ASSESSMENT:  Steven [name] presents with decreased mobility, decreased strength, and pain in right shoulder secondary to recent RCR of R UE.  Patient has attended a total of 5 scheduled physical therapy visits including initial evaluation on 10/08/2019.  Treatment has consisted of strengthening and mobility activities to improve overall functional mobility and performance with activities of daily living.        PROBLEM LIST (Impacting functional limitations):  1. Decreased Strength  2. Decreased ADL/Functional Activities  3. Increased Pain  4. Decreased Activity Tolerance INTERVENTIONS PLANNED: (Treatment may consist of any combination of the following)  1. Cold  2. Heat  3. Home Exercise Program (HEP)  4. Manual Therapy  5. Neuromuscular Re-education/Strengthening  6. Range of Motion (ROM)  7. Therapeutic Activites  8. Therapeutic Exercise/Strengthening     GOALS: (Goals have been discussed and agreed upon with patient.)  Short-Term Functional Goals: Time Frame: 30 days   1. Patient will be independent with home exercise program without exacerbation of symptoms or cueing needed--goal met.   2. Patient will be independent with correct sleeping positions and awareness/avoidance of aggravating positions without cueing needed--goal met.   Discharge Goals: Time Frame: 90 days  1. Patient will be independent with all ADLs with minimal onset of right shoulder pain and no deficits with daily tasks--goal ongoing.  2. Patient will report no fear avoidance with social or recreational activities due to right shoulder pain --goal ongoing.  3. Patient will score less than or equal to 12/55 on Quick DASH with minimal effect of right shoulder pain on patient's ability to manage every day life activities--goal ongoing.    OUTCOME MEASURE:   Tool Used: Disabilities of the Arm, Shoulder and Hand (DASH) Questionnaire - Quick Version  Score:  Initial: 21/55  Most Recent: 14/55 (Date: 11/11/2019 )   Interpretation of Score: The DASH is designed to measure the activities of daily living in person's with upper extremity dysfunction or pain.  Each section is scored on a 1-5 scale, 5 representing the greatest disability.  The scores of each section are added together for a total score of 55.      MEDICAL NECESSITY:   ?? Patient is expected to demonstrate progress in strength and range of motion to improve safety during daily activities.  ?? Patient demonstrates good rehab potential due to higher previous functional level.  ?? Skilled intervention continues to be required due to decreased mobility.  REASON FOR SERVICES/OTHER COMMENTS:  ?? Patient continues to demonstrate capacity to improve overall functional mobility which will increase independence and increase safety.  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  He reports he was walking the trail with his dog, when his dog lunged and he felt it in his right shoulder.  He reports that he had surgery a week ago.  He reports he feels he has been doing well.  He reports that he hasn't really taken any pain medication.  He reports that he wears the sling at night and when he goes out, but otherwise just takes it easy.  He reports that he feels he is doing well overall and is glad the surgery went well.    11/11/2019 (Progress Note): Patient reports he is doing well and feeling better all the time.  He reports he still has some soreness at times, but feels his arm is getting stronger and more secure.  Past Medical History/Comorbidities:   Steven [name]  has a past medical history of Allergic rhinitis, cause unspecified, BMI 40.0-44.9, adult [facility]), Chronic left sacroiliac joint pain, Chronic radicular low back pain, Diverticulitis, Essential hypertension, benign, Generalized anxiety disorder, GERD (gastroesophageal reflux disease), Hypercholesterolemia, Left lumbar radiculitis, Lumbar stenosis with neurogenic claudication (11/06/2015), Mood disorder ([place]) (07/14/2014), Morbid obesity ([place]), OSA on CPAP, Polycythemia (dx--06/2015), Prediabetes, Spondylolisthesis at L4-L5 level (10/19/2015), Testicular hypofunction, Thrombophlebitis of left leg ([place]) (04/2015), Venous insufficiency of both lower extremities (07/14/2014), and Vitamin D deficiency.  Steven [name]  has a past surgical history that includes endoscopy, colon, diagnostic (2011); hx colonoscopy (05/2015); hx knee arthroscopy (Bilateral, 04/2015); and hx orthopaedic (Left, 10/19/2015).  Social History/Living Environment:    Home Environment: Private residence  Living Alone: No  Support Systems: Family member(s), Friends \\ neighbors  Prior Level of Function/Work/Activity:  Independent  Dominant Side:         RIGHT  Personal Factors:          Sex:  male        Age:  64 y.o.   Current Medications:       Current Outpatient Medications:   ???  meclizine (ANTIVERT) 25 mg tablet, Take 25 mg by mouth three (3) times daily as needed for Dizziness., Disp: , Rfl:   ???  atorvastatin (LIPITOR) 40 mg tablet, Take  by mouth nightly., Disp: , Rfl:   ???  melatonin 3 mg tablet, Take  by mouth nightly., Disp: , Rfl:   ???  olmesartan-hydroCHLOROthiazide (BENICAR HCT) 40-12.5 mg per tablet, Take 1 Tab by mouth daily., Disp: 90 Tab, Rfl: 1  ???  DULoxetine (CYMBALTA) 30 mg capsule, Take 1 Cap by mouth daily., Disp: 90 Cap, Rfl: 1  ???  metFORMIN (GLUCOPHAGE) 1,000 mg tablet, TAKE ONE TABLET BY MOUTH TWICE A DAY, Disp: 180 Tab, Rfl: 1  ???  OMEPRAZOLE PO, Take 20 mg by mouth nightly., Disp: , Rfl:   ???  aspirin (ASPIRIN LOW-STRENGTH) 81 mg chewable tablet, Take 81 mg by mouth every morning. Indications: continue, Disp: , Rfl:   ???  MULTI-VITAMIN HI-PO PO, Take 2 Tabs by mouth every morning. Indications: hold until after surgery, Disp: , Rfl:  Date Last Reviewed:  11/11/2019    EXAMINATION:   Observation/Orthostatic Postural Assessment:          Posture Assessment: Rounded shoulders, Forward head         Bruising noted along anterior upper right arm.  Steri-strips noted along incision sites.  Palpation:          Mild tenderness to palpation noted along area of bruising post surgery.    ROM:          R UE Assessment (PROM):  ?? Shoulder Flexion: 110 degrees  ?? Shoulder Extension: 20 degrees  ?? Shoulder Abduction: 70 degrees  ?? Shoulder Adduction: 0 degrees  ?? Shoulder Internal rotation: 70 degrees with UE at 0 degrees abduction  ?? Shoulder External rotation: 50 degrees with UE at 0 degrees abduction  ?? Elbow Flexion: 110 degrees  ?? Elbow Extension: 0 degrees  Strength:           R UE Assessment (Strength):  ?? Shoulder Flexion: 3/5 with manual muscle testing  ?? Shoulder Extension: 3/5 with manual muscle testing  ?? Shoulder Abduction: 3/5 with manual muscle testing  ?? Shoulder Adduction: 3/5 with manual muscle testing  ?? Shoulder Internal rotation: 3/5 with manual muscle testing  ?? Shoulder External rotation: 3/5 with manual muscle testing  ?? Elbow Flexion: 3+/5 with manual muscle testing  ?? Elbow Extension: 3+/5 with manual muscle testing    Special Tests:          Negative  Neurological Screen:        Dermatomes:  Within normal limits        Reflexes:  2+  Functional Mobility:   Gait/Mobility:    ??   Independent         Transfers:     ?? Sit to Stand: Independent  ?? Stand to Sit: Independent  ?? Stand Pivot Transfers: Independent  ?? Bed to Chair: Independent  ?? Lateral Transfers: Independent         Bed Mobility:     ?? Rolling: Independent  ?? Supine to Sit: Independent  ?? Sit to Supine: Independent  ?? Scooting: Independent

## 2019-11-12 ENCOUNTER — Ambulatory Visit: Payer: No Typology Code available for payment source | Admitting: Emergency Medicine

## 2019-11-12 ENCOUNTER — Ambulatory Visit (INDEPENDENT_AMBULATORY_CARE_PROVIDER_SITE_OTHER): Payer: No Typology Code available for payment source | Admitting: Nurse Practitioner

## 2019-11-12 ENCOUNTER — Other Ambulatory Visit: Payer: Self-pay

## 2019-11-12 ENCOUNTER — Encounter: Payer: Self-pay | Admitting: Nurse Practitioner

## 2019-11-12 ENCOUNTER — Encounter: Payer: Self-pay | Admitting: Emergency Medicine

## 2019-11-12 VITALS — BP 128/60 | HR 93 | Temp 97.9°F | Ht 71.0 in | Wt 320.2 lb

## 2019-11-12 DIAGNOSIS — E66813 Obesity, class 3: Secondary | ICD-10-CM | POA: Insufficient documentation

## 2019-11-12 DIAGNOSIS — J4520 Mild intermittent asthma, uncomplicated: Secondary | ICD-10-CM | POA: Diagnosis not present

## 2019-11-12 DIAGNOSIS — G4733 Obstructive sleep apnea (adult) (pediatric): Secondary | ICD-10-CM | POA: Diagnosis not present

## 2019-11-12 DIAGNOSIS — J3081 Allergic rhinitis due to animal (cat) (dog) hair and dander: Secondary | ICD-10-CM | POA: Diagnosis not present

## 2019-11-12 DIAGNOSIS — Q893 Situs inversus: Secondary | ICD-10-CM

## 2019-11-12 DIAGNOSIS — K219 Gastro-esophageal reflux disease without esophagitis: Secondary | ICD-10-CM | POA: Diagnosis not present

## 2019-11-12 DIAGNOSIS — J45909 Unspecified asthma, uncomplicated: Secondary | ICD-10-CM | POA: Insufficient documentation

## 2019-11-12 DIAGNOSIS — M1990 Unspecified osteoarthritis, unspecified site: Secondary | ICD-10-CM | POA: Insufficient documentation

## 2019-11-12 DIAGNOSIS — G8929 Other chronic pain: Secondary | ICD-10-CM

## 2019-11-12 DIAGNOSIS — J309 Allergic rhinitis, unspecified: Secondary | ICD-10-CM | POA: Insufficient documentation

## 2019-11-12 DIAGNOSIS — M545 Low back pain, unspecified: Secondary | ICD-10-CM | POA: Insufficient documentation

## 2019-11-12 DIAGNOSIS — I10 Essential (primary) hypertension: Secondary | ICD-10-CM | POA: Diagnosis not present

## 2019-11-12 DIAGNOSIS — M5442 Lumbago with sciatica, left side: Secondary | ICD-10-CM

## 2019-11-12 NOTE — Patient Instructions (Signed)
We will work on Training and development officer at our [place] office.  Enrollment forms and lab work will be done today Please continue your Singulair, fluticasone nasal spray as you have been taking them. Keep albuterol available to use 2 puffs if you need it for shortness of breath, chest tightness, wheezing. Continue your pantoprazole and Pepcid as you have been taking them. We will arrange for pulmonary function testing to compare with your priors from [place] at your next office visit. Follow with Dr. Lamonte Sakai next available with full pulmonary function testing on the same day.

## 2019-11-12 NOTE — Progress Notes (Signed)
Subjective:    Patient ID: Jose Liu, male    DOB: 1955/11/13, 64 y.o.   MRN: [id number]  HPI 64 year old gentleman, never smoker, recently moved from [place] to [place].  He has a history of severe allergies with reported elevated IgE, previously on Xolair with good control. Reports that he has also been given dx of asthma.  He has been on targeted immunotherapy in the past without much response - multiple grasses, trees, mites, ragweed, cats, soy, honey.  Also with a history of hypertension on an ACE inhibitor, GERD. He has been off the Xolair for the last 3 months since the move, increased cough, clear mucous. Never has required steroids.   Maintenance meds include Singulair, fluticasone nasal spray.  Also on pantoprazole and Pepcid for his reflux.  He has albuterol which he uses approximately once a month. He was on Advair in the past, stopped many years ago.  He refuses the flu shot, feels it makes him sick  He believes he was receiving 300mg  every 4 weeks   Review of Systems  Constitutional: Negative for fever and unexpected weight change.  HENT: Negative for congestion, dental problem, ear pain, nosebleeds, postnasal drip, rhinorrhea, sinus pressure, sneezing, sore throat and trouble swallowing.   Eyes: Negative for redness and itching.  Respiratory: Negative for cough, chest tightness, shortness of breath and wheezing.   Cardiovascular: Negative for palpitations and leg swelling.  Gastrointestinal: Negative for nausea and vomiting.  Genitourinary: Negative for dysuria.  Musculoskeletal: Negative for joint swelling.  Skin: Negative for rash.  Allergic/Immunologic: Positive for environmental allergies and food allergies. Negative for immunocompromised state.  Neurological: Negative for headaches.  Hematological: Does not bruise/bleed easily.  Psychiatric/Behavioral: Negative for dysphoric mood. The patient is not nervous/anxious.     Past Medical History:  Diagnosis  Date  . Allergies   . Asthma   . GERD (gastroesophageal reflux disease)   . Hypertension   . [place] inversus      Family History  Problem Relation Age of Onset  . CAD Mother        quadruple CABG  . Stroke Mother   . Hypertension Mother   . Dementia Father   . Prostate cancer Father      Social History   Socioeconomic History  . Marital status: Married    Spouse name: Not on file  . Number of children: Not on file  . Years of education: Not on file  . Highest education level: Not on file  Occupational History  . Not on file  Tobacco Use  . Smoking status: Never Smoker  . Smokeless tobacco: Never Used  Substance and Sexual Activity  . Alcohol use: Yes    Comment: on rare occasion  . Drug use: Never  . Sexual activity: Not on file  Other Topics Concern  . Not on file  Social History Narrative  . Not on file   Social Determinants of Health   Financial Resource Strain: Low Risk   . Difficulty of Paying Living Expenses: Not hard at all  Food Insecurity: No Food Insecurity  . Worried About Charity fundraiser in the Last Year: Never true  . Ran Out of Food in the Last Year: Never true  Transportation Needs: No Transportation Needs  . Lack of Transportation (Medical): No  . Lack of Transportation (Non-Medical): No  Physical Activity: Inactive  . Days of Exercise per Week: 0 days  . Minutes of Exercise per Session: 0 min  Stress: No Stress Concern Present  . Feeling of Stress : Not at all  Social Connections: Slightly Isolated  . Frequency of Communication with Friends and Family: Three times a week  . Frequency of Social Gatherings with Friends and Family: Three times a week  . Attends Religious Services: Never  . Active Member of Clubs or Organizations: Yes  . Attends Archivist Meetings: More than 4 times per year  . Marital Status: Married  Human resources officer Violence:   . Fear of Current or Ex-Partner: Not on file  . Emotionally Abused: Not on file   . Physically Abused: Not on file  . 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 Dust exposure Was a fuel truck driver, worked on a fruit farm remotely No [employer]  Allergies  Allergen Reactions  . Honey Bee Treatment [Bee Venom]     emesis  . Soy Allergy     cramps     Outpatient Medications Prior to Visit  Medication Sig Dispense Refill  . albuterol (VENTOLIN HFA) 108 (90 Base) MCG/ACT inhaler Inhale 1 puff into the lungs every 6 (six) hours as needed.    . famotidine (PEPCID) 40 MG tablet Take 40 mg by mouth daily.    . fluticasone (FLONASE) 50 MCG/ACT nasal spray Place 2 sprays into both nostrils daily.    Marland Kitchen lisinopril-hydrochlorothiazide (ZESTORETIC) 10-12.5 MG tablet Take 1 tablet by mouth daily.    . montelukast (SINGULAIR) 10 MG tablet Take 10 mg by mouth daily.    . pantoprazole (PROTONIX) 40 MG tablet Take 40 mg by mouth daily.    Marland Kitchen UNABLE TO FIND Xolair injection monthly for allergies     No facility-administered medications prior to visit.        Objective:   Physical Exam Today's Vitals   11/12/19 1526  BP: 128/60  Pulse: 93  Temp: 97.9 F (36.6 C)  TempSrc: Temporal  SpO2: 98%  Weight: (!) 320 lb 3.2 oz (145.2 kg)  Height: 5\' 11"  (1.803 m)   Body mass index is 44.66 kg/m.;s Gen: Pleasant, obese man, in no distress,  normal affect  ENT: No lesions,  mouth clear,  oropharynx clear, no postnasal drip  Neck: No JVD, no stridor  Lungs: No use of accessory muscles, no crackles or wheezing on normal respiration, no wheeze on forced expiration  Cardiovascular: RRR, heart sounds normal, no murmur or gallops, trace pretibial peripheral edema  Musculoskeletal: No deformities, no cyanosis or clubbing  Neuro: alert, awake, non focal  Skin: Warm, no lesions or rash     Assessment & Plan:  Asthma [name] carries a history of longstanding atopic asthma with elevated IgE.  He has been well managed on  Xolair in [place], 300 mg every 4 weeks.  We will check his IgE, eosinophil count today and calculate appropriate dosing.  Plan to reinitiate his Xolair as soon as we have that information.  Continue to use Singulair fluticasone nasal spray to treat his rhinitis, his PPI and Pepcid to treat GERD as a contributor.  He has albuterol which she can use as needed.  Note that he is on an ACE inhibitor.  If cough or refractory symptoms evolve then we should likely change to an alternative.  We will obtain full PFT and compare to his studies from [place].  Allergic rhinitis Xolair, singulair, fluticasone NS. He did not benefit in [place] from immunotherapy.  GERD (gastroesophageal reflux disease) Pepcid, PPI as ordered   Baltazar Apo, MD, PhD 11/12/2019, 4:24 PM [facility] [phone] or if no answer [phone]

## 2019-11-12 NOTE — Assessment & Plan Note (Signed)
Chronic, stable.  Continue current medication regimen and adjust as needed.  Consider referral to GI if worsening symptoms.  Reports history of multiple EGD.

## 2019-11-12 NOTE — Assessment & Plan Note (Signed)
Ongoing issue, was recommended to have back surgery but due to weight and fear of anesthesia he is hesitant.  Continue Biofreeze and Tylenol as needed.  He is not wishing to take much pain medication.  Could consider physical therapy if worsening or use of alternate pain medications for radiculopathy such as Duloxetine or Gabapentin.  Return in 4 weeks.

## 2019-11-12 NOTE — Assessment & Plan Note (Signed)
Chronic, stable with BPs on home readings at goal.  Continue current medication regimen and adjust as needed.  No refills needed today.  Continue to monitor BP at home regularly.  Return in 4 weeks for annual physical with labs.

## 2019-11-12 NOTE — Assessment & Plan Note (Signed)
Pepcid, PPI as ordered

## 2019-11-12 NOTE — Assessment & Plan Note (Signed)
Diagnosed several years ago per patient.  Continue to monitor and consider referral to pulmonary or ENT if worsening symptoms.

## 2019-11-12 NOTE — Assessment & Plan Note (Signed)
Ongoing to back, shoulders, and hips.  Continue Tylenol & Biofreeze as needed, uses minimally and prefers not to take many pain medications.  Recommend focus on weight loss, which would benefit joint pain.

## 2019-11-12 NOTE — Assessment & Plan Note (Signed)
Recommend continued focus on healthy diet choices and regular physical activity (30 minutes 5 days a week).  Recommend small steps and goals at a time.

## 2019-11-12 NOTE — Assessment & Plan Note (Signed)
Xolair, singulair, fluticasone NS. He did not benefit in [place] from immunotherapy.

## 2019-11-12 NOTE — Assessment & Plan Note (Signed)
Chronic, ongoing.  Continue current medication regimen and adjust as needed.  Avoid elements that cause reactions, such as animals. 

## 2019-11-12 NOTE — Assessment & Plan Note (Signed)
Chronic, stable.  No recent exacerbations.  Appears very allergy driven, especially to animals.  Continue current medication regimen and adjust as needed, minimal use at this time of Albuterol.  Return in 4 weeks for annual physical.

## 2019-11-12 NOTE — Patient Instructions (Signed)
DASH Eating Plan DASH stands for "Dietary Approaches to Stop Hypertension." The DASH eating plan is a healthy eating plan that has been shown to reduce high blood pressure (hypertension). It may also reduce your risk for type 2 diabetes, heart disease, and stroke. The DASH eating plan may also help with weight loss. What are tips for following this plan?  General guidelines  Avoid eating more than 2,300 mg (milligrams) of salt (sodium) a day. If you have hypertension, you may need to reduce your sodium intake to 1,500 mg a day.  Limit alcohol intake to no more than 1 drink a day for nonpregnant women and 2 drinks a day for men. One drink equals 12 oz of beer, 5 oz of wine, or 1 oz of hard liquor.  Work with your health care provider to maintain a healthy body weight or to lose weight. Ask what an ideal weight is for you.  Get at least 30 minutes of exercise that causes your heart to beat faster (aerobic exercise) most days of the week. Activities may include walking, swimming, or biking.  Work with your health care provider or diet and nutrition specialist (dietitian) to adjust your eating plan to your individual calorie needs. Reading food labels   Check food labels for the amount of sodium per serving. Choose foods with less than 5 percent of the Daily Value of sodium. Generally, foods with less than 300 mg of sodium per serving fit into this eating plan.  To find whole grains, look for the word "whole" as the first word in the ingredient list. Shopping  Buy products labeled as "low-sodium" or "no salt added."  Buy fresh foods. Avoid canned foods and premade or frozen meals. Cooking  Avoid adding salt when cooking. Use salt-free seasonings or herbs instead of table salt or sea salt. Check with your health care provider or pharmacist before using salt substitutes.  Do not fry foods. Cook foods using healthy methods such as baking, boiling, grilling, and broiling instead.  Cook with  heart-healthy oils, such as olive, canola, soybean, or sunflower oil. Meal planning  Eat a balanced diet that includes: ? 5 or more servings of fruits and vegetables each day. At each meal, try to fill half of your plate with fruits and vegetables. ? Up to 6-8 servings of whole grains each day. ? Less than 6 oz of lean meat, poultry, or fish each day. A 3-oz serving of meat is about the same size as a deck of cards. One egg equals 1 oz. ? 2 servings of low-fat dairy each day. ? A serving of nuts, seeds, or beans 5 times each week. ? Heart-healthy fats. Healthy fats called Omega-3 fatty acids are found in foods such as flaxseeds and coldwater fish, like sardines, salmon, and mackerel.  Limit how much you eat of the following: ? Canned or prepackaged foods. ? Food that is high in trans fat, such as fried foods. ? Food that is high in saturated fat, such as fatty meat. ? Sweets, desserts, sugary drinks, and other foods with added sugar. ? Full-fat dairy products.  Do not salt foods before eating.  Try to eat at least 2 vegetarian meals each week.  Eat more home-cooked food and less restaurant, buffet, and fast food.  When eating at a restaurant, ask that your food be prepared with less salt or no salt, if possible. What foods are recommended? The items listed may not be a complete list. Talk with your dietitian about   what dietary choices are best for you. Grains Whole-grain or whole-wheat bread. Whole-grain or whole-wheat pasta. Brown rice. Oatmeal. Quinoa. Bulgur. Whole-grain and low-sodium cereals. Pita bread. Low-fat, low-sodium crackers. Whole-wheat flour tortillas. Vegetables Fresh or frozen vegetables (raw, steamed, roasted, or grilled). Low-sodium or reduced-sodium tomato and vegetable juice. Low-sodium or reduced-sodium tomato sauce and tomato paste. Low-sodium or reduced-sodium canned vegetables. Fruits All fresh, dried, or frozen fruit. Canned fruit in natural juice (without  added sugar). Meat and other protein foods Skinless chicken or turkey. Ground chicken or turkey. Pork with fat trimmed off. Fish and seafood. Egg whites. Dried beans, peas, or lentils. Unsalted nuts, nut butters, and seeds. Unsalted canned beans. Lean cuts of beef with fat trimmed off. Low-sodium, lean deli meat. Dairy Low-fat (1%) or fat-free (skim) milk. Fat-free, low-fat, or reduced-fat cheeses. Nonfat, low-sodium ricotta or cottage cheese. Low-fat or nonfat yogurt. Low-fat, low-sodium cheese. Fats and oils Soft margarine without trans fats. Vegetable oil. Low-fat, reduced-fat, or light mayonnaise and salad dressings (reduced-sodium). Canola, safflower, olive, soybean, and sunflower oils. Avocado. Seasoning and other foods Herbs. Spices. Seasoning mixes without salt. Unsalted popcorn and pretzels. Fat-free sweets. What foods are not recommended? The items listed may not be a complete list. Talk with your dietitian about what dietary choices are best for you. Grains Baked goods made with fat, such as croissants, muffins, or some breads. Dry pasta or rice meal packs. Vegetables Creamed or fried vegetables. Vegetables in a cheese sauce. Regular canned vegetables (not low-sodium or reduced-sodium). Regular canned tomato sauce and paste (not low-sodium or reduced-sodium). Regular tomato and vegetable juice (not low-sodium or reduced-sodium). Pickles. Olives. Fruits Canned fruit in a light or heavy syrup. Fried fruit. Fruit in cream or butter sauce. Meat and other protein foods Fatty cuts of meat. Ribs. Fried meat. Bacon. Sausage. Bologna and other processed lunch meats. Salami. Fatback. Hotdogs. Bratwurst. Salted nuts and seeds. Canned beans with added salt. Canned or smoked fish. Whole eggs or egg yolks. Chicken or turkey with skin. Dairy Whole or 2% milk, cream, and half-and-half. Whole or full-fat cream cheese. Whole-fat or sweetened yogurt. Full-fat cheese. Nondairy creamers. Whipped toppings.  Processed cheese and cheese spreads. Fats and oils Butter. Stick margarine. Lard. Shortening. Ghee. Bacon fat. Tropical oils, such as coconut, palm kernel, or palm oil. Seasoning and other foods Salted popcorn and pretzels. Onion salt, garlic salt, seasoned salt, table salt, and sea salt. Worcestershire sauce. Tartar sauce. Barbecue sauce. Teriyaki sauce. Soy sauce, including reduced-sodium. Steak sauce. Canned and packaged gravies. Fish sauce. Oyster sauce. Cocktail sauce. Horseradish that you find on the shelf. Ketchup. Mustard. Meat flavorings and tenderizers. Bouillon cubes. Hot sauce and Tabasco sauce. Premade or packaged marinades. Premade or packaged taco seasonings. Relishes. Regular salad dressings. Where to find more information:  National Heart, Lung, and Blood Institute: www.nhlbi.nih.gov  American Heart Association: www.heart.org Summary  The DASH eating plan is a healthy eating plan that has been shown to reduce high blood pressure (hypertension). It may also reduce your risk for type 2 diabetes, heart disease, and stroke.  With the DASH eating plan, you should limit salt (sodium) intake to 2,300 mg a day. If you have hypertension, you may need to reduce your sodium intake to 1,500 mg a day.  When on the DASH eating plan, aim to eat more fresh fruits and vegetables, whole grains, lean proteins, low-fat dairy, and heart-healthy fats.  Work with your health care provider or diet and nutrition specialist (dietitian) to adjust your eating plan to your   individual calorie needs. This information is not intended to replace advice given to you by your health care provider. Make sure you discuss any questions you have with your health care provider. Document Revised: 09/27/2017 Document Reviewed: 10/08/2016 Elsevier Patient Education  2020 Elsevier Inc.  

## 2019-11-12 NOTE — Assessment & Plan Note (Signed)
Chronic, stable with good compliance to CPAP.  Continue to utilize daily. 

## 2019-11-12 NOTE — Progress Notes (Signed)
New Patient Office Visit  Subjective:  Patient ID: Jose Liu, male    DOB: 26-Dec-1955  Age: 64 y.o. MRN: [id number]  CC:  Chief Complaint  Patient presents with  . Establish Care    . This visit was completed via Doximity due to the restrictions of the COVID-19 pandemic. All issues as above were discussed and addressed. Physical exam was done as above through visual confirmation on Doximity. If it was felt that the patient should be evaluated in the office, they were directed there. The patient verbally consented to this visit. . Location of the patient: home . Location of the provider: home . Those involved with this call:  . Provider: Marnee Guarneri, DNP . CMA: Yvonna Alanis, CMA . Front Desk/Registration: Don Perking  . Time spent on call: 30 minutes with patient face to face via video conference. More than 50% of this time was spent in counseling and coordination of care. 15 minutes total spent in review of patient's record and preparation of their chart.  . I verified patient identity using two factors (patient name and date of birth). Patient consents verbally to being seen via telemedicine visit today.    HPI Jose Liu presents for new patient visit to establish care.  Introduced to Designer, jewellery role and practice setting.  All questions answered.  Recently moved here from [place] in December 1st. No records available.  His wife and him recently moved to [place] from [place], currently they are living in their mobile home due to the home they purchased having recent Covid + sellers who are still in house.  HYPERTENSION Currently taking Losartan-HCTZ 10-12.5 MG daily. 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  Has OSA and uses CPAP 100% of the time. Hypertension status: stable  Satisfied with current treatment? yes Duration of hypertension: chronic BP monitoring frequency:  daily BP range: 120/70's on average BP medication side effects:   no Medication compliance: good compliance Previous BP meds:Losartan-HCTZ Aspirin: no Recurrent headaches: no Visual changes: no Palpitations: no Dyspnea: no Chest pain: no Lower extremity edema: no Dizzy/lightheaded: no   GERD Continues on Protonix 40 MG daily and Famotidine 40 MG daily.  States his appendix and bowels are situated differently.  Had a colonoscopy last a couple years ago. GERD control status: stable  Satisfied with current treatment? yes Heartburn frequency: none Medication side effects: no  Medication compliance: stable Previous GERD medications: Antacid use frequency:  none Dysphagia: no Odynophagia:  no Hematemesis: no Blood in stool: no EGD: no   ASTHMA/ALLERGIC RHINITIS Takes Flonase nasal spray and Singulair 10 MG daily.  States he has narrow sinuses.  Reports that Zpack does not work for him when he gets sinusitis, which he gets frequently.  States a prior provider gave him something "really strong in past and made me resistant to other antibiotics". Asthma status: stable Satisfied with current treatment?: yes Albuterol/rescue inhaler frequency: once a month use, if around cats Dyspnea frequency: rarely Wheezing frequency: rarely Cough frequency: none Nocturnal symptom frequency: none Limitation of activity: no Current upper respiratory symptoms: no Triggers: allergies, cats or dogs Home peak flows: none Last Spirometry: unknown Failed/intolerant to following asthma meds: none Asthma meds in past: Albuterol Aerochamber/spacer use: no Visits to ER or Urgent Care in past year: no Pneumovax: refuses Influenza: refuses, make him sick   BACK PAIN States he is supposed to have back surgery, his "L4-L5 are crushed". Has radiculopathy, left leg.  Has arthritis and bursitis in hips.  Has  been told he has shoulder issues too, with impingement due to arthritis.  Is fearful of surgery for back, which has been recommended, due to past experiences with  surgery and not receiving enough anesthesia.  Reports he often needs more anesthesia then normal and has had bad experiences with rapidly being woken up after surgery.   Duration: chronic Mechanism of injury: unknown Location: bilateral and low back Onset: gradual Severity: mild Quality: dull and aching Frequency: intermittent Radiation: L leg below the knee Aggravating factors: lifting, movement, walking and bending Alleviating factors: APAP Status: stable Treatments attempted: APAP  Relief with NSAIDs?: No NSAIDs Taken Nighttime pain:  no Paresthesias / decreased sensation:  no Bowel / bladder incontinence:  no Fevers:  no Dysuria / urinary frequency:  no   Past Medical History:  Diagnosis Date  . Allergies   . Asthma   . GERD (gastroesophageal reflux disease)   . Hypertension   . Situs inversus     Past Surgical History:  Procedure Laterality Date  . CARDIAC ELECTROPHYSIOLOGY STUDY AND ABLATION    . EYE SURGERY      Family History  Problem Relation Age of Onset  . CAD Mother        quadruple CABG  . Stroke Mother   . Hypertension Mother   . Dementia Father   . Prostate cancer Father     Social History   Socioeconomic History  . Marital status: Married    Spouse name: Not on file  . Number of children: Not on file  . Years of education: Not on file  . Highest education level: Not on file  Occupational History  . Not on file  Tobacco Use  . Smoking status: Never Smoker  . Smokeless tobacco: Never Used  Substance and Sexual Activity  . Alcohol use: Yes    Comment: on rare occasion  . Drug use: Never  . Sexual activity: Not on file  Other Topics Concern  . Not on file  Social History Narrative  . Not on file   Social Determinants of Health   Financial Resource Strain: Low Risk   . Difficulty of Paying Living Expenses: Not hard at all  Food Insecurity: No Food Insecurity  . Worried About Charity fundraiser in the Last Year: Never true  . Ran  Out of Food in the Last Year: Never true  Transportation Needs: No Transportation Needs  . Lack of Transportation (Medical): No  . Lack of Transportation (Non-Medical): No  Physical Activity: Inactive  . Days of Exercise per Week: 0 days  . Minutes of Exercise per Session: 0 min  Stress: No Stress Concern Present  . Feeling of Stress : Not at all  Social Connections: Slightly Isolated  . Frequency of Communication with Friends and Family: Three times a week  . Frequency of Social Gatherings with Friends and Family: Three times a week  . Attends Religious Services: Never  . Active Member of Clubs or Organizations: Yes  . Attends Archivist Meetings: More than 4 times per year  . Marital Status: Married  Human resources officer Violence:   . Fear of Current or Ex-Partner: Not on file  . Emotionally Abused: Not on file  . Physically Abused: Not on file  . Sexually Abused: Not on file    ROS Review of Systems  Constitutional: Negative for activity change, diaphoresis, fatigue and fever.  Respiratory: Negative for cough, chest tightness, shortness of breath and wheezing.   Cardiovascular: Negative for  chest pain, palpitations and leg swelling.  Gastrointestinal: Negative.   Endocrine: Negative.   Musculoskeletal: Positive for back pain.  Neurological: Negative.   Psychiatric/Behavioral: Negative.     Objective:   Today's Vitals: BP 123/73   Pulse 85   Wt (!) 310 lb (140.6 kg)   BMI 43.24 kg/m   Physical Exam Vitals and nursing note reviewed.  Constitutional:      General: He is awake. He is not in acute distress.    Appearance: He is well-developed. He is obese. He is not ill-appearing.  HENT:     Head: Normocephalic.     Right Ear: Hearing normal. No drainage.     Left Ear: Hearing normal. No drainage.  Eyes:     General: Lids are normal.        Right eye: No discharge.        Left eye: No discharge.     Conjunctiva/sclera: Conjunctivae normal.  Pulmonary:      Effort: Pulmonary effort is normal. No accessory muscle usage or respiratory distress.  Musculoskeletal:     Cervical back: Normal range of motion.  Neurological:     Mental Status: He is alert and oriented to person, place, and time.  Psychiatric:        Mood and Affect: Mood normal.        Behavior: Behavior normal. Behavior is cooperative.        Thought Content: Thought content normal.        Judgment: Judgment normal.    Assessment & Plan:   Problem List Items Addressed This Visit      Cardiovascular and Mediastinum   Essential hypertension    Chronic, stable with BPs on home readings at goal.  Continue current medication regimen and adjust as needed.  No refills needed today.  Continue to monitor BP at home regularly.  Return in 4 weeks for annual physical with labs.        Respiratory   Asthma    Chronic, stable.  No recent exacerbations.  Appears very allergy driven, especially to animals.  Continue current medication regimen and adjust as needed, minimal use at this time of Albuterol.  Return in 4 weeks for annual physical.      Relevant Medications   albuterol (VENTOLIN HFA) 108 (90 Base) MCG/ACT inhaler   OSA (obstructive sleep apnea)    Chronic, stable with good compliance to CPAP.  Continue to utilize daily.      Allergic rhinitis    Chronic, ongoing.  Continue current medication regimen and adjust as needed.  Avoid elements that cause reactions, such as animals.        Digestive   GERD (gastroesophageal reflux disease)    Chronic, stable.  Continue current medication regimen and adjust as needed.  Consider referral to GI if worsening symptoms.  Reports history of multiple EGD.      Relevant Medications   famotidine (PEPCID) 40 MG tablet   pantoprazole (PROTONIX) 40 MG tablet     Musculoskeletal and Integument   Arthritis    Ongoing to back, shoulders, and hips.  Continue Tylenol & Biofreeze as needed, uses minimally and prefers not to take many pain  medications.  Recommend focus on weight loss, which would benefit joint pain.          Other   Situs inversus    Diagnosed several years ago per patient.  Continue to monitor and consider referral to pulmonary or ENT if worsening symptoms.  Low back pain    Ongoing issue, was recommended to have back surgery but due to weight and fear of anesthesia he is hesitant.  Continue Biofreeze and Tylenol as needed.  He is not wishing to take much pain medication.  Could consider physical therapy if worsening or use of alternate pain medications for radiculopathy such as Duloxetine or Gabapentin.  Return in 4 weeks.      Morbid obesity ([place]) - Primary    Recommend continued focus on healthy diet choices and regular physical activity (30 minutes 5 days a week).  Recommend small steps and goals at a time.         Outpatient Encounter Medications as of 11/12/2019  Medication Sig  . albuterol (VENTOLIN HFA) 108 (90 Base) MCG/ACT inhaler Inhale 1 puff into the lungs every 6 (six) hours as needed.  . famotidine (PEPCID) 40 MG tablet Take 40 mg by mouth daily.  . fluticasone (FLONASE) 50 MCG/ACT nasal spray Place 2 sprays into both nostrils daily.  Marland Kitchen lisinopril-hydrochlorothiazide (ZESTORETIC) 10-12.5 MG tablet Take 1 tablet by mouth daily.  . montelukast (SINGULAIR) 10 MG tablet Take 10 mg by mouth daily.  . pantoprazole (PROTONIX) 40 MG tablet Take 40 mg by mouth daily.  Marland Kitchen UNABLE TO FIND Xolair injection monthly for allergies  . [DISCONTINUED] RABEprazole (ACIPHEX) 20 MG tablet Take 20 mg by mouth daily.   No facility-administered encounter medications on file as of 11/12/2019.   I discussed the assessment and treatment plan with the patient. The patient was provided an opportunity to ask questions and all were answered. The patient agreed with the plan and demonstrated an understanding of the instructions.   The patient was advised to call back or seek an in-person evaluation if the symptoms  worsen or if the condition fails to improve as anticipated.   I provided 30 minutes of time during this encounter.  Follow-up: Return in about 4 weeks (around 12/10/2019) for Annual physical if insurance will cover and if not then follow-up visit.   Venita Lick, NP

## 2019-11-12 NOTE — Assessment & Plan Note (Signed)
Jose Liu carries a history of longstanding atopic asthma with elevated IgE.  He has been well managed on Xolair in [place], 300 mg every 4 weeks.  We will check his IgE, eosinophil count today and calculate appropriate dosing.  Plan to reinitiate his Xolair as soon as we have that information.  Continue to use Singulair fluticasone nasal spray to treat his rhinitis, his PPI and Pepcid to treat GERD as a contributor.  He has albuterol which she can use as needed.  Note that he is on an ACE inhibitor.  If cough or refractory symptoms evolve then we should likely change to an alternative.  We will obtain full PFT and compare to his studies from [place].

## 2019-11-13 ENCOUNTER — Telehealth: Payer: Self-pay | Admitting: Emergency Medicine

## 2019-11-13 LAB — CBC WITH DIFFERENTIAL/PLATELET
Basophils Absolute: 0.1 10*3/uL (ref 0.0–0.1)
Basophils Relative: 1.4 % (ref 0.0–3.0)
Eosinophils Absolute: 0.2 10*3/uL (ref 0.0–0.7)
Eosinophils Relative: 2.7 % (ref 0.0–5.0)
HCT: 47.5 % (ref 39.0–52.0)
Hemoglobin: 15.6 g/dL (ref 13.0–17.0)
Lymphocytes Relative: 17.9 % (ref 12.0–46.0)
Lymphs Abs: 1.5 10*3/uL (ref 0.7–4.0)
MCHC: 32.9 g/dL (ref 30.0–36.0)
MCV: 92.1 fl (ref 78.0–100.0)
Monocytes Absolute: 1 10*3/uL (ref 0.1–1.0)
Monocytes Relative: 11.4 % (ref 3.0–12.0)
Neutro Abs: 5.6 10*3/uL (ref 1.4–7.7)
Neutrophils Relative %: 66.6 % (ref 43.0–77.0)
Platelets: 252 10*3/uL (ref 150.0–400.0)
RBC: 5.16 Mil/uL (ref 4.22–5.81)
RDW: 13.4 % (ref 11.5–15.5)
WBC: 8.3 10*3/uL (ref 4.0–10.5)

## 2019-11-14 LAB — IGE: IgE (Immunoglobulin E), Serum: 182 kU/L — ABNORMAL HIGH (ref <=114)

## 2019-11-14 LAB — CBC WITH DIFFERENTIAL/PLATELET

## 2019-11-16 MED ORDER — EPINEPHRINE 0.3 MG/0.3ML IJ SOAJ: 0.3000 mg | Freq: Once | INTRAMUSCULAR | 2 refills | Status: AC

## 2019-11-16 NOTE — Telephone Encounter (Signed)
Spoke with pt. He is needing to schedule his first [place] appointment with our office. Pt is currently on 300mg  q4w. When he came in for his first appointment with [facility], he brought in a 90 day supply of Xolair. This medication was placed in the refrigerator in the injection room. New enrollment forms were filled out with our office information on it. These have been faxed in. Pt has been scheduled for 11/19/19 at 1330. Pt is aware of our office policy when it comes to their first injection >> 2 hours wait, Epipen.  Rx for Epipen has been sent in. Nothing further is needed at this time as he is already on this treatment.

## 2019-11-18 ENCOUNTER — Inpatient Hospital Stay: Admit: 2019-11-18 | Payer: BLUE CROSS/BLUE SHIELD | Primary: Family Medicine

## 2019-11-18 NOTE — Progress Notes (Signed)
 Progress Notes by [name], PT at 11/18/19 0820                Author: [name], PT  Service: Physical Therapy  Author Type: Physical Therapist       Filed: 11/18/19 1428  Date of Service: 11/18/19 0820  Status: Signed          Editor: [name], PT (Physical Therapist)               Steven [name] Rea   DOB: October 11, 1956   Primary: [name] [employer]*   Secondary:   [facility]   [address], [address], [place] [address]   Phone:[phone]   [phone]                 OUTPATIENT PHYSICAL THERAPY: Daily Treatment Note 11/18/2019   Visit Count:  6       ICD-10: Treatment Diagnosis:      Pain in right shoulder (M25.511)     Stiffness of right shoulder, not elsewhere classified (M25.611)   Precautions/Allergies:    Nsaids (non-steroidal anti-inflammatory drug)    TREATMENT PLAN:   Effective Dates: 10/08/2019 TO 01/06/2020 (90 days).  Frequency/Duration : 2 times a week for 90 Day(s)      Pre-treatment Symptoms/Complaints:  11/18/2019 : Patient reports he is feeling stronger all the time.   Pain:  Initial: Pain Intensity 1: 2   Pain Location 1: Shoulder   Pain Orientation 1: Right   Pain Intervention(s) 1: Rest, Medication (see MAR)   Post Session:  2/10        Medications Last Reviewed:  11/18/2019   Updated Objective Findings:   None Today   TREATMENT:           THERAPEUTIC EXERCISE: (30 minutes):  Exercises per grid below to improve mobility and strength.  Required minimal visual, verbal and manual cues to promote proper body alignment, promote proper  body posture and promote proper body mechanics.  Progressed resistance, range, repetitions and complexity of movement as indicated.     Date:   11/18/2019      Activity/Exercise  Parameters      Pulleys  To tolerance in flexion   20 reps      UBE  6 minutes   Level 1      Wall ladder  5 reps to tolerance      Theraband rows  Green t-band   20 reps      Theraband pulls  Green t-band   20  reps      Theraband internal rotation  Green t-band   15 reps   R UE      Theraband external rotation  Green t-band   20reps   R UE      Bent over rows  8 pounds   R UE   15 reps      Bicep curls  8 pounds   R UE   15 pounds      Flexion  Supine with wand   15 reps      External rotation  Supine with wand   15 reps           MANUAL THERAPY: (10 minutes): Joint mobilization, Soft tissue mobilization and PROM to R UE was utilized and necessary because of the patient's restricted joint motion, loss of articular motion  and restricted motion of soft tissue.  Treatment/Session Summary:       Response to Treatment:  Patient tolerated treatment well with improving overall  mobility.       Communication/Consultation:   None today     Equipment provided today:   None today     Recommendations/Intent for next treatment session: Next visit will focus on improving overall mobility and strength in R UE with daily activities.      Total Treatment Billable Duration:  40 minutes    PT Patient Time In/Time Out   Time In: 1345   Time Out: 1430   [name], PT      Future Appointments      Date  Time  Provider  Department  Center      11/18/2019   1:45 PM  [name], PT  [facility]  SFE      11/25/2019   2:30 PM  Sams, Delon LOISE, PT  [facility]  SFE

## 2019-11-18 NOTE — Progress Notes (Signed)
Steven [name]  DOB: 07-15-1956  Primary: [employer]s Essentials*  Secondary:  [facility]  [address], [place] [address]  Phone:[phone]   Fax:[phone]        OUTPATIENT PHYSICAL THERAPY: Daily Treatment Note 11/18/2019  Visit Count:  6    ICD-10: Treatment Diagnosis:   ?? Pain in right shoulder (M25.511)  ?? Stiffness of right shoulder, not elsewhere classified (M25.611)  Precautions/Allergies:   Nsaids (non-steroidal anti-inflammatory drug)   TREATMENT PLAN:  Effective Dates: 10/08/2019 TO 01/06/2020 (90 days).  Frequency/Duration: 2 times a week for 90 Day(s)    Pre-treatment Symptoms/Complaints:  11/18/2019: Patient reports he is feeling stronger all the time.  Pain: Initial: Pain Intensity 1: 2  Pain Location 1: Shoulder  Pain Orientation 1: Right  Pain Intervention(s) 1: Rest, Medication (see MAR)  Post Session:  2/10   Medications Last Reviewed:  11/18/2019  Updated Objective Findings:  None Today  TREATMENT:     THERAPEUTIC EXERCISE: (30 minutes):  Exercises per grid below to improve mobility and strength.  Required minimal visual, verbal and manual cues to promote proper body alignment, promote proper body posture and promote proper body mechanics.  Progressed resistance, range, repetitions and complexity of movement as indicated.   Date:  11/18/2019   Activity/Exercise Parameters   Pulleys To tolerance in flexion  20 reps   UBE 6 minutes  Level 1   Wall ladder 5 reps to tolerance   Theraband rows Green t-band  20 reps   Theraband pulls Green t-band  20 reps   Theraband internal rotation Green t-band  15 reps  R UE   Theraband external rotation Green t-band  20reps  R UE   Bent over rows 8 pounds  R UE  15 reps   Bicep curls 8 pounds  R UE  15 pounds   Flexion Supine with wand  15 reps   External rotation Supine with wand  15 reps      MANUAL THERAPY: (10 minutes): Joint mobilization, Soft tissue mobilization and PROM to R UE was utilized and necessary because of the patient's restricted joint motion, loss of articular motion and restricted motion of soft tissue.       Treatment/Session Summary:    ?? Response to Treatment:  Patient tolerated treatment well with improving overall mobility.    ?? Communication/Consultation:  None today  ?? Equipment provided today:  None today  ?? Recommendations/Intent for next treatment session: Next visit will focus on improving overall mobility and strength in R UE with daily activities.    Total Treatment Billable Duration:  40 minutes   PT Patient Time In/Time Out  Time In: 1345  Time Out: 1430  Allene Dillon, PT    Future Appointments   Date Time Provider Department Center   11/18/2019  1:45 PM Allene Dillon, PT [facility] SFE   11/25/2019  2:30 PM Rexton Greulich, Gregery Na, PT [facility] SFE

## 2019-11-19 ENCOUNTER — Other Ambulatory Visit: Payer: Self-pay

## 2019-11-19 ENCOUNTER — Ambulatory Visit (INDEPENDENT_AMBULATORY_CARE_PROVIDER_SITE_OTHER): Payer: No Typology Code available for payment source

## 2019-11-19 DIAGNOSIS — J4520 Mild intermittent asthma, uncomplicated: Secondary | ICD-10-CM | POA: Diagnosis not present

## 2019-11-19 MED ORDER — OMALIZUMAB 150 MG ~~LOC~~ SOLR
300.0000 mg | SUBCUTANEOUS | Status: DC
  Administered 2019-11-19: 15:00:00 300 mg via SUBCUTANEOUS

## 2019-11-19 NOTE — Progress Notes (Signed)
Patient presented to the office today for first-time Xolair injection.  Primary Pulmonologist: Baltazar Apo MD Medication name: Xolair Strength: 300mg  Site(s): 1.78ml x's 1 left arm              1.2 ml x's 1 Right arm   Epi pen/Auvi-Q visible during appointment: Yes  Time of injection: 1330  Patient evaluated every 15-20 minutes per protocol x2 hours.  1st check: 1345 Evaluation: Patient sitting and talking.  Patient denies any side or adverse effects.  No redness or pain at injection site.  2nd check: 1400  Evaluation:Patient sitting quietly.  Patient denies any side or adverse effects.  No redness or itching at injection sites.  3rd check: 1415  Evaluation: Patient sitting quietly.  Patient denies any side or adverse effects.  No redness at injection sites.  4th check: 1430   Evaluation:Patient sitting quietly.  Patient denies any side or adverse effects.  No redness or itching at injection sites.  5th check: 1445  Evaluation:Patient sitting and talking.  Denies any side or adverse effects.  No redness at injection sites.  6th check: 1500  Evaluation: Patient sitting and talking on phone.  No signs of distress.  7th check: 1515  Evaluation: Patient sitting quietly. Patient denies any side or adverse effects.    8th check: 1530  Evaluation:Patient sitting quietly.  Patient denies any side or adverse effects.  No redness or swelling at injection sites.   Patient demonstrated use of Epipen.  Went over side and adverse effects to be aware of.   Patient stated understanding. Nothing further at this time.

## 2019-11-24 ENCOUNTER — Telehealth: Payer: Self-pay | Admitting: Emergency Medicine

## 2019-11-24 NOTE — Telephone Encounter (Signed)
Received a summary of benefits for the pt's Xolair. Pt already has a PA on file. His specialty pharmacy is [facility].

## 2019-11-25 ENCOUNTER — Inpatient Hospital Stay: Admit: 2019-11-25 | Payer: BLUE CROSS/BLUE SHIELD | Primary: Family Medicine

## 2019-11-25 NOTE — Progress Notes (Signed)
 Progress Notes by [name], PT at 11/25/19 9042                Author: [name], PT  Service: Physical Therapy  Author Type: Physical Therapist       Filed: 11/25/19 1554  Date of Service: 11/25/19 0957  Status: Addendum          Editor: [name], PT (Physical Therapist)          Related Notes: Original Note by [name], PT (Physical Therapist) filed at 11/25/19  1552               Ozell LITTIE Rea   DOB: 1956-08-18   Primary: [name] Essentials*   Secondary:   [facility]   [address], [address], [place] [address]   Phone:[phone]   Fax:[phone]                 OUTPATIENT PHYSICAL THERAPY: Daily Treatment Note 11/25/2019   Visit Count:  7       ICD-10: Treatment Diagnosis:      Pain in right shoulder (M25.511)     Stiffness of right shoulder, not elsewhere classified (M25.611)   Precautions/Allergies:    Nsaids (non-steroidal anti-inflammatory drug)    TREATMENT PLAN:   Effective Dates: 10/08/2019 TO 01/06/2020 (90 days).  Frequency/Duration : 2 times a week for 90 Day(s)      Pre-treatment Symptoms/Complaints:  11/25/2019 : Patient reports he is doing well and feeling stronger all the time.   Pain:  Initial: Pain Intensity 1: 2   Pain Location 1: Shoulder   Pain Orientation 1: Right   Pain Intervention(s) 1: Rest, Medication (see MAR)   Post Session:  2/10        Medications Last Reviewed:  11/25/2019   Updated Objective Findings:   None Today   TREATMENT:           THERAPEUTIC EXERCISE: (30 minutes):  Exercises per grid below to improve mobility and strength.  Required minimal visual, verbal and manual cues to promote proper body alignment, promote proper  body posture and promote proper body mechanics.  Progressed resistance, range, repetitions and complexity of movement as indicated.     Date:   11/25/2019      Activity/Exercise  Parameters      Pulleys  To tolerance in flexion   20 reps      UBE  6 minutes   Level 1       Wall ladder  5 reps to tolerance      Theraband rows  Blue t-band   20 reps      Theraband pulls  Blue t-band   20 reps      Theraband internal rotation  Blue t-band   15 reps   R UE      Theraband external rotation  Blue t-band   20reps   R UE      Bent over rows  12 pounds   R UE   15 reps      Bicep curls  12 pounds   R UE   15 reps      Tricep extension  10 pounds   R UE   15 reps      Flexion  Supine with wand   15 reps      External rotation  Supine with wand   15 reps  MANUAL THERAPY: (10 minutes): Joint mobilization, Soft tissue mobilization and PROM to R UE was utilized and necessary because of the patient's restricted joint motion, loss of articular motion  and restricted motion of soft tissue.         Treatment/Session Summary:       Response to Treatment:  Patient tolerated treatment well with improving overall  mobility.       Communication/Consultation:   None today     Equipment provided today:   None today     Recommendations/Intent for next treatment session: Next visit will focus on improving overall mobility and strength in R UE with daily activities.      Total Treatment Billable Duration:  40 minutes    PT Patient Time In/Time Out   Time In: 1430   Time Out: 1515   [name], PT      Future Appointments      Date  Time  Provider  Department  Center      12/02/2019   1:45 PM  [name], PTA  [facility]  SFE      12/09/2019   1:45 PM  [name], PT  [facility]  SFE      12/16/2019   1:45 PM  [name], PTA  [facility]  SFE      12/23/2019   1:45 PM  Sams, Delon LOISE, PT  [facility]  SFE

## 2019-11-25 NOTE — Progress Notes (Addendum)
Steven [name]  DOB: 10-Dec-1955  Primary: [employer] [facility]s Essentials*  Secondary:  [facility]  [address], [address], [place] [address]  Phone:[phone]   Fax:[phone]        OUTPATIENT PHYSICAL THERAPY: Daily Treatment Note 11/25/2019  Visit Count:  7    ICD-10: Treatment Diagnosis:   ?? Pain in right shoulder (M25.511)  ?? Stiffness of right shoulder, not elsewhere classified (M25.611)  Precautions/Allergies:   Nsaids (non-steroidal anti-inflammatory drug)   TREATMENT PLAN:  Effective Dates: 10/08/2019 TO 01/06/2020 (90 days).  Frequency/Duration: 2 times a week for 90 Day(s)    Pre-treatment Symptoms/Complaints:  11/25/2019: Patient reports he is doing well and feeling stronger all the time.  Pain: Initial: Pain Intensity 1: 2  Pain Location 1: Shoulder  Pain Orientation 1: Right  Pain Intervention(s) 1: Rest, Medication (see MAR)  Post Session:  2/10   Medications Last Reviewed:  11/25/2019  Updated Objective Findings:  None Today  TREATMENT:     THERAPEUTIC EXERCISE: (30 minutes):  Exercises per grid below to improve mobility and strength.  Required minimal visual, verbal and manual cues to promote proper body alignment, promote proper body posture and promote proper body mechanics.  Progressed resistance, range, repetitions and complexity of movement as indicated.   Date:  11/25/2019   Activity/Exercise Parameters   Pulleys To tolerance in flexion  20 reps   UBE 6 minutes  Level 1   Wall ladder 5 reps to tolerance   Theraband rows Blue t-band  20 reps   Theraband pulls Blue t-band  20 reps   Theraband internal rotation Blue t-band  15 reps  R UE   Theraband external rotation Blue t-band  20reps  R UE   Bent over rows 12 pounds  R UE  15 reps   Bicep curls 12 pounds  R UE  15 reps   Tricep extension 10 pounds  R UE  15 reps   Flexion Supine with wand  15 reps   External rotation Supine with wand  15 reps      MANUAL THERAPY: (10 minutes): Joint mobilization, Soft tissue mobilization and PROM to R UE was utilized and necessary because of the patient's restricted joint motion, loss of articular motion and restricted motion of soft tissue.       Treatment/Session Summary:    ?? Response to Treatment:  Patient tolerated treatment well with improving overall mobility.    ?? Communication/Consultation:  None today  ?? Equipment provided today:  None today  ?? Recommendations/Intent for next treatment session: Next visit will focus on improving overall mobility and strength in R UE with daily activities.    Total Treatment Billable Duration:  40 minutes   PT Patient Time In/Time Out  Time In: 1430  Time Out: 1515  Allene Dillon, PT    Future Appointments   Date Time Provider Department Center   12/02/2019  1:45 PM Blanchard Mane, PTA [facility] SFE   12/09/2019  1:45 PM Allene Dillon, PT [facility] S[facility]   12/16/2019  1:45 PM Blanchard Mane, PTA [facility] S[facility]   12/23/2019  1:45 PM Ernesta Trabert, Gregery Na, PT [facility] S[facility]

## 2019-12-02 ENCOUNTER — Encounter: Payer: BLUE CROSS/BLUE SHIELD | Primary: Family Medicine

## 2019-12-09 ENCOUNTER — Inpatient Hospital Stay: Admit: 2019-12-09 | Payer: BLUE CROSS/BLUE SHIELD | Primary: Family Medicine

## 2019-12-09 DIAGNOSIS — M25511 Pain in right shoulder: Secondary | ICD-10-CM

## 2019-12-09 NOTE — Progress Notes (Signed)
 Progress Notes by [name], PT at 12/09/19 [phone]                Author: [name], PT  Service: Physical Therapy  Author Type: Physical Therapist       Filed: 12/09/19 1339  Date of Service: 12/09/19 0746  Status: Signed          Editor: [name], PT (Physical Therapist)               Ozell LITTIE Rea   DOB: 06/09/1956   Primary: [name] Essentials*   Secondary:   [facility]   [address], [place] [address]   Phone:[phone]   Fax:[phone]                 OUTPATIENT PHYSICAL THERAPY: Daily Treatment Note 12/09/2019   Visit Count:  8       ICD-10: Treatment Diagnosis:      Pain in right shoulder (M25.511)     Stiffness of right shoulder, not elsewhere classified (M25.611)   Precautions/Allergies:    Nsaids (non-steroidal anti-inflammatory drug)    TREATMENT PLAN:   Effective Dates: 10/08/2019 TO 01/06/2020 (90 days).  Frequency/Duration : 2 times a week for 90 Day(s)      Pre-treatment Symptoms/Complaints:  12/09/2019 : Patient reports he is doing well.  He reports that his shoulder is just sore.   Pain:  Initial: Pain Intensity 1: 2   Pain Location 1: Shoulder   Pain Orientation 1: Right   Pain Intervention(s) 1: Rest, Medication (see MAR)   Post Session:  2/10        Medications Last Reviewed:  12/09/2019   Updated Objective Findings:   None Today   TREATMENT:           THERAPEUTIC EXERCISE: (30 minutes):  Exercises per grid below to improve mobility and strength.  Required minimal visual, verbal and manual cues to promote proper body alignment, promote proper  body posture and promote proper body mechanics.  Progressed resistance, range, repetitions and complexity of movement as indicated.     Date:   12/09/2019      Activity/Exercise  Parameters      Pulleys  To tolerance in flexion   20 reps      UBE  6 minutes   Level 1      Wall ladder  5 reps to tolerance      Theraband rows  Blue t-band   20 reps      Theraband  pulls  Blue t-band   20 reps      Theraband internal rotation  Blue t-band   15 reps   R UE      Theraband external rotation  Blue t-band   20reps   R UE      Bent over rows  12 pounds   R UE   15 reps      Bicep curls  12 pounds   R UE   15 reps      Tricep extension  10 pounds   R UE   15 reps      Flexion  Supine with wand   15 reps      External rotation  Supine with wand   15 reps           MANUAL THERAPY: (10 minutes): Joint mobilization, Soft tissue mobilization and PROM to R UE was utilized and necessary because  of the patient's restricted joint motion, loss of articular motion  and restricted motion of soft tissue.         Treatment/Session Summary:       Response to Treatment:  Patient tolerated treatment well with improving overall  mobility.       Communication/Consultation:   None today     Equipment provided today:   None today     Recommendations/Intent for next treatment session: Next visit will focus on improving overall mobility and strength in R UE with daily activities.      Total Treatment Billable Duration:  40 minutes    PT Patient Time In/Time Out   Time In: 1300   Time Out: 1345   [name], PT      Future Appointments      Date  Time  Provider  Department  Center      12/09/2019   1:00 PM  [name], [place]  [facility]  SFE      12/16/2019   1:45 PM  [name], PTA  [facility]  SFE      12/23/2019   1:45 PM  Sams, Delon LOISE, PT  [facility]  SFE

## 2019-12-09 NOTE — Discharge Instructions (Signed)
Therapy Discharge by [name], PT at 12/09/19 1300                Author: [name], PT  Service: Physical Therapy  Author Type: Physical Therapist       Filed: 01/08/20 1427  Date of Service: 12/09/19 1300  Status: Signed          Editor: [name], PT (Physical Therapist)                  Oletta Lamas   DOB: Apr 08, 1956   Primary: [facility] Essentials*   Secondary:   [facility]   [address], [place] [address], [place]   Phone:[phone]   [phone]                      OUTPATIENT PHYSICAL THERAPY:Discontinuation Summary          ICD-10: Treatment Diagnosis:      Pain in right shoulder (M25.511)     Stiffness of right shoulder, not elsewhere classified (M25.611)   Precautions/Allergies:    Nsaids (non-steroidal anti-inflammatory drug)    TREATMENT PLAN:   Effective Dates: 10/08/2019 TO 01/06/2020 (90 days).  Frequency/Duration : 2 times a week for 90 Day(s)  MEDICAL/REFERRING DIAGNOSIS:   Impingement syndrome of right shoulder [M75.41]   Pain in right shoulder [M25.511]    DATE OF ONSET: Surgery: September 30, 2019   REFERRING PHYSICIAN: Bonita Quin, MD   MD Orders: Evaluate and Treat   Return MD Appointment: January 2021          ASSESSMENT:  Mr. Hallums  presented with improving mobility, strength, and pain in right shoulder secondary to recent RCR of R UE.  Patient attended a total of 8 scheduled  physical therapy visits including initial evaluation on 10/08/2019.  Treatment consisted of strengthening and mobility activities to improve overall functional mobility and performance with activities of daily living.  Patient did not attend any additional  physical therapy visits secondary to feeling better.  Patient's therapy will be discontinued at this time.  We will be happy to re-assess him  with a change in his status and a new order from his  doctor.  Thank you for the opportunity to serve this patient.              GOALS: (Goals have been discussed and agreed upon with patient.)   Short-Term Functional Goals: Time Frame: 30 days   1.  Patient will be independent with home exercise program without exacerbation of symptoms or cueing needed--goal met.    2.  Patient will be independent with correct sleeping positions and awareness/avoidance of aggravating positions without cueing needed--goal met.    Discharge Goals: Time Frame: 90 days   1.  Patient will be independent with all ADLs with minimal onset of right  shoulder pain and no deficits with daily tasks--goal met.   2.  Patient will report no fear avoidance with social or recreational activities due to right shoulder pain --goal met.   3.  Patient will score less than or equal to 12/55 on Quick DASH with minimal  effect of right shoulder pain on patient's ability to manage every day life activities--goal unmet.      OUTCOME MEASURE:    Tool Used: Disabilities of the Arm, Shoulder and Hand (DASH)  Questionnaire - Quick Version   Score:   Initial: 21/55    Most  Recent: 14/55 (Date: 11/11/2019 )        Interpretation of Score: The DASH is designed to measure the activities  of daily living in person's with upper extremity dysfunction or pain.  Each section is scored on a 1-5 scale, 5 representing the greatest disability.  The scores of each section are added together for a total score of 55.             EXAMINATION:       Observation/Orthostatic Postural Assessment:           Posture Assessment: Rounded shoulders, Forward head          Bruising noted along anterior upper right arm.  Steri-strips noted along incision sites.   Palpation:           Mild tenderness to palpation noted along area of bruising post surgery.     ROM:           R UE Assessment (PROM):     Shoulder Flexion: 110 degrees     Shoulder Extension: 20 degrees     Shoulder Abduction: 70 degrees     Shoulder Adduction: 0 degrees     Shoulder Internal rotation: 70 degrees with UE at 0 degrees abduction      Shoulder External rotation: 50 degrees with UE at 0 degrees abduction     Elbow Flexion: 110 degrees     Elbow Extension: 0 degrees   Strength:           R UE Assessment (Strength):     Shoulder Flexion: 3/5 with manual muscle testing     Shoulder Extension: 3/5 with manual muscle testing     Shoulder Abduction: 3/5 with manual muscle testing     Shoulder Adduction: 3/5 with manual muscle testing     Shoulder Internal rotation: 3/5 with manual muscle testing     Shoulder External rotation: 3/5 with manual muscle testing     Elbow Flexion: 3+/5 with manual muscle testing     Elbow Extension: 3+/5 with manual muscle testing      Special Tests:           Negative   Neurological Screen:         Dermatomes:  Within normal limits         Reflexes:  2+   Functional Mobility:    Gait/Mobility:          Independent              Transfers:         Sit to Stand: Independent     Stand to Sit: Independent     Stand Pivot Transfers: Independent     Bed to Chair: Independent     Lateral Transfers: Independent              Bed Mobility:         Rolling: Independent     Supine to Sit: Independent     Sit to Supine: Independent     Scooting: Independent

## 2019-12-09 NOTE — Other (Signed)
Steven [name]  DOB: 05-Nov-1955  Primary: [facility]*  Secondary:  Therapy Center at [facility]  [address], [place]  Phone:[phone]   [phone]          OUTPATIENT PHYSICAL THERAPY:Discontinuation Summary    ICD-10: Treatment Diagnosis:   ?? Pain in right shoulder (M25.511)  ?? Stiffness of right shoulder, not elsewhere classified (M25.611)  Precautions/Allergies:   Nsaids (non-steroidal anti-inflammatory drug)   TREATMENT PLAN:  Effective Dates: 10/08/2019 TO 01/06/2020 (90 days).  Frequency/Duration: 2 times a week for 90 Day(s) MEDICAL/REFERRING DIAGNOSIS:  Impingement syndrome of right shoulder [M75.41]  Pain in right shoulder [M25.511]   DATE OF ONSET: Surgery: September 30, 2019  REFERRING PHYSICIAN: Bonita Quin, MD  MD Orders: Evaluate and Treat  Return MD Appointment: January 2021     ASSESSMENT:  Steven [name] presented with improving mobility, strength, and pain in right shoulder secondary to recent RCR of R UE.  Patient attended a total of 8 scheduled physical therapy visits including initial evaluation on 10/08/2019.  Treatment consisted of strengthening and mobility activities to improve overall functional mobility and performance with activities of daily living.  Patient did not attend any additional physical therapy visits secondary to feeling better.  Patient's therapy will be discontinued at this time.  We will be happy to re-assess him with a change in his status and a new order from his doctor.  Thank you for the opportunity to serve this patient.        GOALS: (Goals have been discussed and agreed upon with patient.)  Short-Term Functional Goals: Time Frame: 30 days  1. Patient will be independent with home exercise program without exacerbation of symptoms or cueing needed--goal met.   2. Patient will be independent with correct sleeping positions and awareness/avoidance of aggravating positions without cueing needed--goal  met.   Discharge Goals: Time Frame: 90 days  1. Patient will be independent with all ADLs with minimal onset of right shoulder pain and no deficits with daily tasks--goal met.  2. Patient will report no fear avoidance with social or recreational activities due to right shoulder pain --goal met.  3. Patient will score less than or equal to 12/55 on Quick DASH with minimal effect of right shoulder pain on patient's ability to manage every day life activities--goal unmet.    OUTCOME MEASURE:   Tool Used: Disabilities of the Arm, Shoulder and Hand (DASH) Questionnaire - Quick Version  Score:  Initial: 21/55  Most Recent: 14/55 (Date: 11/11/2019 )   Interpretation of Score: The DASH is designed to measure the activities of daily living in person's with upper extremity dysfunction or pain.  Each section is scored on a 1-5 scale, 5 representing the greatest disability.  The scores of each section are added together for a total score of 55.       EXAMINATION:   Observation/Orthostatic Postural Assessment:          Posture Assessment: Rounded shoulders, Forward head         Bruising noted along anterior upper right arm.  Steri-strips noted along incision sites.  Palpation:          Mild tenderness to palpation noted along area of bruising post surgery.    ROM:          R UE Assessment (PROM):  ?? Shoulder Flexion: 110 degrees  ?? Shoulder Extension: 20 degrees  ?? Shoulder Abduction: 70 degrees  ?? Shoulder Adduction:  0 degrees  ?? Shoulder Internal rotation: 70 degrees with UE at 0 degrees abduction  ?? Shoulder External rotation: 50 degrees with UE at 0 degrees abduction  ?? Elbow Flexion: 110 degrees  ?? Elbow Extension: 0 degrees  Strength:          R UE Assessment (Strength):  ?? Shoulder Flexion: 3/5 with manual muscle testing  ?? Shoulder Extension: 3/5 with manual muscle testing  ?? Shoulder Abduction: 3/5 with manual muscle testing  ?? Shoulder Adduction: 3/5 with manual muscle testing  ?? Shoulder Internal rotation: 3/5 with  manual muscle testing  ?? Shoulder External rotation: 3/5 with manual muscle testing  ?? Elbow Flexion: 3+/5 with manual muscle testing  ?? Elbow Extension: 3+/5 with manual muscle testing    Special Tests:          Negative  Neurological Screen:        Dermatomes:  Within normal limits        Reflexes:  2+  Functional Mobility:   Gait/Mobility:    ??   Independent         Transfers:     ?? Sit to Stand: Independent  ?? Stand to Sit: Independent  ?? Stand Pivot Transfers: Independent  ?? Bed to Chair: Independent  ?? Lateral Transfers: Independent         Bed Mobility:     ?? Rolling: Independent  ?? Supine to Sit: Independent  ?? Sit to Supine: Independent  ?? Scooting: Independent

## 2019-12-09 NOTE — Progress Notes (Signed)
Steven [name]  DOB: Nov 28, 1955  Primary: [facility]*  Secondary:  Therapy Center at [facility]  [address], [place] [address]  Phone:[phone]   [phone]        OUTPATIENT PHYSICAL THERAPY: Daily Treatment Note 12/09/2019  Visit Count:  8    ICD-10: Treatment Diagnosis:   ?? Pain in right shoulder (M25.511)  ?? Stiffness of right shoulder, not elsewhere classified (M25.611)  Precautions/Allergies:   Nsaids (non-steroidal anti-inflammatory drug)   TREATMENT PLAN:  Effective Dates: 10/08/2019 TO 01/06/2020 (90 days).  Frequency/Duration: 2 times a week for 90 Day(s)    Pre-treatment Symptoms/Complaints:  12/09/2019: Patient reports he is doing well.  He reports that his shoulder is just sore.  Pain: Initial: Pain Intensity 1: 2  Pain Location 1: Shoulder  Pain Orientation 1: Right  Pain Intervention(s) 1: Rest, Medication (see MAR)  Post Session:  2/10   Medications Last Reviewed:  12/09/2019  Updated Objective Findings:  None Today  TREATMENT:     THERAPEUTIC EXERCISE: (30 minutes):  Exercises per grid below to improve mobility and strength.  Required minimal visual, verbal and manual cues to promote proper body alignment, promote proper body posture and promote proper body mechanics.  Progressed resistance, range, repetitions and complexity of movement as indicated.   Date:  12/09/2019   Activity/Exercise Parameters   Pulleys To tolerance in flexion  20 reps   UBE 6 minutes  Level 1   Wall ladder 5 reps to tolerance   Theraband rows Blue t-band  20 reps   Theraband pulls Blue t-band  20 reps   Theraband internal rotation Blue t-band  15 reps  R UE   Theraband external rotation Blue t-band  20reps  R UE   Bent over rows 12 pounds  R UE  15 reps   Bicep curls 12 pounds  R UE  15 reps   Tricep extension 10 pounds  R UE  15 reps   Flexion Supine with wand  15 reps   External rotation Supine with wand  15 reps      MANUAL THERAPY: (10 minutes): Joint mobilization, Soft tissue mobilization and PROM to R UE was utilized and necessary because of the patient's restricted joint motion, loss of articular motion and restricted motion of soft tissue.       Treatment/Session Summary:    ?? Response to Treatment:  Patient tolerated treatment well with improving overall mobility.    ?? Communication/Consultation:  None today  ?? Equipment provided today:  None today  ?? Recommendations/Intent for next treatment session: Next visit will focus on improving overall mobility and strength in R UE with daily activities.    Total Treatment Billable Duration:  40 minutes   PT Patient Time In/Time Out  Time In: 1300  Time Out: 1345  Allene Dillon, PT    Future Appointments   Date Time Provider Department Center   12/09/2019  1:00 PM Allene Dillon,  [facility] SFE   12/16/2019  1:45 PM Blanchard Mane, PTA [facility] SFE   12/23/2019  1:45 PM Artemio Dobie, Gregery Na, PT [facility] SFE

## 2019-12-16 ENCOUNTER — Inpatient Hospital Stay: Admit: 2019-12-16 | Payer: BLUE CROSS/BLUE SHIELD | Primary: Family Medicine

## 2019-12-16 NOTE — Progress Notes (Signed)
 Progress Notes by Steven [name], PTA at 12/16/19 1350                Author: Christin Steven [name], PTA  Service: --  Author Type: Physical Therapy Assistant       Filed: 12/16/19 1449  Date of Service: 12/16/19 1350  Status: Signed          Editor: Steven [name], PTA (Physical Therapy Assistant)               Steven [name]   DOB: [birth year]   Primary: Steven [name] Essentials*   Secondary:   Therapy Center at [facility]   [address], [address], [place] [address]   [phone]   Fax:[phone]                 OUTPATIENT PHYSICAL THERAPY: Daily Treatment Note 12/16/2019   Visit Count:  9       ICD-10: Treatment Diagnosis:      Pain in right shoulder (M25.511)     Stiffness of right shoulder, not elsewhere classified (M25.611)   Precautions/Allergies:    Nsaids (non-steroidal anti-inflammatory drug)    TREATMENT PLAN:   Effective Dates: 10/08/2019 TO 01/06/2020 (90 days).  Frequency/Duration : 2 times a week for 90 Day(s)      Pre-treatment Symptoms/Complaints:  12/16/2019 : Patient reports he is doing well.     Pain:  Initial:    2  Post Session:  2/10        Medications Last Reviewed:  12/16/2019   Updated Objective Findings:   None Today   TREATMENT:           THERAPEUTIC EXERCISE: (30 minutes):  Exercises per grid below to improve mobility and strength.  Required minimal visual, verbal and manual cues to promote proper body alignment, promote proper  body posture and promote proper body mechanics.  Progressed resistance, range, repetitions and complexity of movement as indicated.     Date:   2/17  /2021      Activity/Exercise  Parameters      Pulleys  To tolerance in flexion   20 reps      UBE  6 minutes   Level 1      Wall ladder  5 reps to tolerance      Theraband rows  Blue t-band   20 reps      Theraband pulls  Blue t-band   20 reps      Theraband internal rotation  Blue t-band   15 reps   R UE      Theraband external rotation  Blue t-band   20reps   R UE      Bent  over rows  12 pounds   R UE   15 reps      Bicep curls  12 pounds   R UE   15 reps      Tricep extension  10 pounds   R UE   15 reps      Flexion  Supine with wand   15 reps      External rotation  Supine with wand   15 reps           MANUAL THERAPY: (10 minutes): Joint mobilization, Soft tissue mobilization and PROM to R UE was utilized and necessary because of the patient's restricted joint motion, loss of articular motion  and restricted motion of soft tissue.         Treatment/Session  Summary:       Response to Treatment:  Patient tolerated treatment well with improving overall  mobility.  End range tightness.     Communication/Consultation:   None today     Equipment provided today:   None today     Recommendations/Intent for next treatment session: Next visit will focus on improving overall mobility and strength in R UE with daily activities.      Total Treatment Billable Duration:  40 minutes    PT Patient Time In/Time Out   Time In: 1345   Time Out: 1430   Steven [name], [place]      Future Appointments      Date  Time  Provider  Department  Center      12/23/2019   7:00 PM  [name], [name]  [facility]  SFE      12/30/2019   1:45 PM  Sams, [name], PT  [facility]  SFE

## 2019-12-16 NOTE — Progress Notes (Signed)
Steven [name]  DOB: 1956/07/12  Primary: [facility]*  Secondary:  Therapy Center at [facility]  [address], [place] [address]  Phone:[phone]   Fax:[phone]        OUTPATIENT PHYSICAL THERAPY: Daily Treatment Note 12/16/2019  Visit Count:  9    ICD-10: Treatment Diagnosis:   ?? Pain in right shoulder (M25.511)  ?? Stiffness of right shoulder, not elsewhere classified (M25.611)  Precautions/Allergies:   Nsaids (non-steroidal anti-inflammatory drug)   TREATMENT PLAN:  Effective Dates: 10/08/2019 TO 01/06/2020 (90 days).  Frequency/Duration: 2 times a week for 90 Day(s)    Pre-treatment Symptoms/Complaints:  12/16/2019: Patient reports he is doing well.    Pain: Initial:   2 Post Session:  2/10   Medications Last Reviewed:  12/16/2019  Updated Objective Findings:  None Today  TREATMENT:     THERAPEUTIC EXERCISE: (30 minutes):  Exercises per grid below to improve mobility and strength.  Required minimal visual, verbal and manual cues to promote proper body alignment, promote proper body posture and promote proper body mechanics.  Progressed resistance, range, repetitions and complexity of movement as indicated.   Date:  2/17  /2021   Activity/Exercise Parameters   Pulleys To tolerance in flexion  20 reps   UBE 6 minutes  Level 1   Wall ladder 5 reps to tolerance   Theraband rows Blue t-band  20 reps   Theraband pulls Blue t-band  20 reps   Theraband internal rotation Blue t-band  15 reps  R UE   Theraband external rotation Blue t-band  20reps  R UE   Bent over rows 12 pounds  R UE  15 reps   Bicep curls 12 pounds  R UE  15 reps   Tricep extension 10 pounds  R UE  15 reps   Flexion Supine with wand  15 reps   External rotation Supine with wand  15 reps      MANUAL THERAPY: (10 minutes): Joint mobilization, Soft tissue mobilization and PROM to R UE was utilized and necessary because of the patient's restricted joint motion, loss of articular motion and restricted motion of soft tissue.       Treatment/Session Summary:    ?? Response to Treatment:  Patient tolerated treatment well with improving overall mobility.  End range tightness.  ?? Communication/Consultation:  None today  ?? Equipment provided today:  None today  ?? Recommendations/Intent for next treatment session: Next visit will focus on improving overall mobility and strength in R UE with daily activities.    Total Treatment Billable Duration:  40 minutes   PT Patient Time In/Time Out  Time In: 1345  Time Out: 1430  Blanchard Mane, [place]    Future Appointments   Date Time Provider Department Center   12/23/2019  7:00 PM Allene Dillon, [facility] S[facility]   12/30/2019  1:45 PM Sams, Gregery Na, PT [facility] SFE

## 2019-12-17 ENCOUNTER — Ambulatory Visit: Payer: No Typology Code available for payment source

## 2019-12-18 ENCOUNTER — Encounter: Payer: Self-pay | Admitting: Nurse Practitioner

## 2019-12-18 ENCOUNTER — Other Ambulatory Visit: Payer: Self-pay

## 2019-12-18 ENCOUNTER — Ambulatory Visit (INDEPENDENT_AMBULATORY_CARE_PROVIDER_SITE_OTHER): Payer: No Typology Code available for payment source | Admitting: Nurse Practitioner

## 2019-12-18 VITALS — BP 106/57 | HR 90 | Temp 98.8°F | Ht 70.5 in | Wt 322.2 lb

## 2019-12-18 DIAGNOSIS — M5442 Lumbago with sciatica, left side: Secondary | ICD-10-CM

## 2019-12-18 DIAGNOSIS — I10 Essential (primary) hypertension: Secondary | ICD-10-CM | POA: Diagnosis not present

## 2019-12-18 DIAGNOSIS — J3081 Allergic rhinitis due to animal (cat) (dog) hair and dander: Secondary | ICD-10-CM | POA: Diagnosis not present

## 2019-12-18 DIAGNOSIS — G4733 Obstructive sleep apnea (adult) (pediatric): Secondary | ICD-10-CM

## 2019-12-18 DIAGNOSIS — Z0000 Encounter for general adult medical examination without abnormal findings: Secondary | ICD-10-CM

## 2019-12-18 DIAGNOSIS — J4520 Mild intermittent asthma, uncomplicated: Secondary | ICD-10-CM | POA: Diagnosis not present

## 2019-12-18 DIAGNOSIS — K219 Gastro-esophageal reflux disease without esophagitis: Secondary | ICD-10-CM

## 2019-12-18 DIAGNOSIS — Z125 Encounter for screening for malignant neoplasm of prostate: Secondary | ICD-10-CM

## 2019-12-18 DIAGNOSIS — G8929 Other chronic pain: Secondary | ICD-10-CM

## 2019-12-18 DIAGNOSIS — E559 Vitamin D deficiency, unspecified: Secondary | ICD-10-CM

## 2019-12-18 MED ORDER — MONTELUKAST SODIUM 10 MG PO TABS: 10.0000 mg | ORAL_TABLET | Freq: Every day | ORAL | 3 refills | Status: DC

## 2019-12-18 MED ORDER — PANTOPRAZOLE SODIUM 40 MG PO TBEC: 40.0000 mg | DELAYED_RELEASE_TABLET | Freq: Every day | ORAL | 3 refills | Status: DC

## 2019-12-18 MED ORDER — LISINOPRIL-HYDROCHLOROTHIAZIDE 10-12.5 MG PO TABS: 1.0000 | ORAL_TABLET | Freq: Every day | ORAL | 3 refills | Status: DC

## 2019-12-18 MED ORDER — GABAPENTIN 300 MG PO CAPS: 300.0000 mg | ORAL_CAPSULE | Freq: Every day | ORAL | 3 refills | Status: DC

## 2019-12-18 NOTE — Assessment & Plan Note (Signed)
Ongoing issue, was recommended to have back surgery but due to weight and fear of anesthesia he is hesitant.  Continue Biofreeze and Tylenol as needed.  He is not wishing to take much pain medication.  Will trial Gabapentin at bedtime, 300 MG script sent, adjust as needed.  This may benefit nerve pain, could also consider Duloxetine in future.  Will consider PT if ongoing discomfort. Return in 6 weeks.

## 2019-12-18 NOTE — Assessment & Plan Note (Signed)
Chronic, stable.  No recent exacerbations.  Appears very allergy driven, especially to animals.  Continue current medication regimen and adjust as needed, minimal use at this time of Albuterol.  Continue collaboration with pulmonary and review notes.  Is on ACE, if cough or refractory symptoms present then will determine alternate option for BP, such as Amlodipine.

## 2019-12-18 NOTE — Assessment & Plan Note (Signed)
Chronic, stable with BPs on home readings and in office at goal.  Continue current medication regimen and adjust as needed.  Refills sent.  Continue to monitor BP at home regularly.  CMP and TSH today.

## 2019-12-18 NOTE — Assessment & Plan Note (Signed)
Chronic, stable.  Continue current medication regimen and adjust as needed.  Consider referral to GI if worsening symptoms.  Reports history of multiple EGD.  Is on Pepcid and PPI, this is long standing and also benefits his asthma, as in past reports GERD symptoms exacerbated asthma symptoms.

## 2019-12-18 NOTE — Progress Notes (Signed)
BP (!) 106/57   Pulse 90   Temp 98.8 F (37.1 C) (Oral)   Ht 5' 10.5" (1.791 m)   Wt (!) 322 lb 3.2 oz (146.1 kg)   SpO2 97%   BMI 45.58 kg/m    Subjective:    Patient ID: Jose Liu, male    DOB: [birth year], 64 y.o.   MRN: [id number]  HPI: Jose Liu is a 64 y.o. male presenting on 12/18/2019 for comprehensive medical examination. Current medical complaints include:none  He currently lives with: wife Interim Problems from his last visit: no   HYPERTENSION Currently taking Losartan-HCTZ 10-12.5 MG daily. 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  Has OSA and uses CPAP 100% of the time. Hypertension status: stable  Satisfied with current treatment? yes Duration of hypertension: chronic BP monitoring frequency:  daily BP range: 120/70's on average BP medication side effects:  no Medication compliance: good compliance Previous BP meds:Losartan-HCTZ Aspirin: no Recurrent headaches: no Visual changes: no Palpitations: no Dyspnea: no Chest pain: no Lower extremity edema: no Dizzy/lightheaded: no   GERD Continues on Protonix 40 MG daily and Famotidine 40 MG daily.  States his appendix and bowels are situated differently.  Had a colonoscopy last a couple years ago. GERD control status: stable  Satisfied with current treatment? yes Heartburn frequency: none Medication side effects: no  Medication compliance: stable Previous GERD medications: Antacid use frequency:  none Dysphagia: no Odynophagia:  no Hematemesis: no Blood in stool: no EGD: no   ASTHMA/ALLERGIC RHINITIS Takes Flonase nasal spray and Singulair 10 MG daily.  States he has narrow sinuses. Reports that Zpack does not work for him when he gets sinusitis, which he gets frequently.  Established with pulmonary on 11/12/2019, Dr. Lamonte Sakai, with plan to reinitiate Xolair.  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 Asthma status: stable Satisfied with current treatment?: yes Albuterol/rescue inhaler frequency: once a month use,  if around cats Dyspnea frequency: rarely Wheezing frequency: rarely Cough frequency: none Nocturnal symptom frequency: none Limitation of activity: no Current upper respiratory symptoms: no Triggers: allergies, cats or dogs Home peak flows: none Last Spirometry: unknown Failed/intolerant to following asthma meds: none Asthma meds in past: Albuterol Aerochamber/spacer use: no Visits to ER or Urgent Care in past year: no Pneumovax: refuses Influenza: refuses, make him sick   BACK PAIN & SHOULDER PAIN States he is supposed to have back surgery, his "L4-L5 are crushed". Has radiculopathy, left leg.  Has arthritis and bursitis in hips.  Has been told he has shoulder issues too, with impingement due to arthritis.  Is fearful of surgery for back, which has been recommended, due to past experiences with surgery and not receiving enough anesthesia.  Does not stay under well.  Also has impingement history of shoulders, which is currently causing numbness and pain in hands R>L.  History of working on railroad, multiple areas of arthritis, has "two bad discs in neck" too.   Duration: chronic Mechanism of injury: unknown Location: bilateral and low back Onset: gradual Severity: mild Quality: dull and aching Frequency: intermittent Radiation: L leg below the knee Aggravating factors: lifting, movement, walking and bending Alleviating factors: APAP Status: stable Treatments attempted: APAP  Relief with NSAIDs?: No NSAIDs Taken Nighttime pain:  no Paresthesias / decreased sensation:  no Bowel / bladder incontinence:  no Fevers:  no Dysuria / urinary frequency:  no   Functional Status Survey: Is the patient deaf or have difficulty hearing?: No Does the patient have difficulty seeing, even when wearing glasses/contacts?: No Does the  patient have difficulty concentrating, remembering, or making decisions?: No Does the patient have difficulty walking or climbing stairs?: Yes Does the  patient have difficulty dressing or bathing?: No Does the patient have difficulty doing errands alone such as visiting a doctor's office or shopping?: No  FALL RISK: Fall Risk  12/18/2019  Falls in the past year? 0  Number falls in past yr: 0  Injury with Fall? 0    Depression Screen Depression screen [facility] 2/9 12/18/2019 11/12/2019  Decreased Interest 0 0  Down, Depressed, Hopeless 0 0  PHQ - 2 Score 0 0  Altered sleeping 0 -  Tired, decreased energy 0 -  Change in appetite 0 -  Feeling bad or failure about yourself  0 -  Trouble concentrating 0 -  Moving slowly or fidgety/restless 0 -  Suicidal thoughts 0 -  PHQ-9 Score 0 -  Difficult doing work/chores Not difficult at all -    Advanced Directives <no information>  Past Medical History:  Past Medical History:  Diagnosis Date  . Allergies   . Asthma   . GERD (gastroesophageal reflux disease)   . Hypertension   . Situs inversus     Surgical History:  Past Surgical History:  Procedure Laterality Date  . CARDIAC ELECTROPHYSIOLOGY STUDY AND ABLATION    . EYE SURGERY      Medications:  Current Outpatient Medications on File Prior to Visit  Medication Sig  . albuterol (VENTOLIN HFA) 108 (90 Base) MCG/ACT inhaler Inhale 1 puff into the lungs every 6 (six) hours as needed.  Marland Kitchen EPINEPHrine (EPIPEN 2-PAK) 0.3 mg/0.3 mL IJ SOAJ injection Inject 0.3 mg into the muscle as needed for anaphylaxis.  . famotidine (PEPCID) 40 MG tablet Take 40 mg by mouth daily.  . fluticasone (FLONASE) 50 MCG/ACT nasal spray Place 2 sprays into both nostrils daily.  Marland Kitchen UNABLE TO FIND Xolair injection monthly for allergies   Current Facility-Administered Medications on File Prior to Visit  Medication  . omalizumab Arvid Right) injection 300 mg    Allergies:  Allergies  Allergen Reactions  . Honey Bee Treatment [Bee Venom]     emesis  . Soy Allergy     cramps    Social History:  Social History   Socioeconomic History  . Marital status:  Married    Spouse name: Not on file  . Number of children: Not on file  . Years of education: Not on file  . Highest education level: Not on file  Occupational History  . Not on file  Tobacco Use  . Smoking status: Never Smoker  . Smokeless tobacco: Never Used  Substance and Sexual Activity  . Alcohol use: Yes    Comment: on rare occasion  . Drug use: Never  . Sexual activity: Not on file  Other Topics Concern  . Not on file  Social History Narrative  . Not on file   Social Determinants of Health   Financial Resource Strain: Low Risk   . Difficulty of Paying Living Expenses: Not hard at all  Food Insecurity: No Food Insecurity  . Worried About Charity fundraiser in the Last Year: Never true  . Ran Out of Food in the Last Year: Never true  Transportation Needs: No Transportation Needs  . Lack of Transportation (Medical): No  . Lack of Transportation (Non-Medical): No  Physical Activity: Inactive  . Days of Exercise per Week: 0 days  . Minutes of Exercise per Session: 0 min  Stress: No Stress Concern Present  .  Feeling of Stress : Not at all  Social Connections: Slightly Isolated  . Frequency of Communication with Friends and Family: Three times a week  . Frequency of Social Gatherings with Friends and Family: Three times a week  . Attends Religious Services: Never  . Active Member of Clubs or Organizations: Yes  . Attends Archivist Meetings: More than 4 times per year  . Marital Status: Married  Human resources officer Violence:   . Fear of Current or Ex-Partner: Not on file  . Emotionally Abused: Not on file  . Physically Abused: Not on file  . Sexually Abused: Not on file   Social History   Tobacco Use  Smoking Status Never Smoker  Smokeless Tobacco Never Used   Social History   Substance and Sexual Activity  Alcohol Use Yes   Comment: on rare occasion    Family History:  Family History  Problem Relation Age of Onset  . CAD Mother         quadruple CABG  . Stroke Mother   . Hypertension Mother   . Dementia Father   . Prostate cancer Father     Past medical history, surgical history, medications, allergies, family history and social history reviewed with patient today and changes made to appropriate areas of the chart.   Review of Systems - negative All other ROS negative except what is listed above and in the HPI.      Objective:    BP (!) 106/57   Pulse 90   Temp 98.8 F (37.1 C) (Oral)   Ht 5' 10.5" (1.791 m)   Wt (!) 322 lb 3.2 oz (146.1 kg)   SpO2 97%   BMI 45.58 kg/m   Wt Readings from Last 3 Encounters:  12/18/19 (!) 322 lb 3.2 oz (146.1 kg)  11/12/19 (!) 320 lb 3.2 oz (145.2 kg)  11/12/19 (!) 310 lb (140.6 kg)    Physical Exam Vitals and nursing note reviewed.  Constitutional:      General: He is awake. He is not in acute distress.    Appearance: He is well-developed. He is morbidly obese. He is not ill-appearing.  HENT:     Head: Normocephalic and atraumatic.     Right Ear: Hearing, tympanic membrane, ear canal and external ear normal. No drainage.     Left Ear: Hearing, tympanic membrane, ear canal and external ear normal. No drainage.     Nose: Nose normal.     Mouth/Throat:     Pharynx: Uvula midline.  Eyes:     General: Lids are normal.        Right eye: No discharge.        Left eye: No discharge.     Extraocular Movements: Extraocular movements intact.     Conjunctiva/sclera: Conjunctivae normal.     Pupils: Pupils are equal, round, and reactive to light.     Visual Fields: Right eye visual fields normal and left eye visual fields normal.  Neck:     Thyroid: No thyromegaly.     Vascular: No carotid bruit or JVD.     Trachea: Trachea normal.  Cardiovascular:     Rate and Rhythm: Normal rate and regular rhythm.     Heart sounds: Normal heart sounds, S1 normal and S2 normal. No murmur. No gallop.   Pulmonary:     Effort: Pulmonary effort is normal. No accessory muscle usage or  respiratory distress.     Breath sounds: Normal breath sounds.  Abdominal:  General: Bowel sounds are normal.     Palpations: Abdomen is soft. There is no hepatomegaly or splenomegaly.     Tenderness: There is no abdominal tenderness.  Musculoskeletal:        General: Normal range of motion.     Cervical back: Normal range of motion and neck supple.     Right lower leg: No edema.     Left lower leg: No edema.  Lymphadenopathy:     Head:     Right side of head: No submental, submandibular, tonsillar, preauricular or posterior auricular adenopathy.     Left side of head: No submental, submandibular, tonsillar, preauricular or posterior auricular adenopathy.     Cervical: No cervical adenopathy.  Skin:    General: Skin is warm and dry.     Capillary Refill: Capillary refill takes less than 2 seconds.     Findings: No rash.  Neurological:     Mental Status: He is alert and oriented to person, place, and time.     Cranial Nerves: Cranial nerves are intact.     Gait: Gait is intact.     Deep Tendon Reflexes: Reflexes are normal and symmetric.     Reflex Scores:      Brachioradialis reflexes are 2+ on the right side and 2+ on the left side.      Patellar reflexes are 2+ on the right side and 2+ on the left side. Psychiatric:        Attention and Perception: Attention normal.        Mood and Affect: Mood normal.        Speech: Speech normal.        Behavior: Behavior normal. Behavior is cooperative.        Thought Content: Thought content normal.        Cognition and Memory: Cognition normal.        Judgment: Judgment normal.     Results for orders placed or performed in visit on 11/12/19  IgE  Result Value Ref Range   IgE (Immunoglobulin E), Serum 182 (H) <OR=114 kU/L  CBC w/Diff  Result Value Ref Range   WBC 8.3 4.0 - 10.5 K/uL   RBC 5.16 4.22 - 5.81 Mil/uL   Hemoglobin 15.6 13.0 - 17.0 g/dL   HCT 47.5 39.0 - 52.0 %   MCV 92.1 78.0 - 100.0 fl   MCHC 32.9 30.0 - 36.0  g/dL   RDW 13.4 11.5 - 15.5 %   Platelets 252.0 150.0 - 400.0 K/uL   Neutrophils Relative % 66.6 43.0 - 77.0 %   Lymphocytes Relative 17.9 12.0 - 46.0 %   Monocytes Relative 11.4 3.0 - 12.0 %   Eosinophils Relative 2.7 0.0 - 5.0 %   Basophils Relative 1.4 0.0 - 3.0 %   Neutro Abs 5.6 1.4 - 7.7 K/uL   Lymphs Abs 1.5 0.7 - 4.0 K/uL   Monocytes Absolute 1.0 0.1 - 1.0 K/uL   Eosinophils Absolute 0.2 0.0 - 0.7 K/uL   Basophils Absolute 0.1 0.0 - 0.1 K/uL  CBC with Differential/Platelet  Result Value Ref Range   WBC CANCELED       Assessment & Plan:   Problem List Items Addressed This Visit      Cardiovascular and Mediastinum   Essential hypertension    Chronic, stable with BPs on home readings and in office at goal.  Continue current medication regimen and adjust as needed.  Refills sent.  Continue to monitor BP at home regularly.  CMP and TSH today.        Relevant Medications   EPINEPHrine (EPIPEN 2-PAK) 0.3 mg/0.3 mL IJ SOAJ injection   lisinopril-hydrochlorothiazide (ZESTORETIC) 10-12.5 MG tablet   Other Relevant Orders   Comprehensive metabolic panel   Lipid Panel w/o Chol/HDL Ratio   TSH     Respiratory   Asthma    Chronic, stable.  No recent exacerbations.  Appears very allergy driven, especially to animals.  Continue current medication regimen and adjust as needed, minimal use at this time of Albuterol.  Continue collaboration with pulmonary and review notes.  Is on ACE, if cough or refractory symptoms present then will determine alternate option for BP, such as Amlodipine.        Relevant Medications   montelukast (SINGULAIR) 10 MG tablet   Other Relevant Orders   CBC with Differential/Platelet   Magnesium   OSA (obstructive sleep apnea)    Chronic, stable with good compliance to CPAP.  Continue to utilize daily.      Allergic rhinitis    Chronic, ongoing.  Continue current medication regimen and adjust as needed.  Avoid elements that cause reactions, such as  animals. Continue collaboration with pulmonary.        Digestive   GERD (gastroesophageal reflux disease)    Chronic, stable.  Continue current medication regimen and adjust as needed.  Consider referral to GI if worsening symptoms.  Reports history of multiple EGD.  Is on Pepcid and PPI, this is long standing and also benefits his asthma, as in past reports GERD symptoms exacerbated asthma symptoms.      Relevant Medications   pantoprazole (PROTONIX) 40 MG tablet     Other   Low back pain    Ongoing issue, was recommended to have back surgery but due to weight and fear of anesthesia he is hesitant.  Continue Biofreeze and Tylenol as needed.  He is not wishing to take much pain medication.  Will trial Gabapentin at bedtime, 300 MG script sent, adjust as needed.  This may benefit nerve pain, could also consider Duloxetine in future.  Will consider PT if ongoing discomfort. Return in 6 weeks.      Morbid obesity ([place])    Recommend continued focus on healthy diet choices and regular physical activity (30 minutes 5 days a week).  Recommend small steps and goals at a time.      Relevant Orders   Lipid Panel w/o Chol/HDL Ratio   TSH    Other Visit Diagnoses    Annual physical exam    -  Primary   Annual labs obtained to include PSA, Lipid, CBC, CMP, TSH   Relevant Orders   PSA   Vitamin D deficiency       Takes supplement, history of low level.  Check today and continue or adjust supplement as needed.   Relevant Orders   VITAMIN D 25 Hydroxy (Vit-D Deficiency, Fractures)   Prostate cancer screening       Check PSA today, joint decision with patient.   Relevant Orders   PSA       Discussed aspirin prophylaxis for myocardial infarction prevention and decision was it was not indicated  LABORATORY TESTING:  Health maintenance labs ordered today as discussed above.   The natural history of prostate cancer and ongoing controversy regarding screening and potential treatment outcomes  of prostate cancer has been discussed with the patient. The meaning of a false positive PSA and a false negative PSA has been discussed.  He indicates understanding of the limitations of this screening test and wishes to proceed with screening PSA testing.   IMMUNIZATIONS:   - Tdap: Tetanus vaccination status reviewed: refused -- need past records - Influenza: Refused - Pneumovax: Not applicable - Prevnar: Not applicable - Zostavax vaccine: Refused  SCREENING: - Colonoscopy: Up to date  -- reports done 2 years ago, need past records Discussed with patient purpose of the colonoscopy is to detect colon cancer at curable precancerous or early stages   - AAA Screening: Not applicable  -Hearing Test: Not applicable  -Spirometry: Not applicable   PATIENT COUNSELING:    Sexuality: Discussed sexually transmitted diseases, partner selection, use of condoms, avoidance of unintended pregnancy  and contraceptive alternatives.   Advised to avoid cigarette smoking.  I discussed with the patient that most people either abstain from alcohol or drink within safe limits (<=14/week and <=4 drinks/occasion for males, <=7/weeks and <= 3 drinks/occasion for females) and that the risk for alcohol disorders and other health effects rises proportionally with the number of drinks per week and how often a drinker exceeds daily limits.  Discussed cessation/primary prevention of drug use and availability of treatment for abuse.   Diet: Encouraged to adjust caloric intake to maintain  or achieve ideal body weight, to reduce intake of dietary saturated fat and total fat, to limit sodium intake by avoiding high sodium foods and not adding table salt, and to maintain adequate dietary potassium and calcium preferably from fresh fruits, vegetables, and low-fat dairy products.    stressed the importance of regular exercise  Injury prevention: Discussed safety belts, safety helmets, smoke detector, smoking near bedding or  upholstery.   Dental health: Discussed importance of regular tooth brushing, flossing, and dental visits.   Follow up plan: NEXT PREVENTATIVE PHYSICAL DUE IN 1 YEAR. Return in about 6 weeks (around 01/29/2020) for Arthritis.

## 2019-12-18 NOTE — Patient Instructions (Signed)

## 2019-12-18 NOTE — Assessment & Plan Note (Signed)
Chronic, stable with good compliance to CPAP.  Continue to utilize daily. 

## 2019-12-18 NOTE — Assessment & Plan Note (Signed)
Recommend continued focus on healthy diet choices and regular physical activity (30 minutes 5 days a week).  Recommend small steps and goals at a time.

## 2019-12-18 NOTE — Assessment & Plan Note (Signed)
Chronic, ongoing.  Continue current medication regimen and adjust as needed.  Avoid elements that cause reactions, such as animals. Continue collaboration with pulmonary.

## 2019-12-19 LAB — CBC WITH DIFFERENTIAL/PLATELET
Basophils Absolute: 0.1 10*3/uL (ref 0.0–0.2)
Basos: 1 %
EOS (ABSOLUTE): 0.3 10*3/uL (ref 0.0–0.4)
Eos: 3 %
Hematocrit: 44.7 % (ref 37.5–51.0)
Hemoglobin: 15.3 g/dL (ref 13.0–17.7)
Immature Grans (Abs): 0 10*3/uL (ref 0.0–0.1)
Immature Granulocytes: 0 %
Lymphocytes Absolute: 1.9 10*3/uL (ref 0.7–3.1)
Lymphs: 21 %
MCH: 31 pg (ref 26.6–33.0)
MCHC: 34.2 g/dL (ref 31.5–35.7)
MCV: 91 fL (ref 79–97)
Monocytes Absolute: 1 10*3/uL — ABNORMAL HIGH (ref 0.1–0.9)
Monocytes: 11 %
Neutrophils Absolute: 5.9 10*3/uL (ref 1.4–7.0)
Neutrophils: 64 %
Platelets: 263 10*3/uL (ref 150–450)
RBC: 4.94 x10E6/uL (ref 4.14–5.80)
RDW: 12 % (ref 11.6–15.4)
WBC: 9.2 10*3/uL (ref 3.4–10.8)

## 2019-12-19 LAB — COMPREHENSIVE METABOLIC PANEL
ALT: 15 IU/L (ref 0–44)
AST: 21 IU/L (ref 0–40)
Albumin/Globulin Ratio: 1.8 (ref 1.2–2.2)
Albumin: 4.2 g/dL (ref 3.8–4.8)
Alkaline Phosphatase: 73 IU/L (ref 39–117)
BUN/Creatinine Ratio: 28 — ABNORMAL HIGH (ref 10–24)
BUN: 27 mg/dL (ref 8–27)
Bilirubin Total: 0.3 mg/dL (ref 0.0–1.2)
CO2: 27 mmol/L (ref 20–29)
Calcium: 9.3 mg/dL (ref 8.6–10.2)
Chloride: 101 mmol/L (ref 96–106)
Creatinine, Ser: 0.95 mg/dL (ref 0.76–1.27)
GFR calc Af Amer: 99 mL/min/{1.73_m2} (ref >59)
GFR calc non Af Amer: 85 mL/min/{1.73_m2} (ref >59)
Globulin, Total: 2.4 g/dL (ref 1.5–4.5)
Glucose: 94 mg/dL (ref 65–99)
Potassium: 4.4 mmol/L (ref 3.5–5.2)
Sodium: 139 mmol/L (ref 134–144)
Total Protein: 6.6 g/dL (ref 6.0–8.5)

## 2019-12-19 LAB — LIPID PANEL W/O CHOL/HDL RATIO
Cholesterol, Total: 161 mg/dL (ref 100–199)
HDL: 62 mg/dL (ref >39)
LDL Chol Calc (NIH): 81 mg/dL (ref 0–99)
Triglycerides: 102 mg/dL (ref 0–149)
VLDL Cholesterol Cal: 18 mg/dL (ref 5–40)

## 2019-12-19 LAB — MAGNESIUM: Magnesium: 2.2 mg/dL (ref 1.6–2.3)

## 2019-12-19 LAB — PSA: Prostate Specific Ag, Serum: 1.3 ng/mL (ref 0.0–4.0)

## 2019-12-19 LAB — TSH: TSH: 0.548 u[IU]/mL (ref 0.450–4.500)

## 2019-12-19 LAB — VITAMIN D 25 HYDROXY (VIT D DEFICIENCY, FRACTURES): Vit D, 25-Hydroxy: 46.4 ng/mL (ref 30.0–100.0)

## 2019-12-19 NOTE — Progress Notes (Signed)
Please call or send letter letting Jose Liu know following: Your labs have returned normal.  Prostate, thyroid, Vit D, kidney, and liver function look great.  Cholesterol levels are almost at goal without medication.  You are doing a truly stellar job!!  Keep up the good work.  Let me know if any questions or needs.

## 2019-12-21 ENCOUNTER — Ambulatory Visit (INDEPENDENT_AMBULATORY_CARE_PROVIDER_SITE_OTHER): Payer: No Typology Code available for payment source

## 2019-12-21 ENCOUNTER — Encounter: Payer: Self-pay | Admitting: Nurse Practitioner

## 2019-12-21 ENCOUNTER — Other Ambulatory Visit: Payer: Self-pay

## 2019-12-21 DIAGNOSIS — J4520 Mild intermittent asthma, uncomplicated: Secondary | ICD-10-CM | POA: Diagnosis not present

## 2019-12-21 MED ORDER — OMALIZUMAB 150 MG/ML ~~LOC~~ SOSY
150.0000 mg | PREFILLED_SYRINGE | Freq: Once | SUBCUTANEOUS | Status: AC
  Administered 2019-12-21: 150 mg via SUBCUTANEOUS

## 2019-12-21 MED ORDER — OMALIZUMAB 150 MG/ML ~~LOC~~ SOSY
150.0000 mg | PREFILLED_SYRINGE | Freq: Once | SUBCUTANEOUS | Status: AC
  Administered 2019-12-21: 15:00:00 150 mg via SUBCUTANEOUS

## 2019-12-21 NOTE — Progress Notes (Signed)
All questions were answered by the patient before medication was administered. Have you been hospitalized in the last 10 days? No Do you have a fever? No Do you have a cough? No Do you have a headache or sore throat? No  

## 2019-12-21 NOTE — Telephone Encounter (Signed)
Colonoscopy date abstracted.

## 2019-12-23 NOTE — Progress Notes (Signed)
Progress Notes by Allene Dillon, PT at 12/23/19 1900                Author: Allene Dillon, PT  Service: Physical Therapy  Author Type: Physical Therapist       Filed: 12/23/19 0727  Date of Service: 12/23/19 1900  Status: Signed          Editor: Allene Dillon, PT (Physical Therapist)                         Barton Dubois   DOB: [birth year]   Primary: [facility]   Secondary:   Therapy Center at [facility]   [address], [place] [address]   Phone:[phone]   [phone]                 OUTPATIENT DAILY NOTE      NAME/AGE/GENDER: Steven [name]  is a 64 y.o. male.       DATE: 12/23/2019      Patient cancelled (more than 24 hours ago) his appointment for today due to traveling out of town.  Will plan to follow up on next scheduled visit.      Allene Dillon, PT        Future Appointments           Date  Time  Provider  Department  Center           12/23/2019   7:00 PM  Allene Dillon, [place]  [facility]  SFE     12/30/2019   1:45 PM  Allene Dillon, PT  [facility]  SFE           01/06/2020   3:00 PM  Claretha Cooper, MD  [facility]  COC

## 2019-12-23 NOTE — Progress Notes (Signed)
Barton Dubois  DOB: [birth year]  Primary: [facility]  Secondary:  Therapy Center at [facility]  [address], [place] [address]  Phone:[phone]   Fax:[phone]          OUTPATIENT DAILY NOTE    NAME/AGE/GENDER: Steven [name] is a 64 y.o. male.     DATE: 12/23/2019    Patient cancelled (more than 24 hours ago) his appointment for today due to traveling out of town.  Will plan to follow up on next scheduled visit.    Allene Dillon, PT    Future Appointments   Date Time Provider Department Center   12/23/2019  7:00 PM Allene Dillon, [place] [facility] SFE   12/30/2019  1:45 PM Allene Dillon, PT [facility] SFE   01/06/2020  3:00 PM Claretha Cooper, MD [facility] COC

## 2019-12-24 ENCOUNTER — Inpatient Hospital Stay: Payer: BLUE CROSS/BLUE SHIELD | Primary: Family Medicine

## 2019-12-30 ENCOUNTER — Inpatient Hospital Stay: Payer: BLUE CROSS/BLUE SHIELD | Primary: Family Medicine

## 2019-12-30 NOTE — Progress Notes (Signed)
Progress Notes by Allene Dillon, PT at 12/30/19 0981                Author: Allene Dillon, PT  Service: Physical Therapy  Author Type: Physical Therapist       Filed: 12/30/19 1353  Date of Service: 12/30/19 0814  Status: Signed          Editor: Allene Dillon, PT (Physical Therapist)                         Barton Dubois   DOB: June 28, 1956   Primary: [facility]   Secondary:   Therapy Center at [facility]   [address], [address], [place] [address]   Phone:[phone]   [phone]                 OUTPATIENT DAILY NOTE      NAME/AGE/GENDER: Steven [name]  is a 64 y.o. male.       DATE: 12/30/2019      Patient cancelled (less than 24 hours ago) his appointment for today due to reasons not given.  Will plan to follow up on next scheduled visit.      Allene Dillon, PT        Future Appointments           Date  Time  Provider  Department  Center           01/06/2020   3:00 PM  Claretha Cooper, MD  [facility]  COC

## 2019-12-30 NOTE — Progress Notes (Signed)
Steven [name]  DOB: Apr 16, 1956  Primary: [facility]  Secondary:  Therapy Center at [facility]  [address], [place] [address]  Phone:[phone]   Fax:[phone]          OUTPATIENT DAILY NOTE    NAME/AGE/GENDER: Steven [name] is a 64 y.o. male.     DATE: 12/30/2019    Patient cancelled (less than 24 hours ago) his appointment for today due to reasons not given.  Will plan to follow up on next scheduled visit.    Allene Dillon, PT    Future Appointments   Date Time Provider Department Center   01/06/2020  3:00 PM Claretha Cooper, MD [facility] C[facility]

## 2020-01-01 ENCOUNTER — Other Ambulatory Visit: Payer: Self-pay

## 2020-01-01 MED ORDER — FAMOTIDINE 40 MG PO TABS: 40.0000 mg | ORAL_TABLET | Freq: Every day | ORAL | 3 refills | Status: DC

## 2020-01-01 NOTE — Telephone Encounter (Signed)
Refill request for Famotidine  LOV: 12/18/2019 Next Appt: 01/29/2020

## 2020-01-04 ENCOUNTER — Ambulatory Visit: Payer: No Typology Code available for payment source | Admitting: Primary Care

## 2020-01-06 ENCOUNTER — Encounter: Attending: Orthopaedic Surgery | Primary: Family Medicine

## 2020-01-14 ENCOUNTER — Ambulatory Visit: Payer: No Typology Code available for payment source

## 2020-01-15 ENCOUNTER — Ambulatory Visit (INDEPENDENT_AMBULATORY_CARE_PROVIDER_SITE_OTHER): Payer: No Typology Code available for payment source

## 2020-01-15 ENCOUNTER — Other Ambulatory Visit: Payer: Self-pay

## 2020-01-15 DIAGNOSIS — J4520 Mild intermittent asthma, uncomplicated: Secondary | ICD-10-CM

## 2020-01-15 MED ORDER — OMALIZUMAB 150 MG ~~LOC~~ SOLR
300.0000 mg | Freq: Once | SUBCUTANEOUS | Status: AC
  Administered 2020-01-15: 10:00:00 300 mg via SUBCUTANEOUS

## 2020-01-15 NOTE — Progress Notes (Signed)
Have you been hospitalized within the last 10 days?  No Do you have a fever?  No Do you have a cough?  No Do you have a headache or sore throat? No Do you have your Epi Pen visible and is it within date?  Yes 

## 2020-01-29 ENCOUNTER — Encounter: Payer: Self-pay | Admitting: Nurse Practitioner

## 2020-01-29 ENCOUNTER — Telehealth (INDEPENDENT_AMBULATORY_CARE_PROVIDER_SITE_OTHER): Payer: No Typology Code available for payment source | Admitting: Nurse Practitioner

## 2020-01-29 VITALS — BP 139/75 | HR 94 | Wt 310.0 lb

## 2020-01-29 DIAGNOSIS — M1990 Unspecified osteoarthritis, unspecified site: Secondary | ICD-10-CM

## 2020-01-29 DIAGNOSIS — M5442 Lumbago with sciatica, left side: Secondary | ICD-10-CM | POA: Diagnosis not present

## 2020-01-29 DIAGNOSIS — G8929 Other chronic pain: Secondary | ICD-10-CM

## 2020-01-29 NOTE — Progress Notes (Signed)
BP 139/75   Pulse 94   Wt (!) 310 lb (140.6 kg)   BMI 43.85 kg/m    Subjective:    Patient ID: Jose Liu, male    DOB: [birth year], 64 y.o.   MRN: [id number]  HPI: Jose Liu is a 64 y.o. male  Chief Complaint  Patient presents with  . Arthritis    . This visit was completed via MyChart due to the restrictions of the COVID-19 pandemic. All issues as above were discussed and addressed. Physical exam was done as above through visual confirmation on MyChart. If it was felt that the patient should be evaluated in the office, they were directed there. The patient verbally consented to this visit. . Location of the patient: home . Location of the provider: work . Those involved with this call:  . Provider: Marnee Guarneri, DNP . CMA: Yvonna Alanis, CMA . Front Desk/Registration: Don Perking  . Time spent on call: 15 minutes with patient face to face via video conference. More than 50% of this time was spent in counseling and coordination of care. 10 minutes total spent in review of patient's record and preparation of their chart.  . I verified patient identity using two factors (patient name and date of birth). Patient consents verbally to being seen via telemedicine visit today.    BACK PAIN & SHOULDER PAIN Started Gabapentin 300 MG at bedtime last visit.  Reports he is able to walk about 1/2 mile to 1 mile now, which is unheard of for him.  Reports Gabapentin helps him to sleep too at night.  Helping the shoulder and back.    He is supposed to have back surgery, his "L4-L5 are crushed". Has radiculopathy, left leg. Has arthritis and bursitis in hips. Has been told he has shoulder issues too, with impingement due to arthritis. Is fearful of surgery for back, which has been recommended, due to past experiences with surgery and not receiving enough anesthesia. Does not stay under well.  Also has impingement history of shoulders, which is currently causing numbness and  pain in hands R>L.  History of working on railroad, multiple areas of arthritis, has "two bad discs in neck" too.   Duration:chronic Mechanism of injury:unknown Location:bilateral and low back Onset:gradual Severity:mild Quality:dull and aching Frequency:intermittent Radiation:L leg below the knee Aggravating factors:lifting, movement, walking and bending Alleviating factors: APAP Status:stable Treatments attempted:APAP Relief with NSAIDs?:No NSAIDs Taken Nighttime pain:no Paresthesias / decreased sensation:no Bowel / bladder incontinence:no Fevers:no Dysuria / urinary frequency:no  Relevant past medical, surgical, family and social history reviewed and updated as indicated. Interim medical history since our last visit reviewed. Allergies and medications reviewed and updated.  Review of Systems  Constitutional: Negative for activity change, diaphoresis, fatigue and fever.  Respiratory: Negative for cough, chest tightness, shortness of breath and wheezing.   Cardiovascular: Negative for chest pain, palpitations and leg swelling.  Gastrointestinal: Negative.   Endocrine: Negative.   Musculoskeletal: Positive for back pain.  Neurological: Negative.   Psychiatric/Behavioral: Negative.     Per HPI unless specifically indicated above     Objective:    BP 139/75   Pulse 94   Wt (!) 310 lb (140.6 kg)   BMI 43.85 kg/m   Wt Readings from Last 3 Encounters:  01/29/20 (!) 310 lb (140.6 kg)  12/18/19 (!) 322 lb 3.2 oz (146.1 kg)  11/12/19 (!) 320 lb 3.2 oz (145.2 kg)    Physical Exam Vitals and nursing note reviewed.  Constitutional:  General: He is awake. He is not in acute distress.    Appearance: He is well-developed. He is not ill-appearing.  HENT:     Head: Normocephalic.     Right Ear: Hearing normal. No drainage.     Left Ear: Hearing normal. No drainage.  Eyes:     General: Lids are normal.        Right eye: No discharge.        Left  eye: No discharge.     Conjunctiva/sclera: Conjunctivae normal.  Pulmonary:     Effort: Pulmonary effort is normal. No accessory muscle usage or respiratory distress.  Musculoskeletal:     Cervical back: Normal range of motion.  Neurological:     Mental Status: He is alert and oriented to person, place, and time.  Psychiatric:        Mood and Affect: Mood normal.        Behavior: Behavior normal. Behavior is cooperative.        Thought Content: Thought content normal.        Judgment: Judgment normal.    Results for orders placed or performed in visit on 12/21/19  HM COLONOSCOPY  Result Value Ref Range   HM Colonoscopy Patient Reported See Report (in chart), Patient Reported      Assessment & Plan:   Problem List Items Addressed This Visit      Musculoskeletal and Integument   Arthritis    Ongoing to back, shoulders, and hips.  Addition of Gabapentin has significantly helped quality of life at this time, continue this regimen.  Continue Tylenol & Biofreeze as needed, uses minimally and prefers not to take many pain medications.  Recommend focus on weight loss, which would benefit joint pain.          Other   Low back pain - Primary    Ongoing issue, was recommended to have back surgery but due to weight and fear of anesthesia he is hesitant.  Started on Gabapentin 300 MG at night last visit and this has greatly benefited his quality of life, continue this regimen and adjust as needed.  Continue Biofreeze and Tylenol as needed.  He is not wishing to take much pain medication.  Could also consider Duloxetine in future, which would benefit pain along with Gabapentin.  Will consider PT if ongoing discomfort. Return in 6 months, sooner if worsening pain.         I discussed the assessment and treatment plan with the patient. The patient was provided an opportunity to ask questions and all were answered. The patient agreed with the plan and demonstrated an understanding of the  instructions.   The patient was advised to call back or seek an in-person evaluation if the symptoms worsen or if the condition fails to improve as anticipated.   I provided 15+ minutes of time during this encounter.  Follow up plan: Return in about 6 months (around 07/30/2020) for HTN, Asthma, Arthritis.

## 2020-01-29 NOTE — Patient Instructions (Signed)

## 2020-01-29 NOTE — Assessment & Plan Note (Signed)
Ongoing to back, shoulders, and hips.  Addition of Gabapentin has significantly helped quality of life at this time, continue this regimen.  Continue Tylenol & Biofreeze as needed, uses minimally and prefers not to take many pain medications.  Recommend focus on weight loss, which would benefit joint pain.

## 2020-01-29 NOTE — Assessment & Plan Note (Signed)
Ongoing issue, was recommended to have back surgery but due to weight and fear of anesthesia he is hesitant.  Started on Gabapentin 300 MG at night last visit and this has greatly benefited his quality of life, continue this regimen and adjust as needed.  Continue Biofreeze and Tylenol as needed.  He is not wishing to take much pain medication.  Could also consider Duloxetine in future, which would benefit pain along with Gabapentin.  Will consider PT if ongoing discomfort. Return in 6 months, sooner if worsening pain.

## 2020-01-30 ENCOUNTER — Encounter: Payer: Self-pay | Admitting: Nurse Practitioner

## 2020-02-01 ENCOUNTER — Telehealth: Payer: Self-pay | Admitting: Emergency Medicine

## 2020-02-01 NOTE — Telephone Encounter (Signed)
Xolair Order: # vials: 6 Ordered date: 02/01/2020  Expected date of arrival: 02/04/2020 Ordered by: Desmond Dike, [place]  Specialty Pharmacy: [facility]

## 2020-02-02 ENCOUNTER — Encounter: Payer: Self-pay | Admitting: Nurse Practitioner

## 2020-02-03 ENCOUNTER — Telehealth: Payer: Self-pay | Admitting: Emergency Medicine

## 2020-02-04 NOTE — Telephone Encounter (Signed)
Xolair Received: # Vials: 2 Medication arrival date: 02/04/20 Lot #: [id number] Exp date: 07/29/2023 Received by: Elliot Dally

## 2020-02-10 ENCOUNTER — Encounter: Payer: Self-pay | Admitting: Nurse Practitioner

## 2020-02-11 ENCOUNTER — Ambulatory Visit (INDEPENDENT_AMBULATORY_CARE_PROVIDER_SITE_OTHER): Payer: No Typology Code available for payment source

## 2020-02-11 ENCOUNTER — Other Ambulatory Visit: Payer: Self-pay

## 2020-02-11 DIAGNOSIS — J4520 Mild intermittent asthma, uncomplicated: Secondary | ICD-10-CM

## 2020-02-11 MED ORDER — OMALIZUMAB 150 MG ~~LOC~~ SOLR
300.0000 mg | SUBCUTANEOUS | Status: DC
  Administered 2020-02-11: 300 mg via SUBCUTANEOUS

## 2020-02-11 NOTE — Telephone Encounter (Signed)
Pt will need to discuss this matter with his pharmacy, not our office as we do not supply him with the medication. I will discuss this with the pt at his appointment today.

## 2020-02-11 NOTE — Progress Notes (Signed)
All questions were answered by the patient before medication was administered. Have you been hospitalized in the last 10 days? No Do you have a fever? No Do you have a cough? No Do you have a headache or sore throat? No  

## 2020-02-17 ENCOUNTER — Ambulatory Visit: Attending: Orthopaedic Surgery | Primary: Family Medicine

## 2020-02-17 ENCOUNTER — Ambulatory Visit
Admit: 2020-02-17 | Discharge: 2020-02-17 | Payer: BLUE CROSS/BLUE SHIELD | Attending: Orthopaedic Surgery | Primary: Family Medicine

## 2020-02-17 DIAGNOSIS — Z9889 Other specified postprocedural states: Secondary | ICD-10-CM

## 2020-02-17 NOTE — Progress Notes (Signed)
HPI: Patient presents for follow-up of his right shoulder rotator cuff repair with patch.  His surgery was December 2020.  He states that he is doing very well.  He occasionally gets an awkward position that is uncomfortable but his pain is very brief.  Those episodes are improving.  He is doing home exercises.  His preoperative pain is resolved.  He is self-employed and is pursuing activity as tolerated.    ROS-noncontributory except as noted above      Past Medical History:   Diagnosis Date   ??? Allergic rhinitis, cause unspecified    ??? BMI 40.0-44.9, adult ([place])    ??? Chronic left sacroiliac joint pain    ??? Chronic radicular low back pain         ??? Diverticulitis    ??? Essential hypertension, benign    ??? Generalized anxiety disorder    ??? GERD (gastroesophageal reflux disease)     occ no meds   ??? Hypercholesterolemia    ??? Left lumbar radiculitis     He has had low back pain that radiates down his LLE for years.   ??? 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 Mood disorder ([place]) 07/14/2014   ??? Morbid obesity ([place])     bmi = 40   ??? OSA on CPAP     wears cpap at hs   ??? Polycythemia dx--06/2015    followed by Dr Faith Rogue (heme-onc); "Advised to consider stopping testosterone replacement. Serial monitoring of hct: phlebotomy only if hct over 65%" per office note 10/10/16. Hgb 18-19 since 06/2012.   ??? Prediabetes     does not check BG at home, metformin daily, last hgba1c- 6.8   ??? Spondylolisthesis at L4-L5 level 10/19/2015   ??? Testicular hypofunction    ??? Thrombophlebitis of left leg ([facility]) 04/2015    posterior tibial vein and peroneal vein   ??? Venous insufficiency of both lower extremities 07/14/2014   ??? Vitamin D deficiency             Past Surgical History:   Procedure Laterality Date   ??? ENDOSCOPY, COLON, DIAGNOSTIC  2011    Diverticulosis   ??? HX COLONOSCOPY  05/2015    Diverticulosis   ??? HX KNEE ARTHROSCOPY Bilateral 04/2015    Partial medial and lateral meniscectomies by Dr. Eliseo Squires   ??? HX ORTHOPAEDIC Left  10/19/2015    L4-5 TLIF, decompression. Dr. Raul Del        Family History   Problem Relation Age of Onset   ??? Diabetes Mother    ??? Alzheimer Mother    ??? Heart Disease Father         atherosclerosis (heavy smoker)   ??? Diabetes Sister         Current Outpatient Medications   Medication Sig Dispense Refill   ??? meclizine (ANTIVERT) 25 mg tablet Take 25 mg by mouth three (3) times daily as needed for Dizziness.     ??? atorvastatin (LIPITOR) 40 mg tablet Take  by mouth nightly.     ??? melatonin 3 mg tablet Take  by mouth nightly.     ??? olmesartan-hydroCHLOROthiazide (BENICAR HCT) 40-12.5 mg per tablet Take 1 Tab by mouth daily. 90 Tab 1   ??? DULoxetine (CYMBALTA) 30 mg capsule Take 1 Cap by mouth daily. 90 Cap 1   ??? metFORMIN (GLUCOPHAGE) 1,000 mg tablet TAKE ONE TABLET BY MOUTH TWICE A DAY 180 Tab 1   ??? OMEPRAZOLE PO Take 20 mg by  mouth nightly.     ??? aspirin (ASPIRIN LOW-STRENGTH) 81 mg chewable tablet Take 81 mg by mouth every morning. Indications: continue     ??? MULTI-VITAMIN HI-PO PO Take 2 Tabs by mouth every morning. Indications: hold until after surgery          Allergies   Allergen Reactions   ??? Nsaids (Non-Steroidal Anti-Inflammatory Drug) Other (comments)     Cause pedal edema if taken for a number of days in a row        Vitals:    02/17/20 1044   Weight: 278 lb (126.1 kg)        PHYSICAL EXAM: Pleasant cooperative gentleman in no acute distress.  He is obese.  No pain with range of motion cervical spine.  His right shoulder incision and portals are well-healed.  He has intact active rotator cuff function.  His range of motion is slightly limited at the end of abduction and external rotation.  Compartments are soft and nontender.  No focal motor or sensory nerve deficits.  No adenopathy or skin changes.  Palpable pulses distally.    DIAGNOSTIC TESTS: Previous x-rays reviewed        ICD-10-CM ICD-9-CM    1. S/P right rotator cuff repair  Z98.890 V45.89         PLAN: Right shoulder rotator cuff repair doing well.   Continue activity as tolerated.  14-month timeline reviewed.

## 2020-02-17 NOTE — Progress Notes (Signed)
HPI: Patient presents for follow-up of his right shoulder rotator cuff repair with patch.  His surgery was December 2020.  He states that he is doing very well.  He occasionally gets an awkward position that is uncomfortable but his pain is very brief.  Those episodes are improving.  He is doing home exercises.  His preoperative pain is resolved.  He is self-employed and is pursuing activity as tolerated.    ROS-noncontributory except as noted above      Past Medical History:   Diagnosis Date   ??? Allergic rhinitis, cause unspecified    ??? BMI 40.0-44.9, adult ([facility])    ??? Chronic left sacroiliac joint pain    ??? Chronic radicular low back pain         ??? Diverticulitis    ??? Essential hypertension, benign    ??? Generalized anxiety disorder    ??? GERD (gastroesophageal reflux disease)     occ no meds   ??? Hypercholesterolemia    ??? Left lumbar radiculitis     He has had low back pain that radiates down his LLE for years.   ??? 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 Mood disorder ([facility]) 07/14/2014   ??? Morbid obesity ([facility])     bmi = 40   ??? OSA on CPAP     wears cpap at hs   ??? Polycythemia dx--06/2015    followed by Dr Quddus (heme-onc); "Advised to consider stopping testosterone replacement. Serial monitoring of hct: phlebotomy only if hct over 65%" per office note 10/10/16. Hgb 18-19 since 06/2012.   ??? Prediabetes     does not check BG at home, metformin daily, last hgba1c- 6.8   ??? Spondylolisthesis at L4-L5 level 10/19/2015   ??? Testicular hypofunction    ??? Thrombophlebitis of left leg ([facility]) 04/2015    posterior tibial vein and peroneal vein   ??? Venous insufficiency of both lower extremities 07/14/2014   ??? Vitamin D deficiency             Past Surgical History:   Procedure Laterality Date   ??? ENDOSCOPY, COLON, DIAGNOSTIC  2011    Diverticulosis   ??? HX COLONOSCOPY  05/2015    Diverticulosis   ??? HX KNEE ARTHROSCOPY Bilateral 04/2015    Partial medial and lateral meniscectomies by Dr. Vann   ??? HX ORTHOPAEDIC Left  10/19/2015    L4-5 TLIF, decompression. Dr. Mina        Family History   Problem Relation Age of Onset   ??? Diabetes Mother    ??? Alzheimer Mother    ??? Heart Disease Father         atherosclerosis (heavy smoker)   ??? Diabetes Sister         Current Outpatient Medications   Medication Sig Dispense Refill   ??? meclizine (ANTIVERT) 25 mg tablet Take 25 mg by mouth three (3) times daily as needed for Dizziness.     ??? atorvastatin (LIPITOR) 40 mg tablet Take  by mouth nightly.     ??? melatonin 3 mg tablet Take  by mouth nightly.     ??? olmesartan-hydroCHLOROthiazide (BENICAR HCT) 40-12.5 mg per tablet Take 1 Tab by mouth daily. 90 Tab 1   ??? DULoxetine (CYMBALTA) 30 mg capsule Take 1 Cap by mouth daily. 90 Cap 1   ??? metFORMIN (GLUCOPHAGE) 1,000 mg tablet TAKE ONE TABLET BY MOUTH TWICE A DAY 180 Tab 1   ??? OMEPRAZOLE PO Take 20 mg by   mouth nightly.     ??? aspirin (ASPIRIN LOW-STRENGTH) 81 mg chewable tablet Take 81 mg by mouth every morning. Indications: continue     ??? MULTI-VITAMIN HI-PO PO Take 2 Tabs by mouth every morning. Indications: hold until after surgery          Allergies   Allergen Reactions   ??? Nsaids (Non-Steroidal Anti-Inflammatory Drug) Other (comments)     Cause pedal edema if taken for a number of days in a row        Vitals:    02/17/20 1044   Weight: 278 lb (126.1 kg)        PHYSICAL EXAM: Pleasant cooperative gentleman in no acute distress.  He is obese.  No pain with range of motion cervical spine.  His right shoulder incision and portals are well-healed.  He has intact active rotator cuff function.  His range of motion is slightly limited at the end of abduction and external rotation.  Compartments are soft and nontender.  No focal motor or sensory nerve deficits.  No adenopathy or skin changes.  Palpable pulses distally.    DIAGNOSTIC TESTS: Previous x-rays reviewed        ICD-10-CM ICD-9-CM    1. S/P right rotator cuff repair  Z98.890 V45.89         PLAN: Right shoulder rotator cuff repair doing well.   Continue activity as tolerated.  14-month timeline reviewed.

## 2020-02-29 ENCOUNTER — Telehealth: Payer: Self-pay | Admitting: Emergency Medicine

## 2020-02-29 ENCOUNTER — Encounter: Payer: Self-pay | Admitting: Nurse Practitioner

## 2020-02-29 NOTE — Telephone Encounter (Signed)
Called [facility] to schedule Xolair delivery, spoke with Jeneen Rinks.  Jeneen Rinks stated Xolair claim is being denied due to Xolair PA expiration. Patient is scheduled for Xolair injection 03/10/20.  Message routed to pharm team to follow up

## 2020-03-01 NOTE — Telephone Encounter (Signed)
[facility] for [facility]. Called and answered clinical questions over the phone. Will receive fax of determination.  PA# [id number] Phone# [phone]  11:18 AM Beatriz Chancellor, CPhT

## 2020-03-02 NOTE — Telephone Encounter (Signed)
Xolair Order: # vials: 2 Ordered date: 03/02/20 Expected date of arrival: 03/08/20 Ordered by: [place]: [facility]

## 2020-03-02 NOTE — Telephone Encounter (Signed)
Received notification from [facility] regarding a prior authorization for XOLAIR. Authorization has been APPROVED from 03/01/20 to 03/01/21.   Authorization # [id number] Phone # [phone]

## 2020-03-08 NOTE — Telephone Encounter (Signed)
Xolair Received: # Vials: 2 Medication arrival date: 03/08/20 Lot #: [id number] Exp date: 08/29/2023 Received by: Elliot Dally

## 2020-03-10 ENCOUNTER — Ambulatory Visit (INDEPENDENT_AMBULATORY_CARE_PROVIDER_SITE_OTHER): Payer: No Typology Code available for payment source

## 2020-03-10 ENCOUNTER — Other Ambulatory Visit: Payer: Self-pay

## 2020-03-10 DIAGNOSIS — J4520 Mild intermittent asthma, uncomplicated: Secondary | ICD-10-CM | POA: Diagnosis not present

## 2020-03-10 MED ORDER — OMALIZUMAB 150 MG ~~LOC~~ SOLR
300.0000 mg | Freq: Once | SUBCUTANEOUS | Status: AC
  Administered 2020-03-10: 300 mg via SUBCUTANEOUS

## 2020-03-10 NOTE — Progress Notes (Signed)
Have you been hospitalized within the last 10 days?  No Do you have a fever?  No Do you have a cough?  No Do you have a headache or sore throat? No Do you have your Epi Pen visible and is it within date?  Yes 

## 2020-03-29 ENCOUNTER — Telehealth: Payer: Self-pay | Admitting: Emergency Medicine

## 2020-03-29 NOTE — Telephone Encounter (Addendum)
Xolair Order: # vials: 2 Ordered date: 03/29/2020 Expected date of arrival: 03/31/2020 Ordered by: Desmond Dike, [place]  Specialty Pharmacy: [facility]

## 2020-03-31 NOTE — Telephone Encounter (Signed)
Xolair Received: # Vials: 2 Medication arrival date: 03/31/2020 Lot #: [id number] Exp date: 10/2023 Received by: Desmond Dike, [place]

## 2020-04-04 ENCOUNTER — Ambulatory Visit (INDEPENDENT_AMBULATORY_CARE_PROVIDER_SITE_OTHER): Payer: No Typology Code available for payment source

## 2020-04-04 ENCOUNTER — Other Ambulatory Visit: Payer: Self-pay

## 2020-04-04 DIAGNOSIS — J4520 Mild intermittent asthma, uncomplicated: Secondary | ICD-10-CM | POA: Diagnosis not present

## 2020-04-04 MED ORDER — OMALIZUMAB 150 MG ~~LOC~~ SOLR
300.0000 mg | SUBCUTANEOUS | Status: DC
  Administered 2020-04-04: 300 mg via SUBCUTANEOUS

## 2020-04-04 NOTE — Progress Notes (Signed)
All questions were answered by the patient before medication was administered. Have you been hospitalized in the last 10 days? No Do you have a fever? No Do you have a cough? No Do you have a headache or sore throat? No  

## 2020-04-25 ENCOUNTER — Telehealth: Payer: Self-pay | Admitting: Emergency Medicine

## 2020-04-25 NOTE — Telephone Encounter (Signed)
Xolair Order: # vials: 2 Ordered date: 04/25/2020 Expected date of arrival: 04/28/2020 Ordered by: Desmond Dike, [place]  Specialty Pharmacy: [facility]

## 2020-04-28 NOTE — Telephone Encounter (Signed)
Xolair Received: # Vials: 2 Medication arrival date: 04/28/2020 Lot #: [id number] Exp date: 10/2023 Received by: Desmond Dike, [place]

## 2020-05-05 ENCOUNTER — Telehealth: Payer: Self-pay | Admitting: Emergency Medicine

## 2020-05-05 ENCOUNTER — Other Ambulatory Visit: Payer: Self-pay

## 2020-05-05 ENCOUNTER — Ambulatory Visit (INDEPENDENT_AMBULATORY_CARE_PROVIDER_SITE_OTHER): Payer: No Typology Code available for payment source

## 2020-05-05 DIAGNOSIS — J45909 Unspecified asthma, uncomplicated: Secondary | ICD-10-CM

## 2020-05-05 DIAGNOSIS — J4520 Mild intermittent asthma, uncomplicated: Secondary | ICD-10-CM

## 2020-05-05 DIAGNOSIS — G4733 Obstructive sleep apnea (adult) (pediatric): Secondary | ICD-10-CM

## 2020-05-05 MED ORDER — OMALIZUMAB 150 MG ~~LOC~~ SOLR
300.0000 mg | Freq: Once | SUBCUTANEOUS | Status: AC
  Administered 2020-05-05: 300 mg via SUBCUTANEOUS

## 2020-05-05 NOTE — Telephone Encounter (Signed)
Patient calling asking if he still needs the PFT ordered on 11/12/2019. He is now vaccinated and is ready to schedule if needed. Also, can we order CPAP supplies, he does not have a DME company here in [place]?

## 2020-05-05 NOTE — Progress Notes (Signed)
Have you been hospitalized within the last 10 days?  No Do you have a fever?  No Do you have a cough?  No Do you have a headache or sore throat? No Do you have your Epi Pen visible and is it within date?  Yes 

## 2020-05-11 NOTE — Telephone Encounter (Signed)
Spoke with the pt and scheduled for PFT  Will send order for CPAP supplies

## 2020-05-11 NOTE — Telephone Encounter (Signed)
I think will be good to schedule pulmonary function testing whenever he is comfortable doing so, not urgent but will help [place] going forward.  We can connect him with a local DME company to service and help get supplies for his CPAP.  We will likely need his PSG information to forward to the DME company.  Hopefully we can get this from his notes from his [place].

## 2020-05-13 ENCOUNTER — Encounter: Payer: Self-pay | Admitting: Nurse Practitioner

## 2020-05-13 ENCOUNTER — Other Ambulatory Visit: Payer: Self-pay

## 2020-05-13 ENCOUNTER — Other Ambulatory Visit (HOSPITAL_COMMUNITY)
Admission: RE | Admit: 2020-05-13 | Discharge: 2020-05-13 | Disposition: A | Discharge status: Home / Self Care | Payer: BLUE CROSS/BLUE SHIELD | Source: Ambulatory Visit | Attending: Nurse Practitioner | Admitting: Nurse Practitioner

## 2020-05-13 ENCOUNTER — Ambulatory Visit: Payer: No Typology Code available for payment source | Admitting: Nurse Practitioner

## 2020-05-13 VITALS — BP 140/80 | HR 90 | Temp 99.5°F | Wt 322.8 lb

## 2020-05-13 DIAGNOSIS — L918 Other hypertrophic disorders of the skin: Secondary | ICD-10-CM | POA: Insufficient documentation

## 2020-05-13 NOTE — Patient Instructions (Signed)
Wound Care, Adult Taking care of your wound properly can help to prevent pain, infection, and scarring. It can also help your wound to heal more quickly. How to care for your wound Wound care      Follow instructions from your health care provider about how to take care of your wound. Make sure you: ? Wash your hands with soap and water before you change the bandage (dressing). If soap and water are not available, use hand sanitizer. ? Change your dressing as told by your health care provider. ? Leave stitches (sutures), skin glue, or adhesive strips in place. These skin closures may need to stay in place for 2 weeks or longer. If adhesive strip edges start to loosen and curl up, you may trim the loose edges. Do not remove adhesive strips completely unless your health care provider tells you to do that.  Check your wound area every day for signs of infection. Check for: ? Redness, swelling, or pain. ? Fluid or blood. ? Warmth. ? Pus or a bad smell.  Ask your health care provider if you should clean the wound with mild soap and water. Doing this may include: ? Using a clean towel to pat the wound dry after cleaning it. Do not rub or scrub the wound. ? Applying a cream or ointment. Do this only as told by your health care provider. ? Covering the incision with a clean dressing.  Ask your health care provider when you can leave the wound uncovered.  Keep the dressing dry until your health care provider says it can be removed. Do not take baths, swim, use a hot tub, or do anything that would put the wound underwater until your health care provider approves. Ask your health care provider if you can take showers. You may only be allowed to take sponge baths. Medicines   If you were prescribed an antibiotic medicine, cream, or ointment, take or use the antibiotic as told by your health care provider. Do not stop taking or using the antibiotic even if your condition improves.  Take  over-the-counter and prescription medicines only as told by your health care provider. If you were prescribed pain medicine, take it 30 or more minutes before you do any wound care or as told by your health care provider. General instructions  Return to your normal activities as told by your health care provider. Ask your health care provider what activities are safe.  Do not scratch or pick at the wound.  Do not use any products that contain nicotine or tobacco, such as cigarettes and e-cigarettes. These may delay wound healing. If you need help quitting, ask your health care provider.  Keep all follow-up visits as told by your health care provider. This is important.  Eat a diet that includes protein, vitamin A, vitamin C, and other nutrient-rich foods to help the wound heal. ? Foods rich in protein include meat, dairy, beans, nuts, and other sources. ? Foods rich in vitamin A include carrots and dark green, leafy vegetables. ? Foods rich in vitamin C include citrus, tomatoes, and other fruits and vegetables. ? Nutrient-rich foods have protein, carbohydrates, fat, vitamins, or minerals. Eat a variety of healthy foods including vegetables, fruits, and whole grains. Contact a health care provider if:  You received a tetanus shot and you have swelling, severe pain, redness, or bleeding at the injection site.  Your pain is not controlled with medicine.  You have redness, swelling, or pain around the wound.    You have fluid or blood coming from the wound.  Your wound feels warm to the touch.  You have pus or a bad smell coming from the wound.  You have a fever or chills.  You are nauseous or you vomit.  You are dizzy. Get help right away if:  You have a red streak going away from your wound.  The edges of the wound open up and separate.  Your wound is bleeding, and the bleeding does not stop with gentle pressure.  You have a rash.  You faint.  You have trouble  breathing. Summary  Always wash your hands with soap and water before changing your bandage (dressing).  To help with healing, eat foods that are rich in protein, vitamin A, vitamin C, and other nutrients.  Check your wound every day for signs of infection. Contact your health care provider if you suspect that your wound is infected. This information is not intended to replace advice given to you by your health care provider. Make sure you discuss any questions you have with your health care provider. Document Revised: 02/02/2019 Document Reviewed: 05/01/2016 Elsevier Patient Education  2020 [employer] Inc[employer]  

## 2020-05-13 NOTE — Assessment & Plan Note (Addendum)
Skin tags x 2 between shoulder blades causing discomfort.  A dermablade was used to remove, able to successfully remove entirety of one (upper) and lower one only small amount removed (very small skin tag, discussed with patient may need to wait for this to enlarge a bit more) or use liquid nitrogen next visit.  If becomes irritating return to office.  If any s/s infection immediately alert provider or go to ER.  Skin tag (upper) sent to pathology.  Return as needed.

## 2020-05-13 NOTE — Progress Notes (Signed)
BP 140/80 (BP Location: Left Arm, Patient Position: Sitting, Cuff Size: Normal)   Pulse 90   Temp 99.5 F (37.5 C) (Oral)   Wt (!) 322 lb 12.8 oz (146.4 kg)   SpO2 96%   BMI 45.66 kg/m    Subjective:    Patient ID: Jose Liu, male    DOB: 1956-07-01, 64 y.o.   MRN: [id number]  HPI: Jose Liu is a 64 y.o. male  Chief Complaint  Patient presents with  . skin tags   SKIN LESION Skin tags between shoulder blade x 2.  Has been present for quit awhile and reports discomfort and it gets inflamed and wife places bandage over them.  Reports it is uncomfortable and would like them removed.   Duration: months Location: between shoulder blade Painful: at times if inflammed Itching: no Onset: gradual Context: not changing Associated signs and symptoms:  History of skin cancer: no History of precancerous skin lesions: no Family history of skin cancer: no  Relevant past medical, surgical, family and social history reviewed and updated as indicated. Interim medical history since our last visit reviewed. Allergies and medications reviewed and updated.  Review of Systems  Constitutional: Negative for activity change, diaphoresis, fatigue and fever.  Respiratory: Negative for cough, chest tightness, shortness of breath and wheezing.   Cardiovascular: Negative for chest pain, palpitations and leg swelling.  Gastrointestinal: Negative.   Endocrine: Negative for cold intolerance, heat intolerance, polydipsia, polyphagia and polyuria.  Neurological: Negative.   Psychiatric/Behavioral: Negative.     Per HPI unless specifically indicated above     Objective:    BP 140/80 (BP Location: Left Arm, Patient Position: Sitting, Cuff Size: Normal)   Pulse 90   Temp 99.5 F (37.5 C) (Oral)   Wt (!) 322 lb 12.8 oz (146.4 kg)   SpO2 96%   BMI 45.66 kg/m   Wt Readings from Last 3 Encounters:  05/13/20 (!) 322 lb 12.8 oz (146.4 kg)  01/29/20 (!) 310 lb (140.6 kg)  12/18/19 (!) 322 lb  3.2 oz (146.1 kg)    Physical Exam Vitals and nursing note reviewed.  Constitutional:      General: He is awake. He is not in acute distress.    Appearance: He is well-developed and well-groomed. He is morbidly obese. He is not ill-appearing.  HENT:     Head: Normocephalic and atraumatic.     Right Ear: Hearing normal. No drainage.     Left Ear: Hearing normal. No drainage.  Eyes:     General: Lids are normal.        Right eye: No discharge.        Left eye: No discharge.     Conjunctiva/sclera: Conjunctivae normal.     Pupils: Pupils are equal, round, and reactive to light.  Neck:     Trachea: Trachea normal.  Cardiovascular:     Rate and Rhythm: Normal rate and regular rhythm.     Heart sounds: Normal heart sounds, S1 normal and S2 normal. No murmur heard.  No gallop.   Pulmonary:     Effort: Pulmonary effort is normal. No accessory muscle usage or respiratory distress.     Breath sounds: Normal breath sounds.  Abdominal:     General: Bowel sounds are normal.     Palpations: Abdomen is soft.  Musculoskeletal:        General: Normal range of motion.     Cervical back: Normal range of motion and neck supple.  Right lower leg: No edema.     Left lower leg: No edema.  Skin:    General: Skin is warm and dry.     Capillary Refill: Capillary refill takes less than 2 seconds.     Comments: X 2 very small skin tags between shoulder blades with no erythema, edema, or tenderness.    Neurological:     Mental Status: He is alert and oriented to person, place, and time.  Psychiatric:        Attention and Perception: Attention normal.        Mood and Affect: Mood normal.        Speech: Speech normal.        Behavior: Behavior normal. Behavior is cooperative.        Thought Content: Thought content normal.    SKIN TAG REMOVAL Procedure: Skin tag removal between shoulder blades x 2 Description: After verbal consent and patient education on procedure area prepped and draped using  semi-sterile technique. Patient declined anesthesia.  A dermablade was used to remove, able to successfully remove entirety of one (upper) and lower one only small amount removed (very small skin tag, discussed with patient may need to wait for this to enlarge a bit more).  A bandage was then placed over the sites, along with triple abx ointment.  Complications:  none Post Procedure Instructions: To the ER if any symptoms of erythema or swelling.   Follow Up: PRN  Results for orders placed or performed in visit on 12/21/19  HM COLONOSCOPY  Result Value Ref Range   HM Colonoscopy Patient Reported See Report (in chart), Patient Reported      Assessment & Plan:   Problem List Items Addressed This Visit      Musculoskeletal and Integument   Skin tag - Primary    Skin tags x 2 between shoulder blades causing discomfort.  A dermablade was used to remove, able to successfully remove entirety of one (upper) and lower one only small amount removed (very small skin tag, discussed with patient may need to wait for this to enlarge a bit more) or use liquid nitrogen next visit.  If becomes irritating return to office.  If any s/s infection immediately alert provider or go to ER.  Skin tag (upper) sent to pathology.  Return as needed.      Relevant Orders   Surgical pathology       Follow up plan: Return if symptoms worsen or fail to improve.

## 2020-05-16 ENCOUNTER — Other Ambulatory Visit: Payer: Self-pay | Admitting: Nurse Practitioner

## 2020-05-16 MED ORDER — NYSTATIN 100000 UNIT/GM EX POWD: 1.0000 "application " | Freq: Two times a day (BID) | CUTANEOUS | 0 refills | Status: DC

## 2020-05-16 MED ORDER — NYSTATIN 100000 UNIT/GM EX CREA: 1.0000 "application " | TOPICAL_CREAM | Freq: Two times a day (BID) | CUTANEOUS | 0 refills | Status: DC

## 2020-05-17 LAB — SURGICAL PATHOLOGY

## 2020-05-23 ENCOUNTER — Telehealth: Payer: Self-pay | Admitting: Emergency Medicine

## 2020-05-23 NOTE — Telephone Encounter (Signed)
Xolair Order: # vials: 2 Ordered date: 05/23/20 Expected date of arrival: 05/31/20 Ordered by: [place]: [facility]

## 2020-05-31 NOTE — Telephone Encounter (Signed)
Xolair Received: # Vials: 2 Medication arrival date: 05/31/2020 Lot #: [id number] Exp date: 12/2023 Received by: Desmond Dike, [place]

## 2020-06-02 ENCOUNTER — Other Ambulatory Visit: Payer: Self-pay

## 2020-06-02 ENCOUNTER — Ambulatory Visit (INDEPENDENT_AMBULATORY_CARE_PROVIDER_SITE_OTHER): Payer: No Typology Code available for payment source

## 2020-06-02 DIAGNOSIS — J4520 Mild intermittent asthma, uncomplicated: Secondary | ICD-10-CM

## 2020-06-02 MED ORDER — OMALIZUMAB 150 MG ~~LOC~~ SOLR
300.0000 mg | SUBCUTANEOUS | Status: DC
  Administered 2020-06-02: 300 mg via SUBCUTANEOUS

## 2020-06-02 NOTE — Progress Notes (Signed)
All questions were answered by the patient before medication was administered. Have you been hospitalized in the last 10 days? No Do you have a fever? No Do you have a cough? No Do you have a headache or sore throat? No  

## 2020-06-06 ENCOUNTER — Ambulatory Visit (INDEPENDENT_AMBULATORY_CARE_PROVIDER_SITE_OTHER): Payer: No Typology Code available for payment source | Admitting: Emergency Medicine

## 2020-06-06 ENCOUNTER — Other Ambulatory Visit: Payer: Self-pay

## 2020-06-06 DIAGNOSIS — J4520 Mild intermittent asthma, uncomplicated: Secondary | ICD-10-CM

## 2020-06-06 LAB — PULMONARY FUNCTION TEST
DL/VA % pred: 132 %
DL/VA: 5.56 ml/min/mmHg/L
DLCO cor % pred: 120 %
DLCO cor: 32.94 ml/min/mmHg
DLCO unc % pred: 120 %
DLCO unc: 32.94 ml/min/mmHg
FEF 25-75 Post: 2.96 L/sec
FEF 25-75 Pre: 2.81 L/sec
FEF2575-%Change-Post: 5 %
FEF2575-%Pred-Post: 102 %
FEF2575-%Pred-Pre: 97 %
FEV1-%Change-Post: 1 %
FEV1-%Pred-Post: 88 %
FEV1-%Pred-Pre: 86 %
FEV1-Post: 3.13 L
FEV1-Pre: 3.08 L
FEV1FVC-%Change-Post: -1 %
FEV1FVC-%Pred-Pre: 106 %
FEV6-%Change-Post: 3 %
FEV6-%Pred-Post: 88 %
FEV6-%Pred-Pre: 85 %
FEV6-Post: 3.98 L
FEV6-Pre: 3.84 L
FEV6FVC-%Pred-Post: 105 %
FEV6FVC-%Pred-Pre: 105 %
FVC-%Change-Post: 3 %
FVC-%Pred-Post: 84 %
FVC-%Pred-Pre: 81 %
FVC-Post: 3.98 L
FVC-Pre: 3.84 L
Post FEV1/FVC ratio: 79 %
Post FEV6/FVC ratio: 100 %
Pre FEV1/FVC ratio: 80 %
Pre FEV6/FVC Ratio: 100 %
RV % pred: 83 %
RV: 1.92 L
TLC % pred: 82 %
TLC: 5.75 L

## 2020-06-06 NOTE — Progress Notes (Signed)
PFT done today. 

## 2020-06-20 ENCOUNTER — Telehealth: Payer: Self-pay | Admitting: Emergency Medicine

## 2020-06-20 NOTE — Telephone Encounter (Signed)
Xolair Order: # vials: 2 Ordered date: 06/20/20 Expected date of arrival: 06/22/20 Ordered by: [place]: [facility]

## 2020-06-22 NOTE — Telephone Encounter (Signed)
Xolair Received: # Vials: 2 Medication arrival date: 06/22/20 Lot #: [id number] Exp date: 11/29/2023 Received by: Elliot Dally

## 2020-06-30 ENCOUNTER — Ambulatory Visit: Payer: No Typology Code available for payment source

## 2020-06-30 DIAGNOSIS — G4733 Obstructive sleep apnea (adult) (pediatric): Secondary | ICD-10-CM

## 2020-07-01 NOTE — Telephone Encounter (Signed)
This message was routed to the infusion pool, Jose Liu is aware of this message, states he missed his appointment on yesterday and she will call him to schedule another appointment.  Nothing further needed.

## 2020-07-05 ENCOUNTER — Other Ambulatory Visit: Payer: Self-pay | Admitting: Nurse Practitioner

## 2020-07-05 DIAGNOSIS — L989 Disorder of the skin and subcutaneous tissue, unspecified: Secondary | ICD-10-CM

## 2020-07-08 ENCOUNTER — Ambulatory Visit (INDEPENDENT_AMBULATORY_CARE_PROVIDER_SITE_OTHER): Payer: No Typology Code available for payment source

## 2020-07-08 ENCOUNTER — Other Ambulatory Visit: Payer: Self-pay

## 2020-07-08 DIAGNOSIS — J4520 Mild intermittent asthma, uncomplicated: Secondary | ICD-10-CM | POA: Diagnosis not present

## 2020-07-08 MED ORDER — OMALIZUMAB 150 MG ~~LOC~~ SOLR
300.0000 mg | Freq: Once | SUBCUTANEOUS | Status: AC
  Administered 2020-07-08: 300 mg via SUBCUTANEOUS

## 2020-07-08 NOTE — Progress Notes (Signed)
Have you been hospitalized within the last 10 days?  No Do you have a fever?  No Do you have a cough?  No Do you have a headache or sore throat? No Do you have your Epi Pen visible and is it within date?  Yes 

## 2020-07-08 NOTE — Telephone Encounter (Signed)
Patient brought a copy a copy of his sleep study to office today.  [place] [employer] placed in Dr [employer].   Message routed to [facility] to follow up

## 2020-07-14 NOTE — Telephone Encounter (Signed)
I have his sleep study available here. Please set him up with DME that he prefers to get scheduled supplies. Please get PSG from me to send to DME, then scan it into our system

## 2020-07-18 ENCOUNTER — Telehealth: Payer: Self-pay | Admitting: Emergency Medicine

## 2020-07-18 ENCOUNTER — Ambulatory Visit: Payer: No Typology Code available for payment source | Admitting: Emergency Medicine

## 2020-07-18 NOTE — Telephone Encounter (Signed)
Response from [facility] for DME order placed on 9/17:  Miquel Dunn sent to Harland Dingwall, [place]; Earline Mayotte, Georges Mouse [place],   We have received your order, however we are in need of the following to process this to the patients insurance:  1. Copy of sleep study  Reason: This will be a new supply patient to [place] and we are missing a sleep study. Not one in [facility] and MD has made note that they do not have one either, it appears to have been done in [place]. Can you alert them we cannot process supplies to their insurance without that missing piece?  Once we received this information we will be able ot continue to process this request.  Thank you!  Leroy Sea

## 2020-07-18 NOTE — Telephone Encounter (Signed)
Pt brought the study by and it was scanned in last wk by [facility]. Can you help [place] locate this in the pt's chart?

## 2020-07-18 NOTE — Telephone Encounter (Signed)
I saw the sleep study in pt's chart under Media.  I have sent a message back to [facility] to see if they are able to locate the sleep study.

## 2020-07-26 ENCOUNTER — Other Ambulatory Visit: Payer: Self-pay | Admitting: *Deleted

## 2020-07-26 MED ORDER — XOLAIR 150 MG ~~LOC~~ SOLR: 300.0000 mg | SUBCUTANEOUS | 11 refills | Status: DC

## 2020-08-01 ENCOUNTER — Telehealth: Payer: Self-pay | Admitting: Emergency Medicine

## 2020-08-01 NOTE — Telephone Encounter (Signed)
Xolair Order: # vials: 2 Ordered date: 08/01/20 Expected date of arrival: 08/04/20 Ordered by: [place]: [facility]

## 2020-08-04 NOTE — Telephone Encounter (Signed)
Xolair Received: # Vials: 2 Medication arrival date: 08/04/20 Lot #: [id number] Exp date: 01/27/2024 Received by: Elliot Dally

## 2020-08-08 ENCOUNTER — Other Ambulatory Visit: Payer: Self-pay

## 2020-08-08 ENCOUNTER — Ambulatory Visit (INDEPENDENT_AMBULATORY_CARE_PROVIDER_SITE_OTHER): Payer: No Typology Code available for payment source

## 2020-08-08 DIAGNOSIS — J4520 Mild intermittent asthma, uncomplicated: Secondary | ICD-10-CM | POA: Diagnosis not present

## 2020-08-08 MED ORDER — OMALIZUMAB 150 MG ~~LOC~~ SOLR: 300.0000 mg | SUBCUTANEOUS | Status: DC

## 2020-08-08 MED ORDER — OMALIZUMAB 150 MG ~~LOC~~ SOLR
300.0000 mg | Freq: Once | SUBCUTANEOUS | Status: AC
  Administered 2020-08-08: 300 mg via SUBCUTANEOUS

## 2020-08-08 NOTE — Progress Notes (Signed)
Have you been hospitalized within the last 10 days?  No Do you have a fever?  No Do you have a cough?  No Do you have a headache or sore throat? No Do you have your Epi Pen visible and is it within date?  Yes 

## 2020-08-22 ENCOUNTER — Telehealth: Payer: Self-pay | Admitting: Emergency Medicine

## 2020-08-22 NOTE — Telephone Encounter (Signed)
See other encounter. Patient rescheduled.

## 2020-08-22 NOTE — Telephone Encounter (Signed)
Xolair Order: # vials: 2 Ordered date: 08/22/20 Expected date of arrival: 08/30/20 Ordered by: [place]: [facility]

## 2020-08-30 NOTE — Telephone Encounter (Signed)
Xolair Received: # Vials: 2 Medication arrival date: 08/30/20 Lot #: [id number] Exp date: 01/27/2024 Received by: Elliot Dally

## 2020-09-02 ENCOUNTER — Ambulatory Visit (INDEPENDENT_AMBULATORY_CARE_PROVIDER_SITE_OTHER): Payer: No Typology Code available for payment source

## 2020-09-02 ENCOUNTER — Other Ambulatory Visit: Payer: Self-pay

## 2020-09-02 DIAGNOSIS — J4520 Mild intermittent asthma, uncomplicated: Secondary | ICD-10-CM

## 2020-09-02 MED ORDER — OMALIZUMAB 150 MG ~~LOC~~ SOLR
300.0000 mg | Freq: Once | SUBCUTANEOUS | Status: AC
  Administered 2020-09-02: 300 mg via SUBCUTANEOUS

## 2020-09-02 NOTE — Progress Notes (Signed)
Have you been hospitalized within the last 10 days?  No Do you have a fever?  No Do you have a cough?  No Do you have a headache or sore throat? No Do you have your Epi Pen visible and is it within date?  Yes 

## 2020-09-05 ENCOUNTER — Ambulatory Visit: Payer: No Typology Code available for payment source

## 2020-09-19 ENCOUNTER — Telehealth: Payer: Self-pay | Admitting: Emergency Medicine

## 2020-09-19 NOTE — Telephone Encounter (Signed)
Fax received from [facility] to order next shipment of Xolair vials. Xolair 300mg  every 4 weeks. 2 Xolair 150mg  vials requested for 09/28/20 office delivery.

## 2020-09-26 ENCOUNTER — Telehealth: Payer: Self-pay | Admitting: Emergency Medicine

## 2020-09-26 NOTE — Telephone Encounter (Signed)
Spoke with [facility].  Xolair is scheduled for 09/28/20 delivery.

## 2020-09-28 NOTE — Telephone Encounter (Signed)
Xolair Received: # Vials: 2 Medication arrival date: 09/28/20 Lot #: [id number] Exp date: 01/27/2024 Received by: Elliot Dally

## 2020-09-29 ENCOUNTER — Other Ambulatory Visit: Payer: Self-pay | Admitting: Nurse Practitioner

## 2020-09-29 NOTE — Telephone Encounter (Signed)
   Notes to clinic Historical Provider  

## 2020-09-30 NOTE — Telephone Encounter (Signed)
Routing to provider  

## 2020-10-03 ENCOUNTER — Other Ambulatory Visit: Payer: Self-pay

## 2020-10-03 ENCOUNTER — Ambulatory Visit (INDEPENDENT_AMBULATORY_CARE_PROVIDER_SITE_OTHER): Payer: No Typology Code available for payment source

## 2020-10-03 DIAGNOSIS — J4520 Mild intermittent asthma, uncomplicated: Secondary | ICD-10-CM

## 2020-10-03 MED ORDER — OMALIZUMAB 150 MG ~~LOC~~ SOLR
300.0000 mg | Freq: Once | SUBCUTANEOUS | Status: AC
  Administered 2020-10-03: 300 mg via SUBCUTANEOUS

## 2020-10-03 NOTE — Progress Notes (Signed)
Have you been hospitalized within the last 10 days?  No Do you have a fever?  No Do you have a cough?  No Do you have a headache or sore throat? No Do you have your Epi Pen visible and is it within date?  Yes 

## 2020-10-17 ENCOUNTER — Telehealth: Payer: Self-pay | Admitting: Emergency Medicine

## 2020-10-17 NOTE — Telephone Encounter (Signed)
Xolair Order: # vials: 2 Ordered date: 10/17/20 Expected date of arrival: 10/18/20 Ordered by: [place]: [facility]

## 2020-10-18 NOTE — Telephone Encounter (Signed)
Xolair Received: # Vials: 2 Medication arrival date: 10/18/20 Lot #: [id number] Exp date: 05/27/2024 Received by: Elliot Dally

## 2020-10-31 ENCOUNTER — Ambulatory Visit: Payer: No Typology Code available for payment source

## 2020-11-04 ENCOUNTER — Ambulatory Visit: Payer: No Typology Code available for payment source | Admitting: Emergency Medicine

## 2020-11-04 ENCOUNTER — Other Ambulatory Visit: Payer: Self-pay

## 2020-11-04 ENCOUNTER — Encounter: Payer: Self-pay | Admitting: Emergency Medicine

## 2020-11-04 ENCOUNTER — Ambulatory Visit: Payer: No Typology Code available for payment source

## 2020-11-04 VITALS — BP 128/80 | HR 88 | Temp 98.7°F | Ht 70.5 in | Wt 325.0 lb

## 2020-11-04 DIAGNOSIS — J4520 Mild intermittent asthma, uncomplicated: Secondary | ICD-10-CM

## 2020-11-04 DIAGNOSIS — J3081 Allergic rhinitis due to animal (cat) (dog) hair and dander: Secondary | ICD-10-CM | POA: Diagnosis not present

## 2020-11-04 DIAGNOSIS — G4733 Obstructive sleep apnea (adult) (pediatric): Secondary | ICD-10-CM | POA: Diagnosis not present

## 2020-11-04 MED ORDER — OMALIZUMAB 150 MG ~~LOC~~ SOLR
300.0000 mg | Freq: Once | SUBCUTANEOUS | Status: AC
  Administered 2020-11-04: 300 mg via SUBCUTANEOUS

## 2020-11-04 NOTE — Assessment & Plan Note (Signed)
He confirms good clinical benefit.  His compliance is fantastic, 100% of the nights over the last 3 months used for greater than 4 hours.  Plan to continue same

## 2020-11-04 NOTE — Patient Instructions (Addendum)
We will continue your Xolair on your current schedule. Please continue your Singulair 10 mg each evening Continue loratadine 10 mg once daily Continue your fluticasone nasal spray 2 sprays each nostril once daily. Continue to wear your CPAP reliably every night. Follow with Dr. Lamonte Sakai in 12 months or sooner if you have any problems.

## 2020-11-04 NOTE — Progress Notes (Signed)
Subjective:    Patient ID: Jose Liu, male    DOB: 1956-02-24, 65 y.o.   MRN: [id number]  HPI 65 year old gentleman, never smoker, recently moved from [place] to [place].  He has a history of severe allergies with reported elevated IgE, previously on Xolair with good control. Reports that he has also been given dx of asthma.  He has been on targeted immunotherapy in the past without much response - multiple grasses, trees, mites, ragweed, cats, soy, honey.  Also with a history of hypertension on an ACE inhibitor, GERD. He has been off the Xolair for the last 3 months since the move, increased cough, clear mucous. Never has required steroids.   Maintenance meds include Singulair, fluticasone nasal spray.  Also on pantoprazole and Pepcid for his reflux.  He has albuterol which he uses approximately once a month. He was on Advair in the past, stopped many years ago.  He refuses the flu shot, feels it makes him sick  He believes he was receiving 300mg  every 4 weeks  ROV 11/04/20 --65 year old gentleman who follows up today for his history of severe allergic disease with associated asthma.  Also with hypertension on ACE inhibitor, GERD.  We got him restarted on his Xolair about 1 year ago. No wheeze, coughs in the am but then better through the day.  He is on Singulair, fluticasone nasal spray, pantoprazole and Pepcid. He has albuterol, has not needed to use it for months.  He got a new CPAP machine, has good compliance with it >> documented 100% usage for > 4 hours over the last 90 days. Good clinical response.    Review of Systems  Constitutional: Negative for fever and unexpected weight change.  HENT: Negative for congestion, dental problem, ear pain, nosebleeds, postnasal drip, rhinorrhea, sinus pressure, sneezing, sore throat and trouble swallowing.   Eyes: Negative for redness and itching.  Respiratory: Negative for cough, chest tightness, shortness of breath and wheezing.    Cardiovascular: Negative for palpitations and leg swelling.  Gastrointestinal: Negative for nausea and vomiting.  Genitourinary: Negative for dysuria.  Musculoskeletal: Negative for joint swelling.  Skin: Negative for rash.  Allergic/Immunologic: Positive for environmental allergies and food allergies. Negative for immunocompromised state.  Neurological: Negative for headaches.  Hematological: Does not bruise/bleed easily.  Psychiatric/Behavioral: Negative for dysphoric mood. The patient is not nervous/anxious.     Past Medical History:  Diagnosis Date  . Allergies   . Asthma   . GERD (gastroesophageal reflux disease)   . Hypertension   . Situs inversus      Family History  Problem Relation Age of Onset  . CAD Mother        quadruple CABG  . Stroke Mother   . Hypertension Mother   . Dementia Father   . Prostate cancer Father      Social History   Socioeconomic History  . Marital status: Married    Spouse name: Not on file  . Number of children: Not on file  . Years of education: Not on file  . Highest education level: Not on file  Occupational History  . Not on file  Tobacco Use  . Smoking status: Never Smoker  . Smokeless tobacco: Never Used  Vaping Use  . Vaping Use: Never used  Substance and Sexual Activity  . Alcohol use: Yes    Comment: on rare occasion  . Drug use: Never  . Sexual activity: Not on file  Other Topics Concern  . Not  on file  Social History Narrative  . Not on file   Social Determinants of Health   Financial Resource Strain: Low Risk   . Difficulty of Paying Living Expenses: Not hard at all  Food Insecurity: No Food Insecurity  . Worried About Programme researcher, broadcasting/film/video in the Last Year: Never true  . Ran Out of Food in the Last Year: Never true  Transportation Needs: No Transportation Needs  . Lack of Transportation (Medical): No  . Lack of Transportation (Non-Medical): No  Physical Activity: Inactive  . Days of Exercise per Week: 0  days  . Minutes of Exercise per Session: 0 min  Stress: No Stress Concern Present  . Feeling of Stress : Not at all  Social Connections: Moderately Integrated  . Frequency of Communication with Friends and Family: Three times a week  . Frequency of Social Gatherings with Friends and Family: Three times a week  . Attends Religious Services: Never  . Active Member of Clubs or Organizations: Yes  . Attends Banker Meetings: More than 4 times per year  . 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 Dust exposure Was a fuel truck driver, worked on a fruit farm remotely No [employer]  Allergies  Allergen Reactions  . Honey Bee Treatment [Bee Venom]     emesis  . Soy Allergy     cramps     Outpatient Medications Prior to Visit  Medication Sig Dispense Refill  . albuterol (VENTOLIN HFA) 108 (90 Base) MCG/ACT inhaler Inhale 1 puff into the lungs every 6 (six) hours as needed.    Marland Kitchen EPINEPHrine 0.3 mg/0.3 mL IJ SOAJ injection Inject 0.3 mg into the muscle as needed for anaphylaxis.    . famotidine (PEPCID) 40 MG tablet Take 1 tablet (40 mg total) by mouth daily. 90 tablet 3  . fluticasone (FLONASE) 50 MCG/ACT nasal spray USE 2 SPRAYS EACH NOSTRIL EVERY DAY 48 mL 2  . gabapentin (NEURONTIN) 300 MG capsule Take 1 capsule (300 mg total) by mouth at bedtime. 90 capsule 3  . lisinopril-hydrochlorothiazide (ZESTORETIC) 10-12.5 MG tablet Take 1 tablet by mouth daily. 90 tablet 3  . montelukast (SINGULAIR) 10 MG tablet Take 1 tablet (10 mg total) by mouth at bedtime. 90 tablet 3  . pantoprazole (PROTONIX) 40 MG tablet Take 1 tablet (40 mg total) by mouth daily. 90 tablet 3  . XOLAIR 150 MG injection Inject 300 mg into the skin every 28 (twenty-eight) days. 2 each 11  . omeprazole (PRILOSEC) 20 MG capsule Take 20 mg by mouth daily.    Marland Kitchen UNABLE TO FIND Xolair injection monthly for allergies      Facility-Administered Medications Prior to Visit  Medication Dose Route Frequency Provider Last Rate Last Admin  . omalizumab Jose Paradise) injection 300 mg  300 mg Subcutaneous Q14 Days Leslye Peer, MD   300 mg at 11/19/19 1446  . omalizumab Jose Paradise) injection 300 mg  300 mg Subcutaneous Q14 Days Leslye Peer, MD   300 mg at 02/11/20 1112  . omalizumab Jose Paradise) injection 300 mg  300 mg Subcutaneous Q14 Days Leslye Peer, MD   300 mg at 04/04/20 1113  . omalizumab Jose Paradise) injection 300 mg  300 mg Subcutaneous Q28 days Leslye Peer, MD   300 mg at 06/02/20 1126        Objective:   Physical Exam Today's Vitals  11/04/20 1045  BP: 128/80  Pulse: 88  Temp: 98.7 F (37.1 C)  TempSrc: Temporal  SpO2: 98%  Weight: (!) 325 lb (147.4 kg)  Height: 5' 10.5" (1.791 m)   Body mass index is 45.97 kg/m.;s Gen: Pleasant, obese man, in no distress,  normal affect  ENT: No lesions,  mouth clear,  oropharynx clear, no postnasal drip  Neck: No JVD, no stridor  Lungs: No use of accessory muscles, no crackles or wheezing on normal respiration, no wheeze on forced expiration  Cardiovascular: RRR, heart sounds normal, no murmur or gallops, trace pretibial peripheral edema  Musculoskeletal: No deformities, no cyanosis or clubbing  Neuro: alert, awake, non focal  Skin: Warm, no lesions or rash     Assessment & Plan:  Asthma Significant benefit from initiation of Xolair.  Rare albuterol use.  Continue same regimen Follow with Dr. Lamonte Sakai in 12 months or sooner if you have any problems.   Allergic rhinitis We will continue your Xolair on your current schedule. Please continue your Singulair 10 mg each evening Continue loratadine 10 mg once daily Continue your fluticasone nasal spray 2 sprays each nostril once daily.  OSA (obstructive sleep apnea) He confirms good clinical benefit.  His compliance is fantastic, 100% of the nights over the last 3 months used for greater than 4  hours.  Plan to continue same   Baltazar Apo, MD, PhD 11/04/2020, 3:11 PM [facility] [phone] or if no answer [phone]

## 2020-11-04 NOTE — Assessment & Plan Note (Signed)
We will continue your Xolair on your current schedule. Please continue your Singulair 10 mg each evening Continue loratadine 10 mg once daily Continue your fluticasone nasal spray 2 sprays each nostril once daily.

## 2020-11-04 NOTE — Assessment & Plan Note (Signed)
Significant benefit from initiation of Xolair.  Rare albuterol use.  Continue same regimen Follow with Dr. Lamonte Sakai in 12 months or sooner if you have any problems.

## 2020-11-08 ENCOUNTER — Telehealth: Payer: Self-pay | Admitting: Emergency Medicine

## 2020-11-08 LAB — HEMOGLOBIN A1C
A1C %: 7.5 %
Hemoglobin A1C: 7.5 %

## 2020-11-08 NOTE — Telephone Encounter (Signed)
Xolair Order: # vials: 2 Ordered date: 11/08/20 Expected date of arrival: 12/01/20 Ordered by: Genesee Nase,LPN via fax Specialty Pharmacy: [facility]

## 2020-11-24 ENCOUNTER — Other Ambulatory Visit: Payer: Self-pay

## 2020-11-24 ENCOUNTER — Ambulatory Visit: Payer: No Typology Code available for payment source | Admitting: Nurse Practitioner

## 2020-11-24 ENCOUNTER — Encounter: Payer: Self-pay | Admitting: Nurse Practitioner

## 2020-11-24 DIAGNOSIS — M79675 Pain in left toe(s): Secondary | ICD-10-CM

## 2020-11-24 DIAGNOSIS — G5603 Carpal tunnel syndrome, bilateral upper limbs: Secondary | ICD-10-CM

## 2020-11-24 DIAGNOSIS — G5600 Carpal tunnel syndrome, unspecified upper limb: Secondary | ICD-10-CM | POA: Insufficient documentation

## 2020-11-24 MED ORDER — GABAPENTIN 300 MG PO CAPS: 300.0000 mg | ORAL_CAPSULE | Freq: Two times a day (BID) | ORAL | 4 refills | Status: DC

## 2020-11-24 NOTE — Assessment & Plan Note (Signed)
BMI 47.56.  Recommended eating smaller high protein, low fat meals more frequently and exercising 30 mins a day 5 times a week with a goal of 10-15lb weight loss in the next 3 months. Patient voiced their understanding and motivation to adhere to these recommendations.

## 2020-11-24 NOTE — Patient Instructions (Signed)
Osteoarthritis  Osteoarthritis is a type of arthritis. It refers to joint pain or joint disease. Osteoarthritis affects tissue that covers the ends of bones in joints (cartilage). Cartilage acts as a cushion between the bones and helps them move smoothly. Osteoarthritis occurs when cartilage in the joints gets worn down. Osteoarthritis is sometimes called "wear and tear" arthritis. Osteoarthritis is the most common form of arthritis. It often occurs in older people. It is a condition that gets worse over time. The joints most often affected by this condition are in the fingers, toes, hips, knees, and spine, including the neck and lower back. What are the causes? This condition is caused by the wearing down of cartilage that covers the ends of bones. What increases the risk? The following factors may make you more likely to develop this condition:  Being age 50 or older.  Obesity.  Overuse of joints.  Past injury of a joint.  Past surgery on a joint.  Family history of osteoarthritis. What are the signs or symptoms? The main symptoms of this condition are pain, swelling, and stiffness in the joint. Other symptoms may include:  An enlarged joint.  More pain and further damage caused by small pieces of bone or cartilage that break off and float inside of the joint.  Small deposits of bone (osteophytes) that grow on the edges of the joint.  A grating or scraping feeling inside the joint when you move it.  Popping or creaking sounds when you move.  Difficulty walking or exercising.  An inability to grip items, twist your hand(s), or control the movements of your hands and fingers. How is this diagnosed? This condition may be diagnosed based on:  Your medical history.  A physical exam.  Your symptoms.  X-rays of the affected joint(s).  Blood tests to rule out other types of arthritis. How is this treated? There is no cure for this condition, but treatment can help control  pain and improve joint function. Treatment may include a combination of therapies, such as:  Pain relief techniques, such as: ? Applying heat and cold to the joint. ? Massage. ? A form of talk therapy called cognitive behavioral therapy (CBT). This therapy helps you set goals and follow up on the changes that you make.  Medicines for pain and inflammation. The medicines can be taken by mouth or applied to the skin. They include: ? NSAIDs, such as ibuprofen. ? Prescription medicines. ? Strong anti-inflammatory medicines (corticosteroids). ? Certain nutritional supplements.  A prescribed exercise program. You may work with a physical therapist.  Assistive devices, such as a brace, wrap, splint, specialized glove, or cane.  A weight control plan.  Surgery, such as: ? An osteotomy. This is done to reposition the bones and relieve pain or to remove loose pieces of bone and cartilage. ? Joint replacement surgery. You may need this surgery if you have advanced osteoarthritis. Follow these instructions at home: Activity  Rest your affected joints as told by your health care provider.  Exercise as told by your health care provider. He or she may recommend specific types of exercise, such as: ? Strengthening exercises. These are done to strengthen the muscles that support joints affected by arthritis. ? Aerobic activities. These are exercises, such as brisk walking or water aerobics, that increase your heart rate. ? Range-of-motion activities. These help your joints move more easily. ? Balance and agility exercises. Managing pain, stiffness, and swelling  If directed, apply heat to the affected area as often   as told by your health care provider. Use the heat source that your health care provider recommends, such as a moist heat pack or a heating pad. ? If you have a removable assistive device, remove it as told by your health care provider. ? Place a towel between your skin and the heat  source. If your health care provider tells you to keep the assistive device on while you apply heat, place a towel between the assistive device and the heat source. ? Leave the heat on for 20-30 minutes. ? Remove the heat if your skin turns bright red. This is especially important if you are unable to feel pain, heat, or cold. You may have a greater risk of getting burned.  If directed, put ice on the affected area. To do this: ? If you have a removable assistive device, remove it as told by your health care provider. ? Put ice in a plastic bag. ? Place a towel between your skin and the bag. If your health care provider tells you to keep the assistive device on during icing, place a towel between the assistive device and the bag. ? Leave the ice on for 20 minutes, 2-3 times a day. ? Move your fingers or toes often to reduce stiffness and swelling. ? Raise (elevate) the injured area above the level of your heart while you are sitting or lying down.      General instructions  Take over-the-counter and prescription medicines only as told by your health care provider.  Maintain a healthy weight. Follow instructions from your health care provider for weight control.  Do not use any products that contain nicotine or tobacco, such as cigarettes, e-cigarettes, and chewing tobacco. If you need help quitting, ask your health care provider.  Use assistive devices as told by your health care provider.  Keep all follow-up visits as told by your health care provider. This is important. Where to find more information  National Institute of Arthritis and Musculoskeletal and Skin Diseases: www.niams.nih.gov  National Institute on Aging: www.nia.nih.gov  American College of Rheumatology: www.rheumatology.org Contact a health care provider if:  You have redness, swelling, or a feeling of warmth in a joint that gets worse.  You have a fever along with joint or muscle aches.  You develop a  rash.  You have trouble doing your normal activities. Get help right away if:  You have pain that gets worse and is not relieved by pain medicine. Summary  Osteoarthritis is a type of arthritis that affects tissue covering the ends of bones in joints (cartilage).  This condition is caused by the wearing down of cartilage that covers the ends of bones.  The main symptom of this condition is pain, swelling, and stiffness in the joint.  There is no cure for this condition, but treatment can help control pain and improve joint function. This information is not intended to replace advice given to you by your health care provider. Make sure you discuss any questions you have with your health care provider. Document Revised: 10/12/2019 Document Reviewed: 10/12/2019 Elsevier Patient Education  2021 Elsevier Inc.  

## 2020-11-24 NOTE — Assessment & Plan Note (Signed)
To left 2nd toe for past few months.  Nail appears slightly embedded into skin on toe.  Toenails thickened bilaterally.  No pain on exam today and no s/s infection.  Will send to podiatry for further recommendations.

## 2020-11-24 NOTE — Progress Notes (Signed)
BP 119/75   Pulse 78   Temp 98.9 F (37.2 C) (Oral)   Ht 5' 10.08" (1.78 m)   Wt (!) 332 lb 3.2 oz (150.7 kg)   SpO2 97%   BMI 47.56 kg/m    Subjective:    Patient ID: Jose Liu, male    DOB: Aug 11, 1956, 65 y.o.   MRN: [id number]  HPI: Jose Liu is a 65 y.o. male  Chief Complaint  Patient presents with  . Toe Pain    2nd toe on the left foot, Has been little painful, and the toe nail is starting to grow up   . B/L Arm numbness    Has been having issue when he holds his arms up or tries to hold his wife  . Hand Pain    Pain in the base of right thumb for the past 2 months. And the Left middle finger has been painful and if finding it hard to hold on to stuff at night   TOE PAIN Presents with toe pain on 2nd toe left foot.  Has been present for a few months with callus and then last month or so has had discomfort to toe. When anything touches it causes discomfort.   Duration: chronic Involved toe: left 2nd toe Mechanism of injury: unknown Location: from the nail up Onset: sudden  Severity: 3/10  Quality:  dull and aching Frequency: intermittent Radiation: no Aggravating factors: movement -- can walk and sit up at home without discomfort -- more noted when sitting and moving toes around Alleviating factors: nothing Status: fluctuating Treatments attempted: foot cream Relief with NSAIDs?:  moderate Weakness with weight bearing or walking: no Morning stiffness: no Swelling: no Redness: no Bruising: no Paresthesias / decreased sensation: no  Fevers:no   ARTHRALGIAS / JOINT ACHES Reports having arm numbness bilaterally and hand pain.  When driving has to switch hands all the time because hand will tingle.  When sleeping will wake up with discomfort.  Laying arms flat when sleeping does not have any issues.  In past was told he has he had neck issues and has had carpal tunnel to right side in past.  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  Has had  steroid shots in past, which caused him multiple side effects.  He does endorse benefit from Gabapentin and would like to try twice a day -- only he would like to only take second dose as needed if he knows he is going to do a lot of work during daytime. Duration: months Pain: yes Symmetric: yes  moderate Quality: dull and burning Frequency: intermittent Context:  fluctuating Decreased function/range of motion: yes Erythema: none Swelling: no Heat or warmth: no Morning stiffness: no Aggravating factors: laying down Alleviating factors: activity Relief with NSAIDs?: moderate Treatments attempted: Ibuprofen Involved Joints:     Hands: no bilateral    Wrists: yes bilateral     Elbows: no none    Shoulders: yes bilateral    Back: no     Hips: no none    Knees: none    Ankles: none    Feet: none  Relevant past medical, surgical, family and social history reviewed and updated as indicated. Interim medical history since our last visit reviewed. Allergies and medications reviewed and updated.  Review of Systems  Constitutional: Negative for activity change, diaphoresis, fatigue and fever.  Respiratory: Negative for cough, chest tightness, shortness of breath and wheezing.   Cardiovascular: Negative for chest pain, palpitations  and leg swelling.  Gastrointestinal: Negative.   Musculoskeletal: Positive for arthralgias.  Neurological: Positive for numbness. Negative for dizziness, syncope, weakness and headaches.  Psychiatric/Behavioral: Negative.     Per HPI unless specifically indicated above     Objective:    BP 119/75   Pulse 78   Temp 98.9 F (37.2 C) (Oral)   Ht 5' 10.08" (1.78 m)   Wt (!) 332 lb 3.2 oz (150.7 kg)   SpO2 97%   BMI 47.56 kg/m   Wt Readings from Last 3 Encounters:  11/24/20 (!) 332 lb 3.2 oz (150.7 kg)  11/04/20 (!) 325 lb (147.4 kg)  05/13/20 (!) 322 lb 12.8 oz (146.4 kg)    Physical Exam Vitals and nursing note reviewed.  Constitutional:       General: He is awake. He is not in acute distress.    Appearance: He is well-developed and well-groomed. He is morbidly obese. He is not ill-appearing.  HENT:     Head: Normocephalic and atraumatic.     Right Ear: Hearing normal. No drainage.     Left Ear: Hearing normal. No drainage.  Eyes:     General: Lids are normal.        Right eye: No discharge.        Left eye: No discharge.     Conjunctiva/sclera: Conjunctivae normal.     Pupils: Pupils are equal, round, and reactive to light.  Neck:     Thyroid: No thyromegaly.     Vascular: No carotid bruit.     Trachea: Trachea normal.  Cardiovascular:     Rate and Rhythm: Normal rate and regular rhythm.     Pulses:          Dorsalis pedis pulses are 2+ on the right side and 2+ on the left side.       Posterior tibial pulses are 2+ on the right side and 2+ on the left side.     Heart sounds: Normal heart sounds, S1 normal and S2 normal. No murmur heard. No gallop.   Pulmonary:     Effort: Pulmonary effort is normal. No accessory muscle usage or respiratory distress.     Breath sounds: Normal breath sounds.  Abdominal:     General: Bowel sounds are normal.     Palpations: Abdomen is soft.  Musculoskeletal:        General: Normal range of motion.     Right upper arm: Normal.     Left upper arm: Normal.     Right hand: No swelling or tenderness. Normal range of motion. Normal strength. Normal sensation. Normal pulse.     Left hand: No swelling or tenderness. Normal range of motion. Normal strength. Normal sensation. Normal pulse.     Cervical back: Normal range of motion and neck supple.     Right lower leg: No edema.     Left lower leg: No edema.     Right foot: Normal range of motion.     Left foot: Normal range of motion.       Feet:     Comments: Positive Phalen R>L.  Negative Tine bilaterally.  No rashes noted.    Feet:     Right foot:     Protective Sensation: 10 sites tested. 10 sites sensed.     Skin integrity: Skin  integrity normal.     Toenail Condition: Right toenails are abnormally thick.     Left foot:     Protective Sensation: 10 sites  tested. 10 sites sensed.     Skin integrity: Skin integrity normal.     Toenail Condition: Left toenails are abnormally thick.  Skin:    General: Skin is warm and dry.     Capillary Refill: Capillary refill takes less than 2 seconds.     Findings: No rash.  Neurological:     Mental Status: He is alert and oriented to person, place, and time.     Deep Tendon Reflexes: Reflexes are normal and symmetric.     Reflex Scores:      Brachioradialis reflexes are 2+ on the right side and 2+ on the left side.      Patellar reflexes are 2+ on the right side and 2+ on the left side. Psychiatric:        Attention and Perception: Attention normal.        Mood and Affect: Mood normal.        Speech: Speech normal.        Behavior: Behavior normal. Behavior is cooperative.        Thought Content: Thought content normal.     Results for orders placed or performed in visit on 06/06/20  PFT  Result Value Ref Range   FVC-Pre 3.84 L   FVC-%Pred-Pre 81 %   FVC-Post 3.98 L   FVC-%Pred-Post 84 %   FVC-%Change-Post 3 %   FEV1-Pre 3.08 L   FEV1-%Pred-Pre 86 %   FEV1-Post 3.13 L   FEV1-%Pred-Post 88 %   FEV1-%Change-Post 1 %   FEV6-Pre 3.84 L   FEV6-%Pred-Pre 85 %   FEV6-Post 3.98 L   FEV6-%Pred-Post 88 %   FEV6-%Change-Post 3 %   Pre FEV1/FVC ratio 80 %   FEV1FVC-%Pred-Pre 106 %   Post FEV1/FVC ratio 79 %   FEV1FVC-%Change-Post -1 %   Pre FEV6/FVC Ratio 100 %   FEV6FVC-%Pred-Pre 105 %   Post FEV6/FVC ratio 100 %   FEV6FVC-%Pred-Post 105 %   FEF 25-75 Pre 2.81 L/sec   FEF2575-%Pred-Pre 97 %   FEF 25-75 Post 2.96 L/sec   FEF2575-%Pred-Post 102 %   FEF2575-%Change-Post 5 %   RV 1.92 L   RV % pred 83 %   TLC 5.75 L   TLC % pred 82 %   DLCO unc 32.94 ml/min/mmHg   DLCO unc % pred 120 %   DLCO cor 32.94 ml/min/mmHg   DLCO cor % pred 120 %   DL/VA 5.56  ml/min/mmHg/L   DL/VA % pred 132 %      Assessment & Plan:   Problem List Items Addressed This Visit      Nervous and Auditory   Carpal tunnel syndrome    R>L, has history of similar.  Wore brace in past.  Does not wish to pursue ortho or injections at this time.  Will trial increase in Gabapentin to twice daily dosing, 300 MG, new script sent.  Recommend he wear brace at night.  If ongoing consider referral and testing OR referral to PT for therapy sessions.  Return in April for physical.      Relevant Medications   gabapentin (NEURONTIN) 300 MG capsule     Other   Morbid obesity ([place]) - Primary    BMI 47.56.  Recommended eating smaller high protein, low fat meals more frequently and exercising 30 mins a day 5 times a week with a goal of 10-15lb weight loss in the next 3 months. Patient voiced their understanding and motivation to adhere to these recommendations.  Pain of toe of left foot    To left 2nd toe for past few months.  Nail appears slightly embedded into skin on toe.  Toenails thickened bilaterally.  No pain on exam today and no s/s infection.  Will send to podiatry for further recommendations.      Relevant Orders   Ambulatory referral to Podiatry       Follow up plan: Return in about 3 months (around 02/22/2021) for Annual physical .

## 2020-11-24 NOTE — Assessment & Plan Note (Signed)
R>L, has history of similar.  Wore brace in past.  Does not wish to pursue ortho or injections at this time.  Will trial increase in Gabapentin to twice daily dosing, 300 MG, new script sent.  Recommend he wear brace at night.  If ongoing consider referral and testing OR referral to PT for therapy sessions.  Return in April for physical.

## 2020-11-30 ENCOUNTER — Ambulatory Visit (INDEPENDENT_AMBULATORY_CARE_PROVIDER_SITE_OTHER): Payer: No Typology Code available for payment source | Admitting: Podiatry

## 2020-11-30 ENCOUNTER — Other Ambulatory Visit: Payer: Self-pay

## 2020-11-30 ENCOUNTER — Encounter: Payer: Self-pay | Admitting: Podiatry

## 2020-11-30 DIAGNOSIS — L603 Nail dystrophy: Secondary | ICD-10-CM

## 2020-11-30 NOTE — Progress Notes (Signed)
  Subjective:  Patient ID: Jose Liu, male    DOB: Aug 12, 1956,  MRN: [id number]  Chief Complaint  Patient presents with  . Nail Problem  . Callouses    65 y.o. male presents with the above complaint. History confirmed with patient.  This is most concerning on the second toe on the left foot.  His father had removed nail fungus and he is concerned that he may be getting this.  He has a callus on the tip of the toes.  He does wear longer shoes and shoes with wide toe boxes.  Objective:  Physical Exam: warm, good capillary refill, no trophic changes or ulcerative lesions, normal DP and PT pulses and normal sensory exam. Left Foot: Second toe dystrophic nail with callus at tip of toe  Assessment:   1. Nail dystrophy      Plan:  Patient was evaluated and treated and all questions answered.  Discussed with him that the changes in the nail and the callus at the tip seem to be primarily mechanical but we should rule out nail fungus.  A culture of the nail plate was taken and sent to [facility] pathology.  We will treat with an antifungal if it returns as fungus.  In the interim I recommend he treat daily with a pumice stone and daily urea cream use as well as a silicone toe cap which I dispensed to him today.  Return in about 2 months (around 01/28/2021).

## 2020-11-30 NOTE — Patient Instructions (Signed)
Look for urea 40% cream or ointment and apply to the thickened dry skin / calluses. This can be bought over the counter, at a pharmacy or online such as [employer].  

## 2020-12-01 ENCOUNTER — Ambulatory Visit: Payer: No Typology Code available for payment source | Admitting: Physician Assistant

## 2020-12-01 ENCOUNTER — Encounter: Payer: Self-pay | Admitting: Physician Assistant

## 2020-12-01 DIAGNOSIS — L918 Other hypertrophic disorders of the skin: Secondary | ICD-10-CM

## 2020-12-01 DIAGNOSIS — L0292 Furuncle, unspecified: Secondary | ICD-10-CM

## 2020-12-01 DIAGNOSIS — Z1283 Encounter for screening for malignant neoplasm of skin: Secondary | ICD-10-CM

## 2020-12-01 DIAGNOSIS — L02423 Furuncle of right upper limb: Secondary | ICD-10-CM

## 2020-12-02 ENCOUNTER — Other Ambulatory Visit: Payer: Self-pay | Admitting: Nurse Practitioner

## 2020-12-02 DIAGNOSIS — L918 Other hypertrophic disorders of the skin: Secondary | ICD-10-CM

## 2020-12-02 NOTE — Telephone Encounter (Signed)
Xolair Order: # vials: 2 Ordered date: 12/02/20 Expected date of arrival: 12/07/20 Ordered by: [place]: [facility]

## 2020-12-06 ENCOUNTER — Encounter: Payer: Self-pay | Admitting: Physician Assistant

## 2020-12-06 NOTE — Progress Notes (Signed)
   New Patient   Subjective  Jose Liu is a 65 y.o. male who presents for the following: Annual Exam ([facility] [place]).   The following portions of the chart were reviewed this encounter and updated as appropriate:  Tobacco  Allergies  Meds  Problems  Med Hx  Surg Hx  Fam Hx      Objective  Well appearing patient in no apparent distress; mood and affect are within normal limits.  All skin waist up examined.  Objective  waist up: No atypical nevi No signs of non-mole skin cancer. Full body skin examination  Objective  Right Upper Arm - Anterior: Red nodule  Objective  Left Medial Thigh: Fleshy, skin-colored sessile and pedunculated papules.    Images     Assessment & Plan  Encounter for screening for malignant neoplasm of skin waist up  Yearly skin exams  Boil Right Upper Arm - Anterior  Anaerobic and Aerobic Culture - Right Upper Arm - Anterior  Skin tag Left Medial Thigh  Destruction of lesion - Left Medial Thigh Complexity: simple   Destruction method comment:  Scissors were used to snip tag at the base Informed consent: discussed and consent obtained   Timeout:  patient name, date of birth, surgical site, and procedure verified Anesthesia: the lesion was anesthetized in a standard fashion   Hemostasis achieved with:  pressure Outcome: patient tolerated procedure well with no complications   Post-procedure details: wound care instructions given       I, Kale Rondeau, PA-C, have reviewed all documentation for this visit. The documentation on 12/06/20 for the exam, diagnosis, procedures, and orders are all accurate and complete.

## 2020-12-07 LAB — ANAEROBIC AND AEROBIC CULTURE
MICRO NUMBER:: 11491772
MICRO NUMBER:: 11491773
SPECIMEN QUALITY:: ADEQUATE
SPECIMEN QUALITY:: ADEQUATE

## 2020-12-07 NOTE — Telephone Encounter (Signed)
Xolair Received: # Vials: 2 Medication arrival date: 12/07/20 Lot #: [id number] Exp date: 06/28/2024 Received by: Elliot Dally

## 2020-12-08 ENCOUNTER — Other Ambulatory Visit: Payer: Self-pay

## 2020-12-08 ENCOUNTER — Ambulatory Visit (INDEPENDENT_AMBULATORY_CARE_PROVIDER_SITE_OTHER): Payer: No Typology Code available for payment source

## 2020-12-08 DIAGNOSIS — J4520 Mild intermittent asthma, uncomplicated: Secondary | ICD-10-CM | POA: Diagnosis not present

## 2020-12-08 MED ORDER — OMALIZUMAB 150 MG ~~LOC~~ SOLR
300.0000 mg | SUBCUTANEOUS | Status: DC
Start: 2020-12-08 — End: 2021-03-09
  Administered 2020-12-08: 300 mg via SUBCUTANEOUS

## 2020-12-08 NOTE — Progress Notes (Signed)
Have you been hospitalized within the last 10 days?  No Do you have a fever?  No Do you have a cough?  No Do you have a headache or sore throat? No Do you have your Epi Pen visible and is it within date?  Yes 

## 2020-12-19 ENCOUNTER — Encounter: Payer: Self-pay | Admitting: *Deleted

## 2020-12-26 ENCOUNTER — Telehealth: Payer: Self-pay | Admitting: Emergency Medicine

## 2020-12-26 NOTE — Telephone Encounter (Signed)
Xolair Order: # vials: 2 Ordered date: 12/26/20 Expected date of arrival: 12/29/20 Ordered by: [place]

## 2020-12-27 ENCOUNTER — Other Ambulatory Visit: Payer: Self-pay | Admitting: Nurse Practitioner

## 2020-12-29 NOTE — Telephone Encounter (Signed)
Xolair Received: # Vials: 2 Medication arrival date: 12/29/20 Lot #: [id number] Exp date: 06/28/2024 Received by: Elliot Dally

## 2021-01-05 ENCOUNTER — Other Ambulatory Visit: Payer: Self-pay

## 2021-01-05 ENCOUNTER — Telehealth: Payer: Self-pay | Admitting: Pharmacy Technician

## 2021-01-05 ENCOUNTER — Ambulatory Visit (INDEPENDENT_AMBULATORY_CARE_PROVIDER_SITE_OTHER): Payer: No Typology Code available for payment source

## 2021-01-05 DIAGNOSIS — J4520 Mild intermittent asthma, uncomplicated: Secondary | ICD-10-CM

## 2021-01-05 MED ORDER — OMALIZUMAB 150 MG ~~LOC~~ SOLR
300.0000 mg | SUBCUTANEOUS | Status: DC
  Administered 2021-01-05: 300 mg via SUBCUTANEOUS

## 2021-01-05 NOTE — Telephone Encounter (Signed)
Patient is interested in self-injections. Please submit PA for Xolair syringes.  Dose is 300mg  every 28 days

## 2021-01-05 NOTE — Progress Notes (Signed)
Have you been hospitalized within the last 10 days?  No Do you have a fever?  No Do you have a cough?  No Do you have a headache or sore throat? No  

## 2021-01-05 NOTE — Telephone Encounter (Signed)
Submitted a Prior Authorization request to [facility] for [place] via Cover My Meds. Will update once we receive a response.   KEY# [id number] PA# [id number]

## 2021-01-09 ENCOUNTER — Encounter: Payer: Self-pay | Admitting: Podiatry

## 2021-01-09 ENCOUNTER — Other Ambulatory Visit: Payer: Self-pay | Admitting: Nurse Practitioner

## 2021-01-09 ENCOUNTER — Telehealth: Payer: Self-pay | Admitting: Emergency Medicine

## 2021-01-09 DIAGNOSIS — G5603 Carpal tunnel syndrome, bilateral upper limbs: Secondary | ICD-10-CM

## 2021-01-09 MED ORDER — BUDESONIDE 0.25 MG/2ML IN SUSP: 0.2500 mg | Freq: Two times a day (BID) | RESPIRATORY_TRACT | 6 refills | Status: DC

## 2021-01-09 NOTE — Telephone Encounter (Signed)
I think they will likely reject it if he is not on maintenance ICS. Give him the option of either nebulized budesonide bid versus QVAR 2 puffs bid. If he doesn't want to start an ICS, then we can send in an appeal, but I suspect it will be rejected.

## 2021-01-09 NOTE — Telephone Encounter (Signed)
I called and spoke with the pt  He is requesting refills for albuterol neb sol and budesonide neb sol  He states that he has been taking these both prn (rarely) for many years  Both meds have now expired  I do not see that Dr Lamonte Sakai ever prescribed  Please advise if you are okay with sending these in and also see msg from [facility] below regarding Xolair, thanks!

## 2021-01-09 NOTE — Telephone Encounter (Signed)
Do the nebulized budesonide. For purposes of his appeal, need to indicate that he is taking it daily (he probably would benefit from this anyway)

## 2021-01-09 NOTE — Telephone Encounter (Signed)
Received a fax regarding Prior Authorization from [place] for [place].  Authorization has been DENIED because patient is not using Xolair as ADD-ON therapy to maintenance inhalers.  Patient does not want to take any maintenance inhalers and advised that I submit "as we did it before."  He does not like taking medications. Discussed that Xolair is FDA-approved as add-on therapy only and not monotherapy.  Appeal faxed. Documentation shows patient's interruption in Xolair therapy led to recurrence of symptoms.  Fax# [phone] Phone# [phone]  Knox Saliva, PharmD, MPH Clinical Pharmacist (Rheumatology and Pulmonology)

## 2021-01-09 NOTE — Telephone Encounter (Signed)
Returned patient's call. He would like a refill of his budesonide and albuterol nebs sent to pharmacy. However don't see budesonide on file. Will route to Dr. Lamonte Sakai.  Appeal for [place] faxed to insurance - insurance denied b/c patient is not taking any maintenance inhalers. Discussed in detail with patient who reports he does not want side effects from steroids and does not like taking medications.  Advised that ICS medications do not have systemic side effects like oral steroids.  Patient requested that we submit "like you've been doing" to get Xolair approved. Discussed that insurance requirements may have changed.

## 2021-01-10 NOTE — Telephone Encounter (Signed)
Received notification from [facility] regarding Appeal for [place] syringes. Authorization has been APPROVED from 12/12/20 to 01/09/22.   Authorization # [id number] Phone # [phone]   [place] Card:  BIN: [id number] PCN: [id number] Group: [id number]

## 2021-01-10 NOTE — Telephone Encounter (Signed)
Received notification from [facility] regarding Appeal for [place] syringes. Authorization has been APPROVED from 12/12/20 to 01/09/22.   Authorization # [id number] A Phone # [phone]

## 2021-01-11 ENCOUNTER — Other Ambulatory Visit: Payer: Self-pay | Admitting: Nurse Practitioner

## 2021-01-11 DIAGNOSIS — M542 Cervicalgia: Secondary | ICD-10-CM

## 2021-01-11 NOTE — Telephone Encounter (Signed)
Had advised Patient would not be willing to administer his own Xolair injection. Patient came to last injection 01/05/21. Discussed infusion starting injections and self injections.  Patient refused to try self injections at this time.

## 2021-01-16 NOTE — Telephone Encounter (Addendum)
No prior auth required for [facility] (medical).  Pt have active insurance coverage. Faxed predetermination request which is not mandatory but recommended (optional)

## 2021-01-16 NOTE — Telephone Encounter (Signed)
Received notification from [facility] regarding a prior authorization for XOLAIR. Authorization has been APPROVED from 12/12/20 to 01/09/21.   Authorization #  Phone #

## 2021-01-17 MED ORDER — ALBUTEROL SULFATE (2.5 MG/3ML) 0.083% IN NEBU: 2.5000 mg | INHALATION_SOLUTION | RESPIRATORY_TRACT | 6 refills | Status: DC | PRN

## 2021-01-17 NOTE — Telephone Encounter (Signed)
Dr. Lamonte Sakai, Please see patient comment regarding request for Albuterol for nebulizer.  He received notification from his pharmacy regarding the budesonide and I explained the purpose.  Please advise.  Thank you.

## 2021-01-17 NOTE — Telephone Encounter (Signed)
Yes Ok to call in albuterol nebs q4h p4n for SOB

## 2021-01-18 ENCOUNTER — Other Ambulatory Visit: Payer: Self-pay | Admitting: Nurse Practitioner

## 2021-01-18 NOTE — Telephone Encounter (Signed)
Requested Prescriptions  Pending Prescriptions Disp Refills  . famotidine (PEPCID) 40 MG tablet [Pharmacy Med Name: FAMOTIDINE 40 MG TABLET] 90 tablet 2    Sig: TAKE 1 TABLET BY MOUTH EVERY DAY     Gastroenterology:  H2 Antagonists Passed - 01/18/2021  1:26 AM      Passed - Valid encounter within last 12 months    Recent Outpatient Visits          1 month ago Morbid obesity ([place])   [place], [place] T, NP   8 months ago Skin tag   [place], [place] T, NP   11 months ago Chronic midline low back pain with left-sided sciatica   [place], Barbaraann Faster, NP   1 year ago Annual physical exam   [facility], Barbaraann Faster, NP   1 year ago Morbid obesity ([place])   [place], Barbaraann Faster, NP      Future Appointments            In 1 month [place], Barbaraann Faster, NP [employer], [place]   In 10 months Ralene Bathe, MD [place]

## 2021-01-20 ENCOUNTER — Encounter: Payer: Self-pay | Admitting: Nurse Practitioner

## 2021-01-20 ENCOUNTER — Other Ambulatory Visit: Payer: Self-pay

## 2021-01-20 ENCOUNTER — Ambulatory Visit: Payer: No Typology Code available for payment source | Admitting: Nurse Practitioner

## 2021-01-20 VITALS — BP 129/72 | HR 80 | Temp 98.4°F | Wt 333.0 lb

## 2021-01-20 DIAGNOSIS — M542 Cervicalgia: Secondary | ICD-10-CM | POA: Diagnosis not present

## 2021-01-20 DIAGNOSIS — M47812 Spondylosis without myelopathy or radiculopathy, cervical region: Secondary | ICD-10-CM | POA: Insufficient documentation

## 2021-01-20 DIAGNOSIS — M1990 Unspecified osteoarthritis, unspecified site: Secondary | ICD-10-CM

## 2021-01-20 NOTE — Patient Instructions (Signed)

## 2021-01-20 NOTE — Progress Notes (Signed)
BP 129/72   Pulse 80   Temp 98.4 F (36.9 C) (Oral)   Wt (!) 333 lb (151 kg)   SpO2 96%   BMI 47.67 kg/m    Subjective:    Patient ID: Jose Liu, male    DOB: 1956-09-14, 65 y.o.   MRN: [id number]  HPI: Jose Liu is a 65 y.o. male  Chief Complaint  Patient presents with  . Numbness    Patient complains of numbness in L thigh due to injury or trauma to a L4 and L5. Patient states numbness in both of his arms. He can't drive for long periods of time due to the tingling and discomfort. Patient states this has been an ongoing issue that has worsen over time. Patient denies having any chest pains. Patient states he uses Biofreeze.   Marland Kitchen Shoulder Pain   SHOULDER/NECK & LOWER BACK PAIN States he was supposed to have back surgery, his "L4-L5 are crushed". Has radiculopathy, left leg.  Has arthritis and bursitis in hips.  Has been told he has shoulder issues too, with impingement due to arthritis.  Is fearful of surgery for back, which has been recommended, due to past experiences with surgery and not receiving enough anesthesia.  Reports he often needs more anesthesia then normal and has had bad experiences with rapidly being woken up after surgery.  Does endorse having worsening numbness in arms bilaterally (keeps him up at night sometimes) and left thigh -- taking Gabapentin 300 MG BID.  Having issues opening jars up.  Had recent referral to neurosurgery and they reported to him they need MRI neck and shoulders prior to him being seen.  He wishes to focus more on upper body at this time and not lower back due to discomfort in arms.   Duration: chronic Involved shoulder: bilateral Mechanism of injury: unknown Location: diffuse Onset:gradual Severity: 6/10  Quality:  numbness, dull and aching Frequency: intermittent Radiation: no Aggravating factors: long driving and movement  Alleviating factors: Gabapentin, moving arms and rest  Status: fluctuating Treatments attempted:  Gabapentin, movement of arms and rest  Relief with NSAIDs?:  No NSAIDs Taken Weakness: no Numbness: yes Decreased grip strength: yes Redness: no Swelling: no Bruising: no Fevers: no   Relevant past medical, surgical, family and social history reviewed and updated as indicated. Interim medical history since our last visit reviewed. Allergies and medications reviewed and updated.  Review of Systems  Constitutional: Negative for activity change, diaphoresis, fatigue and fever.  Respiratory: Negative for cough, chest tightness, shortness of breath and wheezing.   Cardiovascular: Negative for chest pain, palpitations and leg swelling.  Gastrointestinal: Negative.   Musculoskeletal: Positive for arthralgias.  Neurological: Positive for numbness. Negative for dizziness, syncope, weakness and headaches.  Psychiatric/Behavioral: Negative.     Per HPI unless specifically indicated above     Objective:    BP 129/72   Pulse 80   Temp 98.4 F (36.9 C) (Oral)   Wt (!) 333 lb (151 kg)   SpO2 96%   BMI 47.67 kg/m   Wt Readings from Last 3 Encounters:  01/20/21 (!) 333 lb (151 kg)  11/24/20 (!) 332 lb 3.2 oz (150.7 kg)  11/04/20 (!) 325 lb (147.4 kg)    Physical Exam Vitals and nursing note reviewed.  Constitutional:      General: He is awake. He is not in acute distress.    Appearance: He is well-developed and well-groomed. He is morbidly obese. He is not ill-appearing.  HENT:  Head: Normocephalic and atraumatic.     Right Ear: Hearing normal. No drainage.     Left Ear: Hearing normal. No drainage.  Eyes:     General: Lids are normal.        Right eye: No discharge.        Left eye: No discharge.     Conjunctiva/sclera: Conjunctivae normal.     Pupils: Pupils are equal, round, and reactive to light.  Neck:     Thyroid: No thyromegaly.     Vascular: No carotid bruit.     Trachea: Trachea normal.  Cardiovascular:     Rate and Rhythm: Normal rate and regular rhythm.      Heart sounds: Normal heart sounds, S1 normal and S2 normal. No murmur heard. No gallop.   Pulmonary:     Effort: Pulmonary effort is normal. No accessory muscle usage or respiratory distress.     Breath sounds: Normal breath sounds.  Abdominal:     General: Bowel sounds are normal.     Palpations: Abdomen is soft.  Musculoskeletal:        General: Normal range of motion.     Right upper arm: Normal.     Left upper arm: Normal.     Right hand: No swelling or tenderness. Normal range of motion. Normal strength. Normal sensation. Normal pulse.     Left hand: No swelling or tenderness. Normal range of motion. Normal strength. Normal sensation. Normal pulse.     Cervical back: Normal range of motion and neck supple.     Right lower leg: No edema.     Left lower leg: No edema.     Comments: Positive Phalen R>L.  Negative Tinel bilaterally.  No rashes noted.    Skin:    General: Skin is warm and dry.     Capillary Refill: Capillary refill takes less than 2 seconds.     Findings: No rash.  Neurological:     Mental Status: He is alert and oriented to person, place, and time.     Deep Tendon Reflexes: Reflexes are normal and symmetric.     Reflex Scores:      Brachioradialis reflexes are 2+ on the right side and 2+ on the left side.      Patellar reflexes are 2+ on the right side and 2+ on the left side. Psychiatric:        Attention and Perception: Attention normal.        Mood and Affect: Mood normal.        Speech: Speech normal.        Behavior: Behavior normal. Behavior is cooperative.        Thought Content: Thought content normal.    Results for orders placed or performed in visit on 12/01/20  Anaerobic and Aerobic Culture   Specimen: Skin  Result Value Ref Range   MICRO NUMBER: [id number]    SPECIMEN QUALITY: Adequate    Source: NOT GIVEN    STATUS: FINAL    GRAM STAIN:      Few White blood cells seen No epithelial cells seen No organisms seen   ANA RESULT: No anaerobes  isolated.    MICRO NUMBER: [id number]    SPECIMEN QUALITY: Adequate    SOURCE: NOT GIVEN    STATUS: FINAL    AER RESULT:      Growth of skin flora (note: Growth does not include S. aureus, beta-hemolytic Streptococci or P. aeruginosa).   COMMENT:  No source was provided. The specimen was tested and reported based upon the test code ordered. If this is incorrect, please contact client services.      Assessment & Plan:   Problem List Items Addressed This Visit      Musculoskeletal and Integument   Arthritis    Ongoing to back, shoulders, and hips.  Addition of Gabapentin has significantly helped quality of life at this time, continue this regimen.  Continue Tylenol & Biofreeze as needed, uses minimally and prefers not to take many pain medications.  Recommend focus on weight loss, which would benefit joint pain.  Will obtain x-ray imaging of bilateral shoulders, although suspect ongoing arm numbness related more to neck or carpal tunnel issues.  Refer to neck pain plan for further.  Return as scheduled in upcoming month.      Relevant Orders   DG Shoulder Left   DG Shoulder Right     Other   Neck pain - Primary    Ongoing issue with current ongoing arm numbness bilaterally, worse with driving long hours or using arms for tasks.  Suspect some cervical radiculopathy.  Will obtain MRI imaging cervical spine due to patient past history.  Neurosurgery referral in place and will obtain appointment after imaging has returned.  Continue current medication regimen at home.  Return as scheduled in upcoming weeks.  No red flag signs on exam today.      Relevant Orders   MR Cervical Spine Wo Contrast       Follow up plan: Return for as scheduled.

## 2021-01-20 NOTE — Assessment & Plan Note (Signed)
Ongoing to back, shoulders, and hips.  Addition of Gabapentin has significantly helped quality of life at this time, continue this regimen.  Continue Tylenol & Biofreeze as needed, uses minimally and prefers not to take many pain medications.  Recommend focus on weight loss, which would benefit joint pain.  Will obtain x-ray imaging of bilateral shoulders, although suspect ongoing arm numbness related more to neck or carpal tunnel issues.  Refer to neck pain plan for further.  Return as scheduled in upcoming month.

## 2021-01-20 NOTE — Assessment & Plan Note (Signed)
Ongoing issue with current ongoing arm numbness bilaterally, worse with driving long hours or using arms for tasks.  Suspect some cervical radiculopathy.  Will obtain MRI imaging cervical spine due to patient past history.  Neurosurgery referral in place and will obtain appointment after imaging has returned.  Continue current medication regimen at home.  Return as scheduled in upcoming weeks.  No red flag signs on exam today.

## 2021-01-26 NOTE — Addendum Note (Signed)
Addended by: Marnee Guarneri T on: 01/26/2021 10:15 AM   Modules accepted: Orders

## 2021-01-30 ENCOUNTER — Ambulatory Visit: Payer: BLUE CROSS/BLUE SHIELD | Admitting: Podiatry

## 2021-02-01 ENCOUNTER — Other Ambulatory Visit: Payer: Self-pay

## 2021-02-01 ENCOUNTER — Encounter: Payer: Self-pay | Admitting: Podiatry

## 2021-02-01 ENCOUNTER — Other Ambulatory Visit: Payer: Self-pay | Admitting: Pharmacy Technician

## 2021-02-01 ENCOUNTER — Telehealth: Payer: Self-pay | Admitting: Pharmacy Technician

## 2021-02-01 ENCOUNTER — Ambulatory Visit (INDEPENDENT_AMBULATORY_CARE_PROVIDER_SITE_OTHER): Payer: No Typology Code available for payment source | Admitting: Podiatry

## 2021-02-01 DIAGNOSIS — L84 Corns and callosities: Secondary | ICD-10-CM

## 2021-02-01 DIAGNOSIS — M24472 Recurrent dislocation, left ankle: Secondary | ICD-10-CM | POA: Diagnosis not present

## 2021-02-01 DIAGNOSIS — J4520 Mild intermittent asthma, uncomplicated: Secondary | ICD-10-CM | POA: Insufficient documentation

## 2021-02-01 DIAGNOSIS — L603 Nail dystrophy: Secondary | ICD-10-CM

## 2021-02-01 NOTE — Telephone Encounter (Signed)
Xolair have been ordered from [facility] for delivery tomorrow. Ref # [id number].  I will reach out to scheduling for an 02/02/21 appt (asap)

## 2021-02-01 NOTE — Progress Notes (Signed)
  Subjective:  Patient ID: Jose Liu, male    DOB: [birth year],  MRN: [id number]  Chief Complaint  Patient presents with  . Nail Problem    "I haven't noticed much diffrence"    65 y.o. male returns for follow-up with the above complaint. History confirmed with patient.  He is figured out that his shoes have been tight fitting and the foot has become longer.  Having to wear boots now which has been better for the toe.  Has some new shoes on the way that should fit better.  Also has had some clicking on the outside of his ankle  Objective:  Physical Exam: warm, good capillary refill, no trophic changes or ulcerative lesions, normal DP and PT pulses and normal sensory exam. Left Foot: Second toe dystrophic nail with callus at tip of toe, he has peroneal subluxation with circumduction of the foot on the ankle, no pain or edema in the area  [facility] pathology results from last visit no evidence of onychomycosis, consistent with nail dystrophy Assessment:   1. Nail dystrophy   2. Callus of foot   3. 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  Reviewed his pathology results with him and discussed that in the event the nail is painful or persistently an issue we could remove it permanently, he would prefer to avoid this.  I think he is spot on with his issue with his shoes and his new shoes should fit better and help to alleviate the issue.  Continue with the urea cream as needed.  He has as an issue of chronic peroneal subluxation from a previous fall, is not painful and is not causing him any issues, discussed etiology and treatment options for this and he will continue to monitor  Return if symptoms worsen or fail to improve.

## 2021-02-01 NOTE — Telephone Encounter (Signed)
Please see pt message. He's checking to see if medication is at office for his visit tomorrow. However, I do not see where he has a scheduled appt for the infusion center.  Please Advise.

## 2021-02-02 ENCOUNTER — Telehealth: Payer: Self-pay | Admitting: Pharmacy Technician

## 2021-02-02 ENCOUNTER — Ambulatory Visit (INDEPENDENT_AMBULATORY_CARE_PROVIDER_SITE_OTHER): Payer: No Typology Code available for payment source

## 2021-02-02 VITALS — BP 171/76 | HR 102 | Temp 98.8°F | Resp 18

## 2021-02-02 DIAGNOSIS — J4520 Mild intermittent asthma, uncomplicated: Secondary | ICD-10-CM

## 2021-02-02 MED ORDER — OMALIZUMAB 150 MG/ML ~~LOC~~ SOSY
300.0000 mg | PREFILLED_SYRINGE | Freq: Once | SUBCUTANEOUS | Status: AC
  Administered 2021-02-02: 300 mg via SUBCUTANEOUS
  Filled 2021-02-02: qty 2

## 2021-02-02 NOTE — Telephone Encounter (Signed)
Will need new script sent to [facility] [phone]) for syg's. Patient mentioned he would like to self inject if possible.

## 2021-02-02 NOTE — Progress Notes (Signed)
Diagnosis: Asthma  Provider:  Marshell Garfinkel, MD  Procedure: Injection  Xolair (Omalizumab), Dose: 300 mg, Site: subcutaneous left arm  Discharge: Condition: Good, Destination: Home . AVS provided to patient.   Performed by:  Koren Shiver, RN

## 2021-02-06 NOTE — Telephone Encounter (Signed)
Patient reached out via MyChart requesting to speak with Salome Arnt LPN directly regarding Xolair injections. Will f/u with patient once Lattie Haw has reached out. He was previously approved for self-administration of Xolair and is eligible for copay card as well --- he expressed interest in self-admin at last Xolair dose

## 2021-02-07 MED ORDER — OMALIZUMAB 150 MG/ML ~~LOC~~ SOSY: 300.0000 mg | PREFILLED_SYRINGE | SUBCUTANEOUS | 0 refills | Status: DC

## 2021-02-07 NOTE — Telephone Encounter (Signed)
Routing to infusion team as an Pharmacist, hospital

## 2021-02-07 NOTE — Telephone Encounter (Signed)
Called and spoke with Patient. Patient had some concerns with his last Xolair injection. Patient stated he received 300mg  in one arm, one site. Patient stated his Xolair vials were mixed and drawn up in one syringe, 300mg ,  instead of being drawn up in two 150mg  syringes, and given 150mg  in each arm. Patient stated later that day he felt flush, stomach felt weird, and he had a headache,that he believes was related to receiving 300mg  in one syringe, one injection site. Patient denied having any sob, throat closing, or difficulty breathing. Patient stated his BP was elevated at injection appointment. Patient stated the person who gave his last Xolair injection showed him a demo self injection pen, instead of the Xolair demo prefilled syringe. Patient stated he was instructed to place injection pen in garbage after use. 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 I discussed Xolair injection sites for clinic and self administration sites. Injection sites discussed were stomach, thigh/upper leg area, and back of arm, if someone is able to administer injection for Patient.  I discussed ordering Xolair and red needle boxes/sharps container  for used needles supplied by most specialty pharmacies. We discussed disposal for used needle boxes/ sharps container once full. I discussed Xolair self administration with [place]ki, Pharmacist. Patient is interested in going forward with self administration training for Xolair injections. Patient is scheduled 03/09/21 for next Xolair injection.     Per Xolair manufacturer website - What if I need more than 1 injection? If your prescribed dose requires more than 1 injection, choose a different injection site for each new injection, at least 1 inch from other injection sites.  Complete all the required injections for your prescribed dose, immediately one after another. Contact your healthcare provider if you have any  questions.  Doses of more than 150 mg are divided among more than 1 injection site to limit injections to not more than 150 mg per site.1

## 2021-02-07 NOTE — Telephone Encounter (Addendum)
Patient scheduled with pharmacy team on 03/09/21 @ 1:20pm. Patient may or may not transition to self-admin. Was trained on auto-injector at last dose with infusion center, but Xolair is only available as pre-filled syringe for self-administration.  [facility] to be delivered to the clinic  Knox Saliva, PharmD, MPH Clinical Pharmacist (Rheumatology and Pulmonology)

## 2021-02-12 ENCOUNTER — Ambulatory Visit: Payer: BLUE CROSS/BLUE SHIELD

## 2021-02-13 ENCOUNTER — Institutional Professional Consult (permissible substitution): Payer: BLUE CROSS/BLUE SHIELD | Admitting: Diagnostic Neuroimaging

## 2021-02-14 NOTE — Telephone Encounter (Signed)
[facility], they needed clarification for rx change to [facility].  [facility] had system outage during order setup. Will call back later today or tomorrow to schedule shipment.  Phone# [phone]

## 2021-02-15 NOTE — Telephone Encounter (Signed)
[facility]ed [facility], set up shipment to clinic on 02/28/2021.

## 2021-02-20 ENCOUNTER — Other Ambulatory Visit: Payer: Self-pay | Admitting: Nurse Practitioner

## 2021-02-20 ENCOUNTER — Ambulatory Visit: Payer: No Typology Code available for payment source | Admitting: Nurse Practitioner

## 2021-02-22 ENCOUNTER — Ambulatory Visit
Admission: RE | Admit: 2021-02-22 | Discharge: 2021-02-22 | Disposition: A | Discharge status: Home / Self Care | Payer: BLUE CROSS/BLUE SHIELD | Source: Ambulatory Visit | Attending: Nurse Practitioner | Admitting: Nurse Practitioner

## 2021-02-22 ENCOUNTER — Ambulatory Visit
Admission: RE | Admit: 2021-02-22 | Discharge: 2021-02-22 | Disposition: A | Discharge status: Home / Self Care | Payer: BLUE CROSS/BLUE SHIELD | Source: Home / Self Care | Attending: Nurse Practitioner | Admitting: Nurse Practitioner

## 2021-02-22 ENCOUNTER — Other Ambulatory Visit: Payer: Self-pay

## 2021-02-22 DIAGNOSIS — M1990 Unspecified osteoarthritis, unspecified site: Secondary | ICD-10-CM | POA: Insufficient documentation

## 2021-02-22 DIAGNOSIS — M542 Cervicalgia: Secondary | ICD-10-CM | POA: Insufficient documentation

## 2021-02-22 NOTE — Progress Notes (Signed)
Contacted via [place] afternoon Guilherme, are you scheduled to see neurosurgery?  I have your MRI results and this would be beneficial, I know they had wanted imaging in hand first.  Definitely the cervical spine is showing moderate degenerative changes and a disc bulge C5-C6.  Based on these findings this could be causing issues with upper extremities and neurosurgery would be valuable.  If we need to place a new referral now that this is returned please let me know.  That way we can determine what next steps may need to be taken.:) Keep being awesome!!  Thank you for allowing me to participate in your care. Kindest regards, Margerite Impastato

## 2021-02-23 NOTE — Progress Notes (Signed)
Contacted via MyChart   Good evening Jose Liu, I have your shoulder imaging back.  Right shoulder shows no degenerative changes.  Left shoulder is showing some mild degeneration to the [facility] joint area, which is present anteriorly on the upper aspect.  So a little arthritis in this area.  That can cause some shoulder pain, but I feel the majority of the discomfort with arms may be coming from the neck.  We see this often, one of the things my own husband struggles with too from jumping out of planes for years in [place] -- arm issues due to his neck arthritic changes.  We can definitely take either route if you are interested in pursuing further assessment --- ortho vs neurosurgery.  Ortho would focus on both neck and shoulders, where as neurosurgery will focus more on neck most likely.  I provider you Dr. Cari Caraway, with [facility] neurosurgery, information today.  Let me know any thoughts or concerns?  Thank you:) Keep being incredible!!  Thank you for allowing me to participate in your care. Kindest regards, Mehak Roskelley

## 2021-02-28 ENCOUNTER — Encounter: Payer: No Typology Code available for payment source | Admitting: Nurse Practitioner

## 2021-03-01 ENCOUNTER — Ambulatory Visit (INDEPENDENT_AMBULATORY_CARE_PROVIDER_SITE_OTHER): Payer: No Typology Code available for payment source | Admitting: Nurse Practitioner

## 2021-03-01 ENCOUNTER — Encounter: Payer: Self-pay | Admitting: Nurse Practitioner

## 2021-03-01 ENCOUNTER — Other Ambulatory Visit: Payer: Self-pay

## 2021-03-01 DIAGNOSIS — J4520 Mild intermittent asthma, uncomplicated: Secondary | ICD-10-CM

## 2021-03-01 DIAGNOSIS — M47812 Spondylosis without myelopathy or radiculopathy, cervical region: Secondary | ICD-10-CM

## 2021-03-01 DIAGNOSIS — M1990 Unspecified osteoarthritis, unspecified site: Secondary | ICD-10-CM

## 2021-03-01 DIAGNOSIS — J3081 Allergic rhinitis due to animal (cat) (dog) hair and dander: Secondary | ICD-10-CM

## 2021-03-01 DIAGNOSIS — Z1159 Encounter for screening for other viral diseases: Secondary | ICD-10-CM

## 2021-03-01 DIAGNOSIS — Z114 Encounter for screening for human immunodeficiency virus [HIV]: Secondary | ICD-10-CM

## 2021-03-01 DIAGNOSIS — I10 Essential (primary) hypertension: Secondary | ICD-10-CM

## 2021-03-01 DIAGNOSIS — G4733 Obstructive sleep apnea (adult) (pediatric): Secondary | ICD-10-CM

## 2021-03-01 DIAGNOSIS — K219 Gastro-esophageal reflux disease without esophagitis: Secondary | ICD-10-CM

## 2021-03-01 DIAGNOSIS — Z125 Encounter for screening for malignant neoplasm of prostate: Secondary | ICD-10-CM

## 2021-03-01 DIAGNOSIS — Z0000 Encounter for general adult medical examination without abnormal findings: Secondary | ICD-10-CM | POA: Diagnosis not present

## 2021-03-01 DIAGNOSIS — Q893 Situs inversus: Secondary | ICD-10-CM

## 2021-03-01 MED ORDER — PANTOPRAZOLE SODIUM 40 MG PO TBEC: 1.0000 | DELAYED_RELEASE_TABLET | Freq: Every day | ORAL | 4 refills | Status: DC

## 2021-03-01 MED ORDER — MONTELUKAST SODIUM 10 MG PO TABS: ORAL_TABLET | ORAL | 4 refills | Status: DC

## 2021-03-01 MED ORDER — LISINOPRIL-HYDROCHLOROTHIAZIDE 10-12.5 MG PO TABS: 2.0000 | ORAL_TABLET | Freq: Every day | ORAL | 4 refills | Status: DC

## 2021-03-01 NOTE — Patient Instructions (Signed)

## 2021-03-01 NOTE — Assessment & Plan Note (Signed)
Ongoing issue with current ongoing arm numbness bilaterally, worse with driving long hours or using arms for tasks.  Recent MRI did note cervical spondylosis and degenerative disease.  Continue current medication regimen at home, Gabapentin, and adjust as needed.  No red flag signs on exam today.  Has neurosurgery referral in place, he does endorse he may not attend as has fear of surgery due to past experiences.  Return in 6 months, sooner if worsening.  Will attempt to scan old imaging into chart, he brought with him today.

## 2021-03-01 NOTE — Assessment & Plan Note (Signed)
Diagnosed several years ago per patient.  Continue to monitor and consider referral to ENT if worsening symptoms. 

## 2021-03-01 NOTE — Assessment & Plan Note (Signed)
Chronic, stable with good compliance to CPAP.  Continue to utilize daily. 

## 2021-03-01 NOTE — Assessment & Plan Note (Signed)
Chronic, stable.  No recent exacerbations.  Followed by pulmonary and allergist. 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  Continue current medication regimen and adjust as needed, minimal use at this time of Albuterol.  Continue collaboration with pulmonary and review notes.  Is on ACE, if cough or refractory symptoms present then will determine alternate option for BP, such as Amlodipine or Losartan.  CBC today.  Return in 6 months.

## 2021-03-01 NOTE — Assessment & Plan Note (Signed)
BMI 47.67.  Recommended eating smaller high protein, low fat meals more frequently and exercising 30 mins a day 5 times a week with a goal of 10-15lb weight loss in the next 3 months. Patient voiced their understanding and motivation to adhere to these recommendations.

## 2021-03-01 NOTE — Assessment & Plan Note (Signed)
Chronic, ongoing.  Continue current medication regimen and adjust as needed.  Avoid elements that cause reactions, such as animals. Continue collaboration with pulmonary.  Return in 6 months.

## 2021-03-01 NOTE — Progress Notes (Signed)
BP 133/76   Pulse 87   Temp 99.3 F (37.4 C) (Oral)   Ht _0  (1.778 m)   Wt (!) 332 lb 3.2 oz (150.7 kg)   SpO2 96%   BMI 47.67 kg/m    Subjective:    Patient ID: Jose Liu, male    DOB: 06/10/1956, 65 y.o.   MRN: [id number]  HPI: Jose Liu is a 65 y.o. male presenting on 03/01/2021 for comprehensive medical examination. Current medical complaints include:none  He currently lives with: wife Interim Problems from his last visit: no   HYPERTENSION Currently taking Lisinopril-HCTZ 10-12.5 MG taking 2 tablet daily. 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  Has OSA and uses CPAP 100% of the time. Hypertension status: stable  Satisfied with current treatment? yes Duration of hypertension: chronic BP monitoring frequency:  daily BP range: 120/70's on average BP medication side effects:  no Medication compliance: good compliance Previous BP meds:Losartan-HCTZ Aspirin: no Recurrent headaches: no Visual changes: no Palpitations: no Dyspnea: no Chest pain: no Lower extremity edema: no Dizzy/lightheaded: no   GERD Continues on Protonix 40 MG daily and Famotidine 40 MG daily.  States his appendix and bowels are situated differently.  Had a colonoscopy last a couple years ago -- due next in 2029. GERD control status: stable  Satisfied with current treatment? yes Heartburn frequency: none Medication side effects: no  Medication compliance: stable Previous GERD medications: Antacid use frequency:  none Dysphagia: no Odynophagia:  no Hematemesis: no Blood in stool: no EGD: no   ASTHMA/ALLERGIC RHINITIS Takes Flonase nasal spray and Singulair 10 MG daily.  States he has narrow sinuses. Reports that Zpack does not work for him when he gets sinusitis, which he gets frequently.  Established with pulmonary on 11/12/2019, Dr. Lamonte Sakai, and last saw on 11/04/20 -- continues on Xolair with benefit.  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  Medications include Singulair, Claritin, and  Flonase. Asthma status: stable Satisfied with current treatment?: yes Albuterol/rescue inhaler frequency: once a month use, if around cats Dyspnea frequency: rarely Wheezing frequency: rarely Cough frequency: none Nocturnal symptom frequency: none Limitation of activity: no Current upper respiratory symptoms: no Triggers: allergies, cats or dogs Home peak flows: none Last Spirometry: unknown Failed/intolerant to following asthma meds: none Asthma meds in past: Albuterol Aerochamber/spacer use: no Visits to ER or Urgent Care in past year: no Pneumovax: refuses Influenza: refuses, make him sick   BACK PAIN & SHOULDER PAIN Was supposed to have back surgery years ago, his "L4-L5 are crushed". Has radiculopathy, left leg.  Has arthritis and bursitis in hips.  Is fearful of surgery for back, which has been recommended, due to past experiences with surgery and not receiving enough anesthesia.  Does not stay under well.  Also has impingement history of shoulders, which is currently causing numbness and pain in hands R>L.  History of working on railroad, multiple areas of arthritis.  Recent MRI cervical spine noted cervical spondylosis with moderate degeneration C5-C6 and moderate spinal canal stenosis.  Shoulder x-ray imaging noted normal right shoulder and left shoulder with subacromial spurring and degenerative AC.  Taking Gabapentin and has referral in place to neurosurgery.  He reports the Gabapentin does offer benefit Duration: chronic Mechanism of injury: unknown Location: bilateral and low back Onset: gradual Severity: mild Quality: dull and aching Frequency: intermittent Radiation: L leg below the knee Aggravating factors: lifting, movement, walking and bending Alleviating factors: APAP & Gabapentin Status: stable Treatments attempted: APAP & Gabapentin Relief with NSAIDs?: No NSAIDs Taken  Nighttime pain:  no Paresthesias / decreased sensation:  no Bowel / bladder incontinence:   no Fevers:  no Dysuria / urinary frequency:  no  Functional Status Survey: Is the patient deaf or have difficulty hearing?: No Does the patient have difficulty seeing, even when wearing glasses/contacts?: No Does the patient have difficulty concentrating, remembering, or making decisions?: No Does the patient have difficulty walking or climbing stairs?: No Does the patient have difficulty dressing or bathing?: No Does the patient have difficulty doing errands alone such as visiting a doctor's office or shopping?: No  FALL RISK: Fall Risk  03/01/2021 11/24/2020 11/24/2020 12/18/2019  Falls in the past year? 0 0 0 0  Number falls in past yr: 0 - - 0  Injury with Fall? 0 - - 0  Follow up Falls evaluation completed - - -    Depression Screen Depression screen [facility] 2/9 03/01/2021 03/01/2021 11/24/2020 12/18/2019 11/12/2019  Decreased Interest 0 0 0 0 0  Down, Depressed, Hopeless 0 0 0 0 0  PHQ - 2 Score 0 0 0 0 0  Altered sleeping 0 - 0 0 -  Tired, decreased energy 0 - 0 0 -  Change in appetite 0 - 0 0 -  Feeling bad or failure about yourself  0 - 0 0 -  Trouble concentrating 0 - 0 0 -  Moving slowly or fidgety/restless 0 - 0 0 -  Suicidal thoughts 0 - 0 0 -  PHQ-9 Score 0 - 0 0 -  Difficult doing work/chores Not difficult at all - - Not difficult at all -    Advanced Directives <no information>  Past Medical History:  Past Medical History:  Diagnosis Date  . Allergies   . Asthma   . GERD (gastroesophageal reflux disease)   . Hypertension   . Situs inversus     Surgical History:  Past Surgical History:  Procedure Laterality Date  . CARDIAC ELECTROPHYSIOLOGY STUDY AND ABLATION    . EYE SURGERY      Medications:  Current Outpatient Medications on File Prior to Visit  Medication Sig  . albuterol (PROVENTIL) (2.5 MG/3ML) 0.083% nebulizer solution Take 3 mLs (2.5 mg total) by nebulization every 4 (four) hours as needed for wheezing or shortness of breath.  Marland Kitchen albuterol (VENTOLIN  HFA) 108 (90 Base) MCG/ACT inhaler Inhale 1 puff into the lungs every 6 (six) hours as needed.  . budesonide (PULMICORT) 0.25 MG/2ML nebulizer solution Take 2 mLs (0.25 mg total) by nebulization in the morning and at bedtime.  Marland Kitchen EPINEPHrine 0.3 mg/0.3 mL IJ SOAJ injection Inject 0.3 mg into the muscle as needed for anaphylaxis.  . famotidine (PEPCID) 40 MG tablet TAKE 1 TABLET BY MOUTH EVERY DAY  . fluticasone (FLONASE) 50 MCG/ACT nasal spray USE 2 SPRAYS EACH NOSTRIL EVERY DAY  . gabapentin (NEURONTIN) 300 MG capsule Take 1 capsule (300 mg total) by mouth 2 (two) times daily.  Marland Kitchen omalizumab Arvid Right) 150 MG/ML prefilled syringe Inject 300 mg into the skin every 14 (fourteen) days. Deliver to clinic: [address] [place], [place], [place] [address]   Current Facility-Administered Medications on File Prior to Visit  Medication  . omalizumab Arvid Right) injection 300 mg  . omalizumab Arvid Right) injection 300 mg    Allergies:  Allergies  Allergen Reactions  . Honey Bee Treatment [Bee Venom]     Emesis PER PATIENT HONEY MAKES HIM SICK  . Soy Allergy     cramps    Social History:  Social History  Socioeconomic History  . Marital status: Married    Spouse name: Not on file  . Number of children: Not on file  . Years of education: Not on file  . Highest education level: Not on file  Occupational History  . Not on file  Tobacco Use  . Smoking status: Never Smoker  . Smokeless tobacco: Never Used  Vaping Use  . Vaping Use: Never used  Substance and Sexual Activity  . Alcohol use: Yes    Comment: on rare occasion  . Drug use: Never  . Sexual activity: Not on file  Other Topics Concern  . Not on file  Social History Narrative  . Not on file   Social Determinants of Health   Financial Resource Strain: Not on file  Food Insecurity: Not on file  Transportation Needs: Not on file  Physical Activity: Not on file  Stress: Not on file  Social Connections: Not on file  Intimate  Partner Violence: Not on file   Social History   Tobacco Use  Smoking Status Never Smoker  Smokeless Tobacco Never Used   Social History   Substance and Sexual Activity  Alcohol Use Yes   Comment: on rare occasion    Family History:  Family History  Problem Relation Age of Onset  . CAD Mother        quadruple CABG  . Stroke Mother   . Hypertension Mother   . Dementia Father   . Prostate cancer Father     Past medical history, surgical history, medications, allergies, family history and social history reviewed with patient today and changes made to appropriate areas of the chart.   Review of Systems - negative All other ROS negative except what is listed above and in the HPI.      Objective:    BP 133/76   Pulse 87   Temp 99.3 F (37.4 C) (Oral)   Ht _0  (1.778 m)   Wt (!) 332 lb 3.2 oz (150.7 kg)   SpO2 96%   BMI 47.67 kg/m   Wt Readings from Last 3 Encounters:  03/01/21 (!) 332 lb 3.2 oz (150.7 kg)  01/20/21 (!) 333 lb (151 kg)  11/24/20 (!) 332 lb 3.2 oz (150.7 kg)    Physical Exam Vitals and nursing note reviewed.  Constitutional:      General: He is awake. He is not in acute distress.    Appearance: He is well-developed. He is morbidly obese. He is not ill-appearing.  HENT:     Head: Normocephalic and atraumatic.     Right Ear: Hearing, tympanic membrane, ear canal and external ear normal. No drainage.     Left Ear: Hearing, tympanic membrane, ear canal and external ear normal. No drainage.     Nose: Nose normal.     Mouth/Throat:     Pharynx: Uvula midline.  Eyes:     General: Lids are normal.        Right eye: No discharge.        Left eye: No discharge.     Extraocular Movements: Extraocular movements intact.     Conjunctiva/sclera: Conjunctivae normal.     Pupils: Pupils are equal, round, and reactive to light.     Visual Fields: Right eye visual fields normal and left eye visual fields normal.  Neck:     Thyroid: No thyromegaly.      Vascular: No carotid bruit or JVD.     Trachea: Trachea normal.  Cardiovascular:  Rate and Rhythm: Normal rate and regular rhythm.     Heart sounds: Normal heart sounds, S1 normal and S2 normal. No murmur heard. No gallop.   Pulmonary:     Effort: Pulmonary effort is normal. No accessory muscle usage or respiratory distress.     Breath sounds: Normal breath sounds.  Abdominal:     General: Bowel sounds are normal.     Palpations: Abdomen is soft. There is no hepatomegaly or splenomegaly.     Tenderness: There is no abdominal tenderness.  Musculoskeletal:        General: Normal range of motion.     Cervical back: Normal range of motion and neck supple.     Right lower leg: No edema.     Left lower leg: No edema.  Lymphadenopathy:     Head:     Right side of head: No submental, submandibular, tonsillar, preauricular or posterior auricular adenopathy.     Left side of head: No submental, submandibular, tonsillar, preauricular or posterior auricular adenopathy.     Cervical: No cervical adenopathy.  Skin:    General: Skin is warm and dry.     Capillary Refill: Capillary refill takes less than 2 seconds.     Findings: No rash.  Neurological:     Mental Status: He is alert and oriented to person, place, and time.     Cranial Nerves: Cranial nerves are intact.     Gait: Gait is intact.     Deep Tendon Reflexes: Reflexes are normal and symmetric.     Reflex Scores:      Brachioradialis reflexes are 2+ on the right side and 2+ on the left side.      Patellar reflexes are 2+ on the right side and 2+ on the left side. Psychiatric:        Attention and Perception: Attention normal.        Mood and Affect: Mood normal.        Speech: Speech normal.        Behavior: Behavior normal. Behavior is cooperative.        Thought Content: Thought content normal.        Cognition and Memory: Cognition normal.        Judgment: Judgment normal.     Results for orders placed or performed in  visit on 12/01/20  Anaerobic and Aerobic Culture   Specimen: Skin  Result Value Ref Range   MICRO NUMBER: [id number]    SPECIMEN QUALITY: Adequate    Source: NOT GIVEN    STATUS: FINAL    GRAM STAIN:      Few White blood cells seen No epithelial cells seen No organisms seen   ANA RESULT: No anaerobes isolated.    MICRO NUMBER: [id number]    SPECIMEN QUALITY: Adequate    SOURCE: NOT GIVEN    STATUS: FINAL    AER RESULT:      Growth of skin flora (note: Growth does not include S. aureus, beta-hemolytic Streptococci or P. aeruginosa).   COMMENT:      No source was provided. The specimen was tested and reported based upon the test code ordered. If this is incorrect, please contact client services.      Assessment & Plan:   Problem List Items Addressed This Visit      Cardiovascular and Mediastinum   Essential hypertension    Chronic, stable with BP at goal in office today.  Recommend he monitor BP at least a few mornings  a week at home and document.  DASH diet at home.  Continue current medication regimen and adjust as needed.  Labs today: CMP, TSH, lipid, CBC.  Return in 6 months.       Relevant Medications   lisinopril-hydrochlorothiazide (ZESTORETIC) 10-12.5 MG tablet   Other Relevant Orders   Comprehensive metabolic panel   Lipid Panel w/o Chol/HDL Ratio   TSH     Respiratory   OSA (obstructive sleep apnea)    Chronic, stable with good compliance to CPAP.  Continue to utilize daily.      Allergic rhinitis    Chronic, ongoing.  Continue current medication regimen and adjust as needed.  Avoid elements that cause reactions, such as animals. Continue collaboration with pulmonary.  Return in 6 months.      Relevant Orders   Sed Rate (ESR)   C-reactive protein   ANA w/Reflex if Positive   Mild intermittent asthma with allergic rhinitis    Chronic, stable.  No recent exacerbations.  Followed by pulmonary and allergist. 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  Continue  current medication regimen and adjust as needed, minimal use at this time of Albuterol.  Continue collaboration with pulmonary and review notes.  Is on ACE, if cough or refractory symptoms present then will determine alternate option for BP, such as Amlodipine or Losartan.  CBC today.  Return in 6 months.        Relevant Medications   montelukast (SINGULAIR) 10 MG tablet   Other Relevant Orders   CBC with Differential/Platelet     Digestive   GERD (gastroesophageal reflux disease)    Chronic, stable.  Continue current medication regimen and adjust as needed.  Consider referral to GI if worsening symptoms.  Reports history of multiple EGD.  Is on Pepcid and PPI, this is long standing and also benefits his asthma, as in past reports GERD symptoms exacerbated asthma symptoms.  Mag level today.  Return in 6 months.      Relevant Medications   pantoprazole (PROTONIX) 40 MG tablet   Other Relevant Orders   Magnesium     Musculoskeletal and Integument   Arthritis    Ongoing to back, shoulders, and hips.  Addition of Gabapentin has significantly helped quality of life at this time, continue this regimen and adjust as needed.  Continue Tylenol & Biofreeze as needed, uses minimally and prefers not to take many pain medications.  Recommend focus on weight loss, which would benefit joint pain.  Labs today CBC, ANA, CRP, ESR -- discussed with him.  Return in 6 months.      Relevant Orders   Sed Rate (ESR)   C-reactive protein   ANA w/Reflex if Positive   Cervical spondylosis    Ongoing issue with current ongoing arm numbness bilaterally, worse with driving long hours or using arms for tasks.  Recent MRI did note cervical spondylosis and degenerative disease.  Continue current medication regimen at home, Gabapentin, and adjust as needed.  No red flag signs on exam today.  Has neurosurgery referral in place, he does endorse he may not attend as has fear of surgery due to past experiences.  Return in 6  months, sooner if worsening.  Will attempt to scan old imaging into chart, he brought with him today.        Other   Situs inversus    Diagnosed several years ago per patient.  Continue to monitor and consider referral to ENT if worsening symptoms.  Morbid obesity ([place]) - Primary    BMI 47.67.  Recommended eating smaller high protein, low fat meals more frequently and exercising 30 mins a day 5 times a week with a goal of 10-15lb weight loss in the next 3 months. Patient voiced their understanding and motivation to adhere to these recommendations.        Other Visit Diagnoses    Prostate cancer screening       PSA on labs today, discussed with patient.   Relevant Orders   PSA   Encounter for screening for HIV       HIV screening per one time guideline recommendations on labs today, discussed with patient.   Relevant Orders   Hepatitis C antibody   Need for hepatitis C screening test       Hep C screening per one time guideline recommendations on labs today, discussed with patient.   Relevant Orders   HIV Antibody (routine testing w rflx)   Annual physical exam       Annual labs today and health maintenance reviewed with patient.  He refuses vaccinations due to past experiences.       Discussed aspirin prophylaxis for myocardial infarction prevention and decision was it was not indicated  LABORATORY TESTING:  Health maintenance labs ordered today as discussed above.   The natural history of prostate cancer and ongoing controversy regarding screening and potential treatment outcomes of prostate cancer has been discussed with the patient. The meaning of a false positive PSA and a false negative PSA has been discussed. He indicates understanding of the limitations of this screening test and wishes to proceed with screening PSA testing.   IMMUNIZATIONS:   - Tdap: Tetanus vaccination status reviewed: refused -- need past records - Influenza: Refused - Pneumovax: Refuses -  Prevnar: Not applicable - Zostavax vaccine: Refused  SCREENING: - Colonoscopy: Up to date  -- reports done 2 years ago, need past records Discussed with patient purpose of the colonoscopy is to detect colon cancer at curable precancerous or early stages   - AAA Screening: Not applicable  -Hearing Test: Not applicable  -Spirometry: Not applicable   PATIENT COUNSELING:    Sexuality: Discussed sexually transmitted diseases, partner selection, use of condoms, avoidance of unintended pregnancy  and contraceptive alternatives.   Advised to avoid cigarette smoking.  I discussed with the patient that most people either abstain from alcohol or drink within safe limits (<=14/week and <=4 drinks/occasion for males, <=7/weeks and <= 3 drinks/occasion for females) and that the risk for alcohol disorders and other health effects rises proportionally with the number of drinks per week and how often a drinker exceeds daily limits.  Discussed cessation/primary prevention of drug use and availability of treatment for abuse.   Diet: Encouraged to adjust caloric intake to maintain  or achieve ideal body weight, to reduce intake of dietary saturated fat and total fat, to limit sodium intake by avoiding high sodium foods and not adding table salt, and to maintain adequate dietary potassium and calcium preferably from fresh fruits, vegetables, and low-fat dairy products.    Stressed the importance of regular exercise  Injury prevention: Discussed safety belts, safety helmets, smoke detector, smoking near bedding or upholstery.   Dental health: Discussed importance of regular tooth brushing, flossing, and dental visits.   Follow up plan: NEXT PREVENTATIVE PHYSICAL DUE IN 1 YEAR. Return in about 6 months (around 09/01/2021) for HTN, BACK PAIN, ASTHMA.

## 2021-03-01 NOTE — Assessment & Plan Note (Signed)
Chronic, stable.  Continue current medication regimen and adjust as needed.  Consider referral to GI if worsening symptoms.  Reports history of multiple EGD.  Is on Pepcid and PPI, this is long standing and also benefits his asthma, as in past reports GERD symptoms exacerbated asthma symptoms.  Mag level today.  Return in 6 months. ?

## 2021-03-01 NOTE — Assessment & Plan Note (Signed)
Ongoing to back, shoulders, and hips.  Addition of Gabapentin has significantly helped quality of life at this time, continue this regimen and adjust as needed.  Continue Tylenol & Biofreeze as needed, uses minimally and prefers not to take many pain medications.  Recommend focus on weight loss, which would benefit joint pain.  Labs today CBC, ANA, CRP, ESR -- discussed with him.  Return in 6 months. 

## 2021-03-01 NOTE — Assessment & Plan Note (Signed)
Chronic, stable with BP at goal in office today.  Recommend he monitor BP at least a few mornings a week at home and document.  DASH diet at home.  Continue current medication regimen and adjust as needed.  Labs today: CMP, TSH, lipid, CBC.  Return in 6 months. ? ?

## 2021-03-01 NOTE — Telephone Encounter (Signed)
Medication arrived to clinic 

## 2021-03-02 ENCOUNTER — Emergency Department (HOSPITAL_COMMUNITY): Payer: BLUE CROSS/BLUE SHIELD

## 2021-03-02 ENCOUNTER — Emergency Department (HOSPITAL_COMMUNITY)
Admission: EM | Admit: 2021-03-02 | Discharge: 2021-03-02 | Disposition: A | Discharge status: Home / Self Care | Payer: BLUE CROSS/BLUE SHIELD | Attending: Emergency Medicine | Admitting: Emergency Medicine

## 2021-03-02 DIAGNOSIS — S80212A Abrasion, left knee, initial encounter: Secondary | ICD-10-CM | POA: Insufficient documentation

## 2021-03-02 DIAGNOSIS — S50312A Abrasion of left elbow, initial encounter: Secondary | ICD-10-CM | POA: Diagnosis not present

## 2021-03-02 DIAGNOSIS — Z20822 Contact with and (suspected) exposure to covid-19: Secondary | ICD-10-CM | POA: Insufficient documentation

## 2021-03-02 DIAGNOSIS — S3992XA Unspecified injury of lower back, initial encounter: Secondary | ICD-10-CM | POA: Diagnosis present

## 2021-03-02 DIAGNOSIS — S90412A Abrasion, left great toe, initial encounter: Secondary | ICD-10-CM | POA: Diagnosis not present

## 2021-03-02 DIAGNOSIS — T07XXXA Unspecified multiple injuries, initial encounter: Secondary | ICD-10-CM

## 2021-03-02 DIAGNOSIS — S20311A Abrasion of right front wall of thorax, initial encounter: Secondary | ICD-10-CM | POA: Diagnosis not present

## 2021-03-02 DIAGNOSIS — S32009A Unspecified fracture of unspecified lumbar vertebra, initial encounter for closed fracture: Secondary | ICD-10-CM

## 2021-03-02 DIAGNOSIS — S50311A Abrasion of right elbow, initial encounter: Secondary | ICD-10-CM | POA: Insufficient documentation

## 2021-03-02 DIAGNOSIS — S80211A Abrasion, right knee, initial encounter: Secondary | ICD-10-CM | POA: Diagnosis not present

## 2021-03-02 DIAGNOSIS — R29898 Other symptoms and signs involving the musculoskeletal system: Secondary | ICD-10-CM

## 2021-03-02 DIAGNOSIS — S30811A Abrasion of abdominal wall, initial encounter: Secondary | ICD-10-CM | POA: Insufficient documentation

## 2021-03-02 DIAGNOSIS — Y92410 Unspecified street and highway as the place of occurrence of the external cause: Secondary | ICD-10-CM | POA: Insufficient documentation

## 2021-03-02 DIAGNOSIS — T1490XA Injury, unspecified, initial encounter: Secondary | ICD-10-CM

## 2021-03-02 DIAGNOSIS — S0081XA Abrasion of other part of head, initial encounter: Secondary | ICD-10-CM | POA: Insufficient documentation

## 2021-03-02 HISTORY — DX: Other symptoms and signs involving the musculoskeletal system: R29.898

## 2021-03-02 HISTORY — DX: Unspecified injury of lower back, initial encounter: S39.92XA

## 2021-03-02 LAB — I-STAT CHEM 8, ED
BUN: 32 mg/dL — ABNORMAL HIGH (ref 8–23)
Calcium, Ion: 1.16 mmol/L (ref 1.15–1.40)
Chloride: 103 mmol/L (ref 98–111)
Creatinine, Ser: 1.1 mg/dL (ref 0.61–1.24)
Glucose, Bld: 118 mg/dL — ABNORMAL HIGH (ref 70–99)
HCT: 43 % (ref 39.0–52.0)
Hemoglobin: 14.6 g/dL (ref 13.0–17.0)
Potassium: 3.9 mmol/L (ref 3.5–5.1)
Sodium: 139 mmol/L (ref 135–145)
TCO2: 28 mmol/L (ref 22–32)

## 2021-03-02 LAB — CBC WITH DIFFERENTIAL/PLATELET
Basophils Absolute: 0.1 10*3/uL (ref 0.0–0.2)
Basos: 1 %
EOS (ABSOLUTE): 0.2 10*3/uL (ref 0.0–0.4)
Eos: 3 %
Hematocrit: 46.8 % (ref 37.5–51.0)
Hemoglobin: 15.8 g/dL (ref 13.0–17.7)
Immature Grans (Abs): 0 10*3/uL (ref 0.0–0.1)
Immature Granulocytes: 0 %
Lymphocytes Absolute: 1.6 10*3/uL (ref 0.7–3.1)
Lymphs: 17 %
MCH: 30.5 pg (ref 26.6–33.0)
MCHC: 33.8 g/dL (ref 31.5–35.7)
MCV: 90 fL (ref 79–97)
Monocytes Absolute: 0.9 10*3/uL (ref 0.1–0.9)
Monocytes: 10 %
Neutrophils Absolute: 6.6 10*3/uL (ref 1.4–7.0)
Neutrophils: 69 %
Platelets: 260 10*3/uL (ref 150–450)
RBC: 5.18 x10E6/uL (ref 4.14–5.80)
RDW: 12.5 % (ref 11.6–15.4)
WBC: 9.5 10*3/uL (ref 3.4–10.8)

## 2021-03-02 LAB — COMPREHENSIVE METABOLIC PANEL
ALT: 21 IU/L (ref 0–44)
ALT: 28 U/L (ref 0–44)
AST: 20 IU/L (ref 0–40)
AST: 29 U/L (ref 15–41)
Albumin/Globulin Ratio: 2.1 (ref 1.2–2.2)
Albumin: 4.7 g/dL (ref 3.8–4.8)
Albumin: 3.9 g/dL (ref 3.5–5.0)
Alkaline Phosphatase: 82 IU/L (ref 44–121)
Alkaline Phosphatase: 57 U/L (ref 38–126)
Anion gap: 10 (ref 5–15)
BUN/Creatinine Ratio: 29 — ABNORMAL HIGH (ref 10–24)
BUN: 29 mg/dL — ABNORMAL HIGH (ref 8–27)
BUN: 28 mg/dL — ABNORMAL HIGH (ref 8–23)
Bilirubin Total: 0.2 mg/dL (ref 0.0–1.2)
CO2: 20 mmol/L (ref 20–29)
CO2: 25 mmol/L (ref 22–32)
Calcium: 9.6 mg/dL (ref 8.6–10.2)
Calcium: 9.1 mg/dL (ref 8.9–10.3)
Chloride: 97 mmol/L (ref 96–106)
Chloride: 102 mmol/L (ref 98–111)
Creatinine, Ser: 1.01 mg/dL (ref 0.76–1.27)
Creatinine, Ser: 1.19 mg/dL (ref 0.61–1.24)
GFR, Estimated: >60 mL/min (ref >60)
Globulin, Total: 2.2 g/dL (ref 1.5–4.5)
Glucose, Bld: 121 mg/dL — ABNORMAL HIGH (ref 70–99)
Glucose: 93 mg/dL (ref 65–99)
Potassium: 4.3 mmol/L (ref 3.5–5.2)
Potassium: 4 mmol/L (ref 3.5–5.1)
Sodium: 137 mmol/L (ref 134–144)
Sodium: 137 mmol/L (ref 135–145)
Total Bilirubin: 0.6 mg/dL (ref 0.3–1.2)
Total Protein: 6.9 g/dL (ref 6.0–8.5)
Total Protein: 6.7 g/dL (ref 6.5–8.1)
eGFR: 84 mL/min/{1.73_m2} (ref >59)

## 2021-03-02 LAB — ANA W/REFLEX IF POSITIVE: Anti Nuclear Antibody (ANA): NEGATIVE

## 2021-03-02 LAB — URINALYSIS, ROUTINE W REFLEX MICROSCOPIC
Bilirubin Urine: NEGATIVE
Glucose, UA: NEGATIVE mg/dL
Ketones, ur: 5 mg/dL — AB
Leukocytes,Ua: NEGATIVE
Nitrite: NEGATIVE
Protein, ur: NEGATIVE mg/dL
Specific Gravity, Urine: 1.042 — ABNORMAL HIGH (ref 1.005–1.030)
pH: 5 (ref 5.0–8.0)

## 2021-03-02 LAB — CBC
HCT: 45 % (ref 39.0–52.0)
Hemoglobin: 14.9 g/dL (ref 13.0–17.0)
MCH: 30.3 pg (ref 26.0–34.0)
MCHC: 33.1 g/dL (ref 30.0–36.0)
MCV: 91.6 fL (ref 80.0–100.0)
Platelets: 262 10*3/uL (ref 150–400)
RBC: 4.91 MIL/uL (ref 4.22–5.81)
RDW: 12.5 % (ref 11.5–15.5)
WBC: 13.7 10*3/uL — ABNORMAL HIGH (ref 4.0–10.5)
nRBC: 0 % (ref 0.0–0.2)

## 2021-03-02 LAB — SEDIMENTATION RATE: Sed Rate: 7 mm/hr (ref 0–30)

## 2021-03-02 LAB — PROTIME-INR
INR: 1 (ref 0.8–1.2)
Prothrombin Time: 13.6 seconds (ref 11.4–15.2)

## 2021-03-02 LAB — HEPATITIS C ANTIBODY: Hep C Virus Ab: <0.1 s/co ratio (ref 0.0–0.9)

## 2021-03-02 LAB — RESP PANEL BY RT-PCR (FLU A&B, COVID) ARPGX2
Influenza A by PCR: NEGATIVE
Influenza B by PCR: NEGATIVE
SARS Coronavirus 2 by RT PCR: NEGATIVE

## 2021-03-02 LAB — MAGNESIUM: Magnesium: 2.3 mg/dL (ref 1.6–2.3)

## 2021-03-02 LAB — HIV ANTIBODY (ROUTINE TESTING W REFLEX): HIV Screen 4th Generation wRfx: NONREACTIVE

## 2021-03-02 LAB — LIPID PANEL W/O CHOL/HDL RATIO
Cholesterol, Total: 183 mg/dL (ref 100–199)
HDL: 45 mg/dL (ref >39)
LDL Chol Calc (NIH): 113 mg/dL — ABNORMAL HIGH (ref 0–99)
Triglycerides: 141 mg/dL (ref 0–149)
VLDL Cholesterol Cal: 25 mg/dL (ref 5–40)

## 2021-03-02 LAB — LACTIC ACID, PLASMA: Lactic Acid, Venous: 1.4 mmol/L (ref 0.5–1.9)

## 2021-03-02 LAB — TSH: TSH: 1.32 u[IU]/mL (ref 0.450–4.500)

## 2021-03-02 LAB — C-REACTIVE PROTEIN: CRP: 3 mg/L (ref 0–10)

## 2021-03-02 LAB — SAMPLE TO BLOOD BANK

## 2021-03-02 LAB — PSA: Prostate Specific Ag, Serum: 1.5 ng/mL (ref 0.0–4.0)

## 2021-03-02 MED ORDER — IOHEXOL 300 MG/ML  SOLN
100.0000 mL | Freq: Once | INTRAMUSCULAR | Status: AC | PRN
  Administered 2021-03-02: 100 mL via INTRAVENOUS

## 2021-03-02 MED ORDER — OXYCODONE-ACETAMINOPHEN 5-325 MG PO TABS: 1.0000 | ORAL_TABLET | ORAL | 0 refills | Status: DC | PRN

## 2021-03-02 MED ORDER — OXYCODONE-ACETAMINOPHEN 5-325 MG PO TABS
2.0000 | ORAL_TABLET | Freq: Once | ORAL | Status: AC
Start: 2021-03-02 — End: 2021-03-02
  Administered 2021-03-02: 2 via ORAL
  Filled 2021-03-02: qty 2

## 2021-03-02 NOTE — ED Notes (Signed)
PT was taken for a walk from trama C to the nurses desk and back., PT stated that he felt fine just a little stiff on his right side, he stated he wasn't dizzy, nor in pain , just stiff. PT did not appear to have any struggles during the walk.
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
## 2021-03-02 NOTE — Progress Notes (Signed)
Contacted via MyChart The 10-year ASCVD risk score Jose Bussing DC Jr., et al., 2013) is: 13.5%   Values used to calculate the score:     Age: 65 years     Sex: Male     Is Non-Hispanic African American: No     Diabetic: No     Tobacco smoker: No     Systolic Blood Pressure: 039 mmHg     Is BP treated: Yes     HDL Cholesterol: 45 mg/dL     Total Cholesterol: 183 mg/dL   Good evening Jose Liu, your labs have returned and overall they look great -- including kidney function, eGFR and creatinine, and liver function, AST and ALT.  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  The only thing changed on these labs is LDL is elevated.  Your LDL is above normal. The LDL is the bad cholesterol. Over time and in combination with inflammation and other factors, this contributes to plaque which in turn may lead to stroke and/or heart attack down the road. Sometimes high LDL is primarily genetic, and people might be eating all the right foods but still have high numbers. Other times, there is room for improvement in one's diet and eating healthier can bring this number down and potentially reduce one's risk of heart attack and/or stroke.   To reduce your LDL, Remember - more fruits and vegetables, more fish, and limit red meat and dairy products. More soy, nuts, beans, barley, lentils, oats and plant sterol ester enriched margarine instead of butter. I also encourage eliminating sugar and processed food. Remember, shop on the outside of the grocery store and visit your [employer]. If you would like to talk with me about dietary changes plus or minus medications for your cholesterol, please let me know. We should recheck your cholesterol in 6 months.  Any questions? Keep being amazing!  Thank you for allowing me to participate in your care.  I appreciate you. Kindest regards, Jose Liu

## 2021-03-02 NOTE — ED Notes (Signed)
PT was taken for a walk from [facility]

## 2021-03-03 ENCOUNTER — Encounter: Payer: Self-pay | Admitting: Nurse Practitioner

## 2021-03-03 DIAGNOSIS — I700 Atherosclerosis of aorta: Secondary | ICD-10-CM | POA: Insufficient documentation

## 2021-03-06 ENCOUNTER — Other Ambulatory Visit: Payer: Self-pay | Admitting: Emergency Medicine

## 2021-03-06 ENCOUNTER — Ambulatory Visit (INDEPENDENT_AMBULATORY_CARE_PROVIDER_SITE_OTHER): Payer: No Typology Code available for payment source | Admitting: Nurse Practitioner

## 2021-03-06 ENCOUNTER — Other Ambulatory Visit: Payer: Self-pay

## 2021-03-06 ENCOUNTER — Encounter: Payer: Self-pay | Admitting: Nurse Practitioner

## 2021-03-06 VITALS — BP 128/69 | HR 84 | Temp 98.8°F | Wt 340.0 lb

## 2021-03-06 DIAGNOSIS — M545 Low back pain, unspecified: Secondary | ICD-10-CM

## 2021-03-06 MED ORDER — OXYCODONE-ACETAMINOPHEN 5-325 MG PO TABS: 1.0000 | ORAL_TABLET | ORAL | 0 refills | Status: DC | PRN
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
## 2021-03-09 ENCOUNTER — Ambulatory Visit: Payer: No Typology Code available for payment source | Admitting: Pharmacist

## 2021-03-09 ENCOUNTER — Ambulatory Visit: Payer: No Typology Code available for payment source

## 2021-03-09 ENCOUNTER — Other Ambulatory Visit: Payer: Self-pay

## 2021-03-09 DIAGNOSIS — J4520 Mild intermittent asthma, uncomplicated: Secondary | ICD-10-CM

## 2021-03-09 MED ORDER — OMALIZUMAB 150 MG/ML ~~LOC~~ SOSY: 300.0000 mg | PREFILLED_SYRINGE | SUBCUTANEOUS | 4 refills | Status: DC
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
## 2021-03-09 NOTE — Patient Instructions (Addendum)
Your next Xolair dose is due 04/06/21, and every 4 weeks thereafter. The phone number for [facility] is: [phone]  Please call them to schedule shipment once you've used your last 2 syringes!  The Office of Patient Experience phone number is: [phone]  The infusion center team will be in touch with you.  Remember the 5 C's:  COUNTER - leave on the counter at least 30 minutes but up to overnight to bring medication to room temperature. This may help prevent stinging  COLD - place something cold (like an ice gel pack or cold water bottle) on the injection site just before cleansing with alcohol. This may help reduce pain  CLARITIN - use Claritin (generic name is loratadine) for the first two weeks of treatment or the day of, the day before, and the day after injecting. This will help to minimize injection site reactions  CORTISONE CREAM - apply if injection site is irritated and itching  CALL ME - if injection site reaction is bigger than the size of your fist, looks infected, blisters, or if you develop hives

## 2021-03-10 ENCOUNTER — Telehealth: Payer: Self-pay | Admitting: Nurse Practitioner

## 2021-03-10 MED ORDER — OXYCODONE-ACETAMINOPHEN 5-325 MG PO TABS: 1.0000 | ORAL_TABLET | ORAL | 0 refills | Status: DC | PRN

## 2021-03-10 NOTE — Telephone Encounter (Signed)
Medication: oxyCODONE-acetaminophen (PERCOCET) 5-325 MG tablet  Has the pt contacted their pharmacy? no Pt is needing today .  Pt states he was told to call and it would be sent in. Preferred pharmacy: [id number] - [place], [place]  Please be advised refills may take up to 3 business days.  We ask that you follow up with your pharmacy.

## 2021-03-10 NOTE — Telephone Encounter (Signed)
Refill sent, patient checks MyChart -- will be aware

## 2021-03-15 ENCOUNTER — Encounter: Payer: Self-pay | Admitting: Nurse Practitioner

## 2021-03-15 ENCOUNTER — Ambulatory Visit: Payer: No Typology Code available for payment source | Admitting: Nurse Practitioner

## 2021-03-15 ENCOUNTER — Other Ambulatory Visit: Payer: Self-pay

## 2021-03-15 VITALS — BP 132/79 | HR 80 | Temp 99.0°F | Wt 333.4 lb

## 2021-03-15 DIAGNOSIS — S32049D Unspecified fracture of fourth lumbar vertebra, subsequent encounter for fracture with routine healing: Secondary | ICD-10-CM | POA: Insufficient documentation

## 2021-03-15 DIAGNOSIS — M25511 Pain in right shoulder: Secondary | ICD-10-CM | POA: Diagnosis not present

## 2021-03-15 DIAGNOSIS — S32048D Other fracture of fourth lumbar vertebra, subsequent encounter for fracture with routine healing: Secondary | ICD-10-CM

## 2021-03-15 DIAGNOSIS — Z8781 Personal history of (healed) traumatic fracture: Secondary | ICD-10-CM | POA: Insufficient documentation

## 2021-03-15 DIAGNOSIS — I700 Atherosclerosis of aorta: Secondary | ICD-10-CM

## 2021-03-15 MED ORDER — OXYCODONE-ACETAMINOPHEN 5-325 MG PO TABS: 1.0000 | ORAL_TABLET | ORAL | 0 refills | Status: AC | PRN

## 2021-03-15 MED ORDER — TIZANIDINE HCL 4 MG PO TABS: 4.0000 mg | ORAL_TABLET | Freq: Four times a day (QID) | ORAL | 0 refills | Status: DC | PRN

## 2021-03-15 NOTE — Telephone Encounter (Signed)
Called patient and left him a message to call back. I am trying to establish why he needs to be seen. According to referral records from pcp, he has been having chest pain. Dr. Reece Agar doesn't have any appointments soon and if he is having chest pain, he may need to see someone else in the practice sooner

## 2021-03-15 NOTE — Telephone Encounter (Signed)
Patient called and is requesting to see Dr. Brantley Fling.  It has been several years since he was seen.  Please advise.

## 2021-03-15 NOTE — Patient Instructions (Signed)
Acute Back Pain, Adult Acute back pain is sudden and usually short-lived. It is often caused by an injury to the muscles and tissues in the back. The injury may result from:  A muscle or ligament getting overstretched or torn (strained). Ligaments are tissues that connect bones to each other. Lifting something improperly can cause a back strain.  Wear and tear (degeneration) of the spinal disks. Spinal disks are circular tissue that provide cushioning between the bones of the spine (vertebrae).  Twisting motions, such as while playing sports or doing yard work.  A hit to the back.  Arthritis. You may have a physical exam, lab tests, and imaging tests to find the cause of your pain. Acute back pain usually goes away with rest and home care. Follow these instructions at home: Managing pain, stiffness, and swelling  Treatment may include medicines for pain and inflammation that are taken by mouth or applied to the skin, prescription pain medicine, or muscle relaxants. Take over-the-counter and prescription medicines only as told by your health care provider.  Your health care provider may recommend applying ice during the first 24-48 hours after your pain starts. To do this: ? Put ice in a plastic bag. ? Place a towel between your skin and the bag. ? Leave the ice on for 20 minutes, 2-3 times a day.  If directed, apply heat to the affected area as often as told by your health care provider. Use the heat source that your health care provider recommends, such as a moist heat pack or a heating pad. ? Place a towel between your skin and the heat source. ? Leave the heat on for 20-30 minutes. ? Remove the heat if your skin turns bright red. This is especially important if you are unable to feel pain, heat, or cold. You have a greater risk of getting burned. Activity  Do not stay in bed. Staying in bed for more than 1-2 days can delay your recovery.  Sit up and stand up straight. Avoid leaning  forward when you sit or hunching over when you stand. ? If you work at a desk, sit close to it so you do not need to lean over. Keep your chin tucked in. Keep your neck drawn back, and keep your elbows bent at a 90-degree angle (right angle). ? Sit high and close to the steering wheel when you drive. Add lower back (lumbar) support to your car seat, if needed.  Take short walks on even surfaces as soon as you are able. Try to increase the length of time you walk each day.  Do not sit, drive, or stand in one place for more than 30 minutes at a time. Sitting or standing for long periods of time can put stress on your back.  Do not drive or use heavy machinery while taking prescription pain medicine.  Use proper lifting techniques. When you bend and lift, use positions that put less stress on your back: ? Bend your knees. ? Keep the load close to your body. ? Avoid twisting.  Exercise regularly as told by your health care provider. Exercising helps your back heal faster and helps prevent back injuries by keeping muscles strong and flexible.  Work with a physical therapist to make a safe exercise program, as recommended by your health care provider. Do any exercises as told by your physical therapist.   Lifestyle  Maintain a healthy weight. Extra weight puts stress on your back and makes it difficult to have   good posture.  Avoid activities or situations that make you feel anxious or stressed. Stress and anxiety increase muscle tension and can make back pain worse. Learn ways to manage anxiety and stress, such as through exercise. General instructions  Sleep on a firm mattress in a comfortable position. Try lying on your side with your knees slightly bent. If you lie on your back, put a pillow under your knees.  Follow your treatment plan as told by your health care provider. This may include: ? Cognitive or behavioral therapy. ? Acupuncture or massage therapy. ? Meditation or yoga. Contact  a health care provider if:  You have pain that is not relieved with rest or medicine.  You have increasing pain going down into your legs or buttocks.  Your pain does not improve after 2 weeks.  You have pain at night.  You lose weight without trying.  You have a fever or chills. Get help right away if:  You develop new bowel or bladder control problems.  You have unusual weakness or numbness in your arms or legs.  You develop nausea or vomiting.  You develop abdominal pain.  You feel faint. Summary  Acute back pain is sudden and usually short-lived.  Use proper lifting techniques. When you bend and lift, use positions that put less stress on your back.  Take over-the-counter and prescription medicines and apply heat or ice as directed by your health care provider. This information is not intended to replace advice given to you by your health care provider. Make sure you discuss any questions you have with your health care provider. Document Revised: 07/08/2020 Document Reviewed: 07/08/2020 Elsevier Patient Education  2021 [employer]  
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
## 2021-03-15 NOTE — Assessment & Plan Note (Signed)
BMI 47.84.  Recommended eating smaller high protein, low fat meals more frequently and exercising 30 mins a day 5 times a week with a goal of 10-15lb weight loss in the next 3 months. Patient voiced their understanding and motivation to adhere to these recommendations.

## 2021-03-16 NOTE — Telephone Encounter (Signed)
Patient called back and informed me that he has not been having chest pain. He thinks it was from picking up something heavy. He doesn't want to come in to see Dr. Reece Agar until November. I will send this note to scheduling to schedule appointment
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
## 2021-03-22 ENCOUNTER — Ambulatory Visit: Payer: No Typology Code available for payment source | Admitting: Nurse Practitioner

## 2021-03-30 ENCOUNTER — Other Ambulatory Visit: Payer: Self-pay | Admitting: Nurse Practitioner

## 2021-03-30 MED ORDER — LISINOPRIL-HYDROCHLOROTHIAZIDE 10-12.5 MG PO TABS: 2.0000 | ORAL_TABLET | Freq: Every day | ORAL | 4 refills | Status: DC

## 2021-04-06 ENCOUNTER — Ambulatory Visit: Payer: BLUE CROSS/BLUE SHIELD | Admitting: Neurology

## 2021-04-10 ENCOUNTER — Ambulatory Visit: Payer: No Typology Code available for payment source | Admitting: Nurse Practitioner

## 2021-04-11 ENCOUNTER — Other Ambulatory Visit: Payer: Self-pay | Admitting: Nurse Practitioner

## 2021-04-11 ENCOUNTER — Other Ambulatory Visit: Payer: Self-pay

## 2021-04-11 ENCOUNTER — Other Ambulatory Visit: Payer: No Typology Code available for payment source

## 2021-04-11 ENCOUNTER — Ambulatory Visit: Payer: No Typology Code available for payment source | Admitting: Nurse Practitioner

## 2021-04-11 DIAGNOSIS — E7800 Pure hypercholesterolemia, unspecified: Secondary | ICD-10-CM

## 2021-04-12 LAB — LIPID PANEL W/O CHOL/HDL RATIO
Cholesterol, Total: 165 mg/dL (ref 100–199)
HDL: 66 mg/dL (ref >39)
LDL Chol Calc (NIH): 88 mg/dL (ref 0–99)
Triglycerides: 54 mg/dL (ref 0–149)
VLDL Cholesterol Cal: 11 mg/dL (ref 5–40)

## 2021-04-12 LAB — COMPREHENSIVE METABOLIC PANEL
ALT: 13 IU/L (ref 0–44)
AST: 16 IU/L (ref 0–40)
Albumin/Globulin Ratio: 2 (ref 1.2–2.2)
Albumin: 4.3 g/dL (ref 3.8–4.8)
Alkaline Phosphatase: 67 IU/L (ref 44–121)
BUN/Creatinine Ratio: 38 — ABNORMAL HIGH (ref 10–24)
BUN: 32 mg/dL — ABNORMAL HIGH (ref 8–27)
Bilirubin Total: 0.4 mg/dL (ref 0.0–1.2)
CO2: 22 mmol/L (ref 20–29)
Calcium: 9.2 mg/dL (ref 8.6–10.2)
Chloride: 101 mmol/L (ref 96–106)
Creatinine, Ser: 0.84 mg/dL (ref 0.76–1.27)
Globulin, Total: 2.2 g/dL (ref 1.5–4.5)
Glucose: 99 mg/dL (ref 65–99)
Potassium: 4.4 mmol/L (ref 3.5–5.2)
Sodium: 139 mmol/L (ref 134–144)
Total Protein: 6.5 g/dL (ref 6.0–8.5)
eGFR: 98 mL/min/{1.73_m2} (ref >59)

## 2021-04-12 NOTE — Progress Notes (Signed)
Contacted via MyChart   Good afternoon Jose Liu, your labs have returned and are much better.  LDL, bad cholesterol, was 113 and now 88 and total cholesterol 165.  Kidney function, creatining and eGFR, and liver function, AST and ALT, are normal.  Any questions? Keep being stellar!!  Thank you for allowing me to participate in your care.  I appreciate you. Kindest regards, Ludwig Tugwell

## 2021-04-14 ENCOUNTER — Other Ambulatory Visit: Payer: Self-pay | Admitting: Nurse Practitioner

## 2021-04-14 MED ORDER — ALBUTEROL SULFATE HFA 108 (90 BASE) MCG/ACT IN AERS: 1.0000 | INHALATION_SPRAY | Freq: Four times a day (QID) | RESPIRATORY_TRACT | 5 refills | Status: DC | PRN

## 2021-05-05 ENCOUNTER — Encounter: Payer: Self-pay | Admitting: Nurse Practitioner

## 2021-05-05 ENCOUNTER — Ambulatory Visit (INDEPENDENT_AMBULATORY_CARE_PROVIDER_SITE_OTHER): Payer: No Typology Code available for payment source | Admitting: Nurse Practitioner

## 2021-05-05 ENCOUNTER — Other Ambulatory Visit: Payer: Self-pay

## 2021-05-05 DIAGNOSIS — S32048D Other fracture of fourth lumbar vertebra, subsequent encounter for fracture with routine healing: Secondary | ICD-10-CM | POA: Diagnosis not present

## 2021-05-05 DIAGNOSIS — M25511 Pain in right shoulder: Secondary | ICD-10-CM | POA: Diagnosis not present

## 2021-05-05 DIAGNOSIS — K4090 Unilateral inguinal hernia, without obstruction or gangrene, not specified as recurrent: Secondary | ICD-10-CM | POA: Insufficient documentation

## 2021-05-05 NOTE — Assessment & Plan Note (Signed)
Noted on imaging with no symptoms at this time.  Recommend continue wearing supportive underwear and monitoring, if pain presents alert provider immediately.

## 2021-05-05 NOTE — Assessment & Plan Note (Signed)
BMI 47.87.  Recommended eating smaller high protein, low fat meals more frequently and exercising 30 mins a day 5 times a week with a goal of 10-15lb weight loss in the next 3 months. Patient voiced their understanding and motivation to adhere to these recommendations.

## 2021-05-05 NOTE — Patient Instructions (Signed)
Preventing High Cholesterol Cholesterol is a white, waxy substance similar to fat that the human body needs to help build cells. The liver makes all the cholesterol that a person's body needs. Having high cholesterol (hypercholesterolemia) increases your risk for heart disease and stroke. Extra or excess cholesterolcomes from the food that you eat. High cholesterol can often be prevented with diet and lifestyle changes. If you already have high cholesterol, you can control it with diet, lifestyle changes,and medicines. How can high cholesterol affect me? If you have high cholesterol, fatty deposits (plaques) may build up on the walls of your blood vessels. The blood vessels that carry blood away from your heart are called arteries. Plaques make the arteries narrower and stiffer. This in turn can: Restrict or block blood flow and cause blood clots to form. Increase your risk for heart attack and stroke. What can increase my risk for high cholesterol? This condition is more likely to develop in people who: Eat foods that are high in saturated fat or cholesterol. Saturated fat is mostly found in foods that come from animal sources. Are overweight. Are not getting enough exercise. Have a family history of high cholesterol (familial hypercholesterolemia). What actions can I take to prevent this? Nutrition  Eat less saturated fat. Avoid trans fats (partially hydrogenated oils). These are often found in margarine and in some baked goods, fried foods, and snacks bought in packages. Avoid precooked or cured meat, such as bacon, sausages, or meat loaves. Avoid foods and drinks that have added sugars. Eat more fruits, vegetables, and whole grains. Choose healthy sources of protein, such as fish, poultry, lean cuts of red meat, beans, peas, lentils, and nuts. Choose healthy sources of fat, such as: Nuts. Vegetable oils, especially olive oil. Fish that have healthy fats, such as omega-3 fatty acids.  These fish include mackerel or salmon.  Lifestyle Lose weight if you are overweight. Maintaining a healthy body mass index (BMI) can help prevent or control high cholesterol. It can also lower your risk for diabetes and high blood pressure. Ask your health care provider to help you with a diet and exercise plan to lose weight safely. Do not use any products that contain nicotine or tobacco, such as cigarettes, e-cigarettes, and chewing tobacco. If you need help quitting, ask your health care provider. Alcohol use Do not drink alcohol if: Your health care provider tells you not to drink. You are pregnant, may be pregnant, or are planning to become pregnant. If you drink alcohol: Limit how much you use to: 0-1 drink a day for women. 0-2 drinks a day for men. Be aware of how much alcohol is in your drink. In the [place] one drink equals one 12 oz bottle of beer (355 mL), one 5 oz glass of wine (148 mL), or one 1 oz glass of hard liquor (44 mL). Activity  Get enough exercise. Do exercises as told by your health care provider. Each week, do at least 150 minutes of exercise that takes a medium level of effort (moderate-intensity exercise). This kind of exercise: Makes your heart beat faster while allowing you to still be able to talk. Can be done in short sessions several times a day or longer sessions a few times a week. For example, on 5 days each week, you could walk fast or ride your bike 3 times a day for 10 minutes each time.  Medicines Your health care provider may recommend medicines to help lower cholesterol. This may be a medicine to lower  the amount of cholesterol that your liver makes. You may need medicine if: Diet and lifestyle changes have not lowered your cholesterol enough. You have high cholesterol and other risk factors for heart disease or stroke. Take over-the-counter and prescription medicines only as told by your health care provider. General information Manage your risk  factors for high cholesterol. Talk with your health care provider about all your risk factors and how to lower your risk. Manage other conditions that you have, such as diabetes or high blood pressure (hypertension). Have blood tests to check your cholesterol levels at regular points in time as told by your health care provider. Keep all follow-up visits as told by your health care provider. This is important. Where to find more information American Heart Association: www.heart.org National Heart, Lung, and Blood Institute: https://wilson-eaton.com/ Summary High cholesterol increases your risk for heart disease and stroke. By keeping your cholesterol level low, you can reduce your risk for these conditions. High cholesterol can often be prevented with diet and lifestyle changes. Work with your health care provider to manage your risk factors, and have your blood tested regularly. This information is not intended to replace advice given to you by your health care provider. Make sure you discuss any questions you have with your healthcare provider. Document Revised: 07/28/2019 Document Reviewed: 07/28/2019 Elsevier Patient Education  [place].

## 2021-05-05 NOTE — Assessment & Plan Note (Signed)
Overall improving symptoms and pain, continue home PT and simple measures at home.
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
## 2021-05-30 ENCOUNTER — Telehealth: Payer: Self-pay | Admitting: Pharmacy Technician

## 2021-05-30 DIAGNOSIS — J4520 Mild intermittent asthma, uncomplicated: Secondary | ICD-10-CM

## 2021-05-30 MED ORDER — OMALIZUMAB 150 MG/ML ~~LOC~~ SOSY: 300.0000 mg | PREFILLED_SYRINGE | SUBCUTANEOUS | 2 refills | Status: DC

## 2021-05-30 NOTE — Telephone Encounter (Signed)
Spoke to [facility], Patient called pharmacy to schedule Xolair shipment to deliver to him on 06/02/21. Patient is requesting to receive a 3 months supply.   Pharmacy is requesting a new prescription to dispense the 3 month supply. Can be e-scribed to [facility].  Routing to Triage since pharmacist is out of office until Thursday.  Thanks!

## 2021-05-30 NOTE — Telephone Encounter (Signed)
3 mth supply Xolair RX has been sent to [facility] pharmacy.
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
## 2021-08-07 ENCOUNTER — Telehealth: Payer: Self-pay | Admitting: Nurse Practitioner
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
## 2021-08-08 NOTE — Telephone Encounter (Signed)
Patient rescheduled.  //CC

## 2021-08-10 ENCOUNTER — Ambulatory Visit: Payer: No Typology Code available for payment source | Admitting: Nurse Practitioner

## 2021-08-10 ENCOUNTER — Encounter: Payer: Self-pay | Admitting: Nurse Practitioner

## 2021-08-10 ENCOUNTER — Ambulatory Visit
Admission: RE | Admit: 2021-08-10 | Discharge: 2021-08-10 | Disposition: A | Discharge status: Home / Self Care | Payer: BLUE CROSS/BLUE SHIELD | Source: Ambulatory Visit | Attending: Nurse Practitioner | Admitting: Nurse Practitioner

## 2021-08-10 ENCOUNTER — Other Ambulatory Visit: Payer: Self-pay

## 2021-08-10 DIAGNOSIS — H259 Unspecified age-related cataract: Secondary | ICD-10-CM

## 2021-08-10 DIAGNOSIS — I8390 Asymptomatic varicose veins of unspecified lower extremity: Secondary | ICD-10-CM | POA: Diagnosis not present

## 2021-08-10 DIAGNOSIS — H269 Unspecified cataract: Secondary | ICD-10-CM | POA: Insufficient documentation

## 2021-08-10 DIAGNOSIS — G5603 Carpal tunnel syndrome, bilateral upper limbs: Secondary | ICD-10-CM

## 2021-08-10 NOTE — Assessment & Plan Note (Signed)
Low suspicion for DVT, will obtain imaging for reassurance.  Vascular ultrasound ordered.  Appears more like varicose vein with mild inflammation, no pain.  Monitor closely.

## 2021-08-10 NOTE — Progress Notes (Signed)
Contacted via MyChart   Good evening Jose Liu, I have just returned home and wanted to notify you that your imaging showed no DVT -- very good news.  Your vessels all looked good.  I suspect this is an irritated varicose vein, if no pain then continue to monitor it closely.  Any questions? Keep being incredible!!  Thank you for allowing me to participate in your care.  I appreciate you. Kindest regards, Emmons Toth
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
## 2021-08-10 NOTE — Assessment & Plan Note (Signed)
BMI 46.63.  Recommended eating smaller high protein, low fat meals more frequently and exercising 30 mins a day 5 times a week with a goal of 10-15lb weight loss in the next 3 months. Patient voiced their understanding and motivation to adhere to these recommendations.

## 2021-08-10 NOTE — Assessment & Plan Note (Signed)
He is scheduled for surgery, recommend he move forward with this to improve vision.
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
## 2021-08-10 NOTE — Patient Instructions (Signed)
Carpal Tunnel Syndrome Carpal tunnel syndrome is a condition that causes pain, weakness, and numbness in your hand and arm. Numbness is when you cannot feel an area in your body. The carpal tunnel is a narrow area that is on the palm side of your wrist. Repeated wrist motion or certain diseases may cause swelling in the tunnel. This swelling can pinch the main nerve in the wrist. This nerve is called the median nerve. What are the causes? This condition may be caused by: Moving your hand and wrist over and over again while doing a task. Injury to the wrist. Arthritis. A sac of fluid (cyst) or abnormal growth (tumor) in the carpal tunnel. Fluid buildup during pregnancy. Use of tools that vibrate. Sometimes the cause is not known. What increases the risk? The following factors may make you more likely to have this condition: Having a job that makes you do these things: Move your hand over and over again. Work with tools that vibrate, such as drills or sanders. Being a woman. Having diabetes, obesity, thyroid problems, or kidney failure. What are the signs or symptoms? Symptoms of this condition include: A tingling feeling in your fingers. Tingling or loss of feeling in your hand. Pain in your entire arm. This pain may get worse when you bend your wrist and elbow for a long time. Pain in your wrist that goes up your arm to your shoulder. Pain that goes down into your palm or fingers. Weakness in your hands. You may find it hard to grab and hold items. You may feel worse at night. How is this treated? This condition may be treated with: Lifestyle changes. You will be asked to stop or change the activity that caused your problem. Doing exercises and activities that make bones, muscles, and tendons stronger (physical therapy). Learning how to use your hand again (occupational therapy). Medicines for pain and swelling. You may have injections in your wrist. A wrist splint or  brace. Surgery. Follow these instructions at home: If you have a splint or brace: Wear the splint or brace as told by your doctor. Take it off only as told by your doctor. Loosen the splint if your fingers: Tingle. Become numb. Turn cold and blue. Keep the splint or brace clean. If the splint or brace is not waterproof: Do not let it get wet. Cover it with a watertight covering when you take a bath or a shower. Managing pain, stiffness, and swelling If told, put ice on the painful area: If you have a removable splint or brace, remove it as told by your doctor. Put ice in a plastic bag. Place a towel between your skin and the bag. Leave the ice on for 20 minutes, 2-3 times per day. Do not fall asleep with the cold pack on your skin. Take off the ice if your skin turns bright red. This is very important. If you cannot feel pain, heat, or cold, you have a greater risk of damage to the area. Move your fingers often to reduce stiffness and swelling. General instructions Take over-the-counter and prescription medicines only as told by your doctor. Rest your wrist from any activity that may cause pain. If needed, talk with your boss at work about changes that can help your wrist heal. Do exercises as told by your doctor, physical therapist, or occupational therapist. Keep all follow-up visits. Contact a doctor if: You have new symptoms. Medicine does not help your pain. Your symptoms get worse. Get help right away  if: You have very bad numbness or tingling in your wrist or hand. Summary Carpal tunnel syndrome is a condition that causes pain in your hand and arm. It is often caused by repeated wrist motions. Lifestyle changes and medicines are used to treat this problem. Surgery may help in very bad cases. Follow your doctor's instructions about wearing a splint, resting your wrist, keeping follow-up visits, and calling for help. This information is not intended to replace advice given  to you by your health care provider. Make sure you discuss any questions you have with your health care provider. Document Revised: 02/25/2020 Document Reviewed: 02/25/2020 Elsevier Patient Education  [place].

## 2021-08-17 ENCOUNTER — Ambulatory Visit
Admit: 2021-08-17 | Discharge: 2021-08-17 | Payer: BLUE CROSS/BLUE SHIELD | Attending: Cardiovascular Disease | Primary: Family Medicine

## 2021-08-17 DIAGNOSIS — R0609 Other forms of dyspnea: Secondary | ICD-10-CM

## 2021-08-17 NOTE — Progress Notes (Signed)
[facility]  [address], [address]  [place], [place] [address]      08/17/21      NAME:  Steven [name]  DOB: Oct 03, 1956  MRN: [id number]      SUBJECTIVE:   Steven [name] is a 65 y.o. male seen for a follow up visit regarding the following:     Chief Complaint   Patient presents with    Hypertension    Hyperlipidemia       HPI:   66 y.o. history of hypertension dyslipidemia and morbid obesity.  He was seen by me in 2013 with CT coronary angiogram demonstrating calcium score of 0 and normal coronaries.  He has not been seen in approximately 10 years.  He presents now with some increasing episodes of dyspnea on exertion fatigue and weakness.  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  He has a strong family history of coronary disease and presents for work-up due to worsening dyspnea.  He reports recent weight loss of almost 30 pounds.  He reports compliance with CPAP for obstructive sleep apnea.          Past Medical History, Past Surgical History, Family history, Social History, and Medications were all reviewed with the patient today and updated as necessary.     Current Outpatient Medications   Medication Sig Dispense Refill    OMEPRAZOLE PO Take 20 mg by mouth      aspirin 81 MG chewable tablet Take 81 mg by mouth      atorvastatin (LIPITOR) 40 MG tablet Take by mouth      DULoxetine (CYMBALTA) 30 MG extended release capsule Take 30 mg by mouth daily      meclizine (ANTIVERT) 25 MG tablet Take 25 mg by mouth 3 times daily as needed      melatonin 3 MG TABS tablet Take by mouth      metFORMIN (GLUCOPHAGE) 1000 MG tablet TAKE ONE TABLET BY MOUTH TWICE A DAY      olmesartan-hydroCHLOROthiazide (BENICAR HCT) 40-12.5 MG per tablet Take 0.5 tablets by mouth daily       No current facility-administered medications for this visit.     Patient Active Problem List    Diagnosis Date Noted    Dyspnea on exertion 08/17/2021      Priority: High    Neuropathy 08/28/2019     Priority: Low    Pre-diabetes      Priority: Low     11/2016, 06/16/15 Annual Dilated Eye Exam shows no diabetic retinopathy        Tear of lateral meniscus of right knee 10/14/2016     Priority: Low    Osteoarthritis of right knee 10/14/2016     Priority: Low    Complete tear of right ACL 10/14/2016     Priority: Low    Morbid obesity (HCC)      Priority: Low     bmi = 40        Hypercholesterolemia      Priority: Low    GERD (gastroesophageal reflux disease)      Priority: Low     occ no meds        Spinal stenosis of lumbar region without neurogenic claudication 11/06/2015     Priority: Low    Essential hypertension, benign      Priority: Low    Spondylolisthesis at L4-L5 level 10/19/2015  Priority: Low    OSA (obstructive sleep apnea) 06/30/2015     Priority: Low    Testicular hypofunction      Priority: Low    Chronic radicular low back pain 09/11/2014     Priority: Low    Chronic left sacroiliac joint pain 08/23/2014     Priority: Low    Mood disorder (HCC) 07/14/2014     Priority: Low    Venous insufficiency of both lower extremities 07/14/2014     Priority: Low    Generalized anxiety disorder      Priority: Low      Family History   Problem Relation Age of Onset    Diabetes Sister     Alzheimer's Disease Mother     Heart Disease Father         atherosclerosis (heavy smoker)    Diabetes Mother      Social History     Tobacco Use    Smoking status: Never    Smokeless tobacco: Never   Substance Use Topics    Alcohol use: Yes     Alcohol/week: 1.0 standard drink       Review Of Symptoms    Review of Systems   Constitutional: Negative for fever and malaise/fatigue.   HENT:  Negative for nosebleeds.    Cardiovascular:  Positive for dyspnea on exertion. Negative for chest pain and palpitations.   Respiratory:  Negative for cough and shortness of breath.    Musculoskeletal:  Negative for back pain, muscle cramps, muscle weakness and myalgias.   Gastrointestinal:  Negative  for abdominal pain.   Neurological:  Negative for dizziness.      Physical Exam  Blood pressure (!) 146/78, pulse 76, height 6' (1.829 m), weight 255 lb (115.7 kg).  Physical Exam  HENT:      Head: Normocephalic and atraumatic.   Eyes:      Extraocular Movements: Extraocular movements intact.   Cardiovascular:      Rate and Rhythm: Normal rate and regular rhythm.      Heart sounds: No murmur heard.  Pulmonary:      Effort: Pulmonary effort is normal.      Breath sounds: Normal breath sounds.   Abdominal:      Palpations: Abdomen is soft.   Musculoskeletal:         General: Normal range of motion.   Skin:     General: Skin is warm and dry.   Neurological:      General: No focal deficit present.      Mental Status: He is oriented to person, place, and time.         Medical problems, medical history and test results were reviewed with the patient today.      No results found for: CHOL  No results found for: TRIG  No results found for: HDL  No results found for: LDLCHOLESTEROL, LDLCALC  No results found for: LABVLDL, VLDL  No results found for: CHOLHDLRATIO     No results found for: LABA1C  No results found for: EAG     Lab Results   Component Value Date    NA 138 04/17/2018    K 4.2 04/17/2018    CL 105 04/17/2018    CO2 23 04/17/2018    BUN 29 (H) 04/17/2018    CREATININE 1.44 04/17/2018    GLUCOSE 112 (H) 04/17/2018    CALCIUM 9.2 04/17/2018    PROT 7.7 04/17/2018    LABALBU 4.2 04/17/2018  BILITOT 0.3 04/17/2018    ALKPHOS 89 04/17/2018    AST 21 04/17/2018    ALT 39 04/17/2018    GFRAA >60 04/17/2018    AGRATIO 1.2 04/17/2018    GLOB 3.5 04/17/2018       Lab Results   Component Value Date/Time    NA 138 04/17/2018 10:02 AM    K 4.2 04/17/2018 10:02 AM    CL 105 04/17/2018 10:02 AM    CO2 23 04/17/2018 10:02 AM    BUN 29 04/17/2018 10:02 AM    CREATININE 1.44 04/17/2018 10:02 AM    GLUCOSE 112 04/17/2018 10:02 AM    CALCIUM 9.2 04/17/2018 10:02 AM        Lab Results   Component Value Date    WBC 4.3 04/17/2018     HGB 14.3 04/17/2018    HCT 41.9 04/17/2018    MCV 91.1 04/17/2018    PLT 148 (L) 04/17/2018             ASSESSMENT:    Steven [name] was seen today for hypertension and hyperlipidemia.    Diagnoses and all orders for this visit:    Dyspnea on exertion -patient with worsening dyspnea on exertion.  Patient is struggled with chronic back pain.  He has now lost weight.  He does report some improvement.  He has a strong family history of premature coronary disease.  Given patient's progressive symptoms and a moderate risk patient will proceed with echocardiogram and stress myocardial perfusion imaging.  -     Transthoracic echocardiogram (TTE) complete with contrast, bubble, strain, and 3D PRN; Future  -     Nuclear stress test with myocardial perfusion; Future    Essential hypertension, benign -pressures been mild to moderately elevated.  He will follow closely at home.  -     EKG 12 Lead    Hypercholesterolemia -continue rosuvastatin    Morbid obesity (HCC) -patient is focusing on diet and weight loss.  He has lost nearly 30 pounds recently.    OSA (obstructive sleep apnea) -patient reports compliance with CPAP    Venous insufficiency of both lower extremities -stable with support stockings.        Problem List Items Addressed This Visit          Circulatory    Venous insufficiency of both lower extremities    Essential hypertension, benign    Relevant Orders    EKG 12 Lead (Completed)       Respiratory    OSA (obstructive sleep apnea)       Other    Dyspnea on exertion - Primary (Chronic)    Relevant Orders    Transthoracic echocardiogram (TTE) complete with contrast, bubble, strain, and 3D PRN    Nuclear stress test with myocardial perfusion    Morbid obesity (HCC)    Hypercholesterolemia       There are no discontinued medications.                Patient has been instructed and agrees to call our office with any issues or other concerns related to their cardiac condition(s) and/or complaint(s).      Return for Return  after testing.       Arnoldo Morale, MD  08/17/2021

## 2021-08-22 ENCOUNTER — Encounter: Payer: Self-pay | Admitting: Ophthalmology

## 2021-08-22 ENCOUNTER — Encounter: Payer: BLUE CROSS/BLUE SHIELD | Primary: Family Medicine

## 2021-08-23 ENCOUNTER — Ambulatory Visit: Admit: 2021-08-23 | Payer: BLUE CROSS/BLUE SHIELD | Primary: Family Medicine

## 2021-08-23 DIAGNOSIS — R0609 Other forms of dyspnea: Secondary | ICD-10-CM

## 2021-08-23 LAB — TRANSTHORACIC ECHOCARDIOGRAM (TTE) COMPLETE (CONTRAST/BUBBLE/3D PRN)
AV Area by Peak Velocity: 2.8 cm2
AV Area by VTI: 2.5 cm2
AV Mean Gradient: 10 mmHg
AV Mean Velocity: 1.5 m/s
AV Peak Gradient: 18 mmHg
AV Peak Velocity: 1.9 m/s
AV VTI: 39.1 cm
AV Velocity Ratio: 0.79
AVA/BSA Peak Velocity: 1.2 cm2/m2
AVA/BSA VTI: 1.1 cm2/m2
Ascending Aorta Index: 1.65 cm/m2
Ascending Aorta: 3.8 cm
Body Surface Area: 2.39 m2
E/E' Lateral: 12.43
E/E' Ratio (Averaged): 13.46
E/E' Septal: 14.5
EF BP: 63 % (ref 55–100)
Fractional Shortening 2D: 38 % (ref 28–44)
IVSd: 1 cm (ref 0.6–1.0)
LA Area 2C: 20.1 cm2
LA Area 4C: 17.2 cm2
LA Diameter: 3.8 cm
LA Major Axis: 4.9 cm
LA Minor Axis: 5.7 cm
LA Size Index: 1.65 cm/m2
LA Volume BP: 55 mL (ref 18–58)
LA Volume Index BP: 24 ml/m2 (ref 16–34)
LV E' Lateral Velocity: 7 cm/s
LV E' Septal Velocity: 6 cm/s
LV EDV A2C: 122 mL
LV EDV A4C: 107 mL
LV EDV Index A2C: 53 mL/m2
LV EDV Index A4C: 46 mL/m2
LV ESV A2C: 50 mL
LV ESV A4C: 33 mL
LV ESV Index A2C: 22 mL/m2
LV ESV Index A4C: 14 mL/m2
LV Mass 2D Index: 78.7 g/m2 (ref 49–115)
LV Mass 2D: 181.9 g (ref 88–224)
LV RWT Ratio: 0.46
LVIDd Index: 2.08 cm/m2
LVIDd: 4.8 cm (ref 4.2–5.9)
LVIDs Index: 1.3 cm/m2
LVIDs: 3 cm
LVOT Area: 3.5 cm2
LVOT Diameter: 2.1 cm
LVOT Mean Gradient: 5 mmHg
LVOT Peak Gradient: 9 mmHg
LVOT Peak Velocity: 1.5 m/s
LVOT SV: 97.3 ml
LVOT Stroke Volume Index: 42.1 mL/m2
LVOT VTI: 28.1 cm
LVOT:AV VTI Index: 0.72
LVPWd: 1.1 cm — AB (ref 0.6–1.0)
Left Ventricular Ejection Fraction: 63
MV A Velocity: 1.06 m/s
MV E Velocity: 0.87 m/s
MV E Wave Deceleration Time: 251 ms
MV E/A: 0.82

## 2021-08-23 MED ORDER — PERFLUTREN LIPID MICROSPHERE IV SUSP
Freq: Once | INTRAVENOUS | Status: AC | PRN
Start: 2021-08-23 — End: 2021-08-23
  Administered 2021-08-23: 20:00:00 1.65 mL via INTRAVENOUS

## 2021-08-28 ENCOUNTER — Ambulatory Visit: Payer: No Typology Code available for payment source | Admitting: Nurse Practitioner

## 2021-08-30 ENCOUNTER — Encounter: Payer: No Typology Code available for payment source | Admitting: Nurse Practitioner

## 2021-08-30 ENCOUNTER — Encounter: Payer: BLUE CROSS/BLUE SHIELD | Primary: Family Medicine

## 2021-08-31 NOTE — Discharge Instructions (Signed)

## 2021-09-01 ENCOUNTER — Encounter: Attending: Cardiovascular Disease | Primary: Family Medicine

## 2021-09-04 ENCOUNTER — Ambulatory Visit: Payer: BLUE CROSS/BLUE SHIELD | Admitting: Anesthesiology

## 2021-09-04 ENCOUNTER — Ambulatory Visit
Admission: RE | Admit: 2021-09-04 | Discharge: 2021-09-04 | Disposition: A | Discharge status: Home / Self Care | Payer: BLUE CROSS/BLUE SHIELD | Source: Ambulatory Visit | Attending: Ophthalmology | Admitting: Ophthalmology

## 2021-09-04 ENCOUNTER — Encounter
Admission: RE | Disposition: A | Discharge status: Home / Self Care | Payer: Self-pay | Source: Ambulatory Visit | Attending: Ophthalmology

## 2021-09-04 ENCOUNTER — Other Ambulatory Visit: Payer: Self-pay

## 2021-09-04 ENCOUNTER — Encounter: Payer: Self-pay | Admitting: Ophthalmology

## 2021-09-04 DIAGNOSIS — E669 Obesity, unspecified: Secondary | ICD-10-CM | POA: Diagnosis not present

## 2021-09-04 DIAGNOSIS — G4730 Sleep apnea, unspecified: Secondary | ICD-10-CM | POA: Diagnosis not present

## 2021-09-04 DIAGNOSIS — Z6841 Body Mass Index (BMI) 40.0 and over, adult: Secondary | ICD-10-CM | POA: Diagnosis not present

## 2021-09-04 DIAGNOSIS — I10 Essential (primary) hypertension: Secondary | ICD-10-CM | POA: Insufficient documentation

## 2021-09-04 DIAGNOSIS — J45909 Unspecified asthma, uncomplicated: Secondary | ICD-10-CM | POA: Diagnosis not present

## 2021-09-04 DIAGNOSIS — H2511 Age-related nuclear cataract, right eye: Secondary | ICD-10-CM | POA: Diagnosis present

## 2021-09-04 HISTORY — DX: Carpal tunnel syndrome, unspecified upper limb: G56.00

## 2021-09-04 HISTORY — PX: CATARACT EXTRACTION W/PHACO: SHX586

## 2021-09-04 HISTORY — DX: Other complications of anesthesia, initial encounter: T88.59XA

## 2021-09-04 HISTORY — DX: Dizziness and giddiness: R42

## 2021-09-04 SURGERY — PHACOEMULSIFICATION, CATARACT, WITH IOL INSERTION
Anesthesia: Monitor Anesthesia Care | Site: Eye | Laterality: Right

## 2021-09-04 MED ORDER — CEFUROXIME OPHTHALMIC INJECTION 1 MG/0.1 ML
INJECTION | OPHTHALMIC | Status: DC | PRN
  Administered 2021-09-04: 0.1 mL via INTRACAMERAL

## 2021-09-04 MED ORDER — LACTATED RINGERS IV SOLN: INTRAVENOUS | Status: DC

## 2021-09-04 MED ORDER — FENTANYL CITRATE (PF) 100 MCG/2ML IJ SOLN
INTRAMUSCULAR | Status: DC | PRN
  Administered 2021-09-04: 100 ug via INTRAVENOUS

## 2021-09-04 MED ORDER — SIGHTPATH DOSE#1 SODIUM HYALURONATE 23 MG/ML IO SOLUTION
PREFILLED_SYRINGE | INTRAOCULAR | Status: DC | PRN
  Administered 2021-09-04: 0.6 mL via INTRAOCULAR

## 2021-09-04 MED ORDER — TETRACAINE HCL 0.5 % OP SOLN
1.0000 [drp] | OPHTHALMIC | Status: DC | PRN
  Administered 2021-09-04 (×3): 1 [drp] via OPHTHALMIC

## 2021-09-04 MED ORDER — SIGHTPATH DOSE#1 BSS IO SOLN
INTRAOCULAR | Status: DC | PRN
  Administered 2021-09-04: 15 mL

## 2021-09-04 MED ORDER — SIGHTPATH DOSE#1 BSS IO SOLN
INTRAOCULAR | Status: DC | PRN
  Administered 2021-09-04: 63 mL via OPHTHALMIC

## 2021-09-04 MED ORDER — SIGHTPATH DOSE#1 SODIUM HYALURONATE 10 MG/ML IO SOLUTION
PREFILLED_SYRINGE | INTRAOCULAR | Status: DC | PRN
  Administered 2021-09-04: 0.85 mL via INTRAOCULAR

## 2021-09-04 MED ORDER — LIDOCAINE HCL (PF) 2 % IJ SOLN
INTRAOCULAR | Status: DC | PRN
  Administered 2021-09-04: 1 mL via INTRAOCULAR

## 2021-09-04 MED ORDER — DEXMEDETOMIDINE (PRECEDEX) IN NS 20 MCG/5ML (4 MCG/ML) IV SYRINGE
PREFILLED_SYRINGE | INTRAVENOUS | Status: DC | PRN
  Administered 2021-09-04: 10 ug via INTRAVENOUS

## 2021-09-04 MED ORDER — MIDAZOLAM HCL 2 MG/2ML IJ SOLN
INTRAMUSCULAR | Status: DC | PRN
  Administered 2021-09-04: 2 mg via INTRAVENOUS

## 2021-09-04 MED ORDER — ARMC OPHTHALMIC DILATING DROPS
1.0000 "application " | OPHTHALMIC | Status: AC | PRN
  Administered 2021-09-04 (×3): 1 via OPHTHALMIC

## 2021-09-04 MED ORDER — ONDANSETRON HCL 4 MG/2ML IJ SOLN: 4.0000 mg | Freq: Once | INTRAMUSCULAR | Status: DC | PRN

## 2021-09-04 MED ORDER — ACETAMINOPHEN 10 MG/ML IV SOLN: 1000.0000 mg | Freq: Once | INTRAVENOUS | Status: DC | PRN

## 2021-09-04 SURGICAL SUPPLY — 14 items
CANNULA ANT/CHMB 27GA (MISCELLANEOUS) IMPLANT
DISSECTOR HYDRO NUCLEUS 50X22 (MISCELLANEOUS) ×2 IMPLANT
GLOVE SURG GAMMEX PI TX LF 7.5 (GLOVE) ×2 IMPLANT
GLOVE SURG SYN 8.5  E (GLOVE) ×1
GLOVE SURG SYN 8.5 E (GLOVE) ×1 IMPLANT
GOWN STRL REUS W/ TWL LRG LVL3 (GOWN DISPOSABLE) ×2 IMPLANT
GOWN STRL REUS W/TWL LRG LVL3 (GOWN DISPOSABLE) ×4
LENS IOL TECNIS EYHANCE 21.5 (Intraocular Lens) ×2 IMPLANT
MARKER SKIN DUAL TIP RULER LAB (MISCELLANEOUS) IMPLANT
PACK EYE AFTER SURG (MISCELLANEOUS) ×2 IMPLANT
SYR 3ML LL SCALE MARK (SYRINGE) ×2 IMPLANT
SYR TB 1ML LUER SLIP (SYRINGE) ×2 IMPLANT
WATER STERILE IRR 250ML POUR (IV SOLUTION) ×2 IMPLANT
WIPE NON LINTING 3.25X3.25 (MISCELLANEOUS) ×2 IMPLANT

## 2021-09-04 NOTE — Anesthesia Procedure Notes (Signed)
Procedure Name: MAC Date/Time: 09/04/2021 10:46 AM Performed by: Jeannene Patella, CRNA Pre-anesthesia Checklist: Patient identified, Emergency Drugs available, Suction available, Timeout performed and Patient being monitored Patient Re-evaluated:Patient Re-evaluated prior to induction Oxygen Delivery Method: Nasal cannula Placement Confirmation: positive ETCO2

## 2021-09-04 NOTE — Op Note (Signed)
OPERATIVE NOTE  Jose Liu [id number] 09/04/2021   PREOPERATIVE DIAGNOSIS:  Nuclear sclerotic cataract right eye.  H25.11   POSTOPERATIVE DIAGNOSIS:    Nuclear sclerotic cataract right eye.     PROCEDURE:  Phacoemusification with posterior chamber intraocular lens placement of the right eye   LENS:   Implant Name Type Inv. Item Serial No. Manufacturer Lot No. LRB No. Used Action  LENS IOL TECNIS EYHANCE 21.5 - [id number] Intraocular Lens LENS IOL TECNIS EYHANCE 21.5 [id number] JOHNSON   Right 1 Implanted       Procedure(s): CATARACT EXTRACTION PHACO AND INTRAOCULAR LENS PLACEMENT (IOC) RIGHT 4.30 00:28.0 (Right)  DIB00 +21.5     SURGEON:  Benay Pillow, MD, MPH  ANESTHESIOLOGIST: Anesthesiologist: Heniser, Fredric Dine, MD CRNA: Jeannene Patella, CRNA   ANESTHESIA:  Topical with tetracaine drops augmented with 1% preservative-free intracameral lidocaine.  ESTIMATED BLOOD LOSS: less than 1 mL.   COMPLICATIONS:  None.   DESCRIPTION OF PROCEDURE:  The patient was identified in the holding room and transported to the operating room and placed in the supine position under the operating microscope.  The right eye was identified as the operative eye and it was prepped and draped in the usual sterile ophthalmic fashion.   A 1.0 millimeter clear-corneal paracentesis was made at the 10:30 position. 0.5 ml of preservative-free 1% lidocaine with epinephrine was injected into the anterior chamber.  The anterior chamber was filled with Healon 5 viscoelastic.  A 2.4 millimeter keratome was used to make a near-clear corneal incision at the 8:00 position.  A curvilinear capsulorrhexis was made with a cystotome and capsulorrhexis forceps.  Balanced salt solution was used to hydrodissect and hydrodelineate the nucleus.   Phacoemulsification was then used in stop and chop fashion to remove the lens nucleus and epinucleus.  The remaining cortex was then removed using the irrigation and aspiration  handpiece. Healon was then placed into the capsular bag to distend it for lens placement.  A lens was then injected into the capsular bag.  The remaining viscoelastic was aspirated.   Wounds were hydrated with balanced salt solution.  The anterior chamber was inflated to a physiologic pressure with balanced salt solution.   Intracameral cefuroxime 0.1 mL at 10 mg/mL was injected into the eye.  No wound leaks were noted.  The patient was taken to the recovery room in stable condition without complications of anesthesia or surgery  Benay Pillow 09/04/2021, 11:01 AM

## 2021-09-04 NOTE — Anesthesia Postprocedure Evaluation (Signed)
Anesthesia Post Note  Patient: Jose Liu  Procedure(s) Performed: CATARACT EXTRACTION PHACO AND INTRAOCULAR LENS PLACEMENT (IOC) RIGHT 4.30 00:28.0 (Right: Eye)     Patient location during evaluation: PACU Anesthesia Type: MAC Level of consciousness: awake and alert Pain management: pain level controlled Vital Signs Assessment: post-procedure vital signs reviewed and stable Respiratory status: spontaneous breathing, nonlabored ventilation, respiratory function stable and patient connected to nasal cannula oxygen Cardiovascular status: stable and blood pressure returned to baseline Postop Assessment: no apparent nausea or vomiting Anesthetic complications: no   No notable events documented.  Pilar Corrales A  [name]
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
## 2021-09-04 NOTE — Transfer of Care (Signed)
Immediate Anesthesia Transfer of Care Note  Patient: Jose Liu  Procedure(s) Performed: CATARACT EXTRACTION PHACO AND INTRAOCULAR LENS PLACEMENT (IOC) RIGHT 4.30 00:28.0 (Right: Eye)  Patient Location: PACU  Anesthesia Type: MAC  Level of Consciousness: awake, alert  and patient cooperative  Airway and Oxygen Therapy: Patient Spontanous Breathing and Patient connected to supplemental oxygen  Post-op Assessment: Post-op Vital signs reviewed, Patient's Cardiovascular Status Stable, Respiratory Function Stable, Patent Airway and No signs of Nausea or vomiting  Post-op Vital Signs: Reviewed and stable  Complications: No notable events documented.

## 2021-09-04 NOTE — Anesthesia Preprocedure Evaluation (Signed)
Anesthesia Evaluation  Patient identified by MRN, date of birth, ID band Patient awake    Reviewed: Allergy & Precautions, NPO status , Patient's Chart, lab work & pertinent test results, reviewed documented beta blocker date and time   History of Anesthesia Complications Negative for: history of anesthetic complications  Airway Mallampati: II  TM Distance: >3 FB Neck ROM: Limited    Dental   Pulmonary asthma , sleep apnea ,    breath sounds clear to auscultation       Cardiovascular hypertension, (-) angina+ DOE (Chronic, stable)   Rhythm:Regular Rate:Normal     Neuro/Psych  Neuromuscular disease (Carpal tunnel, cervical spondylosis)    GI/Hepatic GERD  ,  Endo/Other    Renal/GU      Musculoskeletal  (+) Arthritis ,   Abdominal (+) + obese (BMI 44),   Peds  Hematology   Anesthesia Other Findings   Reproductive/Obstetrics                           Anesthesia Physical Anesthesia Plan  ASA: 3  Anesthesia Plan: MAC   Post-op Pain Management:    Induction: Intravenous  PONV Risk Score and Plan: 1 and TIVA, Midazolam and Treatment may vary due to age or medical condition  Airway Management Planned: Nasal Cannula  Additional Equipment:   Intra-op Plan:   Post-operative Plan:   Informed Consent: I have reviewed the patients History and Physical, chart, labs and discussed the procedure including the risks, benefits and alternatives for the proposed anesthesia with the patient or authorized representative who has indicated his/her understanding and acceptance.       Plan Discussed with: CRNA and Anesthesiologist  Anesthesia Plan Comments:        Anesthesia Quick Evaluation

## 2021-09-05 ENCOUNTER — Encounter: Payer: Self-pay | Admitting: Ophthalmology

## 2021-09-11 ENCOUNTER — Other Ambulatory Visit: Payer: Self-pay

## 2021-09-11 ENCOUNTER — Encounter: Payer: Self-pay | Admitting: Nurse Practitioner

## 2021-09-11 ENCOUNTER — Ambulatory Visit: Payer: No Typology Code available for payment source | Admitting: Nurse Practitioner

## 2021-09-11 DIAGNOSIS — J4520 Mild intermittent asthma, uncomplicated: Secondary | ICD-10-CM

## 2021-09-11 DIAGNOSIS — G4733 Obstructive sleep apnea (adult) (pediatric): Secondary | ICD-10-CM

## 2021-09-11 DIAGNOSIS — Q893 Situs inversus: Secondary | ICD-10-CM

## 2021-09-11 DIAGNOSIS — I10 Essential (primary) hypertension: Secondary | ICD-10-CM

## 2021-09-11 DIAGNOSIS — K219 Gastro-esophageal reflux disease without esophagitis: Secondary | ICD-10-CM

## 2021-09-11 MED ORDER — LISINOPRIL-HYDROCHLOROTHIAZIDE 10-12.5 MG PO TABS: 1.0000 | ORAL_TABLET | Freq: Every day | ORAL | 4 refills | Status: DC

## 2021-09-11 NOTE — Assessment & Plan Note (Signed)
Chronic, stable with BP at goal in office today.  Recommend he monitor BP at least a few mornings a week at home and document.  DASH diet at home.  Continue current medication regimen and adjust as needed.  Labs today: CMP, lipid, CBC.  Return in 6 months.

## 2021-09-11 NOTE — Progress Notes (Signed)
BP 133/67   Pulse 88   Wt (!) 320 lb 9.6 oz (145.4 kg)   SpO2 98%   BMI 44.71 kg/m    Subjective:    Patient ID: Jose Liu, male    DOB: 1955/12/27, 65 y.o.   MRN: [id number]  HPI: Jose Liu is a 65 y.o. male  Chief Complaint  Patient presents with   Gastroesophageal Reflux   Asthma   Hyperlipidemia   Hypertension    Patient states he has been taking 1 tablet for his blood pressure and would like to discuss with provider as his bottle says he is supposed to 2 tablets of the blood pressure medication. Patient states he has never taken 2 tablets always taken 1 tablet.    Would like provider to look in ears today, feels like something is moving in right ear -- flushes them out at home.  HYPERTENSION Currently taking Lisinopril-HCTZ 10-12.5 MG taking 1 tablet daily -- he has never taken two, there was a point where medications transferred wrong in chart.   Has underlying situs inversus.  Diagnosed with OSA and uses CPAP 100% of the time. Hypertension status: stable  Satisfied with current treatment? yes Duration of hypertension: chronic BP monitoring frequency:  daily BP range: 120/70's on average BP medication side effects:  no Medication compliance: good compliance Previous BP meds: Losartan-HCTZ Aspirin: no Recurrent headaches: no Visual changes: no Palpitations: no Dyspnea: no Chest pain: no Lower extremity edema: no Dizzy/lightheaded: no  The 10-year ASCVD risk score (Arnett DK, et al., 2019) is: 10.8%   Values used to calculate the score:     Age: 74 years     Sex: Male     Is Non-Hispanic African American: No     Diabetic: No     Tobacco smoker: No     Systolic Blood Pressure: 563 mmHg     Is BP treated: Yes     HDL Cholesterol: 66 mg/dL     Total Cholesterol: 165 mg/dL  GERD Continues on Protonix 40 MG daily and Famotidine 40 MG daily.  GERD control status: stable  Satisfied with current treatment? yes Heartburn frequency: none Medication side  effects: no  Medication compliance: stable Previous GERD medications: Antacid use frequency:  none Dysphagia: no Odynophagia:  no Hematemesis: no Blood in stool: no EGD: no    ASTHMA/ALLERGIC RHINITIS Takes Flonase nasal spray and Singulair 10 MG daily.  Followed by pulmonary, Dr. Lamonte Sakai, and last saw on 03/09/21 -- continues on Xolair with benefit.  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  Medications include Singulair, Claritin, and Flonase. Asthma status: stable Satisfied with current treatment?: yes Albuterol/rescue inhaler frequency: once a month use, if around cats Dyspnea frequency: rarely Wheezing frequency: rarely Cough frequency: none Nocturnal symptom frequency: none Limitation of activity: no Current upper respiratory symptoms: no Triggers: cats and dogs, various allergies Home peak flows: none Last Spirometry: unknown Failed/intolerant to following asthma meds: none Asthma meds in past: Albuterol Aerochamber/spacer use: no Visits to ER or Urgent Care in past year: no Pneumovax: refuses Influenza: refuses, make him sick    Relevant past medical, surgical, family and social history reviewed and updated as indicated. Interim medical history since our last visit reviewed. Allergies and medications reviewed and updated.  Review of Systems  Constitutional:  Negative for activity change, diaphoresis, fatigue and fever.  Respiratory:  Negative for cough, chest tightness, shortness of breath and wheezing.   Cardiovascular:  Negative for chest pain, palpitations and leg swelling.  Gastrointestinal:  Negative.   Endocrine: Negative.   Neurological: Negative.   Psychiatric/Behavioral: Negative.     Per HPI unless specifically indicated above     Objective:    BP 133/67   Pulse 88   Wt (!) 320 lb 9.6 oz (145.4 kg)   SpO2 98%   BMI 44.71 kg/m   Wt Readings from Last 3 Encounters:  09/11/21 (!) 320 lb 9.6 oz (145.4 kg)  09/04/21 (!) 315 lb (142.9 kg)  08/10/21 (!) 325 lb (147.4  kg)    Physical Exam Vitals and nursing note reviewed.  Constitutional:      General: He is awake. He is not in acute distress.    Appearance: He is well-developed and well-groomed. He is morbidly obese. He is not ill-appearing.  HENT:     Head: Normocephalic and atraumatic.     Right Ear: Hearing, tympanic membrane, ear canal and external ear normal. No drainage.     Left Ear: Hearing, tympanic membrane, ear canal and external ear normal. No drainage.  Eyes:     General: Lids are normal.        Right eye: No discharge.        Left eye: No discharge.     Conjunctiva/sclera: Conjunctivae normal.     Pupils: Pupils are equal, round, and reactive to light.  Neck:     Thyroid: No thyromegaly.     Vascular: No carotid bruit.     Trachea: Trachea normal.  Cardiovascular:     Rate and Rhythm: Normal rate and regular rhythm.     Heart sounds: Normal heart sounds, S1 normal and S2 normal. No murmur heard.   No gallop.  Pulmonary:     Effort: Pulmonary effort is normal. No accessory muscle usage or respiratory distress.     Breath sounds: Normal breath sounds.  Abdominal:     General: Bowel sounds are normal.     Palpations: Abdomen is soft.  Musculoskeletal:        General: Normal range of motion.     Cervical back: Normal range of motion and neck supple.     Right lower leg: No edema.     Left lower leg: No edema.  Skin:    General: Skin is warm and dry.     Capillary Refill: Capillary refill takes less than 2 seconds.  Neurological:     Mental Status: He is alert and oriented to person, place, and time.     Deep Tendon Reflexes: Reflexes are normal and symmetric.     Reflex Scores:      Brachioradialis reflexes are 2+ on the right side and 2+ on the left side.      Patellar reflexes are 2+ on the right side and 2+ on the left side. Psychiatric:        Attention and Perception: Attention normal.        Mood and Affect: Mood normal.        Speech: Speech normal.         Behavior: Behavior normal. Behavior is cooperative.        Thought Content: Thought content normal.   Results for orders placed or performed in visit on 04/11/21  Comprehensive metabolic panel  Result Value Ref Range   Glucose 99 65 - 99 mg/dL   BUN 32 (H) 8 - 27 mg/dL   Creatinine, Ser 0.84 0.76 - 1.27 mg/dL   eGFR 98 >59 mL/min/1.73   BUN/Creatinine Ratio 38 (H) 10 - 24  Sodium 139 134 - 144 mmol/L   Potassium 4.4 3.5 - 5.2 mmol/L   Chloride 101 96 - 106 mmol/L   CO2 22 20 - 29 mmol/L   Calcium 9.2 8.6 - 10.2 mg/dL   Total Protein 6.5 6.0 - 8.5 g/dL   Albumin 4.3 3.8 - 4.8 g/dL   Globulin, Total 2.2 1.5 - 4.5 g/dL   Albumin/Globulin Ratio 2.0 1.2 - 2.2   Bilirubin Total 0.4 0.0 - 1.2 mg/dL   Alkaline Phosphatase 67 44 - 121 IU/L   AST 16 0 - 40 IU/L   ALT 13 0 - 44 IU/L  Lipid Panel w/o Chol/HDL Ratio  Result Value Ref Range   Cholesterol, Total 165 100 - 199 mg/dL   Triglycerides 54 0 - 149 mg/dL   HDL 66 >39 mg/dL   VLDL Cholesterol Cal 11 5 - 40 mg/dL   LDL Chol Calc (NIH) 88 0 - 99 mg/dL      Assessment & Plan:   Problem List Items Addressed This Visit       Cardiovascular and Mediastinum   Essential hypertension    Chronic, stable with BP at goal in office today.  Recommend he monitor BP at least a few mornings a week at home and document.  DASH diet at home.  Continue current medication regimen and adjust as needed.  Labs today: CMP, lipid, CBC.  Return in 6 months.       Relevant Medications   lisinopril-hydrochlorothiazide (ZESTORETIC) 10-12.5 MG tablet     Respiratory   Mild intermittent asthma with allergic rhinitis    Chronic, stable.  No recent exacerbations.  Followed by pulmonary and allergist. 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  Continue current medication regimen and adjust as needed, minimal use at this time of Albuterol.  Continue collaboration with pulmonary and review notes.  Is on ACE, if cough or refractory symptoms present then will  determine alternate option for BP, such as Amlodipine or Losartan.  CBC today.  Return in 6 months.        OSA (obstructive sleep apnea)    Chronic, stable with good compliance to CPAP.  Continue to utilize daily.        Digestive   GERD (gastroesophageal reflux disease)    Chronic, stable.  Continue current medication regimen and adjust as needed.  Consider referral to GI if worsening symptoms.  Reports history of multiple EGD.  Is on Pepcid and PPI, this is long standing and also benefits his asthma, as in past reports GERD symptoms exacerbated asthma symptoms.  Mag level next visit.  Return in 6 months.        Other   Morbid obesity ([place]) - Primary    BMI 44.71.  Recommended eating smaller high protein, low fat meals more frequently and exercising 30 mins a day 5 times a week with a goal of 10-15lb weight loss in the next 3 months. Patient voiced their understanding and motivation to adhere to these recommendations.       Situs inversus    Diagnosed several years ago per patient.  Continue to monitor and consider referral to ENT if worsening symptoms.        Follow up plan: Return in about 6 months (around 03/11/2022) for Annual Physical.

## 2021-09-11 NOTE — Assessment & Plan Note (Signed)
Chronic, stable with good compliance to CPAP.  Continue to utilize daily. 

## 2021-09-11 NOTE — Assessment & Plan Note (Signed)
Chronic, stable.  Continue current medication regimen and adjust as needed.  Consider referral to GI if worsening symptoms.  Reports history of multiple EGD.  Is on Pepcid and PPI, this is long standing and also benefits his asthma, as in past reports GERD symptoms exacerbated asthma symptoms.  Mag level next visit.  Return in 6 months.

## 2021-09-11 NOTE — Assessment & Plan Note (Signed)
Chronic, stable.  No recent exacerbations.  Followed by pulmonary and allergist. 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  Continue current medication regimen and adjust as needed, minimal use at this time of Albuterol.  Continue collaboration with pulmonary and review notes.  Is on ACE, if cough or refractory symptoms present then will determine alternate option for BP, such as Amlodipine or Losartan.  CBC today.  Return in 6 months.

## 2021-09-11 NOTE — Assessment & Plan Note (Signed)
Diagnosed several years ago per patient.  Continue to monitor and consider referral to ENT if worsening symptoms. 

## 2021-09-11 NOTE — Patient Instructions (Signed)
Low-Purine Eating Plan A low-purine eating plan involves making food choices to limit your intake of purine. Purine is a kind of uric acid. Too much uric acid in your blood can cause certain conditions, such as gout and kidney stones. Eating a low-purinediet can help control these conditions. What are tips for following this plan? Reading food labels Avoid foods with saturated or Trans fat. Check the ingredient list of grains-based foods, such as bread and cereal, to make sure that they contain whole grains. Check the ingredient list of sauces or soups to make sure they do not contain meat or fish. When choosing soft drinks, check the ingredient list to make sure they do not contain high-fructose corn syrup. Shopping  Buy plenty of fresh fruits and vegetables. Avoid buying canned or fresh fish. Buy dairy products labeled as low-fat or nonfat. Avoid buying premade or processed foods. These foods are often high in fat, salt (sodium), and added sugar.  Cooking Use olive oil instead of butter when cooking. Oils like olive oil, canola oil, and sunflower oil contain healthy fats. Meal planning Learn which foods do or do not affect you. If you find out that a food tends to cause your gout symptoms to flare up, avoid eating that food. You can enjoy foods that do not cause problems. If you have any questions about a food item, talk with your dietitian or health care provider. Limit foods high in fat, especially saturated fat. Fat makes it harder for your body to get rid of uric acid. Choose foods that are lower in fat and are lean sources of protein. General guidelines Limit alcohol intake to no more than 1 drink a day for nonpregnant women and 2 drinks a day for men. One drink equals 12 oz of beer, 5 oz of wine, or 1 oz of hard liquor. Alcohol can affect the way your body gets rid of uric acid. Drink plenty of water to keep your urine clear or pale yellow. Fluids can help remove uric acid from your  body. If directed by your health care provider, take a vitamin C supplement. Work with your health care provider and dietitian to develop a plan to achieve or maintain a healthy weight. Losing weight can help reduce uric acid in your blood. What foods are recommended? The items listed may not be a complete list. Talk with your dietitian aboutwhat dietary choices are best for you. Foods low in purines Foods low in purines do not need to be limited. These include: All fruits. All low-purine vegetables, pickles, and olives. Breads, pasta, rice, cornbread, and popcorn. Cake and other baked goods. All dairy foods. Eggs, nuts, and nut butters. Spices and condiments, such as salt, herbs, and vinegar. Plant oils, butter, and margarine. Water, sugar-free soft drinks, tea, coffee, and cocoa. Vegetable-based soups, broths, sauces, and gravies. Foods moderate in purines Foods moderate in purines should be limited to the amounts listed.  cup of asparagus, cauliflower, spinach, mushrooms, or green peas, each day. 2/3 cup uncooked oatmeal, each day.  cup dry wheat bran or wheat germ, each day. 2-3 ounces of meat or poultry, each day. 4-6 ounces of shellfish, such as crab, lobster, oysters, or shrimp, each day. 1 cup cooked beans, peas, or lentils, each day. Soup, broths, or bouillon made from meat or fish. Limit these foods as much as possible. What foods are not recommended? The items listed may not be a complete list. Talk with your dietitian aboutwhat dietary choices are best for you.   Limit your intake of foods high in purines, including: Beer and other alcohol. Meat-based gravy or sauce. Canned or fresh fish, such as: Anchovies, sardines, herring, and tuna. Mussels and scallops. Codfish, trout, and haddock. Bacon. Organ meats, such as: Liver or kidney. Tripe. Sweetbreads (thymus gland or pancreas). Wild game or goose. Yeast or yeast extract supplements. Drinks sweetened with  high-fructose corn syrup. Summary Eating a low-purine diet can help control conditions caused by too much uric acid in the body, such as gout or kidney stones. Choose low-purine foods, limit alcohol, and limit foods high in fat. You will learn over time which foods do or do not affect you. If you find out that a food tends to cause your gout symptoms to flare up, avoid eating that food. This information is not intended to replace advice given to you by your health care provider. Make sure you discuss any questions you have with your healthcare provider. Document Revised: 01/28/2020 Document Reviewed: 01/28/2020 Elsevier Patient Education  2022 Else[employer] Inc.  

## 2021-09-11 NOTE — Assessment & Plan Note (Signed)
BMI 44.71.  Recommended eating smaller high protein, low fat meals more frequently and exercising 30 mins a day 5 times a week with a goal of 10-15lb weight loss in the next 3 months. Patient voiced their understanding and motivation to adhere to these recommendations.

## 2021-09-12 ENCOUNTER — Ambulatory Visit (INDEPENDENT_AMBULATORY_CARE_PROVIDER_SITE_OTHER): Payer: No Typology Code available for payment source | Admitting: Neurology

## 2021-09-12 ENCOUNTER — Encounter: Payer: Self-pay | Admitting: Neurology

## 2021-09-12 VITALS — BP 127/72 | HR 84 | Ht 71.0 in | Wt 325.0 lb

## 2021-09-12 DIAGNOSIS — R202 Paresthesia of skin: Secondary | ICD-10-CM | POA: Diagnosis not present

## 2021-09-12 MED ORDER — GABAPENTIN 300 MG PO CAPS: 600.0000 mg | ORAL_CAPSULE | Freq: Three times a day (TID) | ORAL | 11 refills | Status: DC
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
## 2021-09-13 ENCOUNTER — Encounter

## 2021-09-14 ENCOUNTER — Encounter: Payer: Self-pay | Admitting: Nurse Practitioner

## 2021-09-14 DIAGNOSIS — K4090 Unilateral inguinal hernia, without obstruction or gangrene, not specified as recurrent: Secondary | ICD-10-CM

## 2021-09-25 ENCOUNTER — Inpatient Hospital Stay: Admit: 2021-09-25 | Payer: BLUE CROSS/BLUE SHIELD | Primary: Family Medicine

## 2021-09-25 DIAGNOSIS — R1011 Right upper quadrant pain: Secondary | ICD-10-CM

## 2021-09-26 ENCOUNTER — Other Ambulatory Visit: Payer: Self-pay

## 2021-09-26 ENCOUNTER — Ambulatory Visit (INDEPENDENT_AMBULATORY_CARE_PROVIDER_SITE_OTHER): Payer: No Typology Code available for payment source | Admitting: Surgery

## 2021-09-26 ENCOUNTER — Encounter: Payer: Self-pay | Admitting: Surgery

## 2021-09-26 VITALS — BP 148/80 | HR 99 | Temp 99.5°F | Ht 71.0 in | Wt 323.2 lb

## 2021-09-26 DIAGNOSIS — K4090 Unilateral inguinal hernia, without obstruction or gangrene, not specified as recurrent: Secondary | ICD-10-CM | POA: Diagnosis not present
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
## 2021-09-26 NOTE — Patient Instructions (Addendum)
Please call our office if you have questions or concerns.   

## 2021-09-28 ENCOUNTER — Ambulatory Visit: Payer: BLUE CROSS/BLUE SHIELD | Admitting: Surgery

## 2021-10-09 ENCOUNTER — Other Ambulatory Visit: Payer: Self-pay

## 2021-10-09 ENCOUNTER — Ambulatory Visit: Payer: No Typology Code available for payment source | Admitting: Orthopedic Surgery

## 2021-10-09 DIAGNOSIS — M12812 Other specific arthropathies, not elsewhere classified, left shoulder: Secondary | ICD-10-CM | POA: Diagnosis not present

## 2021-10-10 ENCOUNTER — Encounter: Payer: Self-pay | Admitting: Nurse Practitioner

## 2021-10-10 ENCOUNTER — Encounter: Payer: Self-pay | Admitting: Ophthalmology

## 2021-10-12 ENCOUNTER — Encounter: Payer: Self-pay | Admitting: Orthopedic Surgery
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
## 2021-10-12 NOTE — Discharge Instructions (Signed)

## 2021-10-13 ENCOUNTER — Ambulatory Visit: Payer: BLUE CROSS/BLUE SHIELD | Admitting: Neurology

## 2021-10-16 ENCOUNTER — Encounter
Admission: RE | Disposition: A | Discharge status: Home / Self Care | Payer: Self-pay | Source: Ambulatory Visit | Attending: Ophthalmology

## 2021-10-16 ENCOUNTER — Other Ambulatory Visit: Payer: Self-pay

## 2021-10-16 ENCOUNTER — Encounter: Payer: Self-pay | Admitting: Ophthalmology

## 2021-10-16 ENCOUNTER — Ambulatory Visit: Payer: BLUE CROSS/BLUE SHIELD | Admitting: Anesthesiology

## 2021-10-16 ENCOUNTER — Ambulatory Visit
Admission: RE | Admit: 2021-10-16 | Discharge: 2021-10-16 | Disposition: A | Discharge status: Home / Self Care | Payer: BLUE CROSS/BLUE SHIELD | Source: Ambulatory Visit | Attending: Ophthalmology | Admitting: Ophthalmology

## 2021-10-16 DIAGNOSIS — I10 Essential (primary) hypertension: Secondary | ICD-10-CM | POA: Insufficient documentation

## 2021-10-16 DIAGNOSIS — G4730 Sleep apnea, unspecified: Secondary | ICD-10-CM | POA: Diagnosis not present

## 2021-10-16 DIAGNOSIS — J45909 Unspecified asthma, uncomplicated: Secondary | ICD-10-CM | POA: Insufficient documentation

## 2021-10-16 DIAGNOSIS — H2512 Age-related nuclear cataract, left eye: Secondary | ICD-10-CM | POA: Diagnosis not present

## 2021-10-16 DIAGNOSIS — Z6841 Body Mass Index (BMI) 40.0 and over, adult: Secondary | ICD-10-CM | POA: Diagnosis not present

## 2021-10-16 HISTORY — PX: CATARACT EXTRACTION W/PHACO: SHX586

## 2021-10-16 SURGERY — PHACOEMULSIFICATION, CATARACT, WITH IOL INSERTION
Anesthesia: Monitor Anesthesia Care | Site: Eye | Laterality: Left

## 2021-10-16 MED ORDER — MOXIFLOXACIN HCL 0.5 % OP SOLN
OPHTHALMIC | Status: DC | PRN
  Administered 2021-10-16: 0.2 mL via OPHTHALMIC

## 2021-10-16 MED ORDER — OXYCODONE HCL 5 MG/5ML PO SOLN: 5.0000 mg | Freq: Once | ORAL | Status: DC | PRN

## 2021-10-16 MED ORDER — SIGHTPATH DOSE#1 BSS IO SOLN
INTRAOCULAR | Status: DC | PRN
  Administered 2021-10-16: 09:00:00 58 mL via OPHTHALMIC

## 2021-10-16 MED ORDER — OXYCODONE HCL 5 MG PO TABS: 5.0000 mg | ORAL_TABLET | Freq: Once | ORAL | Status: DC | PRN

## 2021-10-16 MED ORDER — SIGHTPATH DOSE#1 SODIUM HYALURONATE 10 MG/ML IO SOLUTION
PREFILLED_SYRINGE | INTRAOCULAR | Status: DC | PRN
  Administered 2021-10-16: 0.85 mL via INTRAOCULAR

## 2021-10-16 MED ORDER — LIDOCAINE HCL (PF) 2 % IJ SOLN
INTRAOCULAR | Status: DC | PRN
  Administered 2021-10-16: 09:00:00 1 mL via INTRAOCULAR

## 2021-10-16 MED ORDER — SIGHTPATH DOSE#1 BSS IO SOLN
INTRAOCULAR | Status: DC | PRN
  Administered 2021-10-16: 15 mL

## 2021-10-16 MED ORDER — LACTATED RINGERS IV SOLN: INTRAVENOUS | Status: DC

## 2021-10-16 MED ORDER — TETRACAINE HCL 0.5 % OP SOLN
1.0000 [drp] | OPHTHALMIC | Status: DC | PRN
  Administered 2021-10-16 (×3): 1 [drp] via OPHTHALMIC

## 2021-10-16 MED ORDER — SIGHTPATH DOSE#1 SODIUM HYALURONATE 23 MG/ML IO SOLUTION
PREFILLED_SYRINGE | INTRAOCULAR | Status: DC | PRN
  Administered 2021-10-16: 0.6 mL via INTRAOCULAR

## 2021-10-16 MED ORDER — MIDAZOLAM HCL 2 MG/2ML IJ SOLN
INTRAMUSCULAR | Status: DC | PRN
Start: 2021-10-16 — End: 2021-10-16
  Administered 2021-10-16: 2 mg via INTRAVENOUS

## 2021-10-16 MED ORDER — ARMC OPHTHALMIC DILATING DROPS
1.0000 "application " | OPHTHALMIC | Status: DC | PRN
  Administered 2021-10-16 (×3): 1 via OPHTHALMIC

## 2021-10-16 MED ORDER — FENTANYL CITRATE (PF) 100 MCG/2ML IJ SOLN
INTRAMUSCULAR | Status: DC | PRN
  Administered 2021-10-16 (×2): 50 ug via INTRAVENOUS

## 2021-10-16 SURGICAL SUPPLY — 18 items
CANNULA ANT/CHMB 27G (MISCELLANEOUS) IMPLANT
CANNULA ANT/CHMB 27GA (MISCELLANEOUS) IMPLANT
DISSECTOR HYDRO NUCLEUS 50X22 (MISCELLANEOUS) ×3 IMPLANT
GLOVE SURG GAMMEX PI TX LF 7.5 (GLOVE) ×3 IMPLANT
GLOVE SURG SYN 8.5  E (GLOVE) ×2
GLOVE SURG SYN 8.5 E (GLOVE) ×1 IMPLANT
GLOVE SURG SYN 8.5 PF PI (GLOVE) ×1 IMPLANT
LENS IOL TECNIS EYHANCE 21.5 (Intraocular Lens) ×2 IMPLANT
NDL FILTER BLUNT 18X1 1/2 (NEEDLE) ×1 IMPLANT
NEEDLE FILTER BLUNT 18X 1/2SAF (NEEDLE) ×2
NEEDLE FILTER BLUNT 18X1 1/2 (NEEDLE) ×1 IMPLANT
PACK VIT ANT 23G (MISCELLANEOUS) IMPLANT
RING MALYGIN (MISCELLANEOUS) IMPLANT
SUT ETHILON 10-0 CS-B-6CS-B-6 (SUTURE)
SUTURE EHLN 10-0 CS-B-6CS-B-6 (SUTURE) IMPLANT
SYR 3ML LL SCALE MARK (SYRINGE) ×3 IMPLANT
SYR 5ML LL (SYRINGE) ×3 IMPLANT
WATER STERILE IRR 250ML POUR (IV SOLUTION) ×3 IMPLANT

## 2021-10-16 NOTE — Op Note (Signed)
OPERATIVE NOTE  Jose Liu [id number] 10/16/2021   PREOPERATIVE DIAGNOSIS:  Nuclear sclerotic cataract left eye.  H25.12   POSTOPERATIVE DIAGNOSIS:    Nuclear sclerotic cataract left eye.     PROCEDURE:  Phacoemusification with posterior chamber intraocular lens placement of the left eye   LENS:   Implant Name Type Inv. Item Serial No. Manufacturer Lot No. LRB No. Used Action  LENS IOL TECNIS EYHANCE 21.5 - [id number] Intraocular Lens LENS IOL TECNIS EYHANCE 21.5 [id number] JOHNSON   Left 1 Implanted      Procedure(s) with comments: CATARACT EXTRACTION PHACO AND INTRAOCULAR LENS PLACEMENT (IOC) LEFT (Left) - 1.47 0:17.2  DIB00 +21.5   ULTRASOUND TIME: 0 minutes 17 seconds.  CDE 1.47   SURGEON:  Benay Pillow, MD, MPH   ANESTHESIA:  Topical with tetracaine drops augmented with 1% preservative-free intracameral lidocaine.  ESTIMATED BLOOD LOSS: <1 mL   COMPLICATIONS:  None.   DESCRIPTION OF PROCEDURE:  The patient was identified in the holding room and transported to the operating room and placed in the supine position under the operating microscope.  The left eye was identified as the operative eye and it was prepped and draped in the usual sterile ophthalmic fashion.   A 1.0 millimeter clear-corneal paracentesis was made at the 5:00 position. 0.5 ml of preservative-free 1% lidocaine with epinephrine was injected into the anterior chamber.  The anterior chamber was filled with Healon 5 viscoelastic.  A 2.4 millimeter keratome was used to make a near-clear corneal incision at the 2:00 position.  A curvilinear capsulorrhexis was made with a cystotome and capsulorrhexis forceps.  Balanced salt solution was used to hydrodissect and hydrodelineate the nucleus.   Phacoemulsification was then used in stop and chop fashion to remove the lens nucleus and epinucleus.  The remaining cortex was then removed using the irrigation and aspiration handpiece. Healon was then placed into the  capsular bag to distend it for lens placement.  A lens was then injected into the capsular bag.  The remaining viscoelastic was aspirated.   Wounds were hydrated with balanced salt solution.  The anterior chamber was inflated to a physiologic pressure with balanced salt solution.  Intracameral vigamox 0.1 mL undiltued was injected into the eye and a drop placed onto the ocular surface.  No wound leaks were noted.  The patient was taken to the recovery room in stable condition without complications of anesthesia or surgery  Benay Pillow 10/16/2021, 9:32 AM

## 2021-10-16 NOTE — Anesthesia Postprocedure Evaluation (Signed)
Anesthesia Post Note  Patient: Jose Liu  Procedure(s) Performed: CATARACT EXTRACTION PHACO AND INTRAOCULAR LENS PLACEMENT (IOC) LEFT (Left: Eye)     Patient location during evaluation: PACU Anesthesia Type: MAC Level of consciousness: awake and alert Pain management: pain level controlled Vital Signs Assessment: post-procedure vital signs reviewed and stable Respiratory status: spontaneous breathing, nonlabored ventilation, respiratory function stable and patient connected to nasal cannula oxygen Cardiovascular status: stable and blood pressure returned to baseline Postop Assessment: no apparent nausea or vomiting Anesthetic complications: no   No notable events documented.  Fidel Levy

## 2021-10-16 NOTE — Anesthesia Procedure Notes (Signed)
Procedure Name: MAC Date/Time: 10/16/2021 9:15 AM Performed by: Dionne Bucy, CRNA Pre-anesthesia Checklist: Patient identified, Emergency Drugs available, Suction available, Patient being monitored and Timeout performed Patient Re-evaluated:Patient Re-evaluated prior to induction Oxygen Delivery Method: Nasal cannula Placement Confirmation: positive ETCO2
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
## 2021-10-16 NOTE — Transfer of Care (Signed)
Immediate Anesthesia Transfer of Care Note  Patient: [name]  Procedure(s) Performed: CATARACT EXTRACTION PHACO AND INTRAOCULAR LENS PLACEMENT (IOC) LEFT (Left: Eye)  Patient Location: PACU  Anesthesia Type: MAC  Level of Consciousness: awake, alert  and patient cooperative  Airway and Oxygen Therapy: Patient Spontanous Breathing and Patient connected to supplemental oxygen  Post-op Assessment: Post-op Vital signs reviewed, Patient's Cardiovascular Status Stable, Respiratory Function Stable, Patent Airway and No signs of Nausea or vomiting  Post-op Vital Signs: Reviewed and stable  Complications: No notable events documented.
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
## 2021-10-17 ENCOUNTER — Encounter: Payer: Self-pay | Admitting: Ophthalmology

## 2021-10-17 ENCOUNTER — Ambulatory Visit: Payer: BLUE CROSS/BLUE SHIELD | Admitting: Surgery

## 2021-10-25 ENCOUNTER — Encounter: Payer: Self-pay | Admitting: Nurse Practitioner

## 2021-11-01 ENCOUNTER — Other Ambulatory Visit: Payer: Self-pay

## 2021-11-01 ENCOUNTER — Other Ambulatory Visit: Payer: Self-pay | Admitting: Emergency Medicine

## 2021-11-01 ENCOUNTER — Encounter: Payer: Self-pay | Admitting: Emergency Medicine

## 2021-11-01 ENCOUNTER — Ambulatory Visit: Payer: No Typology Code available for payment source | Admitting: Emergency Medicine

## 2021-11-01 DIAGNOSIS — J3081 Allergic rhinitis due to animal (cat) (dog) hair and dander: Secondary | ICD-10-CM | POA: Diagnosis not present

## 2021-11-01 DIAGNOSIS — J4520 Mild intermittent asthma, uncomplicated: Secondary | ICD-10-CM

## 2021-11-01 DIAGNOSIS — G4733 Obstructive sleep apnea (adult) (pediatric): Secondary | ICD-10-CM

## 2021-11-01 DIAGNOSIS — K219 Gastro-esophageal reflux disease without esophagitis: Secondary | ICD-10-CM

## 2021-11-01 MED ORDER — OMALIZUMAB 150 MG/ML ~~LOC~~ SOSY: 300.0000 mg | PREFILLED_SYRINGE | SUBCUTANEOUS | 3 refills | Status: AC

## 2021-11-01 NOTE — Assessment & Plan Note (Signed)
Great compliance with his CPAP.  He has significant clinical benefit, better sleep quality, less daytime sleepiness

## 2021-11-01 NOTE — Assessment & Plan Note (Signed)
He is not using budesonide nebs every day, does use it for stretches when he is more symptomatic.  Talked to him about the option of using it every day on a set schedule.  He may decide to use it regularly during the allergy seasons.  I did encourage him to use it for stretch of time if he is more symptomatic and needs better baseline control (for a week, for example).  Plan to continue his Xolair per his current schedule.  He administers at home.

## 2021-11-01 NOTE — Patient Instructions (Addendum)
We will continue Xolair every 4 weeks as you have been taking it.  We will refill this for you. Use your budesonide nebulizer twice a day.  You should use this for stretches of time when you are more symptomatic.  You may want to consider using it regularly during the spring and fall months. Keep your albuterol available use either 2 puffs or 1 nebulizer treatment when you needed for shortness of breath, chest tightness, wheezing. Continue your Singulair as you have been taking it Continue fluticasone nasal spray as you have been taking it Continue loratadine as you have been taking it Continue your Pepcid and pantoprazole Wear your CPAP every night reliably. Follow with Dr. Lamonte Sakai in 12 months or sooner if you have any problems.
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
## 2021-11-01 NOTE — Assessment & Plan Note (Signed)
Severe GERD in the setting of his situs inversus.  Seems to be adequately controlled on his current regimen.

## 2021-11-01 NOTE — Assessment & Plan Note (Signed)
Continue same regimen including Xolair as above

## 2021-11-22 ENCOUNTER — Ambulatory Visit (INDEPENDENT_AMBULATORY_CARE_PROVIDER_SITE_OTHER): Payer: No Typology Code available for payment source | Admitting: Neurology

## 2021-11-22 ENCOUNTER — Ambulatory Visit: Payer: No Typology Code available for payment source | Admitting: Neurology

## 2021-11-22 ENCOUNTER — Telehealth: Payer: Self-pay | Admitting: Neurology

## 2021-11-22 DIAGNOSIS — G5603 Carpal tunnel syndrome, bilateral upper limbs: Secondary | ICD-10-CM

## 2021-11-22 DIAGNOSIS — R202 Paresthesia of skin: Secondary | ICD-10-CM
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
## 2021-11-22 NOTE — Telephone Encounter (Signed)
Sent to [facility] for the Hand surgery ph # [phone].

## 2021-11-22 NOTE — Procedures (Signed)
Full Name: Jose Liu Gender: Male MRN #: [id number] Date of Birth: 1956/10/07    Visit Date: 11/22/2021 10:32 Age: 66 Years Examining Physician: Marcial Pacas, MD  Referring Physician: Marcial Pacas, MD Height: 5 feet 11 inch Patient History: 66 year old male obesity, presented with progressive worsening bilateral hands paresthesia.  Summary of the test: Nerve conduction study: Bilateral ulnar sensory and motor responses were normal. Bilateral median sensory responses were absent. Bilateral median motor responses showed moderately prolonged distal latency, well-preserved CMAP amplitude.  Electromyography: Selected needle examinations of bilateral upper extremity muscles showed mild chronic neuropathic changes involving bilateral abductor pollicis brevis.  There was no evidence of active denervation. There was no spontaneous activity at bilateral cervical paraspinal muscles.    Conclusion: This is an abnormal study.  There is electrodiagnostic evidence of bilateral median neuropathy across the wrist, consistent with moderate bilateral carpal tunnel syndromes, demyelinating in nature.    ------------------------------- Marcial Pacas, M.D. PhD  [facility] [address] [place], [place] [address] Tel: [phone] Fax: [phone]  Verbal informed consent was obtained from the patient, patient was informed of potential risk of procedure, including bruising, bleeding, hematoma formation, infection, muscle weakness, muscle pain, numbness, among others.        [place]    Nerve / Sites Muscle Latency Ref. Amplitude Ref. Rel Amp Segments Distance Velocity Ref. Area    ms ms mV mV %  cm m/s m/s mVms  L Median - APB     Wrist APB 6.3 ?4.4 6.6 ?4.0 100 Wrist - APB 7   24.8     Upper arm APB 11.0  0.4  6.68 Upper arm - Wrist 23 49 ?49 2.2  R Median - APB     Wrist APB 6.0 ?4.4 6.6 ?4.0 100 Wrist - APB 7   22.1     Upper arm APB 10.6  5.7  86.7 Upper arm -  Wrist 24 52 ?49 20.1  L Ulnar - ADM     Wrist ADM 3.1 ?3.3 9.1 ?6.0 100 Wrist - ADM 7   21.6     B.Elbow ADM 6.5  7.4  81.5 B.Elbow - Wrist 17 51 ?49 15.4     A.Elbow ADM 8.5  6.4  86.8 A.Elbow - B.Elbow 10 50 ?49 13.4           SNC    Nerve / Sites Rec. Site Peak Lat Ref.  Amp Ref. Segments Distance    ms ms V V  cm  L Median - Orthodromic (Dig II, Mid palm)     Dig II Wrist NR ?3.4 NR ?10 Dig II - Wrist 13  R Median - Orthodromic (Dig II, Mid palm)     Dig II Wrist NR ?3.4 NR ?10 Dig II - Wrist 13  L Ulnar - Orthodromic, (Dig V, Mid palm)     Dig V Wrist 3.1 ?3.1 5 ?5 Dig V - Wrist 38           F  Wave    Nerve F Lat Ref.   ms ms  L Ulnar - ADM 29.8 ?32.0       EMG Summary Table    Spontaneous MUAP Recruitment  Muscle IA Fib PSW Fasc Other Amp Dur. Poly Pattern  L. Abductor pollicis brevis Increased None None None _______ Normal Normal Normal Reduced  L. First dorsal interosseous Normal None None None _______ Normal Normal Normal Normal  L. Pronator teres Normal  None None None _______ Normal Normal Normal Normal  L. Biceps brachii Normal None None None _______ Normal Normal Normal Normal  L. Deltoid Normal None None None _______ Normal Normal Normal Normal  L. Triceps brachii Normal None None None _______ Normal Normal Normal Normal  R. First dorsal interosseous Normal None None None _______ Normal Normal Normal Normal  R. Abductor pollicis brevis Increased None None None _______ Normal Normal Normal Reduced  R. Pronator teres Normal None None None _______ Normal Normal Normal Normal  R. Biceps brachii Normal None None None _______ Normal Normal Normal Normal  R. Deltoid Normal None None None _______ Normal Normal Normal Normal  R. Triceps brachii Normal None None None _______ Normal Normal Normal Normal  R. Cervical paraspinals Normal None None None _______ Normal Normal Normal Normal  L. Cervical paraspinals Normal None None None _______ Normal Normal Normal Normal

## 2021-11-23 ENCOUNTER — Encounter: Payer: Self-pay | Admitting: Nurse Practitioner

## 2021-11-23 DIAGNOSIS — R7309 Other abnormal glucose: Secondary | ICD-10-CM | POA: Insufficient documentation

## 2021-11-23 LAB — COMPREHENSIVE METABOLIC PANEL
ALT: 21 IU/L (ref 0–44)
AST: 20 IU/L (ref 0–40)
Albumin/Globulin Ratio: 1.8 (ref 1.2–2.2)
Albumin: 4.2 g/dL (ref 3.8–4.8)
Alkaline Phosphatase: 64 IU/L (ref 44–121)
BUN/Creatinine Ratio: 36 — ABNORMAL HIGH (ref 10–24)
BUN: 28 mg/dL — ABNORMAL HIGH (ref 8–27)
Bilirubin Total: 0.2 mg/dL (ref 0.0–1.2)
CO2: 23 mmol/L (ref 20–29)
Calcium: 9.5 mg/dL (ref 8.6–10.2)
Chloride: 103 mmol/L (ref 96–106)
Creatinine, Ser: 0.77 mg/dL (ref 0.76–1.27)
Globulin, Total: 2.4 g/dL (ref 1.5–4.5)
Glucose: 103 mg/dL — ABNORMAL HIGH (ref 70–99)
Potassium: 4.6 mmol/L (ref 3.5–5.2)
Sodium: 142 mmol/L (ref 134–144)
Total Protein: 6.6 g/dL (ref 6.0–8.5)
eGFR: 100 mL/min/{1.73_m2} (ref >59)

## 2021-11-23 LAB — CBC WITH DIFFERENTIAL/PLATELET
Basophils Absolute: 0.1 10*3/uL (ref 0.0–0.2)
Basos: 1 %
EOS (ABSOLUTE): 0.3 10*3/uL (ref 0.0–0.4)
Eos: 4 %
Hematocrit: 44.8 % (ref 37.5–51.0)
Hemoglobin: 15 g/dL (ref 13.0–17.7)
Immature Grans (Abs): 0 10*3/uL (ref 0.0–0.1)
Immature Granulocytes: 0 %
Lymphocytes Absolute: 1.2 10*3/uL (ref 0.7–3.1)
Lymphs: 18 %
MCH: 30 pg (ref 26.6–33.0)
MCHC: 33.5 g/dL (ref 31.5–35.7)
MCV: 90 fL (ref 79–97)
Monocytes Absolute: 0.7 10*3/uL (ref 0.1–0.9)
Monocytes: 11 %
Neutrophils Absolute: 4.5 10*3/uL (ref 1.4–7.0)
Neutrophils: 66 %
Platelets: 255 10*3/uL (ref 150–450)
RBC: 5 x10E6/uL (ref 4.14–5.80)
RDW: 12.4 % (ref 11.6–15.4)
WBC: 6.8 10*3/uL (ref 3.4–10.8)

## 2021-11-23 LAB — VITAMIN B12: Vitamin B-12: 757 pg/mL (ref 232–1245)

## 2021-11-23 LAB — HGB A1C W/O EAG: Hgb A1c MFr Bld: 5.8 % — ABNORMAL HIGH (ref 4.8–5.6)

## 2021-11-23 LAB — RPR: RPR Ser Ql: NONREACTIVE

## 2021-11-27 NOTE — Telephone Encounter (Signed)
Patient is scheduled to see Dr. Greta Doom on 12/08/21 at 11:20 AM at [facility].

## 2021-12-04 ENCOUNTER — Other Ambulatory Visit: Payer: Self-pay

## 2021-12-04 ENCOUNTER — Ambulatory Visit: Payer: No Typology Code available for payment source | Admitting: Dermatology

## 2021-12-04 DIAGNOSIS — L578 Other skin changes due to chronic exposure to nonionizing radiation: Secondary | ICD-10-CM

## 2021-12-04 DIAGNOSIS — Z1283 Encounter for screening for malignant neoplasm of skin: Secondary | ICD-10-CM | POA: Diagnosis not present

## 2021-12-04 DIAGNOSIS — L814 Other melanin hyperpigmentation: Secondary | ICD-10-CM

## 2021-12-04 DIAGNOSIS — L304 Erythema intertrigo: Secondary | ICD-10-CM

## 2021-12-04 DIAGNOSIS — L570 Actinic keratosis: Secondary | ICD-10-CM | POA: Diagnosis not present

## 2021-12-04 DIAGNOSIS — D2371 Other benign neoplasm of skin of right lower limb, including hip: Secondary | ICD-10-CM

## 2021-12-04 DIAGNOSIS — I872 Venous insufficiency (chronic) (peripheral): Secondary | ICD-10-CM

## 2021-12-04 DIAGNOSIS — R21 Rash and other nonspecific skin eruption: Secondary | ICD-10-CM

## 2021-12-04 DIAGNOSIS — L817 Pigmented purpuric dermatosis: Secondary | ICD-10-CM

## 2021-12-04 DIAGNOSIS — Z808 Family history of malignant neoplasm of other organs or systems: Secondary | ICD-10-CM

## 2021-12-04 DIAGNOSIS — L719 Rosacea, unspecified: Secondary | ICD-10-CM

## 2021-12-04 DIAGNOSIS — D229 Melanocytic nevi, unspecified: Secondary | ICD-10-CM

## 2021-12-04 DIAGNOSIS — D1800 Hemangioma unspecified site: Secondary | ICD-10-CM

## 2021-12-04 DIAGNOSIS — L821 Other seborrheic keratosis: Secondary | ICD-10-CM

## 2021-12-04 MED ORDER — MOMETASONE FUROATE 0.1 % EX CREA: TOPICAL_CREAM | CUTANEOUS | 0 refills | Status: DC

## 2021-12-04 MED ORDER — KETOCONAZOLE 2 % EX CREA: TOPICAL_CREAM | CUTANEOUS | 6 refills | Status: DC

## 2021-12-04 NOTE — Patient Instructions (Signed)

## 2021-12-04 NOTE — Progress Notes (Signed)
New Patient Visit  Subjective  Jose Liu is a 66 y.o. male who presents for the following: Annual Exam (Fhx of skin CA in father - Lesion in the groin has been there for a couple of months patient would like checked today. When it was warmer it was swollen and did occasionally bleed. ). The patient presents for Total-Body Skin Exam (TBSE) for skin cancer screening and mole check.  The patient has spots, moles and lesions to be evaluated, some may be new or changing.   The following portions of the chart were reviewed this encounter and updated as appropriate:   Tobacco   Allergies   Meds   Problems   Med Hx   Surg Hx   Fam Hx      Review of Systems:  No other skin or systemic complaints except as noted in HPI or Assessment and Plan.  Objective  Well appearing patient in no apparent distress; mood and affect are within normal limits.  A full examination was performed including scalp, head, eyes, ears, nose, lips, neck, chest, axillae, abdomen, back, buttocks, bilateral upper extremities, bilateral lower extremities, hands, feet, fingers, toes, fingernails, and toenails. All findings within normal limits unless otherwise noted below.  Face Dilated vessels on the face.   L forehead x 1, L zygoma x 1 (2) Erythematous thin papules/macules with gritty scale.   Waistline, groin Erythema of the waistline.  Groin, L axilla. Erythema.   B/L lower legs Erythematous, scaly patches involving the ankle and distal lower leg with associated lower leg edema.    Assessment & Plan  Rosacea Face Rosacea is a chronic progressive skin condition usually affecting the face of adults, causing redness and/or acne bumps. It is treatable but not curable. It sometimes affects the eyes (ocular rosacea) as well. It may respond to topical and/or systemic medication and can flare with stress, sun exposure, alcohol, exercise and some foods.  Daily application of broad spectrum spf 30+ sunscreen to face is  recommended to reduce flares.  Discussed the treatment option of BBL/laser.  Typically we recommend 1-3 treatment sessions about 5-8 weeks apart for best results.  The patient's condition may require "maintenance treatments" in the future.  The fee for BBL / laser treatments is $350 per treatment session for the whole face.  A fee can be quoted for other parts of the body. Insurance typically does not pay for BBL/laser treatments and therefore the fee is an out-of-pocket cost.  Actinic keratosis (2) L forehead x 1, L zygoma x 1 Pigmented  Destruction of lesion - L forehead x 1, L zygoma x 1 Complexity: simple   Destruction method: cryotherapy   Informed consent: discussed and consent obtained   Timeout:  patient name, date of birth, surgical site, and procedure verified Lesion destroyed using liquid nitrogen: Yes   Region frozen until ice ball extended beyond lesion: Yes   Outcome: patient tolerated procedure well with no complications   Post-procedure details: wound care instructions given    Rash -contact dermatitis versus intertrigo of the waistline Waistline, groin Could be from elastic waistband material -   Change waistband from elastic to cotton material. Consider patch testing in the future.  Start Mometasone 0.1% cream apply to aa's QD 4-5 days a week PRN rash. Topical steroids (such as triamcinolone, fluocinolone, fluocinonide, mometasone, clobetasol, halobetasol, betamethasone, hydrocortisone) can cause thinning and lightening of the skin if they are used for too long in the same area. Your physician has selected  the right strength medicine for your problem and area affected on the body. Please use your medication only as directed by your physician to prevent side effects.   mometasone (ELOCON) 0.1 % cream - Waistline, groin Apply to rash on waistline QD-BID PRN.  Erythema intertrigo Groin, L axilla. Intertrigo is a chronic recurrent rash that occurs in skin fold areas that  may be associated with friction; heat; moisture; yeast; fungus; and bacteria.  It is exacerbated by increased movement / activity; sweating; and higher atmospheric temperature.   Start Ketoconazole 2% cream to rash in groin QOD alternating with Mometasone 0.1% cream QOD.   ketoconazole (NIZORAL) 2 % cream - Groin, L axilla. Apply to rash in groin QOD alternating with Mometasone 0.1% cream QOD PRN.  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  Recommend daily graduated compression hose/stockings- easiest to put on first thing in morning, remove at bedtime.  Elevate legs as much as possible. Avoid salt/sodium rich foods.  Lentigines - Scattered tan macules - Due to sun exposure - Benign-appearing, observe - Recommend daily broad spectrum sunscreen SPF 30+ to sun-exposed areas, reapply every 2 hours as needed. - Call for any changes  Seborrheic Keratoses - Stuck-on, waxy, tan-brown papules and/or plaques  - Benign-appearing - Discussed benign etiology and prognosis. - Observe - Call for any changes  Melanocytic Nevi - Tan-brown and/or pink-flesh-colored symmetric macules and papules - Benign appearing on exam today - Observation - Call clinic for new or changing moles - Recommend daily use of broad spectrum spf 30+ sunscreen to sun-exposed areas.   Hemangiomas - Red papules - Discussed benign nature - Observe - Call for any changes  Actinic Damage - Chronic condition, secondary to cumulative UV/sun exposure - diffuse scaly erythematous macules with underlying dyspigmentation - Recommend daily broad spectrum sunscreen SPF 30+ to sun-exposed areas, reapply every 2 hours as needed.  - Staying in the shade or wearing long sleeves, sun glasses (UVA+UVB protection) and wide brim hats (4-inch brim around the  entire circumference of the hat) are also recommended for sun protection.  - Call for new or changing lesions.  Sebaceous Hyperplasia - Small yellow papules with a central dell - Benign - Observe  Dermatofibroma - R calf, L calf  - Firm pink/brown papulenodule with dimple sign - Benign appearing - Call for any changes  Skin cancer screening performed today.  Return in about 3 months (around 03/03/2022) for rash and AK f/u .  Luther Redo, CMA, am acting as scribe for Sarina Ser, MD . Documentation: I have reviewed the above documentation for accuracy and completeness, and I agree with the above.  Sarina Ser, MD

## 2021-12-05 ENCOUNTER — Encounter: Payer: Self-pay | Admitting: Dermatology

## 2021-12-12 ENCOUNTER — Telehealth: Payer: Self-pay | Admitting: Pharmacist

## 2021-12-12 NOTE — Telephone Encounter (Signed)
Received prior auth renewal clinical questions from [facility] for patient's Xolair. Completed form and faxed with most recent OV note. Of note, OV states that once again patient is not using scheduled maintenance inhaler so prior authorization may be denied  Fax: [phone] Phone: [phone] Case: [id number]  Knox Saliva, PharmD, MPH, BCPS Clinical Pharmacist (Rheumatology and Pulmonology)

## 2021-12-13 NOTE — Telephone Encounter (Signed)
Received notification from [facility] regarding a prior authorization for XOLAIR. Authorization has been APPROVED from 12/12/2021 to 12/12/2022. Approval letter sent to scan center.  Authorization # [id number]

## 2021-12-21 NOTE — Telephone Encounter (Signed)
Error!

## 2021-12-25 ENCOUNTER — Encounter: Payer: BLUE CROSS/BLUE SHIELD | Attending: Cardiovascular Disease | Primary: Family Medicine

## 2021-12-27 ENCOUNTER — Encounter: Payer: Self-pay | Admitting: Nurse Practitioner

## 2021-12-31 ENCOUNTER — Other Ambulatory Visit: Payer: Self-pay | Admitting: Nurse Practitioner

## 2022-01-01 NOTE — Telephone Encounter (Signed)
Requested medication (s) are due for refill today: yes ? ?Requested medication (s) are on the active medication list: yes ? ?Last refill:  04/14/21 18g with 5 RF ? ?Future visit scheduled: 03/02/22 ? ?Notes to clinic:  This medication can not be delegated, please assess.  ? ? ? ? ?  ? ?Requested Prescriptions  ?Pending Prescriptions Disp Refills  ? fluticasone (FLONASE) 50 MCG/ACT nasal spray [Pharmacy Med Name: FLUTICASONE PROP 50 MCG SPRAY] 48 mL 1  ?  Sig: SPRAY 2 SPRAYS INTO EACH NOSTRIL EVERY DAY  ?  ? Not Delegated - Ear, Nose, and Throat: Nasal Preparations - Corticosteroids Failed - 12/31/2021  1:16 AM  ?  ?  Failed - This refill cannot be delegated  ?  ?  Passed - Valid encounter within last 12 months  ?  Recent Outpatient Visits   ? ?      ? 3 months ago Morbid obesity ([place])  ? [place], [place] T, NP  ? 4 months ago Morbid obesity ([place])  ? [place], [place] T, NP  ? 8 months ago Morbid obesity ([place])  ? [place], [place] T, NP  ? 9 months ago Aortic atherosclerosis ([place])  ? [facility] [place], [place] T, NP  ? 10 months ago Acute midline low back pain without sciatica  ? [facility], [place] A, NP  ? ?  ?  ?Future Appointments   ? ?        ? In 2 months Ralene Bathe, MD [place]  ? In 2 months [place], Barbaraann Faster, NP [employer]  ? ?  ? ?  ?  ?  ? ? ?

## 2022-01-23 ENCOUNTER — Encounter: Payer: BLUE CROSS/BLUE SHIELD | Primary: Family Medicine

## 2022-01-24 ENCOUNTER — Encounter: Payer: BLUE CROSS/BLUE SHIELD | Primary: Family Medicine

## 2022-01-28 ENCOUNTER — Other Ambulatory Visit: Payer: Self-pay | Admitting: Emergency Medicine

## 2022-02-16 ENCOUNTER — Encounter: Payer: BLUE CROSS/BLUE SHIELD | Attending: Cardiovascular Disease | Primary: Family Medicine

## 2022-03-01 ENCOUNTER — Telehealth: Payer: Self-pay | Admitting: Emergency Medicine

## 2022-03-01 NOTE — Telephone Encounter (Addendum)
Patient states his Medicare will become active on 06/29/22. He is enrolled into Medicare A/B and rx Part D and supplemental Part G. ? ?He will have to transition to Xolair injections at infusion center so therapy plan will be placed closer to end of August so auth can be run by infusion center ? ?Patient has been advised to send in Medicare and supplement insurance card once received. ? ?We reviewed possibility of [employer] PAP for Xolair for him to continue to self-administer but he and his wife exceed income threshold. States he will likely not ever qualify. He states they spend a lot of their income in healthcare. I did review that we have had success in the past with submitting appeal letter with application despite exceeding income threshold. He believes they would still not qualify therefore will move back to running Xolair through medical billing ? ?Knox Saliva, PharmD, MPH, BCPS, CPP ?Clinical Pharmacist (Rheumatology and Pulmonology) ?

## 2022-03-01 NOTE — Telephone Encounter (Signed)
Patient states that when he turns 75 in August and gets Medicare they will not cover at home Xolair injections but they will cover in office Xolair injections. Patient is getting a Xolair shipment on 5/11 that should last him through August. After that, patient will need a new prescription stating his injections are in office.  ? ?Please advise. Call back number is [phone]. ?

## 2022-03-02 ENCOUNTER — Other Ambulatory Visit: Payer: Self-pay | Admitting: Nurse Practitioner

## 2022-03-02 NOTE — Telephone Encounter (Signed)
Requested medication (s) are due for refill today:   Yes for both ? ?Requested medication (s) are on the active medication list:   Yes for both ? ?Future visit scheduled:   Yes in 1 wk with Jolene ? ? ?Last ordered: Pepcid 01/18/2021 #90, 2 refills;   Protonix 03/01/2021 #90, 4 refills ? ?Returned because these are expired.  ? ?Requested Prescriptions  ?Pending Prescriptions Disp Refills  ? famotidine (PEPCID) 40 MG tablet [Pharmacy Med Name: FAMOTIDINE 40 MG TABLET] 90 tablet 2  ?  Sig: TAKE 1 TABLET BY MOUTH EVERY DAY  ?  ? Gastroenterology:  H2 Antagonists Passed - 03/02/2022 12:34 PM  ?  ?  Passed - Valid encounter within last 12 months  ?  Recent Outpatient Visits   ? ?      ? 5 months ago Morbid obesity ([place])  ? [place], Linwood T, NP  ? 6 months ago Morbid obesity ([place])  ? [place], Desert Edge T, NP  ? 10 months ago Morbid obesity ([place])  ? [place], Oblong T, NP  ? 11 months ago Aortic atherosclerosis ([place])  ? [facility] [place], [place] T, NP  ? 12 months ago Acute midline low back pain without sciatica  ? [facility], Lauren A, NP  ? ?  ?  ?Future Appointments   ? ?        ? In 4 days Ralene Bathe, MD [place]  ? In 1 week Cannady, Barbaraann Faster, NP [facility]  ? ?  ? ? ?  ?  ?  ? pantoprazole (PROTONIX) 40 MG tablet [Pharmacy Med Name: PANTOPRAZOLE SOD DR 40 MG TAB] 90 tablet 4  ?  Sig: TAKE 1 TABLET BY MOUTH EVERY DAY  ?  ? Gastroenterology: Proton Pump Inhibitors Passed - 03/02/2022 12:34 PM  ?  ?  Passed - Valid encounter within last 12 months  ?  Recent Outpatient Visits   ? ?      ? 5 months ago Morbid obesity ([place])  ? [place], [place] T, NP  ? 6 months ago Morbid obesity ([place])  ? [place], Lindsey T, NP  ? 10 months ago Morbid obesity ([place])  ? [place], [place] T, NP  ? 11 months ago Aortic atherosclerosis  ([place])  ? [facility], Niles T, NP  ? 12 months ago Acute midline low back pain without sciatica  ? [facility], Lauren A, NP  ? ?  ?  ?Future Appointments   ? ?        ? In 4 days Ralene Bathe, MD [place]  ? In 1 week Cannady, Barbaraann Faster, NP [facility]  ? ?  ? ? ?  ?  ?  ? ?

## 2022-03-02 NOTE — Telephone Encounter (Signed)
Requested Prescriptions  ?Pending Prescriptions Disp Refills  ?? albuterol (VENTOLIN HFA) 108 (90 Base) MCG/ACT inhaler [Pharmacy Med Name: ALBUTEROL HFA (PROAIR) INHALER] 18 g 1  ?  Sig: INHALE 1 PUFF INTO THE LUNGS EVERY 6 HOURS AS NEEDED.  ?  ? Pulmonology:  Beta Agonists 2 Failed - 03/02/2022 10:15 AM  ?  ?  Failed - Last BP in normal range  ?  BP Readings from Last 1 Encounters:  ?11/01/21 (!) 148/76  ?   ?  ?  Passed - Last Heart Rate in normal range  ?  Pulse Readings from Last 1 Encounters:  ?11/01/21 81  ?   ?  ?  Passed - Valid encounter within last 12 months  ?  Recent Outpatient Visits   ?      ? 5 months ago Morbid obesity ([place])  ? [place], [place] T, NP  ? 6 months ago Morbid obesity ([place])  ? [place], Chadbourn T, NP  ? 10 months ago Morbid obesity ([place])  ? [place], [place] T, NP  ? 11 months ago Aortic atherosclerosis ([place])  ? [facility], [place] T, NP  ? 12 months ago Acute midline low back pain without sciatica  ? [facility], Scheryl Darter, NP  ?  ?  ?Future Appointments   ?        ? In 4 days Ralene Bathe, MD [place]  ? In 1 week Cannady, Barbaraann Faster, NP [facility]  ?  ? ?  ?  ?  ? ? ?

## 2022-03-06 ENCOUNTER — Ambulatory Visit: Payer: No Typology Code available for payment source | Admitting: Dermatology

## 2022-03-09 ENCOUNTER — Encounter: Payer: Self-pay | Admitting: Nurse Practitioner

## 2022-03-10 NOTE — Patient Instructions (Signed)

## 2022-03-12 ENCOUNTER — Ambulatory Visit (INDEPENDENT_AMBULATORY_CARE_PROVIDER_SITE_OTHER): Payer: No Typology Code available for payment source | Admitting: Nurse Practitioner

## 2022-03-12 ENCOUNTER — Encounter: Payer: Self-pay | Admitting: Nurse Practitioner

## 2022-03-12 DIAGNOSIS — J3081 Allergic rhinitis due to animal (cat) (dog) hair and dander: Secondary | ICD-10-CM

## 2022-03-12 DIAGNOSIS — R7309 Other abnormal glucose: Secondary | ICD-10-CM

## 2022-03-12 DIAGNOSIS — E559 Vitamin D deficiency, unspecified: Secondary | ICD-10-CM

## 2022-03-12 DIAGNOSIS — I700 Atherosclerosis of aorta: Secondary | ICD-10-CM

## 2022-03-12 DIAGNOSIS — G4733 Obstructive sleep apnea (adult) (pediatric): Secondary | ICD-10-CM

## 2022-03-12 DIAGNOSIS — J4520 Mild intermittent asthma, uncomplicated: Secondary | ICD-10-CM | POA: Diagnosis not present

## 2022-03-12 DIAGNOSIS — K219 Gastro-esophageal reflux disease without esophagitis: Secondary | ICD-10-CM

## 2022-03-12 DIAGNOSIS — I10 Essential (primary) hypertension: Secondary | ICD-10-CM | POA: Diagnosis not present

## 2022-03-12 DIAGNOSIS — Z0000 Encounter for general adult medical examination without abnormal findings: Secondary | ICD-10-CM | POA: Diagnosis not present

## 2022-03-12 DIAGNOSIS — N400 Enlarged prostate without lower urinary tract symptoms: Secondary | ICD-10-CM

## 2022-03-12 NOTE — Assessment & Plan Note (Signed)
Noted on past labs, is diet focused with 10 pounds lost.  Recheck A1c today. ?

## 2022-03-12 NOTE — Assessment & Plan Note (Signed)
Chronic, ongoing.  Continue current medication regimen and adjust as needed.  Avoid elements that cause reactions, such as animals. 
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
## 2022-03-12 NOTE — Assessment & Plan Note (Signed)
Chronic, stable with good compliance to CPAP.  Continue to utilize daily. 

## 2022-03-12 NOTE — Assessment & Plan Note (Signed)
Chronic, stable.  No recent exacerbations.  Followed by pulmonary and allergist. 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  Continue current medication regimen and adjust as needed, minimal use at this time of Albuterol.  Continue collaboration with pulmonary and review notes.  Is on ACE, if cough or refractory symptoms present then will determine alternate option for BP, such as Amlodipine or Losartan.  CBC today.  Return in 6 months.   ?

## 2022-03-12 NOTE — Assessment & Plan Note (Signed)
Chronic, stable.  Continue current medication regimen and adjust as needed.  Consider referral to GI if worsening symptoms.  Reports history of multiple EGD.  Is on Pepcid and PPI, this is long standing and also benefits his asthma, as in past reports GERD symptoms exacerbated asthma symptoms.  Mag level today.  Return in 6 months. ?

## 2022-03-12 NOTE — Assessment & Plan Note (Signed)
Chronic, stable with BP at goal in office today.  Recommend he monitor BP at least a few mornings a week at home and document.  DASH diet at home.  Continue current medication regimen and adjust as needed.  Labs today: CMP, TSH, lipid, CBC.  Return in 6 months. ? ?

## 2022-03-12 NOTE — Assessment & Plan Note (Signed)
BMI 43.96 with 10 pounds lost.  Recommended eating smaller high protein, low fat meals more frequently and exercising 30 mins a day 5 times a week with a goal of 10-15lb weight loss in the next 3 months. Patient voiced their understanding and motivation to adhere to these recommendations. ? ?

## 2022-03-13 LAB — COMPREHENSIVE METABOLIC PANEL
ALT: 17 IU/L (ref 0–44)
AST: 18 IU/L (ref 0–40)
Albumin/Globulin Ratio: 2.5 — ABNORMAL HIGH (ref 1.2–2.2)
Albumin: 4.7 g/dL (ref 3.8–4.8)
Alkaline Phosphatase: 69 IU/L (ref 44–121)
BUN/Creatinine Ratio: 30 — ABNORMAL HIGH (ref 10–24)
BUN: 33 mg/dL — ABNORMAL HIGH (ref 8–27)
Bilirubin Total: 0.3 mg/dL (ref 0.0–1.2)
CO2: 21 mmol/L (ref 20–29)
Calcium: 9.6 mg/dL (ref 8.6–10.2)
Chloride: 103 mmol/L (ref 96–106)
Creatinine, Ser: 1.1 mg/dL (ref 0.76–1.27)
Globulin, Total: 1.9 g/dL (ref 1.5–4.5)
Glucose: 112 mg/dL — ABNORMAL HIGH (ref 70–99)
Potassium: 4.3 mmol/L (ref 3.5–5.2)
Sodium: 141 mmol/L (ref 134–144)
Total Protein: 6.6 g/dL (ref 6.0–8.5)
eGFR: 75 mL/min/{1.73_m2} (ref >59)

## 2022-03-13 LAB — LIPID PANEL W/O CHOL/HDL RATIO
Cholesterol, Total: 191 mg/dL (ref 100–199)
HDL: 58 mg/dL (ref >39)
LDL Chol Calc (NIH): 118 mg/dL — ABNORMAL HIGH (ref 0–99)
Triglycerides: 82 mg/dL (ref 0–149)
VLDL Cholesterol Cal: 15 mg/dL (ref 5–40)

## 2022-03-13 LAB — HEMOGLOBIN A1C
Est. average glucose Bld gHb Est-mCnc: 111 mg/dL
Hgb A1c MFr Bld: 5.5 % (ref 4.8–5.6)

## 2022-03-13 LAB — VITAMIN D 25 HYDROXY (VIT D DEFICIENCY, FRACTURES): Vit D, 25-Hydroxy: 53.1 ng/mL (ref 30.0–100.0)

## 2022-03-13 LAB — CBC WITH DIFFERENTIAL/PLATELET
Basophils Absolute: 0.1 10*3/uL (ref 0.0–0.2)
Basos: 1 %
EOS (ABSOLUTE): 0.2 10*3/uL (ref 0.0–0.4)
Eos: 3 %
Hematocrit: 45.9 % (ref 37.5–51.0)
Hemoglobin: 15.5 g/dL (ref 13.0–17.7)
Immature Grans (Abs): 0 10*3/uL (ref 0.0–0.1)
Immature Granulocytes: 0 %
Lymphocytes Absolute: 1.2 10*3/uL (ref 0.7–3.1)
Lymphs: 21 %
MCH: 30.2 pg (ref 26.6–33.0)
MCHC: 33.8 g/dL (ref 31.5–35.7)
MCV: 90 fL (ref 79–97)
Monocytes Absolute: 0.6 10*3/uL (ref 0.1–0.9)
Monocytes: 10 %
Neutrophils Absolute: 3.7 10*3/uL (ref 1.4–7.0)
Neutrophils: 65 %
Platelets: 229 10*3/uL (ref 150–450)
RBC: 5.13 x10E6/uL (ref 4.14–5.80)
RDW: 12.6 % (ref 11.6–15.4)
WBC: 5.8 10*3/uL (ref 3.4–10.8)

## 2022-03-13 LAB — PSA: Prostate Specific Ag, Serum: 1.5 ng/mL (ref 0.0–4.0)

## 2022-03-13 LAB — MAGNESIUM: Magnesium: 2.3 mg/dL (ref 1.6–2.3)

## 2022-03-13 LAB — TSH: TSH: 0.961 u[IU]/mL (ref 0.450–4.500)

## 2022-03-13 NOTE — Progress Notes (Signed)
Contacted via MyChart ?The 10-year ASCVD risk score (Arnett DK, et al., 2019) is: 9.1% ?  Values used to calculate the score: ?    Age: 65 years ?    Sex: Male ?    Is Non-Hispanic African American: No ?    Diabetic: No ?    Tobacco smoker: No ?    Systolic Blood Pressure: 108 mmHg ?    Is BP treated: Yes ?    HDL Cholesterol: 58 mg/dL ?    Total Cholesterol: 191 mg/dL ? ? ?Good evening Jose Liu, your labs have returned: ?- Kidney function, creatinine and eGFR, remains normal, as is liver function, AST and ALT.   ?- Cholesterol labs continue to show elevation in LDL, at this time though we can continue to focus on diet changes for this and discuss more next visit:) ?- CBC shows no anemia or infection.  TSH, thyroid lab, is normal. ?- Remainder of labs are all stable, including A1c which has trended down to 5.5% and out of prediabetic range.  Any questions? ?Keep being inspiring!!  Thank you for allowing me to participate in your care.  I appreciate you. ?Kindest regards, ?Jolene ?

## 2022-03-18 ENCOUNTER — Encounter: Payer: Self-pay | Admitting: Nurse Practitioner

## 2022-03-24 ENCOUNTER — Other Ambulatory Visit: Payer: Self-pay | Admitting: Nurse Practitioner

## 2022-03-27 ENCOUNTER — Encounter: Payer: Self-pay | Admitting: Nurse Practitioner

## 2022-03-27 NOTE — Telephone Encounter (Signed)
Rx 03/02/22 18g- 200 inhalation- too soon Requested Prescriptions  Pending Prescriptions Disp Refills  . albuterol (VENTOLIN HFA) 108 (90 Base) MCG/ACT inhaler [Pharmacy Med Name: ALBUTEROL HFA (PROAIR) INHALER] 8.5 each     Sig: INHALE 1 PUFF INTO THE LUNGS EVERY 6 HOURS AS NEEDED.     Pulmonology:  Beta Agonists 2 Passed - 03/24/2022  1:24 AM      Passed - Last BP in normal range    BP Readings from Last 1 Encounters:  03/12/22 108/66         Passed - Last Heart Rate in normal range    Pulse Readings from Last 1 Encounters:  03/12/22 71         Passed - Valid encounter within last 12 months    Recent Outpatient Visits          2 weeks ago Morbid obesity ([place])   [place], [place] T, NP   6 months ago Morbid obesity ([place])   [place] Cannady, [place] T, NP   7 months ago Morbid obesity ([place])   [place] Cannady, Henrine Screws T, NP   10 months ago Morbid obesity ([place])   [place], Henrine Screws T, NP   1 year ago Aortic atherosclerosis ([place])   [place], Barbaraann Faster, NP      Future Appointments            In 5 months Cannady, Barbaraann Faster, NP [facility]

## 2022-03-28 MED ORDER — ALBUTEROL SULFATE HFA 108 (90 BASE) MCG/ACT IN AERS: INHALATION_SPRAY | RESPIRATORY_TRACT | 5 refills | Status: DC

## 2022-04-03 ENCOUNTER — Encounter: Payer: BLUE CROSS/BLUE SHIELD | Primary: Family Medicine

## 2022-04-04 ENCOUNTER — Encounter: Payer: BLUE CROSS/BLUE SHIELD | Primary: Family Medicine

## 2022-04-09 ENCOUNTER — Encounter: Payer: Self-pay | Admitting: Nurse Practitioner

## 2022-04-09 ENCOUNTER — Encounter: Payer: Self-pay | Admitting: Emergency Medicine

## 2022-04-10 ENCOUNTER — Ambulatory Visit: Payer: No Typology Code available for payment source | Admitting: Surgical

## 2022-04-10 NOTE — Telephone Encounter (Signed)
Called patient to let him know that it is up to him to call the insurance company or speak with the agent to inquire if we are in network. We file all insurances but it is up to the patient to find out if we are in network with their company. LVM asking patient to call back. Please let him know this.

## 2022-04-11 ENCOUNTER — Ambulatory Visit: Payer: No Typology Code available for payment source | Admitting: Surgical

## 2022-04-12 ENCOUNTER — Encounter: Payer: Self-pay | Admitting: Nurse Practitioner

## 2022-04-13 ENCOUNTER — Telehealth: Payer: Self-pay | Admitting: Pharmacist

## 2022-04-13 NOTE — Telephone Encounter (Signed)
Patient transitioning to Medicare on 05/29/22. Please see MyChart encounter from 04/09/22. Patient will likely not qualify for [place] patient assistance due to income but may be worth revisiting conversation with patients once Medicare is active.  Patient aware that he will likely have to come to infusion center for admin of Xolair  Knox Saliva, PharmD, MPH, BCPS, CPP Clinical Pharmacist (Rheumatology and Pulmonology)

## 2022-04-18 ENCOUNTER — Encounter: Payer: Self-pay | Admitting: Nurse Practitioner

## 2022-04-25 ENCOUNTER — Encounter: Payer: Self-pay | Admitting: Orthopedic Surgery

## 2022-04-25 ENCOUNTER — Ambulatory Visit: Payer: No Typology Code available for payment source | Admitting: Orthopedic Surgery

## 2022-04-25 DIAGNOSIS — M170 Bilateral primary osteoarthritis of knee: Secondary | ICD-10-CM
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
## 2022-04-29 ENCOUNTER — Encounter: Payer: Self-pay | Admitting: Nurse Practitioner

## 2022-05-02 ENCOUNTER — Telehealth: Payer: Self-pay | Admitting: Pharmacist

## 2022-05-02 NOTE — Telephone Encounter (Signed)
PT contacted front desk via mychart. Initially he was requesting appointment with Dr. Lamonte Sakai for his xolair injection. I read through all of the previous messages between pharmacy and pt, and it did seem like it was recommened to f/u with RB. However when appt was offered, pt was unsure if he actually needed to see RB to get his injection. Devki, can you clarify if pt needs an OV with Dr Lamonte Sakai for xolair injection? Or does he need to see you to get started on it?

## 2022-05-03 ENCOUNTER — Encounter: Payer: BLUE CROSS/BLUE SHIELD | Attending: Cardiovascular Disease | Primary: Family Medicine

## 2022-05-03 NOTE — Telephone Encounter (Signed)
Patient is already on Xolair. He will be transitioning to infusion center for injections due to lack of affordability through [employer]. He does not qualify for patient assistance either due to exceeding income criteria for program. He can make an appointment if needed but does not need an appointment just to discuss changing insurance as pharmacy team is aware of this change  Knox Saliva, PharmD, MPH, BCPS, CPP Clinical Pharmacist (Rheumatology and Pulmonology)

## 2022-05-11 ENCOUNTER — Ambulatory Visit: Payer: No Typology Code available for payment source | Admitting: Emergency Medicine

## 2022-05-14 ENCOUNTER — Other Ambulatory Visit: Payer: Self-pay | Admitting: Nurse Practitioner

## 2022-05-14 IMAGING — US US EXTREM LOW VENOUS*L*
1 series · 14 of 24 positions shown · non-contrast
Comparison: None.

CLINICAL DATA: Mass/lump left calf.

EXAM:
Left LOWER EXTREMITY VENOUS DOPPLER ULTRASOUND
TECHNIQUE: Gray-scale sonography with compression, as well as color and duplex
ultrasound, were performed to evaluate the deep venous system(s)
from the level of the common femoral vein through the popliteal and
proximal calf veins.

[Series 1: us venous img lower uni left (dvt) · portal-venous · 14 of 38 slices shown]
[im 1/38]
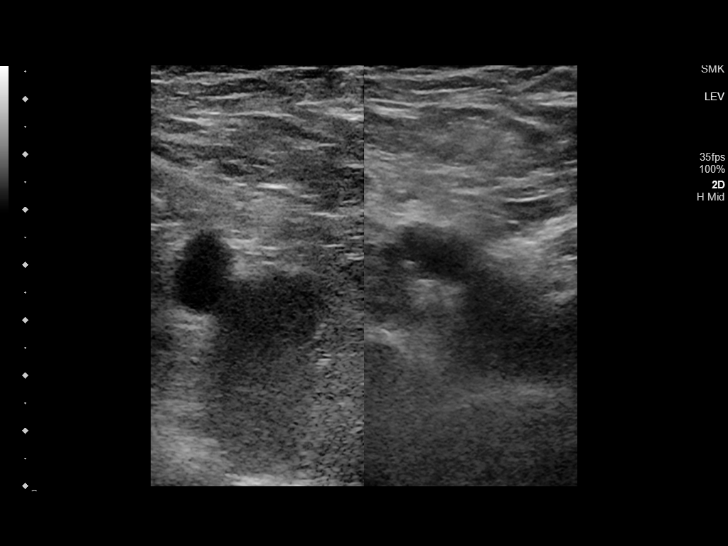
[im 4/38]
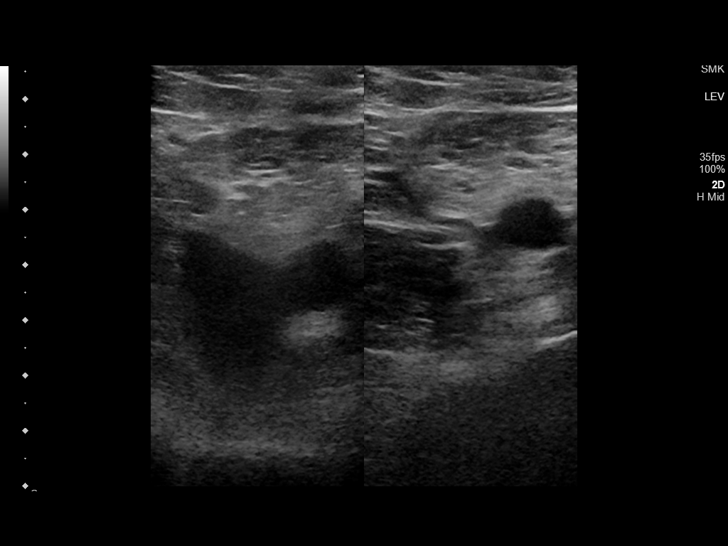
[im 7/38]
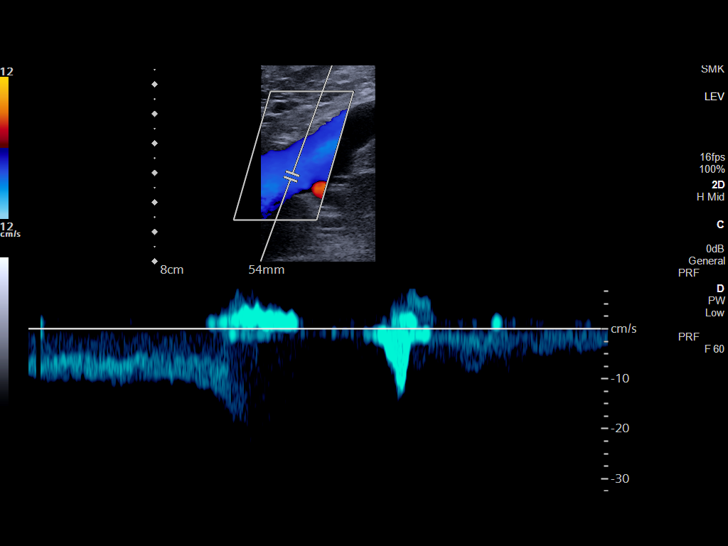
[im 10/38]
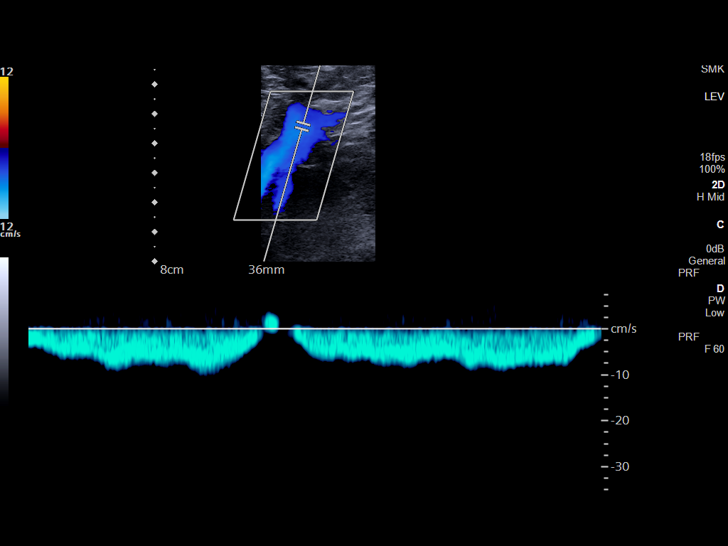
[im 12/38]
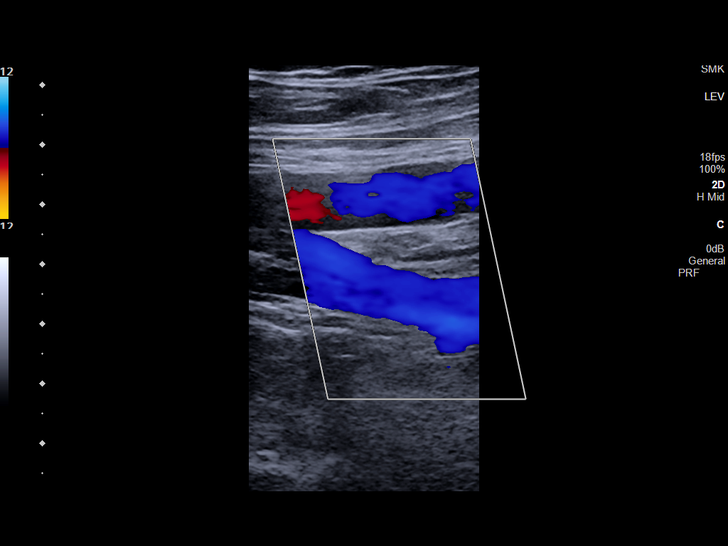
[im 15/38]
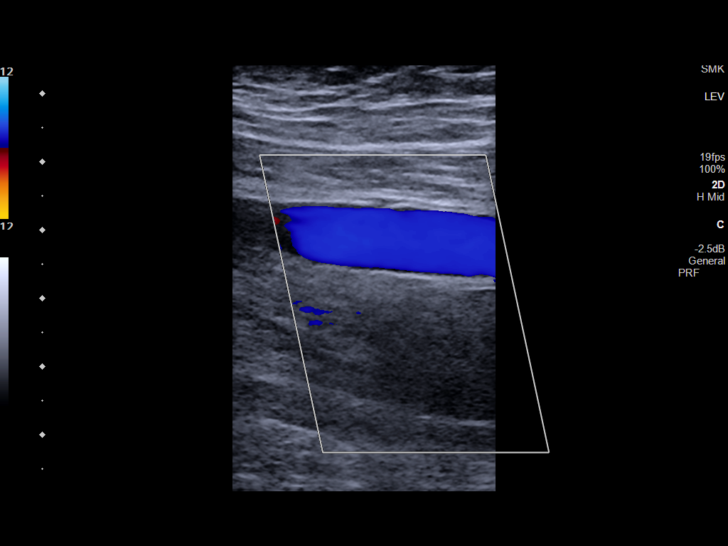
[im 18/38]
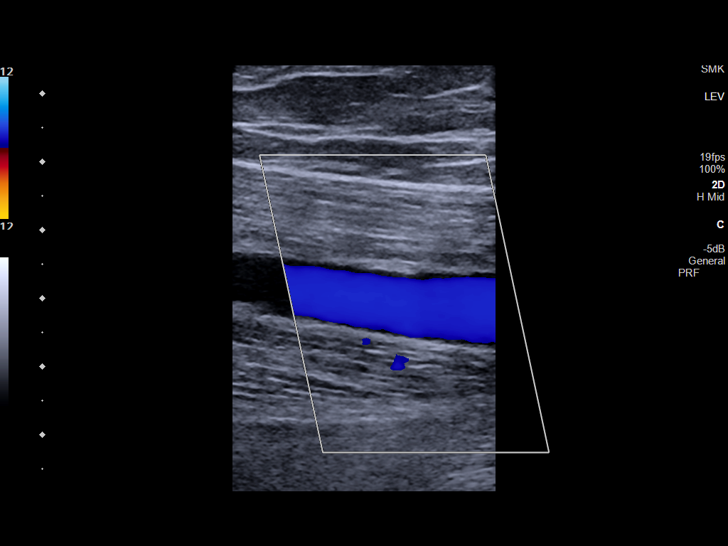
[im 20/38]
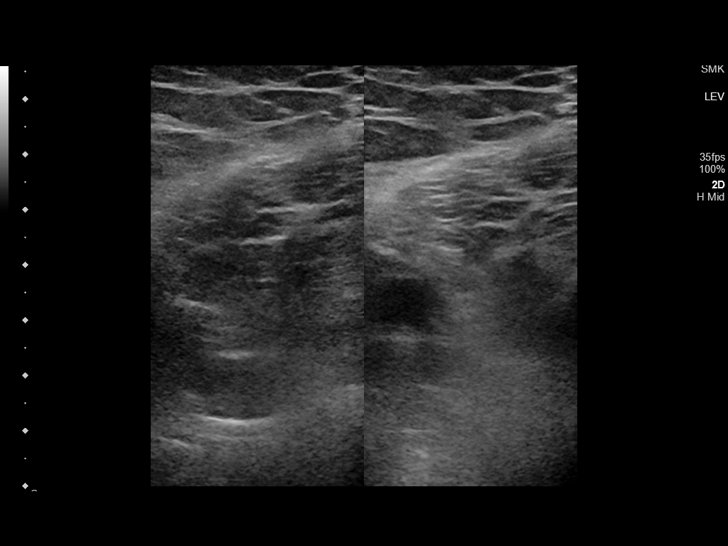
[im 23/38]
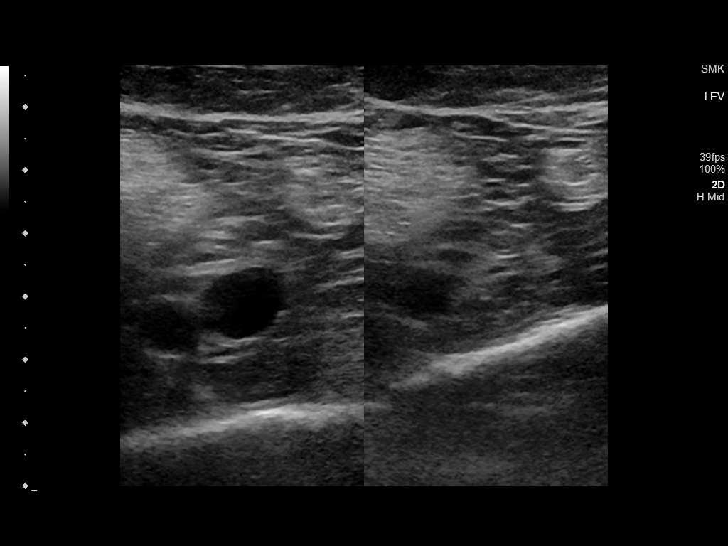
[im 26/38]
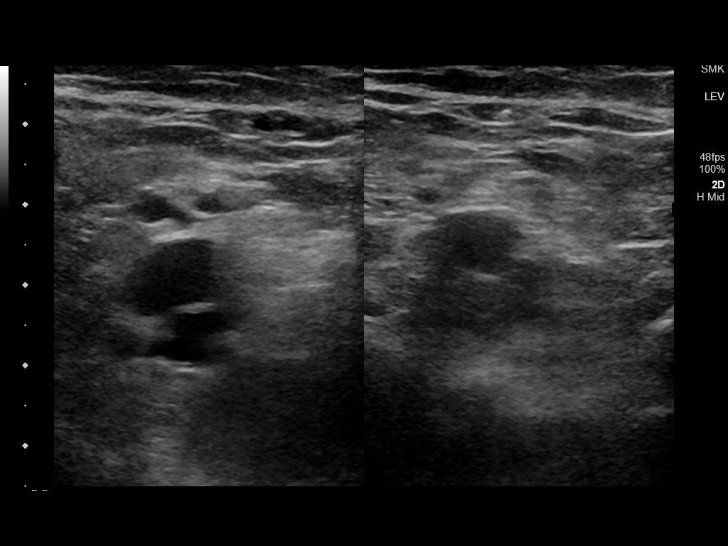
[im 29/38]
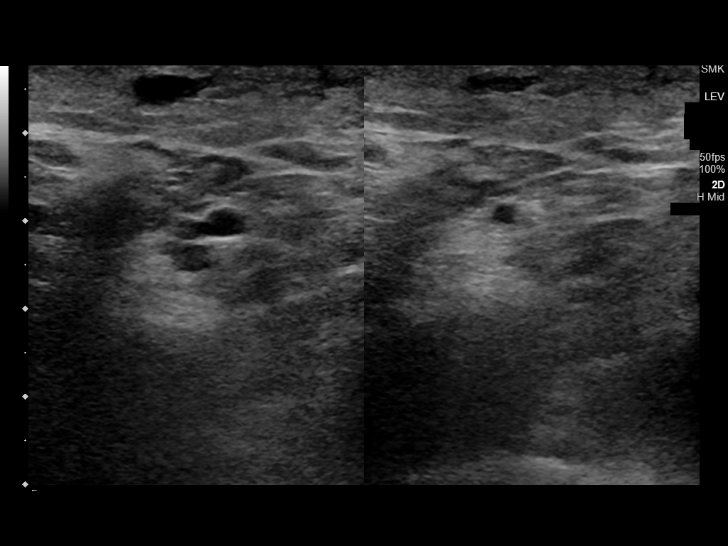
[im 31/38]
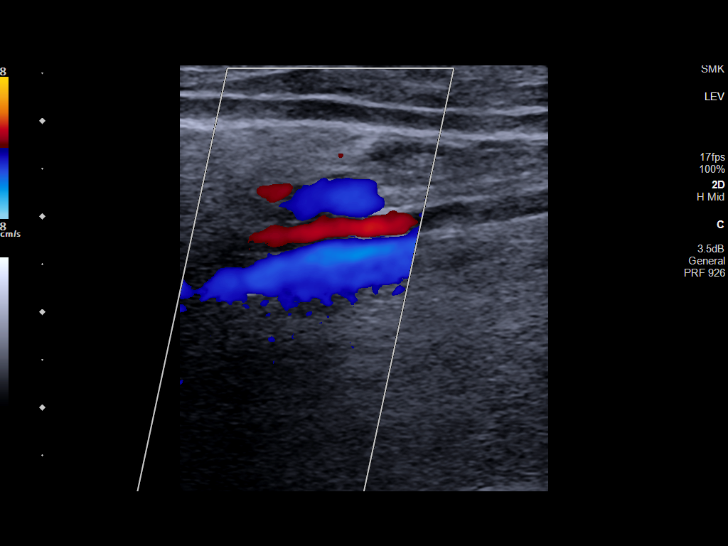
[im 34/38]
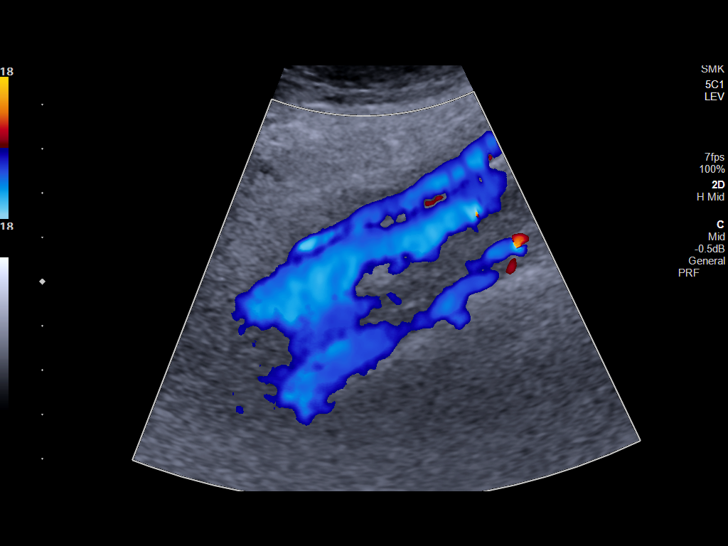
[im 38/38]
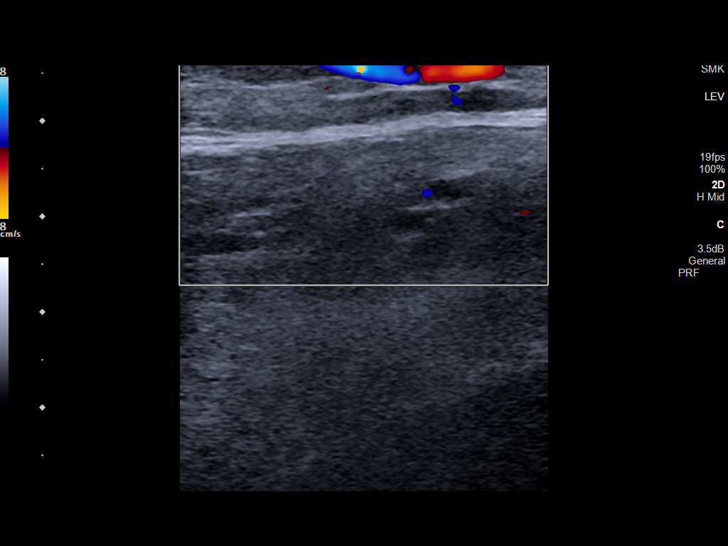

[14 of 24 positions shown; findings below may reference images not displayed]

FINDINGS: VENOUS

Normal compressibility of the common femoral, superficial femoral,
and popliteal veins, as well as the visualized calf veins.
Visualized portions of profunda femoral vein and great saphenous
vein unremarkable. No filling defects to suggest DVT on grayscale or
color Doppler imaging. Doppler waveforms show normal direction of
venous flow, normal respiratory plasticity and response to
augmentation.

Limited views of the contralateral common femoral vein are
unremarkable.

OTHER

None.

Limitations: none
IMPRESSION: Negative.

## 2022-05-15 NOTE — Telephone Encounter (Signed)
Requested Prescriptions  Pending Prescriptions Disp Refills  . montelukast (SINGULAIR) 10 MG tablet [Pharmacy Med Name: MONTELUKAST SOD 10 MG TABLET] 90 tablet 1    Sig: TAKE 1 TABLET BY MOUTH EVERYDAY AT BEDTIME     Pulmonology:  Leukotriene Inhibitors Passed - 05/14/2022  2:47 PM      Passed - Valid encounter within last 12 months    Recent Outpatient Visits          2 months ago Morbid obesity ([place])   [place] Cannady, Jolene T, NP   8 months ago Morbid obesity ([place])   [place] Cannady, Jolene T, NP   9 months ago Morbid obesity ([place])   [place], Henrine Screws T, NP   1 year ago Morbid obesity ([place])   [place], Henrine Screws T, NP   1 year ago Aortic atherosclerosis ([place])   [place], Barbaraann Faster, NP      Future Appointments            In 4 months Cannady, Barbaraann Faster, NP [employer]

## 2022-05-29 ENCOUNTER — Ambulatory Visit: Admit: 2022-05-29 | Discharge: 2022-07-16 | Payer: BLUE CROSS/BLUE SHIELD | Primary: Family Medicine

## 2022-05-29 DIAGNOSIS — R0609 Other forms of dyspnea: Secondary | ICD-10-CM

## 2022-05-29 MED ORDER — TECHNETIUM TC 99M SESTAMIBI IV KIT
Freq: Once | INTRAVENOUS | Status: AC | PRN
Start: 2022-05-29 — End: 2022-05-29
  Administered 2022-05-29: 17:00:00 36.2 via INTRAVENOUS

## 2022-05-30 ENCOUNTER — Encounter: Admit: 2022-05-30 | Payer: BLUE CROSS/BLUE SHIELD | Primary: Family Medicine

## 2022-05-30 MED ORDER — TECHNETIUM TC 99M SESTAMIBI IV KIT
Freq: Once | INTRAVENOUS | Status: AC | PRN
Start: 2022-05-30 — End: 2022-05-30
  Administered 2022-05-30: 13:00:00 34.9 via INTRAVENOUS

## 2022-05-31 LAB — NM STRESS TEST WITH MYOCARDIAL PERFUSION
Baseline Diastolic BP: 80 mmHg
Baseline HR: 75 {beats}/min
Baseline Systolic BP: 140 mmHg
Body Surface Area: 2.42 m2
Exercise Duration Time: 8 min
Exercuse Duration Seconds: 1 s
Nuc Stress EF: 71 %
Stress Diastolic BP: 90 mmHg
Stress Estimated Workload: 10.1 METS
Stress Peak HR: 148 {beats}/min
Stress Percent HR Achieved: 95 %
Stress Rate Pressure Product: 24568 bpm*mmHg
Stress Systolic BP: 166 mmHg
Stress Target HR: 156 {beats}/min

## 2022-06-11 NOTE — Telephone Encounter (Signed)
From: Barton Dubois  To: Dr. Arnoldo Morale  Sent: 06/10/2022 6:38 PM EDT  Subject: I recieved test results, with blockage. Also Insurance notice of another Test.     Any expertise regarding interpretation of the results would greatly be appreciated.    - My wife was just given a biopsy result last Friday with bad news.     So, I really would appreciate knowing opinions of Heart results. She is concerned about me (I don't want her to be just know the opinions of experts).    Thank you greatly,  Steven [name]

## 2022-06-11 NOTE — Telephone Encounter (Signed)
Nuclear stress test demonstrates normal left ventricular systolic function and no evidence of blockages.

## 2022-06-11 NOTE — Telephone Encounter (Signed)
Patient messaged about test results on my chart

## 2022-06-11 NOTE — Telephone Encounter (Signed)
-----   Message from Genella Rife,  sent at 06/11/2022  7:57 AM EDT -----  Regarding: FW: I recieved test results, with blockage. Also Insurance notice of another Test.   Contact: [phone]    ----- Message -----  From: Barton Dubois  Sent: 06/10/2022   6:38 PM EDT  To: [facility] Clinical [facility]  Subject: I recieved test results, with blockage. Also#    Any expertise regarding interpretation of the results would greatly be appreciated.    - My wife was just given a biopsy result last Friday with bad news.     So, I really would appreciate knowing opinions of Heart results. She is concerned about me (I don't want her to be just know the opinions of experts).    Thank you greatly,  [name]

## 2022-06-11 NOTE — Telephone Encounter (Signed)
Nuclear stress test results in connect care

## 2022-06-22 ENCOUNTER — Encounter: Payer: Self-pay | Admitting: Orthopedic Surgery

## 2022-06-22 NOTE — Telephone Encounter (Signed)
Pls rf to [facility] thx

## 2022-06-29 ENCOUNTER — Other Ambulatory Visit (HOSPITAL_COMMUNITY): Payer: Self-pay

## 2022-06-29 ENCOUNTER — Telehealth: Payer: Self-pay | Admitting: Pharmacist

## 2022-06-29 DIAGNOSIS — J4540 Moderate persistent asthma, uncomplicated: Secondary | ICD-10-CM

## 2022-06-29 NOTE — Telephone Encounter (Signed)
Patient's AARP now active as of 06/29/22. Submitted a Prior Authorization request to [facility] for XOLAIR via CoverMyMeds. Will update once we receive a response.  Key: [id number]  Once approved, if copay is high, patient has previously confirmed he would not qualify for [facility] pt assistance program. He will likely have to transition back to injections to be received at infusion center and referral will have to be placed.  Knox Saliva, PharmD, MPH, BCPS, CPP Clinical Pharmacist (Rheumatology and Pulmonology)

## 2022-06-29 NOTE — Telephone Encounter (Signed)
Received notification from [facility] regarding a prior authorization for XOLAIR. Authorization has been APPROVED from 06/29/22 to 12/28/22.   Authorization #  [id number]  Unable to run test claim (has not adjudicated thru system). Pharmacy team will have to try again  Knox Saliva, PharmD, MPH, BCPS, CPP Clinical Pharmacist (Rheumatology and Pulmonology)

## 2022-07-05 ENCOUNTER — Encounter: Payer: Self-pay | Admitting: Neurology

## 2022-07-05 ENCOUNTER — Encounter: Payer: Self-pay | Admitting: Dermatology

## 2022-07-05 ENCOUNTER — Encounter: Payer: Self-pay | Admitting: Nurse Practitioner

## 2022-07-05 ENCOUNTER — Encounter: Payer: Self-pay | Admitting: Orthopedic Surgery

## 2022-07-05 ENCOUNTER — Encounter: Payer: Self-pay | Admitting: Emergency Medicine

## 2022-07-05 DIAGNOSIS — M1990 Unspecified osteoarthritis, unspecified site: Secondary | ICD-10-CM

## 2022-07-06 ENCOUNTER — Other Ambulatory Visit (HOSPITAL_COMMUNITY): Payer: Self-pay

## 2022-07-06 MED ORDER — FLUTICASONE PROPIONATE 50 MCG/ACT NA SUSP: NASAL | 6 refills | Status: DC

## 2022-07-06 MED ORDER — FAMOTIDINE 40 MG PO TABS
40.0000 mg | ORAL_TABLET | Freq: Every day | ORAL | 4 refills | Status: AC
Start: 2022-07-06 — End: ?

## 2022-07-06 MED ORDER — LISINOPRIL-HYDROCHLOROTHIAZIDE 10-12.5 MG PO TABS: 1.0000 | ORAL_TABLET | Freq: Every day | ORAL | 4 refills | Status: DC

## 2022-07-06 MED ORDER — PANTOPRAZOLE SODIUM 40 MG PO TBEC
40.0000 mg | DELAYED_RELEASE_TABLET | Freq: Every day | ORAL | 4 refills | Status: AC
Start: 2022-07-06 — End: ?

## 2022-07-06 MED ORDER — GABAPENTIN 300 MG PO CAPS: 600.0000 mg | ORAL_CAPSULE | Freq: Three times a day (TID) | ORAL | 11 refills | Status: DC

## 2022-07-06 MED ORDER — MONTELUKAST SODIUM 10 MG PO TABS: ORAL_TABLET | ORAL | 4 refills | Status: DC

## 2022-07-06 NOTE — Telephone Encounter (Signed)
Still unable to run test claim for Xolair through [employer]. Rejection states that [facility] plan is primary. However [employer] plan is also rejecting stating billing information is incorrect.  MyChart message sent to patient to determine when [employer] will become inactive  Knox Saliva, PharmD, MPH, BCPS, CPP Clinical Pharmacist (Rheumatology and Pulmonology)

## 2022-07-20 ENCOUNTER — Encounter
Admit: 2022-07-20 | Discharge: 2022-07-20 | Payer: BLUE CROSS/BLUE SHIELD | Attending: Cardiovascular Disease | Primary: Family Medicine

## 2022-07-20 DIAGNOSIS — I10 Essential (primary) hypertension: Secondary | ICD-10-CM

## 2022-07-20 NOTE — Progress Notes (Signed)
[facility]  [address], [address]  [place], [place] [address]      07/20/22      NAME:  Steven [name]  DOB: 1956-10-19  MRN: [id number]      SUBJECTIVE:   Steven [name] is a 66 y.o. male seen for a follow up visit regarding the following:     Chief Complaint   Patient presents with    Hypertension    Hyperlipidemia       HPI:   66 y.o. history of hypertension dyslipidemia and morbid obesity.  He was seen by me in 2013 with CT coronary angiogram demonstrating calcium score of 0 and normal coronaries.  He has not been seen in approximately 10 years.  He presents now with some increasing episodes of dyspnea on exertion fatigue and weakness.  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  He has a strong family history of coronary disease and presents for work-up due to worsening dyspnea.  He reports recent weight loss of almost 30 pounds.  He reports compliance with CPAP for obstructive sleep apnea.  Stress MPI 05/2022 was normal  and normal echo.            Past Medical History, Past Surgical History, Family history, Social History, and Medications were all reviewed with the patient today and updated as necessary.     Current Outpatient Medications   Medication Sig Dispense Refill    OMEPRAZOLE PO Take 20 mg by mouth      aspirin 81 MG chewable tablet Take 1 tablet by mouth      atorvastatin (LIPITOR) 40 MG tablet Take by mouth      DULoxetine HCl 60 MG CSDR Take 60 mg by mouth daily      melatonin 3 MG TABS tablet Take by mouth      metFORMIN (GLUCOPHAGE) 1000 MG tablet TAKE ONE TABLET BY MOUTH TWICE A DAY      olmesartan-hydroCHLOROthiazide (BENICAR HCT) 40-12.5 MG per tablet Take 0.5 tablets by mouth daily       No current facility-administered medications for this visit.     Patient Active Problem List    Diagnosis Date Noted    Dyspnea on exertion 08/17/2021     Priority: High    Neuropathy 08/28/2019     Priority:  Low    Pre-diabetes      Priority: Low     11/2016, 06/16/15 Annual Dilated Eye Exam shows no diabetic retinopathy        Tear of lateral meniscus of right knee 10/14/2016     Priority: Low    Osteoarthritis of right knee 10/14/2016     Priority: Low    Complete tear of right ACL 10/14/2016     Priority: Low    Morbid obesity (HCC)      Priority: Low     bmi = 40        Hypercholesterolemia      Priority: Low    GERD (gastroesophageal reflux disease)      Priority: Low     occ no meds        Spinal stenosis of lumbar region without neurogenic claudication 11/06/2015     Priority: Low    Essential hypertension, benign      Priority: Low    Spondylolisthesis at L4-L5 level 10/19/2015     Priority: Low    OSA (obstructive sleep apnea) 06/30/2015  Priority: Low    Testicular hypofunction      Priority: Low    Chronic radicular low back pain 09/11/2014     Priority: Low    Chronic left sacroiliac joint pain 08/23/2014     Priority: Low    Mood disorder (HCC) 07/14/2014     Priority: Low    Venous insufficiency of both lower extremities 07/14/2014     Priority: Low    Generalized anxiety disorder      Priority: Low      Family History   Problem Relation Age of Onset    Diabetes Sister     Alzheimer's Disease Mother     Heart Disease Father         atherosclerosis (heavy smoker)    Diabetes Mother      Social History     Tobacco Use    Smoking status: Never    Smokeless tobacco: Never   Substance Use Topics    Alcohol use: Yes     Alcohol/week: 1.0 standard drink of alcohol       Review Of Symptoms    Review of Systems   Constitutional: Negative for fever and malaise/fatigue.   HENT:  Negative for nosebleeds.    Cardiovascular:  Positive for dyspnea on exertion. Negative for chest pain and palpitations.   Respiratory:  Negative for cough and shortness of breath.    Musculoskeletal:  Negative for back pain, muscle cramps, muscle weakness and myalgias.   Gastrointestinal:  Negative for abdominal pain.   Neurological:   Negative for dizziness.        Physical Exam  Blood pressure 110/70, pulse 80, height 6' (1.829 m), weight 257 lb (116.6 kg).  Physical Exam  HENT:      Head: Normocephalic and atraumatic.   Eyes:      Extraocular Movements: Extraocular movements intact.   Cardiovascular:      Rate and Rhythm: Normal rate and regular rhythm.      Heart sounds: No murmur heard.  Pulmonary:      Effort: Pulmonary effort is normal.      Breath sounds: Normal breath sounds.   Abdominal:      Palpations: Abdomen is soft.   Musculoskeletal:         General: Normal range of motion.   Skin:     General: Skin is warm and dry.   Neurological:      General: No focal deficit present.      Mental Status: He is oriented to person, place, and time.           Medical problems, medical history and test results were reviewed with the patient today.      No results found for: "CHOL"  No results found for: "TRIG"  No results found for: "HDL"  No results found for: "LDLCHOLESTEROL", "LDLCALC"  No results found for: "LABVLDL", "VLDL"  No results found for: "CHOLHDLRATIO"     No results found for: "LABA1C"  No results found for: "EAG"     Lab Results   Component Value Date    NA 138 04/17/2018    K 4.2 04/17/2018    CL 105 04/17/2018    CO2 23 04/17/2018    BUN 29 (H) 04/17/2018    CREATININE 1.44 04/17/2018    GLUCOSE 112 (H) 04/17/2018    CALCIUM 9.2 04/17/2018    PROT 7.7 04/17/2018    LABALBU 4.2 04/17/2018    BILITOT 0.3 04/17/2018    ALKPHOS  89 04/17/2018    AST 21 04/17/2018    ALT 39 04/17/2018    GFRAA >60 04/17/2018    AGRATIO 1.2 04/17/2018    GLOB 3.5 04/17/2018       Lab Results   Component Value Date/Time    NA 138 04/17/2018 10:02 AM    K 4.2 04/17/2018 10:02 AM    CL 105 04/17/2018 10:02 AM    CO2 23 04/17/2018 10:02 AM    BUN 29 04/17/2018 10:02 AM    CREATININE 1.44 04/17/2018 10:02 AM    GLUCOSE 112 04/17/2018 10:02 AM    CALCIUM 9.2 04/17/2018 10:02 AM        Lab Results   Component Value Date    WBC 4.3 04/17/2018    HGB 14.3  04/17/2018    HCT 41.9 04/17/2018    MCV 91.1 04/17/2018    PLT 148 (L) 04/17/2018             ASSESSMENT:    Steven [name] was seen today for hypertension and hyperlipidemia.    Diagnoses and all orders for this visit:    Dyspnea on exertion -patient with worsening dyspnea on exertion.  Patient is struggled with chronic back pain.  He has now lost weight.  He does report some improvement.  He has a strong family history of premature coronary disease.  Patient had normal stress MPI 05/2022.      Primary Hypertension - pressures have been much improved.  Olmesartan/HCTZ 40/12.5mg  daily    Hypercholesterolemia -continue atorvastatin 40mg  daily.      Morbid obesity ([place]) -patient is focusing on diet and weight loss.  He has lost nearly 30 pounds recently.    OSA (obstructive sleep apnea) -patient reports compliance with CPAP    Venous insufficiency of both lower extremities -stable with support stockings.        Problem List Items Addressed This Visit          Circulatory    Venous insufficiency of both lower extremities    Essential hypertension, benign - Primary       Respiratory    OSA (obstructive sleep apnea)       Other    Morbid obesity ([place])    Hypercholesterolemia         Medications Discontinued During This Encounter   Medication Reason    meclizine (ANTIVERT) 25 MG tablet Therapy completed           Patient has been instructed and agrees to call our office with any issues or other concerns related to their cardiac condition(s) and/or complaint(s).      Return in about 1 year (around 07/21/2023).       Melina Modena, MD  07/20/2022

## 2022-07-23 MED ORDER — ALBUTEROL SULFATE HFA 108 (90 BASE) MCG/ACT IN AERS: INHALATION_SPRAY | RESPIRATORY_TRACT | 5 refills | Status: DC

## 2022-07-23 NOTE — Addendum Note (Signed)
Addended by: Marnee Guarneri T on: 07/23/2022 03:50 PM   Modules accepted: Orders

## 2022-07-29 ENCOUNTER — Encounter: Payer: Self-pay | Admitting: Emergency Medicine

## 2022-07-30 ENCOUNTER — Other Ambulatory Visit (HOSPITAL_COMMUNITY): Payer: Self-pay

## 2022-07-30 NOTE — Telephone Encounter (Signed)
Per test claim through pharmacy benefit, copay for 28 day supply of Xolair is $993.57. Referral to infusion center will need to be placed.  Patient has enough Xolair at home to continue to self-inject until dose that is due on 10/16/22. He will need to transition to [employer] center for the Xolair dose that is due on 10/16/22  Knox Saliva, PharmD, MPH, BCPS, CPP Clinical Pharmacist (Rheumatology and Pulmonology)

## 2022-07-30 NOTE — Telephone Encounter (Signed)
Devki, please see mychart message sent by pt and advise. 

## 2022-08-05 NOTE — Patient Instructions (Signed)

## 2022-08-07 ENCOUNTER — Encounter: Payer: Self-pay | Admitting: Pulmonary Disease

## 2022-08-07 DIAGNOSIS — J4540 Moderate persistent asthma, uncomplicated: Secondary | ICD-10-CM | POA: Insufficient documentation

## 2022-08-07 NOTE — Telephone Encounter (Signed)
Referral to [facility] with plan start date for 09/28/22.  Patient will be due for dose on 10/16/22 at infusion center. Spoke with Maudie Mercury - she states that she will run benefits early December.  Knox Saliva, PharmD, MPH, BCPS, CPP Clinical Pharmacist (Rheumatology and Pulmonology)

## 2022-08-08 ENCOUNTER — Ambulatory Visit (INDEPENDENT_AMBULATORY_CARE_PROVIDER_SITE_OTHER): Payer: MEDICARE | Admitting: Nurse Practitioner

## 2022-08-08 ENCOUNTER — Encounter: Payer: Self-pay | Admitting: Nurse Practitioner

## 2022-08-08 ENCOUNTER — Encounter: Payer: Self-pay | Admitting: Pulmonary Disease

## 2022-08-08 VITALS — BP 113/69 | HR 87 | Temp 98.3°F | Ht 70.75 in | Wt 328.8 lb

## 2022-08-08 DIAGNOSIS — M1990 Unspecified osteoarthritis, unspecified site: Secondary | ICD-10-CM | POA: Diagnosis not present

## 2022-08-08 DIAGNOSIS — Z8781 Personal history of (healed) traumatic fracture: Secondary | ICD-10-CM

## 2022-08-08 NOTE — Assessment & Plan Note (Signed)
At this time his mobility is affected longer distances, which is affecting his quality of life -- unable to walk longer distances.  Will work on IT trainer scooter/POV for patient.
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
## 2022-08-08 NOTE — Assessment & Plan Note (Signed)
Chronic back pain -- At this time his mobility is affected longer distances, which is affecting his quality of life -- unable to walk longer distances.  Will work on IT trainer scooter/POV for patient.

## 2022-08-09 MED ORDER — ALBUTEROL SULFATE HFA 108 (90 BASE) MCG/ACT IN AERS
INHALATION_SPRAY | RESPIRATORY_TRACT | 5 refills | Status: DC
Start: 2022-08-09 — End: 2024-02-20

## 2022-08-09 NOTE — Addendum Note (Signed)
Addended by: Marnee Guarneri T on: 08/09/2022 04:21 PM   Modules accepted: Orders

## 2022-08-22 NOTE — Addendum Note (Signed)
Addended by: Marnee Guarneri T on: 08/22/2022 12:13 PM   Modules accepted: Orders

## 2022-08-31 ENCOUNTER — Telehealth: Payer: Self-pay | Admitting: Pharmacy Technician

## 2022-08-31 NOTE — Telephone Encounter (Signed)
Auth Submission: NO AUTH NEEDED Payer: MEDICARE A/B & [employer]IG[employer] SUPP Medication & CPT/J Code(s) submitted: Xolair (Omalizumab) [id number] Route of submission (phone, fax, portal):  Phone # Fax # Auth type: Buy/Bill Units/visits requested: Q4WKS Reference number: [name] ([employer] VERIFY) Approval from: 08/31/22 to 10/29/23

## 2022-09-01 ENCOUNTER — Encounter: Payer: Self-pay | Admitting: Emergency Medicine

## 2022-09-03 ENCOUNTER — Encounter: Payer: Self-pay | Admitting: Nurse Practitioner

## 2022-09-04 ENCOUNTER — Other Ambulatory Visit: Payer: MEDICARE

## 2022-09-04 DIAGNOSIS — N400 Enlarged prostate without lower urinary tract symptoms: Secondary | ICD-10-CM

## 2022-09-04 DIAGNOSIS — J4540 Moderate persistent asthma, uncomplicated: Secondary | ICD-10-CM

## 2022-09-04 DIAGNOSIS — I10 Essential (primary) hypertension: Secondary | ICD-10-CM

## 2022-09-04 DIAGNOSIS — R7309 Other abnormal glucose: Secondary | ICD-10-CM

## 2022-09-04 DIAGNOSIS — K219 Gastro-esophageal reflux disease without esophagitis: Secondary | ICD-10-CM

## 2022-09-05 LAB — TSH: TSH: 0.703 u[IU]/mL (ref 0.450–4.500)

## 2022-09-05 LAB — CBC WITH DIFFERENTIAL/PLATELET
Basophils Absolute: 0.1 10*3/uL (ref 0.0–0.2)
Basos: 2 %
EOS (ABSOLUTE): 0.2 10*3/uL (ref 0.0–0.4)
Eos: 4 %
Hematocrit: 44.8 % (ref 37.5–51.0)
Hemoglobin: 15.1 g/dL (ref 13.0–17.7)
Immature Grans (Abs): 0 10*3/uL (ref 0.0–0.1)
Immature Granulocytes: 0 %
Lymphocytes Absolute: 1.2 10*3/uL (ref 0.7–3.1)
Lymphs: 21 %
MCH: 30.8 pg (ref 26.6–33.0)
MCHC: 33.7 g/dL (ref 31.5–35.7)
MCV: 91 fL (ref 79–97)
Monocytes Absolute: 0.7 10*3/uL (ref 0.1–0.9)
Monocytes: 12 %
Neutrophils Absolute: 3.6 10*3/uL (ref 1.4–7.0)
Neutrophils: 61 %
Platelets: 231 10*3/uL (ref 150–450)
RBC: 4.91 x10E6/uL (ref 4.14–5.80)
RDW: 12.7 % (ref 11.6–15.4)
WBC: 5.8 10*3/uL (ref 3.4–10.8)

## 2022-09-05 LAB — MAGNESIUM: Magnesium: 2.2 mg/dL (ref 1.6–2.3)

## 2022-09-05 LAB — COMPREHENSIVE METABOLIC PANEL
ALT: 16 IU/L (ref 0–44)
AST: 17 IU/L (ref 0–40)
Albumin/Globulin Ratio: 2.1 (ref 1.2–2.2)
Albumin: 4.5 g/dL (ref 3.9–4.9)
Alkaline Phosphatase: 69 IU/L (ref 44–121)
BUN/Creatinine Ratio: 35 — ABNORMAL HIGH (ref 10–24)
BUN: 34 mg/dL — ABNORMAL HIGH (ref 8–27)
Bilirubin Total: 0.4 mg/dL (ref 0.0–1.2)
CO2: 24 mmol/L (ref 20–29)
Calcium: 9.6 mg/dL (ref 8.6–10.2)
Chloride: 100 mmol/L (ref 96–106)
Creatinine, Ser: 0.97 mg/dL (ref 0.76–1.27)
Globulin, Total: 2.1 g/dL (ref 1.5–4.5)
Glucose: 108 mg/dL — ABNORMAL HIGH (ref 70–99)
Potassium: 4.2 mmol/L (ref 3.5–5.2)
Sodium: 138 mmol/L (ref 134–144)
Total Protein: 6.6 g/dL (ref 6.0–8.5)
eGFR: 87 mL/min/{1.73_m2} (ref >59)

## 2022-09-05 LAB — LIPID PANEL W/O CHOL/HDL RATIO
Cholesterol, Total: 186 mg/dL (ref 100–199)
HDL: 54 mg/dL (ref >39)
LDL Chol Calc (NIH): 120 mg/dL — ABNORMAL HIGH (ref 0–99)
Triglycerides: 64 mg/dL (ref 0–149)
VLDL Cholesterol Cal: 12 mg/dL (ref 5–40)

## 2022-09-05 LAB — HEMOGLOBIN A1C
Est. average glucose Bld gHb Est-mCnc: 114 mg/dL
Hgb A1c MFr Bld: 5.6 % (ref 4.8–5.6)

## 2022-09-05 LAB — PSA: Prostate Specific Ag, Serum: 1.3 ng/mL (ref 0.0–4.0)

## 2022-09-05 NOTE — Progress Notes (Signed)
Contacted via MyChart The 10-year ASCVD risk score (Arnett DK, et al., 2019) is: 10.9%   Values used to calculate the score:     Age: 66 years     Sex: Male     Is Non-Hispanic African American: No     Diabetic: No     Tobacco smoker: No     Systolic Blood Pressure: 035 mmHg     Is BP treated: Yes     HDL Cholesterol: 54 mg/dL     Total Cholesterol: 186 mg/dL   Good evening Jose Liu, your labs have returned and we will review more at physical: - BUN and BUN/creatinine mildly elevated, this is related to you fasting and having reduced hydration during the time.   - Kidney function, creatinine and eGFR, remains normal, as is liver function, AST and ALT.  - Cholesterol levels continue to show elevation in LDL, which we will discuss at visit. - Remainder of labs are stable.  No concerns on them.  Any questions? Keep being inspiring!!  Thank you for allowing me to participate in your care.  I appreciate you. Kindest regards, Ollis Daudelin

## 2022-09-09 NOTE — Patient Instructions (Signed)

## 2022-09-12 ENCOUNTER — Ambulatory Visit (INDEPENDENT_AMBULATORY_CARE_PROVIDER_SITE_OTHER): Payer: MEDICARE | Admitting: Nurse Practitioner

## 2022-09-12 ENCOUNTER — Encounter: Payer: Self-pay | Admitting: Nurse Practitioner

## 2022-09-12 VITALS — BP 117/69 | HR 80 | Temp 98.2°F | Ht 70.5 in | Wt 328.3 lb

## 2022-09-12 DIAGNOSIS — J4540 Moderate persistent asthma, uncomplicated: Secondary | ICD-10-CM

## 2022-09-12 DIAGNOSIS — I700 Atherosclerosis of aorta: Secondary | ICD-10-CM

## 2022-09-12 DIAGNOSIS — R7309 Other abnormal glucose: Secondary | ICD-10-CM

## 2022-09-12 DIAGNOSIS — I10 Essential (primary) hypertension: Secondary | ICD-10-CM

## 2022-09-12 DIAGNOSIS — J3081 Allergic rhinitis due to animal (cat) (dog) hair and dander: Secondary | ICD-10-CM

## 2022-09-12 DIAGNOSIS — Q893 Situs inversus: Secondary | ICD-10-CM

## 2022-09-12 DIAGNOSIS — G4733 Obstructive sleep apnea (adult) (pediatric): Secondary | ICD-10-CM

## 2022-09-12 DIAGNOSIS — M5416 Radiculopathy, lumbar region: Secondary | ICD-10-CM

## 2022-09-12 DIAGNOSIS — K219 Gastro-esophageal reflux disease without esophagitis: Secondary | ICD-10-CM

## 2022-09-12 NOTE — Assessment & Plan Note (Signed)
Chronic, ongoing.  Continue current medication regimen and adjust as needed.  Avoid elements that cause reactions, such as animals.

## 2022-09-12 NOTE — Assessment & Plan Note (Signed)
BMI 46.44.  Recommended eating smaller high protein, low fat meals more frequently and exercising 30 mins a day 5 times a week with a goal of 10-15lb weight loss in the next 3 months. Patient voiced their understanding and motivation to adhere to these recommendations.

## 2022-09-12 NOTE — Assessment & Plan Note (Signed)
Chronic, stable.  No recent exacerbations.  Followed by pulmonary and allergist. Continue current medication regimen and adjust as needed, minimal use at this time of Albuterol.  Continue collaboration with pulmonary and review notes.  Is on ACE, if cough or refractory symptoms present then will determine alternate option for BP, such as Amlodipine or Losartan. Return in 6 months.

## 2022-09-12 NOTE — Assessment & Plan Note (Signed)
Diagnosed several years ago per patient.  Continue to monitor and consider referral to ENT if worsening symptoms.

## 2022-09-12 NOTE — Assessment & Plan Note (Signed)
Chronic. Noted on CT abdomen on 03/02/21.  Recommend statin use + daily ASA for prevention.
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
## 2022-09-12 NOTE — Assessment & Plan Note (Signed)
Chronic with acute flare.  Continue Gabapentin as ordered + Ibuprofen as needed.  Consider physical therapy if worsening.  Heat and ice alternating.

## 2022-09-12 NOTE — Assessment & Plan Note (Signed)
Chronic, stable.  Continue current medication regimen and adjust as needed.  Consider referral to GI if worsening symptoms.  Reports history of multiple EGD.  Is on Pepcid and PPI, this is long standing and also benefits his asthma, as in past reports GERD symptoms exacerbated asthma symptoms.  Mag level up to date.

## 2022-09-12 NOTE — Assessment & Plan Note (Signed)
Chronic, stable.  BP at goal in office today.  Recommend he monitor BP at least a few mornings a week at home and document.  DASH diet at home.  Continue current medication regimen and adjust as needed.  Labs today: up to date.  Return in 6 months.

## 2022-09-12 NOTE — Assessment & Plan Note (Signed)
Noted on past labs, is diet focused. Recent A1c stable.

## 2022-09-12 NOTE — Assessment & Plan Note (Signed)
Chronic, stable with good compliance to CPAP.  Continue to utilize daily.

## 2022-10-12 ENCOUNTER — Encounter: Payer: Self-pay | Admitting: Pulmonary Disease

## 2022-10-18 ENCOUNTER — Ambulatory Visit (INDEPENDENT_AMBULATORY_CARE_PROVIDER_SITE_OTHER): Payer: MEDICARE | Admitting: Emergency Medicine

## 2022-10-18 ENCOUNTER — Ambulatory Visit (INDEPENDENT_AMBULATORY_CARE_PROVIDER_SITE_OTHER): Payer: MEDICARE

## 2022-10-18 ENCOUNTER — Encounter: Payer: Self-pay | Admitting: Emergency Medicine

## 2022-10-18 VITALS — BP 126/74 | HR 89 | Temp 97.8°F | Ht 71.0 in | Wt 332.4 lb

## 2022-10-18 VITALS — BP 145/76 | HR 92 | Temp 98.7°F | Resp 22 | Ht 71.0 in | Wt 333.0 lb

## 2022-10-18 DIAGNOSIS — J4540 Moderate persistent asthma, uncomplicated: Secondary | ICD-10-CM

## 2022-10-18 DIAGNOSIS — G4733 Obstructive sleep apnea (adult) (pediatric): Secondary | ICD-10-CM

## 2022-10-18 DIAGNOSIS — J3081 Allergic rhinitis due to animal (cat) (dog) hair and dander: Secondary | ICD-10-CM

## 2022-10-18 MED ORDER — OMALIZUMAB 150 MG/ML ~~LOC~~ SOSY
300.0000 mg | PREFILLED_SYRINGE | Freq: Once | SUBCUTANEOUS | Status: AC
  Administered 2022-10-18: 300 mg via SUBCUTANEOUS
  Filled 2022-10-18 (×2): qty 2

## 2022-10-18 NOTE — Assessment & Plan Note (Signed)
Continue your Singulair, fluticasone nasal spray, loratadine as you have been taking them.

## 2022-10-18 NOTE — Assessment & Plan Note (Signed)
Continue to get your Xolair once every 4 weeks Keep albuterol available to use 2 puffs if needed for shortness of breath, chest tightness, wheezing, mucus clearance During the time of year when your asthma is most active (spring and fall) would recommend that you do your budesonide nebulizer treatments 2 times a day every day on a schedule. Follow Dr. Lamonte Sakai in 1 year, sooner if you have any problems.
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
## 2022-10-18 NOTE — Patient Instructions (Addendum)
Continue to get your Xolair once every 4 weeks Keep albuterol available to use 2 puffs if needed for shortness of breath, chest tightness, wheezing, mucus clearance During the time of year when your asthma is most active (spring and fall) would recommend that you do your budesonide nebulizer treatments 2 times a day every day on a schedule. Continue your Singulair, fluticasone nasal spray, loratadine as you have been taking them. We reviewed your CPAP compliance.  Continue use your CPAP every night reliably. Follow Dr. Lamonte Sakai in 1 year, sooner if you have any problems.

## 2022-10-18 NOTE — Assessment & Plan Note (Signed)
We reviewed your CPAP compliance.  Continue use your CPAP every night reliably.

## 2022-10-18 NOTE — Progress Notes (Signed)
Diagnosis: Asthma  Provider:  Marshell Garfinkel MD  Procedure: Injection  Xolair (Omalizumab), Dose: 300 mg, Site: subcutaneous, Number of injections: 2   Discharge: Condition: Good, Destination: Home . AVS provided to patient.   Performed by:  Koren Shiver, RN

## 2022-10-25 ENCOUNTER — Encounter: Payer: Self-pay | Admitting: Pulmonary Disease

## 2022-10-26 ENCOUNTER — Encounter: Payer: Self-pay | Admitting: Nurse Practitioner

## 2022-10-31 NOTE — Telephone Encounter (Signed)
Xolair has been scheduled for 11/15/2022. Closing encounter as nothing further is needed at this time  Knox Saliva, PharmD, MPH, BCPS, CPP Clinical Pharmacist (Rheumatology and Pulmonology)

## 2022-11-09 ENCOUNTER — Encounter: Payer: Self-pay | Admitting: Nurse Practitioner

## 2022-11-15 ENCOUNTER — Ambulatory Visit: Payer: MEDICARE

## 2022-11-15 VITALS — BP 155/75 | HR 80 | Temp 98.7°F | Resp 16 | Ht 71.0 in | Wt 330.6 lb

## 2022-11-15 DIAGNOSIS — J4540 Moderate persistent asthma, uncomplicated: Secondary | ICD-10-CM | POA: Diagnosis not present

## 2022-11-15 MED ORDER — OMALIZUMAB 150 MG/ML ~~LOC~~ SOSY
300.0000 mg | PREFILLED_SYRINGE | Freq: Once | SUBCUTANEOUS | Status: AC
  Administered 2022-11-15: 300 mg via SUBCUTANEOUS
  Filled 2022-11-15: qty 2

## 2022-11-15 NOTE — Progress Notes (Signed)
Diagnosis: Asthma  Provider:  Marshell Garfinkel MD  Procedure: Injection  Xolair (Omalizumab), Dose: 300 mg, Site: subcutaneous, Number of injections: 2  Post Care:  n/a  Discharge: Condition: Good, Destination: Home . AVS provided to patient.   Performed by:  Adelina Mings, LPN

## 2022-11-16 ENCOUNTER — Encounter: Payer: Self-pay | Admitting: Nurse Practitioner

## 2022-12-03 ENCOUNTER — Encounter: Payer: Self-pay | Admitting: Nurse Practitioner

## 2022-12-13 ENCOUNTER — Ambulatory Visit: Payer: MEDICARE

## 2022-12-13 VITALS — BP 135/75 | HR 78 | Temp 98.0°F | Resp 20

## 2022-12-13 DIAGNOSIS — J4540 Moderate persistent asthma, uncomplicated: Secondary | ICD-10-CM

## 2022-12-13 MED ORDER — OMALIZUMAB 150 MG/ML ~~LOC~~ SOSY
300.0000 mg | PREFILLED_SYRINGE | Freq: Once | SUBCUTANEOUS | Status: AC
  Administered 2022-12-13: 300 mg via SUBCUTANEOUS
  Filled 2022-12-13: qty 2

## 2022-12-13 NOTE — Progress Notes (Signed)
Diagnosis: Asthma  Provider:  Marshell Garfinkel MD  Procedure: Injection  Xolair (Omalizumab), Dose: 300 mg, Site: subcutaneous, Number of injections: 2  Post Care:  n/a  Discharge: Condition: Good, Destination: Home . AVS Provided  Performed by:  Adelina Mings, LPN

## 2023-01-10 ENCOUNTER — Ambulatory Visit: Payer: MEDICARE

## 2023-01-10 VITALS — BP 117/74 | HR 87 | Temp 98.3°F | Resp 22 | Ht 71.0 in | Wt 320.0 lb

## 2023-01-10 DIAGNOSIS — J4540 Moderate persistent asthma, uncomplicated: Secondary | ICD-10-CM

## 2023-01-10 MED ORDER — OMALIZUMAB 150 MG/ML ~~LOC~~ SOSY
300.0000 mg | PREFILLED_SYRINGE | Freq: Once | SUBCUTANEOUS | Status: AC
  Administered 2023-01-10: 300 mg via SUBCUTANEOUS

## 2023-01-10 NOTE — Progress Notes (Signed)
Diagnosis: Asthma  Provider:  Marshell Garfinkel MD  Procedure: Injection  Xolair (Omalizumab), Dose: 150 mg, Site: subcutaneous, Number of injections: 2  Right arm  '150mg'$ /ml Left arm  '150mg'$ /ml   Post Care: Patient declined observation  Discharge: Condition: Good, Destination: Home . AVS Declined  Performed by:  Fraser Din Pilking[place]-Burchett, RN

## 2023-01-30 ENCOUNTER — Encounter

## 2023-02-06 ENCOUNTER — Encounter: Payer: Self-pay | Admitting: Pulmonary Disease

## 2023-02-06 ENCOUNTER — Encounter: Payer: Self-pay | Admitting: Nurse Practitioner

## 2023-02-06 MED ORDER — PREDNISONE 10 MG PO TABS: ORAL_TABLET | ORAL | 0 refills | Status: DC

## 2023-02-06 MED ORDER — TIZANIDINE HCL 4 MG PO CAPS: 4.0000 mg | ORAL_CAPSULE | Freq: Three times a day (TID) | ORAL | 0 refills | Status: DC

## 2023-02-07 ENCOUNTER — Ambulatory Visit: Payer: MEDICARE

## 2023-02-07 ENCOUNTER — Other Ambulatory Visit: Payer: Self-pay

## 2023-02-07 ENCOUNTER — Encounter: Payer: Self-pay | Admitting: Pulmonary Disease

## 2023-02-07 VITALS — BP 129/81 | HR 109 | Temp 98.0°F | Resp 20 | Ht 71.0 in | Wt 326.0 lb

## 2023-02-07 DIAGNOSIS — J4540 Moderate persistent asthma, uncomplicated: Secondary | ICD-10-CM | POA: Diagnosis not present

## 2023-02-07 MED ORDER — OMALIZUMAB 150 MG/ML ~~LOC~~ SOSY
300.0000 mg | PREFILLED_SYRINGE | Freq: Once | SUBCUTANEOUS | Status: AC
  Administered 2023-02-07: 300 mg via SUBCUTANEOUS

## 2023-02-07 MED ORDER — TIZANIDINE HCL 4 MG PO TABS: 4.0000 mg | ORAL_TABLET | Freq: Four times a day (QID) | ORAL | 0 refills | Status: DC | PRN

## 2023-02-07 NOTE — Telephone Encounter (Signed)
Received message from Pharmacy stating that Tizanidine is not covered but the preferred alt is Tizanidine HCL.

## 2023-02-07 NOTE — Addendum Note (Signed)
Addended by: Aura Dials T on: 02/07/2023 12:06 PM   Modules accepted: Orders

## 2023-02-07 NOTE — Progress Notes (Signed)
Diagnosis: Asthma  Provider:  Chilton Greathouse MD  Procedure: Injection  Xolair (Omalizumab), Dose: 300 mg, Site: subcutaneous, Number of injections: 2 - Right arm, Left arm  Post Care:  patient has had medication before - no observation period needed   Discharge: Condition: Good, Destination: Home . AVS Declined  Performed by:  Marlow Baars Pilkington-Burchett, RN

## 2023-02-12 ENCOUNTER — Encounter: Payer: Self-pay | Admitting: Nurse Practitioner

## 2023-02-24 NOTE — Patient Instructions (Signed)
Chronic Back Pain Chronic back pain is back pain that lasts longer than 3 months. The pain may get worse at certain times (flare-ups). There are things you can do at home to manage your pain. Follow these instructions at home: Watch for any changes in your symptoms. Take these actions to help with your pain: Managing pain and stiffness     If told, put ice on the painful area. You may be told to use ice for 24-48 hours after a flare-up starts. Put ice in a plastic bag. Place a towel between your skin and the bag. Leave the ice on for 20 minutes, 2-3 times a day. If told, put heat on the painful area. Do this as often as told by your doctor. Use the heat source that your doctor recommends, such as a moist heat pack or a heating pad. Place a towel between your skin and the heat source. Leave the heat on for 20-30 minutes. If your skin turns bright red, take off the ice or heat right away to prevent skin damage. The risk of damage is higher if you cannot feel pain, heat, or cold. Soak in a warm bath. This can help with pain. Activity        Avoid bending and other activities that make the pain worse. When you stand: Keep your upper back and neck straight. Keep your shoulders pulled back. Avoid slouching. When you sit: Keep your back straight. Relax your shoulders. Do not round your shoulders or pull them backward. Do not sit or stand in one place for too long. Take short rest breaks during the day. Lying down or standing is often better than sitting. Resting can help relieve pain. When sitting or lying down for a long time, do some mild activity or stretching. This will help to prevent stiffness and pain. Get regular exercise. Ask your doctor what activities are safe for you. You may have to avoid lifting. Ask your provider how much you can safely lift. If you lift things: Bend your knees. Keep the weight close to your body. Avoid twisting. Medicines Take over-the-counter and  prescription medicines only as told by your doctor. You may need to take medicines for pain and swelling. These may be taken by mouth or put on the skin. You may also be given muscle relaxants. Ask your doctor if the medicine prescribed to you: Requires you to avoid driving or using machinery. Can cause trouble pooping (constipation). You may need to take these actions to prevent or treat trouble pooping: Drink enough fluid to keep your pee (urine) pale yellow. Take over-the-counter or prescription medicines. Eat foods that are high in fiber. These include beans, whole grains, and fresh fruits and vegetables. Limit foods that are high in fat and sugars. These include fried or sweet foods. General instructions  Sleep on a firm mattress. Try lying on your side with your knees slightly bent. If you lie on your back, put a pillow under your knees. Do not smoke or use any products that contain nicotine or tobacco. If you need help quitting, ask your doctor. Contact a doctor if: Your pain does not get better with rest or medicine. You have new pain. You have a fever. You lose weight quickly. You have trouble doing your normal activities. One or both of your legs or feet feel weak. One or both of your legs or feet lose feeling (have numbness). Get help right away if: You are not able to control when you pee   or poop. You have bad back pain and: You feel like you may vomit (nauseous). You vomit. You have pain in your chest or your belly (abdomen). You have shortness of breath. You faint. These symptoms may be an emergency. Get help right away. Call 911. Do not wait to see if the symptoms will go away. Do not drive yourself to the hospital. This information is not intended to replace advice given to you by your health care provider. Make sure you discuss any questions you have with your health care provider. Document Revised: 06/04/2022 Document Reviewed: 06/04/2022 Elsevier Patient Education   2023 Else[employer] Inc.  

## 2023-02-27 ENCOUNTER — Encounter: Payer: Self-pay | Admitting: Nurse Practitioner

## 2023-02-27 ENCOUNTER — Ambulatory Visit (INDEPENDENT_AMBULATORY_CARE_PROVIDER_SITE_OTHER): Payer: MEDICARE | Admitting: Nurse Practitioner

## 2023-02-27 VITALS — BP 119/71 | HR 82 | Temp 98.2°F | Ht 70.98 in | Wt 324.3 lb

## 2023-02-27 DIAGNOSIS — M5416 Radiculopathy, lumbar region: Secondary | ICD-10-CM

## 2023-02-27 DIAGNOSIS — L720 Epidermal cyst: Secondary | ICD-10-CM | POA: Insufficient documentation

## 2023-02-27 MED ORDER — TIZANIDINE HCL 4 MG PO TABS: 4.0000 mg | ORAL_TABLET | Freq: Four times a day (QID) | ORAL | 2 refills | Status: AC | PRN
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
## 2023-02-27 NOTE — Assessment & Plan Note (Signed)
To right posterior shoulder, currently no symptoms with this.  Recommend continue to monitor and if any symptoms present, then head to general surgery.

## 2023-02-27 NOTE — Assessment & Plan Note (Signed)
Chronic, ongoing.  Continue current regimen at home and adjust as needed.  No further Gabapentin due to side effects with this. Tizanidine refills sent in.  Recommend PT if worsening pain.

## 2023-03-07 ENCOUNTER — Ambulatory Visit (INDEPENDENT_AMBULATORY_CARE_PROVIDER_SITE_OTHER): Payer: MEDICARE

## 2023-03-07 VITALS — BP 114/62 | HR 86 | Temp 98.0°F | Resp 20 | Ht 71.0 in | Wt 326.0 lb

## 2023-03-07 DIAGNOSIS — J4540 Moderate persistent asthma, uncomplicated: Secondary | ICD-10-CM

## 2023-03-07 MED ORDER — OMALIZUMAB 150 MG/ML ~~LOC~~ SOSY
300.0000 mg | PREFILLED_SYRINGE | Freq: Once | SUBCUTANEOUS | Status: AC
  Administered 2023-03-07: 300 mg via SUBCUTANEOUS
  Filled 2023-03-07: qty 2

## 2023-03-07 NOTE — Progress Notes (Signed)
Diagnosis: Asthma  Provider:  Chilton Greathouse MD  Procedure: Injection  Xolair (Omalizumab), Dose: 300 mg, Site: subcutaneous, Number of injections: 2  Post Care:  n/a  Discharge: Condition: Good, Destination: Home . AVS Provided  Performed by:  Loney Hering, LPN

## 2023-03-10 DIAGNOSIS — E7800 Pure hypercholesterolemia, unspecified: Secondary | ICD-10-CM | POA: Insufficient documentation

## 2023-03-10 NOTE — Patient Instructions (Incomplete)
Talk to wife about whether you would want CPR, ventilator (how long on ventilator if so), feeding tube, and dialysis.  Be Involved in Your Health Care:  Taking Medications When medications are taken as directed, they can greatly improve your health. But if they are not taken as instructed, they may not work. In some cases, not taking them correctly can be harmful. To help ensure your treatment remains effective and safe, understand your medications and how to take them.  Your lab results, notes and after visit summary will be available on My Chart. We strongly encourage you to use this feature. If lab results are abnormal the clinic will contact you with the appropriate steps. If the clinic does not contact you assume the results are satisfactory. You can always see your results on My Chart. If you have questions regarding your condition, please contact the clinic during office hours. You can also ask questions on My Chart.  We at [facility] are grateful that you chose [place] to provide care. We strive to provide excellent and compassionate care and are always looking for feedback. If you get a survey from the clinic please complete this.   Healthy Eating, Adult Healthy eating may help you get and keep a healthy body weight, reduce the risk of chronic disease, and live a long and productive life. It is important to follow a healthy eating pattern. Your nutritional and calorie needs should be met mainly by different nutrient-rich foods. What are tips for following this plan? Reading food labels Read labels and choose the following: Reduced or low sodium products. Juices with 100% fruit juice. Foods with low saturated fats (<3 g per serving) and high polyunsaturated and monounsaturated fats. Foods with whole grains, such as whole wheat, cracked wheat, brown rice, and wild rice. Whole grains that are fortified with folic acid. This is recommended for females who are pregnant or who want to  become pregnant. Read labels and do not eat or drink the following: Foods or drinks with added sugars. These include foods that contain brown sugar, corn sweetener, corn syrup, dextrose, fructose, glucose, high-fructose corn syrup, honey, invert sugar, lactose, malt syrup, maltose, molasses, raw sugar, sucrose, trehalose, or turbinado sugar. Limit your intake of added sugars to less than 10% of your total daily calories. Do not eat more than the following amounts of added sugar per day: 6 teaspoons (25 g) for females. 9 teaspoons (38 g) for males. Foods that contain processed or refined starches and grains. Refined grain products, such as white flour, degermed cornmeal, white bread, and white rice. Shopping Choose nutrient-rich snacks, such as vegetables, whole fruits, and nuts. Avoid high-calorie and high-sugar snacks, such as potato chips, fruit snacks, and candy. Use oil-based dressings and spreads on foods instead of solid fats such as butter, margarine, sour cream, or cream cheese. Limit pre-made sauces, mixes, and "instant" products such as flavored rice, instant noodles, and ready-made pasta. Try more plant-protein sources, such as tofu, tempeh, black beans, edamame, lentils, nuts, and seeds. Explore eating plans such as the Mediterranean diet or vegetarian diet. Try heart-healthy dips made with beans and healthy fats like hummus and guacamole. Vegetables go great with these. Cooking Use oil to saut or stir-fry foods instead of solid fats such as butter, margarine, or lard. Try baking, boiling, grilling, or broiling instead of frying. Remove the fatty part of meats before cooking. Steam vegetables in water or broth. Meal planning  At meals, imagine dividing your plate into fourths: One-half  of your plate is fruits and vegetables. One-fourth of your plate is whole grains. One-fourth of your plate is protein, especially lean meats, poultry, eggs, tofu, beans, or nuts. Include low-fat  dairy as part of your daily diet. Lifestyle Choose healthy options in all settings, including home, work, school, restaurants, or stores. Prepare your food safely: Wash your hands after handling raw meats. Where you prepare food, keep surfaces clean by regularly washing with hot, soapy water. Keep raw meats separate from ready-to-eat foods, such as fruits and vegetables. Cook seafood, meat, poultry, and eggs to the recommended temperature. Get a food thermometer. Store foods at safe temperatures. In general: Keep cold foods at 67F (4.4C) or below. Keep hot foods at 167F (60C) or above. Keep your freezer at [facility] (-17.8C) or below. Foods are not safe to eat if they have been between the temperatures of 40-167F (4.4-60C) for more than 2 hours. What foods should I eat? Fruits Aim to eat 1-2 cups of fresh, canned (in natural juice), or frozen fruits each day. One cup of fruit equals 1 small apple, 1 large banana, 8 large strawberries, 1 cup (237 g) canned fruit,  cup (82 g) dried fruit, or 1 cup (240 mL) 100% juice. Vegetables Aim to eat 2-4 cups of fresh and frozen vegetables each day, including different varieties and colors. One cup of vegetables equals 1 cup (91 g) broccoli or cauliflower florets, 2 medium carrots, 2 cups (150 g) raw, leafy greens, 1 large tomato, 1 large bell pepper, 1 large sweet potato, or 1 medium white potato. Grains Aim to eat 5-10 ounce-equivalents of whole grains each day. Examples of 1 ounce-equivalent of grains include 1 slice of bread, 1 cup (40 g) ready-to-eat cereal, 3 cups (24 g) popcorn, or  cup (93 g) cooked rice. Meats and other proteins Try to eat 5-7 ounce-equivalents of protein each day. Examples of 1 ounce-equivalent of protein include 1 egg,  oz nuts (12 almonds, 24 pistachios, or 7 walnut halves), 1/4 cup (90 g) cooked beans, 6 tablespoons (90 g) hummus or 1 tablespoon (16 g) peanut butter. A cut of meat or fish that is the size of a deck of  cards is about 3-4 ounce-equivalents (85 g). Of the protein you eat each week, try to have at least 8 sounce (227 g) of seafood. This is about 2 servings per week. This includes salmon, trout, herring, sardines, and anchovies. Dairy Aim to eat 3 cup-equivalents of fat-free or low-fat dairy each day. Examples of 1 cup-equivalent of dairy include 1 cup (240 mL) milk, 8 ounces (250 g) yogurt, 1 ounces (44 g) natural cheese, or 1 cup (240 mL) fortified soy milk. Fats and oils Aim for about 5 teaspoons (21 g) of fats and oils per day. Choose monounsaturated fats, such as canola and olive oils, mayonnaise made with olive oil or avocado oil, avocados, peanut butter, and most nuts, or polyunsaturated fats, such as sunflower, corn, and soybean oils, walnuts, pine nuts, sesame seeds, sunflower seeds, and flaxseed. Beverages Aim for 6 eight-ounce glasses of water per day. Limit coffee to 3-5 eight-ounce cups per day. Limit caffeinated beverages that have added calories, such as soda and energy drinks. If you drink alcohol: Limit how much you have to: 0-1 drink a day if you are male. 0-2 drinks a day if you are male. Know how much alcohol is in your drink. In the [place] one drink is one 12 oz bottle of beer (355 mL), one 5 oz glass of  wine (148 mL), or one 1 oz glass of hard liquor (44 mL). Seasoning and other foods Try not to add too much salt to your food. Try using herbs and spices instead of salt. Try not to add sugar to food. This information is based on [place] nutrition guidelines. To learn more, visit [employer]. Exact amounts may vary. You may need different amounts. This information is not intended to replace advice given to you by your health care provider. Make sure you discuss any questions you have with your health care provider. Document Revised: 07/16/2022 Document Reviewed: 07/16/2022 Elsevier Patient Education  2023 [employer].

## 2023-03-13 ENCOUNTER — Encounter: Payer: Self-pay | Admitting: Nurse Practitioner

## 2023-03-13 ENCOUNTER — Ambulatory Visit (INDEPENDENT_AMBULATORY_CARE_PROVIDER_SITE_OTHER): Payer: MEDICARE | Admitting: Nurse Practitioner

## 2023-03-13 VITALS — BP 119/68 | HR 76 | Temp 98.2°F | Ht 70.98 in | Wt 326.1 lb

## 2023-03-13 DIAGNOSIS — L720 Epidermal cyst: Secondary | ICD-10-CM

## 2023-03-13 DIAGNOSIS — Z23 Encounter for immunization: Secondary | ICD-10-CM | POA: Diagnosis not present

## 2023-03-13 DIAGNOSIS — Z0000 Encounter for general adult medical examination without abnormal findings: Secondary | ICD-10-CM

## 2023-03-13 DIAGNOSIS — I10 Essential (primary) hypertension: Secondary | ICD-10-CM | POA: Diagnosis not present

## 2023-03-13 DIAGNOSIS — E7800 Pure hypercholesterolemia, unspecified: Secondary | ICD-10-CM

## 2023-03-13 DIAGNOSIS — R7309 Other abnormal glucose: Secondary | ICD-10-CM

## 2023-03-13 DIAGNOSIS — Q893 Situs inversus: Secondary | ICD-10-CM

## 2023-03-13 DIAGNOSIS — G4733 Obstructive sleep apnea (adult) (pediatric): Secondary | ICD-10-CM

## 2023-03-13 DIAGNOSIS — I700 Atherosclerosis of aorta: Secondary | ICD-10-CM

## 2023-03-13 DIAGNOSIS — J3081 Allergic rhinitis due to animal (cat) (dog) hair and dander: Secondary | ICD-10-CM

## 2023-03-13 DIAGNOSIS — J4540 Moderate persistent asthma, uncomplicated: Secondary | ICD-10-CM

## 2023-03-13 DIAGNOSIS — K219 Gastro-esophageal reflux disease without esophagitis: Secondary | ICD-10-CM

## 2023-03-13 DIAGNOSIS — N400 Enlarged prostate without lower urinary tract symptoms: Secondary | ICD-10-CM

## 2023-03-13 NOTE — Assessment & Plan Note (Signed)
Diagnosed several years ago per patient.  Continue to monitor and consider referral to ENT if worsening symptoms. 

## 2023-03-13 NOTE — Assessment & Plan Note (Signed)
BMI 45.50.  Recommended eating smaller high protein, low fat meals more frequently and exercising 30 mins a day 5 times a week with a goal of 10-15lb weight loss in the next 3 months. Patient voiced their understanding and motivation to adhere to these recommendations.

## 2023-03-13 NOTE — Assessment & Plan Note (Signed)
Chronic, stable.  BP at goal in office today.  Recommend he monitor BP at least a few mornings a week at home and document.  DASH diet at home.  Continue current medication regimen and adjust as needed.  Labs today: CBC, CMP, TSH.  Return in 6 months.

## 2023-03-13 NOTE — Assessment & Plan Note (Signed)
Chronic, stable.  No recent exacerbations.  Followed by pulmonary and allergist. Continue current medication regimen and adjust as needed, minimal use at this time of Albuterol.  Continue collaboration with pulmonary and review notes.  Is on ACE, if cough or refractory symptoms present then will determine alternate option for BP, such as Amlodipine or Losartan. Return in 6 months.   

## 2023-03-13 NOTE — Assessment & Plan Note (Signed)
Slightly larger on exam today and some pain noted by patient.  Will obtain imaging and send to general surgery as needed dependent on imaging results.  Discussed with patient.

## 2023-03-13 NOTE — Assessment & Plan Note (Signed)
Chronic. Noted on CT abdomen on 03/02/21.  Recommend statin use + daily ASA for prevention. 

## 2023-03-13 NOTE — Assessment & Plan Note (Signed)
Chronic, ongoing.  Continue current medication regimen and adjust as needed.  Avoid elements that cause reactions, such as animals. ?

## 2023-03-13 NOTE — Assessment & Plan Note (Signed)
Noted on past labs, wishes not to start statin therapy.  Recheck labs today and recommend treatment based on results. The 10-year ASCVD risk score (Arnett DK, et al., 2019) is: 11.9%   Values used to calculate the score:     Age: 67 years     Sex: Male     Is Non-Hispanic African American: No     Diabetic: No     Tobacco smoker: No     Systolic Blood Pressure: 119 mmHg     Is BP treated: Yes     HDL Cholesterol: 54 mg/dL     Total Cholesterol: 186 mg/dL

## 2023-03-13 NOTE — Assessment & Plan Note (Signed)
Chronic, stable with good compliance to CPAP.  Continue to utilize daily. 

## 2023-03-13 NOTE — Assessment & Plan Note (Signed)
Noted on past labs, is diet focused. Recent A1c stable.  Recheck today.
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
## 2023-03-14 LAB — COMPREHENSIVE METABOLIC PANEL
ALT: 19 IU/L (ref 0–44)
AST: 13 IU/L (ref 0–40)
Albumin/Globulin Ratio: 2.1 (ref 1.2–2.2)
Albumin: 4.5 g/dL (ref 3.9–4.9)
Alkaline Phosphatase: 69 IU/L (ref 44–121)
BUN/Creatinine Ratio: 32 — ABNORMAL HIGH (ref 10–24)
BUN: 30 mg/dL — ABNORMAL HIGH (ref 8–27)
Bilirubin Total: 0.4 mg/dL (ref 0.0–1.2)
CO2: 25 mmol/L (ref 20–29)
Calcium: 9.7 mg/dL (ref 8.6–10.2)
Chloride: 101 mmol/L (ref 96–106)
Creatinine, Ser: 0.94 mg/dL (ref 0.76–1.27)
Globulin, Total: 2.1 g/dL (ref 1.5–4.5)
Glucose: 103 mg/dL — ABNORMAL HIGH (ref 70–99)
Potassium: 4.3 mmol/L (ref 3.5–5.2)
Sodium: 141 mmol/L (ref 134–144)
Total Protein: 6.6 g/dL (ref 6.0–8.5)
eGFR: 90 mL/min/{1.73_m2} (ref >59)

## 2023-03-14 LAB — CBC WITH DIFFERENTIAL/PLATELET
Basophils Absolute: 0.1 10*3/uL (ref 0.0–0.2)
Basos: 1 %
EOS (ABSOLUTE): 0.2 10*3/uL (ref 0.0–0.4)
Eos: 3 %
Hematocrit: 46 % (ref 37.5–51.0)
Hemoglobin: 15.6 g/dL (ref 13.0–17.7)
Immature Grans (Abs): 0 10*3/uL (ref 0.0–0.1)
Immature Granulocytes: 0 %
Lymphocytes Absolute: 1.3 10*3/uL (ref 0.7–3.1)
Lymphs: 21 %
MCH: 30.7 pg (ref 26.6–33.0)
MCHC: 33.9 g/dL (ref 31.5–35.7)
MCV: 91 fL (ref 79–97)
Monocytes Absolute: 0.7 10*3/uL (ref 0.1–0.9)
Monocytes: 12 %
Neutrophils Absolute: 3.9 10*3/uL (ref 1.4–7.0)
Neutrophils: 63 %
Platelets: 245 10*3/uL (ref 150–450)
RBC: 5.08 x10E6/uL (ref 4.14–5.80)
RDW: 12.5 % (ref 11.6–15.4)
WBC: 6.1 10*3/uL (ref 3.4–10.8)

## 2023-03-14 LAB — HEMOGLOBIN A1C
Est. average glucose Bld gHb Est-mCnc: 120 mg/dL
Hgb A1c MFr Bld: 5.8 % — ABNORMAL HIGH (ref 4.8–5.6)

## 2023-03-14 LAB — LIPID PANEL W/O CHOL/HDL RATIO
Cholesterol, Total: 184 mg/dL (ref 100–199)
HDL: 55 mg/dL (ref >39)
LDL Chol Calc (NIH): 112 mg/dL — ABNORMAL HIGH (ref 0–99)
Triglycerides: 91 mg/dL (ref 0–149)
VLDL Cholesterol Cal: 17 mg/dL (ref 5–40)

## 2023-03-14 LAB — PSA: Prostate Specific Ag, Serum: 1.4 ng/mL (ref 0.0–4.0)

## 2023-03-14 LAB — MAGNESIUM: Magnesium: 2.2 mg/dL (ref 1.6–2.3)

## 2023-03-14 LAB — TSH: TSH: 0.771 u[IU]/mL (ref 0.450–4.500)

## 2023-03-14 NOTE — Progress Notes (Signed)
Contacted via MyChart   Good morning Jose Liu, your labs have returned: - Kidney function, creatinine and eGFR, remains normal, as is liver function, AST and ALT.  - A1c is in prediabetic range at 5.8% -- prediabetes being 5.7 to 6.4% and diabetes 6.5% or greater.  Focus heavily on diet and regular activity to avoid this trending up. - LDL on cholesterol labs remains elevated and ASCVD (cardiac risk score) when calculated is elevated, I do recommend statin therapy for this to help with prevention, however I also know you prefer less medications.  Thoughts on statin therapy? I will place your risk score below. - Remainder of labs look great.  Any questions? Keep being amazing!!  Thank you for allowing me to participate in your care.  I appreciate you. Kindest regards, Daysie Helf  The 10-year ASCVD risk score (Arnett DK, et al., 2019) is: 11.7%   Values used to calculate the score:     Age: 67 years     Sex: Male     Is Non-Hispanic African American: No     Diabetic: No     Tobacco smoker: No     Systolic Blood Pressure: 119 mmHg     Is BP treated: Yes     HDL Cholesterol: 55 mg/dL     Total Cholesterol: 184 mg/dL

## 2023-04-03 ENCOUNTER — Ambulatory Visit: Payer: 59

## 2023-04-04 ENCOUNTER — Ambulatory Visit: Payer: MEDICARE | Admitting: *Deleted

## 2023-04-04 VITALS — BP 145/70 | HR 89 | Temp 98.2°F | Resp 16 | Ht 71.0 in | Wt 331.8 lb

## 2023-04-04 DIAGNOSIS — J4540 Moderate persistent asthma, uncomplicated: Secondary | ICD-10-CM

## 2023-04-04 MED ORDER — OMALIZUMAB 150 MG/ML ~~LOC~~ SOSY
300.0000 mg | PREFILLED_SYRINGE | Freq: Once | SUBCUTANEOUS | Status: AC
  Administered 2023-04-04: 300 mg via SUBCUTANEOUS
  Filled 2023-04-04: qty 2

## 2023-04-04 NOTE — Progress Notes (Signed)
Diagnosis: Asthma  Provider:  Mannam, Praveen MD  Procedure: Injection  Xolair (Omalizumab), Dose: 300 mg, Site: subcutaneous, Number of injections: 2  Post Care: Observation period completed  Discharge: Condition: Good, Destination: Home . AVS Declined  Performed by:  Lachandra Dettmann A, RN       

## 2023-04-08 ENCOUNTER — Ambulatory Visit
Admission: RE | Admit: 2023-04-08 | Discharge: 2023-04-08 | Disposition: A | Discharge status: Home / Self Care | Payer: MEDICARE | Source: Ambulatory Visit | Attending: Nurse Practitioner | Admitting: Nurse Practitioner

## 2023-04-08 ENCOUNTER — Encounter: Payer: Self-pay | Admitting: Nurse Practitioner

## 2023-04-08 DIAGNOSIS — L720 Epidermal cyst: Secondary | ICD-10-CM | POA: Insufficient documentation

## 2023-04-14 NOTE — Progress Notes (Signed)
Contacted via MyChart   Good afternoon Jose Liu, your imaging has returned and it looks like the area of concern is a lipoma -- this is a fat filled type of cyst-like mass that is non -cancerous.  If this continues to cause you discomfort due to the location then I would recommend a referral to general surgery for removal.  Would you like to pursue this?  Let me know and then I can place referral.   Keep being incredible!!  Thank you for allowing me to participate in your care.  I appreciate you. Kindest regards, Dutchess Crosland

## 2023-04-20 ENCOUNTER — Encounter: Payer: Self-pay | Admitting: Nurse Practitioner

## 2023-04-30 ENCOUNTER — Encounter: Payer: Self-pay | Admitting: Nurse Practitioner

## 2023-05-01 ENCOUNTER — Ambulatory Visit: Payer: MEDICARE

## 2023-05-01 VITALS — BP 128/66 | HR 80 | Temp 98.5°F | Resp 18 | Ht 71.0 in | Wt 328.4 lb

## 2023-05-01 DIAGNOSIS — J4540 Moderate persistent asthma, uncomplicated: Secondary | ICD-10-CM | POA: Diagnosis not present

## 2023-05-01 MED ORDER — FLUTICASONE PROPIONATE 50 MCG/ACT NA SUSP: NASAL | 6 refills | Status: DC

## 2023-05-01 MED ORDER — OMALIZUMAB 150 MG/ML ~~LOC~~ SOSY
300.0000 mg | PREFILLED_SYRINGE | Freq: Once | SUBCUTANEOUS | Status: AC
  Administered 2023-05-01: 300 mg via SUBCUTANEOUS
  Filled 2023-05-01: qty 2

## 2023-05-01 NOTE — Progress Notes (Signed)
Diagnosis: Asthma  Provider:  Chilton Greathouse MD  Procedure: Injection  Xolair (Omalizumab), Dose: 300 mg, Site: subcutaneous, Number of injections: 2  Post Care: Patient declined observation  Discharge: Condition: Good, Destination: Home . AVS Declined  Performed by:  Marlow Baars [place], RN

## 2023-05-26 NOTE — Patient Instructions (Incomplete)
Ganglion Cyst  A ganglion cyst is a non-cancerous, fluid-filled lump of tissue that occurs near a joint, tendon, or ligament. The cyst grows out of a joint or the lining of a tendon or ligament. Ganglion cysts most often develop in the hand or wrist, but they can also develop in the shoulder, elbow, hip, knee, ankle, or foot. Ganglion cysts are ball-shaped or egg-shaped. Their size can range from the size of a pea to larger than a grape. Increased activity may cause the cyst to get bigger because more fluid starts to build up. What are the causes? The exact cause of this condition is not known, but it may be related to: Inflammation or irritation around the joint. An injury or tear in the layers of tissue around the joint (joint capsule). Repetitive movements or overuse. History of acute or repeated injury. What increases the risk? You are more likely to develop this condition if: You are a male. You are 66-50 years old. What are the signs or symptoms? The main symptom of this condition is a lump. It most often appears on the hand or wrist. In many cases, there are no other symptoms, but a cyst can sometimes cause: Tingling. Pain or tenderness. Numbness. Weakness or loss of strength in the affected joint. Decreased range of motion in the affected area of the body. How is this diagnosed? Ganglion cysts are usually diagnosed based on a physical exam. Your health care provider will feel the lump and may shine a light next to it. If it is a ganglion cyst, the light will likely shine through it. Your health care provider may order an X-ray, ultrasound, MRI, or CT scan to rule out other conditions. How is this treated? Ganglion cysts often go away on their own without treatment. If you have pain or other symptoms, treatment may be needed. Treatment is also needed if the ganglion cyst limits your movement or if it gets infected. Treatment may include: Wearing a brace or splint on your wrist or  finger. Taking anti-inflammatory medicine. Having fluid drained from the lump with a needle (aspiration). Getting an injection of medicine into the joint to decrease inflammation. This may be corticosteroids, ethanol, or hyaluronidase. Having surgery to remove the ganglion cyst. Placing a pad in your shoe or wearing shoes that will not rub against the cyst if it is on your foot. Follow these instructions at home: Do not press on the ganglion cyst, poke it with a needle, or hit it. Take over-the-counter and prescription medicines only as told by your health care provider. If you have a brace or splint: Wear it as told by your health care provider. Remove it as told by your health care provider. Ask if you need to remove it when you take a shower or a bath. Watch your ganglion cyst for any changes. Keep all follow-up visits as told by your health care provider. This is important. Contact a health care provider if: Your ganglion cyst becomes larger or more painful. You have pus coming from the lump. You have weakness or numbness in the affected area. You have a fever or chills. Get help right away if: You have a fever and have any of these in the cyst area: Increased redness. Red streaks. Swelling. Summary A ganglion cyst is a non-cancerous, fluid-filled lump that occurs near a joint, tendon, or ligament. Ganglion cysts most often develop in the hand or wrist, but they can also develop in the shoulder, elbow, hip, knee, ankle, or  foot. Ganglion cysts often go away on their own without treatment. This information is not intended to replace advice given to you by your health care provider. Make sure you discuss any questions you have with your health care provider. Document Revised: 01/06/2020 Document Reviewed: 01/06/2020 Elsevier Patient Education  2024 [employer].

## 2023-05-29 ENCOUNTER — Ambulatory Visit (INDEPENDENT_AMBULATORY_CARE_PROVIDER_SITE_OTHER): Payer: MEDICARE | Admitting: Nurse Practitioner

## 2023-05-29 ENCOUNTER — Encounter: Payer: Self-pay | Admitting: Nurse Practitioner

## 2023-05-29 VITALS — BP 120/68 | HR 87 | Temp 98.5°F | Ht 70.8 in | Wt 328.0 lb

## 2023-05-29 DIAGNOSIS — M67431 Ganglion, right wrist: Secondary | ICD-10-CM | POA: Diagnosis not present

## 2023-05-29 DIAGNOSIS — D170 Benign lipomatous neoplasm of skin and subcutaneous tissue of head, face and neck: Secondary | ICD-10-CM | POA: Diagnosis not present

## 2023-05-29 NOTE — Progress Notes (Signed)
BP 120/68   Pulse 87   Temp 98.5 F (36.9 C) (Oral)   Ht 5' 10.8" (1.798 m)   Wt (!) 328 lb (148.8 kg)   SpO2 96%   BMI 46.01 kg/m    Subjective:    Patient ID: Jose Liu, male    DOB: [birth year], 67 y.o.   MRN: [id number]  HPI: Jose Liu is a 67 y.o. male  Chief Complaint  Patient presents with   Cyst    Pt states he has a cyst on his R wrist that he would like checked out. States he first noticed it about a month ago. States the area decreased in size over the last few weeks but grew again within the last couple of days. States the area does hurt sometimes. States he thinks it is pressing on a nerve.    SKIN MASS To right wrist area. Recently started about one month ago, fluctuates in size -- when first started was larger, then went on vacation and it went down in size, then last week on Saturday it increased in size again and started aching.  Also has lipoma to right neck area, which was imaged on 04/08/23.  He would like to have this removed, as he believes it is causing discomfort to right arm.  Would like referral to general surgery for this and would also like referral to dermatology for overall skin exam -- previously saw Dr. Gwen Pounds in February 2023 for rosacea. Duration: weeks Location: right inner wrist Painful: at times Itching: no Onset: sudden Context:  fluctuates in size Associated signs and symptoms: as above History of skin cancer: no History of precancerous skin lesions: no Family history of skin cancer: yes, father   Relevant past medical, surgical, family and social history reviewed and updated as indicated. Interim medical history since our last visit reviewed. Allergies and medications reviewed and updated.  Review of Systems  Constitutional:  Negative for activity change, diaphoresis, fatigue and fever.  Respiratory:  Negative for cough, chest tightness, shortness of breath and wheezing.   Cardiovascular:  Negative for chest pain,  palpitations and leg swelling.  Gastrointestinal: Negative.   Endocrine: Negative.   Neurological: Negative.   Psychiatric/Behavioral: Negative.      Per HPI unless specifically indicated above     Objective:    BP 120/68   Pulse 87   Temp 98.5 F (36.9 C) (Oral)   Ht 5' 10.8" (1.798 m)   Wt (!) 328 lb (148.8 kg)   SpO2 96%   BMI 46.01 kg/m   Wt Readings from Last 3 Encounters:  05/29/23 (!) 328 lb (148.8 kg)  05/01/23 (!) 328 lb 6 oz (148.9 kg)  04/04/23 (!) 331 lb 12.8 oz (150.5 kg)    Physical Exam Vitals and nursing note reviewed.  Constitutional:      General: He is awake. He is not in acute distress.    Appearance: He is well-developed and well-groomed. He is morbidly obese. He is not ill-appearing or toxic-appearing.  HENT:     Head: Normocephalic and atraumatic.     Right Ear: Hearing, tympanic membrane, ear canal and external ear normal. No drainage.     Left Ear: Hearing, tympanic membrane, ear canal and external ear normal. No drainage.  Eyes:     General: Lids are normal.        Right eye: No discharge.        Left eye: No discharge.     Conjunctiva/sclera: Conjunctivae normal.  Pupils: Pupils are equal, round, and reactive to light.  Neck:     Thyroid: No thyromegaly.     Vascular: No carotid bruit.     Trachea: Trachea normal.  Cardiovascular:     Rate and Rhythm: Normal rate and regular rhythm.     Heart sounds: Normal heart sounds, S1 normal and S2 normal. No murmur heard.    No gallop.  Pulmonary:     Effort: Pulmonary effort is normal. No accessory muscle usage or respiratory distress.     Breath sounds: Normal breath sounds.  Abdominal:     General: Bowel sounds are normal.     Palpations: Abdomen is soft.  Musculoskeletal:        General: Normal range of motion.     Cervical back: Normal range of motion and neck supple.     Lumbar back: No swelling or tenderness. Normal range of motion.     Right lower leg: No edema.     Left lower  leg: No edema.  Skin:    General: Skin is warm and dry.     Capillary Refill: Capillary refill takes less than 2 seconds.       Neurological:     Mental Status: He is alert and oriented to person, place, and time.     Deep Tendon Reflexes: Reflexes are normal and symmetric.     Reflex Scores:      Brachioradialis reflexes are 2+ on the right side and 2+ on the left side.      Patellar reflexes are 2+ on the right side and 2+ on the left side. Psychiatric:        Attention and Perception: Attention normal.        Mood and Affect: Mood normal.        Speech: Speech normal.        Behavior: Behavior normal. Behavior is cooperative.        Thought Content: Thought content normal.     Results for orders placed or performed in visit on 03/13/23  CBC with Differential/Platelet  Result Value Ref Range   WBC 6.1 3.4 - 10.8 x10E3/uL   RBC 5.08 4.14 - 5.80 x10E6/uL   Hemoglobin 15.6 13.0 - 17.7 g/dL   Hematocrit 16.1 09.6 - 51.0 %   MCV 91 79 - 97 fL   MCH 30.7 26.6 - 33.0 pg   MCHC 33.9 31.5 - 35.7 g/dL   RDW 04.5 40.9 - 81.1 %   Platelets 245 150 - 450 x10E3/uL   Neutrophils 63 Not Estab. %   Lymphs 21 Not Estab. %   Monocytes 12 Not Estab. %   Eos 3 Not Estab. %   Basos 1 Not Estab. %   Neutrophils Absolute 3.9 1.4 - 7.0 x10E3/uL   Lymphocytes Absolute 1.3 0.7 - 3.1 x10E3/uL   Monocytes Absolute 0.7 0.1 - 0.9 x10E3/uL   EOS (ABSOLUTE) 0.2 0.0 - 0.4 x10E3/uL   Basophils Absolute 0.1 0.0 - 0.2 x10E3/uL   Immature Granulocytes 0 Not Estab. %   Immature Grans (Abs) 0.0 0.0 - 0.1 x10E3/uL  Comprehensive metabolic panel  Result Value Ref Range   Glucose 103 (H) 70 - 99 mg/dL   BUN 30 (H) 8 - 27 mg/dL   Creatinine, Ser 9.14 0.76 - 1.27 mg/dL   eGFR 90 >78 GN/FAO/1.30   BUN/Creatinine Ratio 32 (H) 10 - 24   Sodium 141 134 - 144 mmol/L   Potassium 4.3 3.5 - 5.2 mmol/L  Chloride 101 96 - 106 mmol/L   CO2 25 20 - 29 mmol/L   Calcium 9.7 8.6 - 10.2 mg/dL   Total Protein 6.6 6.0  - 8.5 g/dL   Albumin 4.5 3.9 - 4.9 g/dL   Globulin, Total 2.1 1.5 - 4.5 g/dL   Albumin/Globulin Ratio 2.1 1.2 - 2.2   Bilirubin Total 0.4 0.0 - 1.2 mg/dL   Alkaline Phosphatase 69 44 - 121 IU/L   AST 13 0 - 40 IU/L   ALT 19 0 - 44 IU/L  Lipid Panel w/o Chol/HDL Ratio  Result Value Ref Range   Cholesterol, Total 184 100 - 199 mg/dL   Triglycerides 91 0 - 149 mg/dL   HDL 55 >86 mg/dL   VLDL Cholesterol Cal 17 5 - 40 mg/dL   LDL Chol Calc (NIH) 578 (H) 0 - 99 mg/dL  Magnesium  Result Value Ref Range   Magnesium 2.2 1.6 - 2.3 mg/dL  TSH  Result Value Ref Range   TSH 0.771 0.450 - 4.500 uIU/mL  PSA  Result Value Ref Range   Prostate Specific Ag, Serum 1.4 0.0 - 4.0 ng/mL  HgB A1c  Result Value Ref Range   Hgb A1c MFr Bld 5.8 (H) 4.8 - 5.6 %   Est. average glucose Bld gHb Est-mCnc 120 mg/dL      Assessment & Plan:   Problem List Items Addressed This Visit       Musculoskeletal and Integument   Lipoma of neck    Noted on ultrasound to right side of neck, does cause some discomfort.  Referral to general surgery to discuss surgical options for removal -- risks and benefits.  For skin exams he will return to Dr. Gwen Pounds.      Relevant Orders   Ambulatory referral to General Surgery     Other   Ganglion cyst of wrist, right - Primary    Noticed over past month, fluctuates in size.  Educated him on this and treatment methods used from OTC methods to draining or surgery.  At this time he wishes to use OTC methods such as Ibuprofen and wrist support.  If worsening will alert provider.       Time: 25 minutes, >50% spent counseling/or care coordination   Follow up plan: Return if symptoms worsen or fail to improve.

## 2023-05-29 NOTE — Assessment & Plan Note (Signed)
Noticed over past month, fluctuates in size.  Educated him on this and treatment methods used from OTC methods to draining or surgery.  At this time he wishes to use OTC methods such as Ibuprofen and wrist support.  If worsening will alert provider.

## 2023-05-29 NOTE — Assessment & Plan Note (Signed)
Noted on ultrasound to right side of neck, does cause some discomfort.  Referral to general surgery to discuss surgical options for removal -- risks and benefits.  For skin exams he will return to Dr. Gwen Pounds.

## 2023-05-30 ENCOUNTER — Encounter: Payer: Self-pay | Admitting: Pulmonary Disease

## 2023-05-30 ENCOUNTER — Ambulatory Visit (INDEPENDENT_AMBULATORY_CARE_PROVIDER_SITE_OTHER): Payer: MEDICARE | Admitting: *Deleted

## 2023-05-30 VITALS — BP 138/71 | HR 95 | Temp 98.2°F | Resp 16 | Ht 71.0 in | Wt 329.4 lb

## 2023-05-30 DIAGNOSIS — J4540 Moderate persistent asthma, uncomplicated: Secondary | ICD-10-CM | POA: Diagnosis not present

## 2023-05-30 MED ORDER — OMALIZUMAB 150 MG/ML ~~LOC~~ SOSY
300.0000 mg | PREFILLED_SYRINGE | Freq: Once | SUBCUTANEOUS | Status: AC
  Administered 2023-05-30: 300 mg via SUBCUTANEOUS
  Filled 2023-05-30: qty 2

## 2023-05-30 NOTE — Progress Notes (Addendum)
Diagnosis: Asthma  Provider:  Chilton Greathouse MD  Procedure: Injection  Xolair (Omalizumab), Dose: 300 mg, Site: subcutaneous, Number of injections: 2  Post Care: Observation period completed  Discharge: Condition: Stable, Destination: Home . AVS Provided  Performed by:  Forrest Moron, RN
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
## 2023-06-04 ENCOUNTER — Ambulatory Visit (INDEPENDENT_AMBULATORY_CARE_PROVIDER_SITE_OTHER): Payer: MEDICARE | Admitting: Surgery

## 2023-06-04 ENCOUNTER — Encounter: Payer: Self-pay | Admitting: Surgery

## 2023-06-04 VITALS — BP 121/71 | HR 84 | Temp 98.0°F | Ht 70.8 in | Wt 323.0 lb

## 2023-06-04 DIAGNOSIS — Z6841 Body Mass Index (BMI) 40.0 and over, adult: Secondary | ICD-10-CM | POA: Diagnosis not present

## 2023-06-04 DIAGNOSIS — D170 Benign lipomatous neoplasm of skin and subcutaneous tissue of head, face and neck: Secondary | ICD-10-CM

## 2023-06-04 DIAGNOSIS — K4090 Unilateral inguinal hernia, without obstruction or gangrene, not specified as recurrent: Secondary | ICD-10-CM | POA: Diagnosis not present

## 2023-06-04 NOTE — Patient Instructions (Addendum)
If you decide that you would like to have this area removed in the future please call us.   Lipoma  A lipoma is a noncancerous (benign) tumor that is made up of fat cells. This is a very common type of soft-tissue growth. Lipomas are usually found under the skin (subcutaneous). They may occur in any tissue of the body that contains fat. Common areas for lipomas to appear include the back, arms, shoulders, buttocks, and thighs. Lipomas grow slowly, and they are usually painless. Most lipomas do not cause problems and do not require treatment. What are the causes? The cause of this condition is not known. What increases the risk? You are more likely to develop this condition if: You are 49-24 years old. You have a family history of lipomas. What are the signs or symptoms? A lipoma usually appears as a small, round bump under the skin. In most cases, the lump will: Feel soft or rubbery. Not cause pain or other symptoms. However, if a lipoma is located in an area where it pushes on nerves, it can become painful or cause other symptoms. How is this diagnosed? A lipoma can usually be diagnosed with a physical exam. You may also have tests to confirm the diagnosis and to rule out other conditions. Tests may include: Imaging tests, such as a CT scan or an MRI. Removal of a tissue sample to be looked at under a microscope (biopsy). How is this treated? Treatment for this condition depends on the size of the lipoma and whether it is causing any symptoms. For small lipomas that are not causing problems, no treatment is needed. If a lipoma is bigger or it causes problems, surgery may be done to remove the lipoma. Lipomas can also be removed to improve appearance. Most often, the procedure is done after applying a medicine that numbs the area (local anesthetic). Liposuction may be done to reduce the size of the lipoma before it is removed through surgery, or it may be done to remove the lipoma. Lipomas  are removed with this method to limit incision size and scarring. A liposuction tube is inserted through a small incision into the lipoma, and the contents of the lipoma are removed through the tube with suction. Follow these instructions at home: Watch your lipoma for any changes. Keep all follow-up visits. This is important. Where to find more information OrthoInfo: orthoinfo.aaos.org Contact a health care provider if: Your lipoma becomes larger or hard. Your lipoma becomes painful, red, or increasingly swollen. These could be signs of infection or a more serious condition. Get help right away if: You develop tingling or numbness in an area near the lipoma. This could indicate that the lipoma is causing nerve damage. Summary A lipoma is a noncancerous tumor that is made up of fat cells. Most lipomas do not cause problems and do not require treatment. If a lipoma is bigger or it causes problems, surgery may be done to remove the lipoma. Contact a health care provider if your lipoma becomes larger or hard, or if it becomes painful, red, or increasingly swollen. These could be signs of infection or a more serious condition. This information is not intended to replace advice given to you by your health care provider. Make sure you discuss any questions you have with your health care provider. Document Revised: 11/03/2021 Document Reviewed: 11/03/2021 Elsevier Patient Education  2024 [employer].

## 2023-06-15 ENCOUNTER — Encounter: Payer: Self-pay | Admitting: Pulmonary Disease

## 2023-06-24 ENCOUNTER — Encounter: Payer: Self-pay | Admitting: Pulmonary Disease

## 2023-06-27 ENCOUNTER — Ambulatory Visit: Payer: MEDICARE

## 2023-06-27 ENCOUNTER — Encounter: Payer: Self-pay | Admitting: Pulmonary Disease

## 2023-06-27 VITALS — BP 125/69 | HR 79 | Temp 98.1°F | Resp 18 | Ht 71.0 in | Wt 327.6 lb

## 2023-06-27 DIAGNOSIS — J4540 Moderate persistent asthma, uncomplicated: Secondary | ICD-10-CM

## 2023-06-27 MED ORDER — OMALIZUMAB 150 MG/ML ~~LOC~~ SOSY
300.0000 mg | PREFILLED_SYRINGE | Freq: Once | SUBCUTANEOUS | Status: AC
  Administered 2023-06-27: 300 mg via SUBCUTANEOUS
  Filled 2023-06-27: qty 2

## 2023-06-27 NOTE — Progress Notes (Signed)
Diagnosis: Asthma  Provider:  Chilton Greathouse MD  Procedure: Injection  Xolair (Omalizumab), Dose: 300 mg, Site: subcutaneous, Number of injections: 2   Discharge: Condition: Good, Destination: Home . AVS Declined  Performed by:  Nat Math, RN

## 2023-07-25 ENCOUNTER — Ambulatory Visit: Payer: MEDICARE

## 2023-07-25 VITALS — BP 125/73 | HR 85 | Temp 98.1°F | Resp 16 | Ht 71.0 in | Wt 329.6 lb

## 2023-07-25 DIAGNOSIS — J4540 Moderate persistent asthma, uncomplicated: Secondary | ICD-10-CM

## 2023-07-25 MED ORDER — OMALIZUMAB 150 MG/ML ~~LOC~~ SOSY
300.0000 mg | PREFILLED_SYRINGE | Freq: Once | SUBCUTANEOUS | Status: AC
  Administered 2023-07-25: 300 mg via SUBCUTANEOUS
  Filled 2023-07-25: qty 2

## 2023-07-25 NOTE — Progress Notes (Addendum)
Diagnosis: Asthma  Provider:  Chilton Greathouse MD  Procedure: Injection  Xolair (Omalizumab), Dose: 300 mg, Site: subcutaneous, Number of injections: 2  Post Care: Right and left arm injection  Discharge: Condition: Good, Destination: Home . AVS Declined  Performed by:  Rico Ala, LPN

## 2023-07-27 ENCOUNTER — Other Ambulatory Visit: Payer: Self-pay | Admitting: Nurse Practitioner

## 2023-07-29 ENCOUNTER — Encounter: Payer: Self-pay | Admitting: Nurse Practitioner

## 2023-07-29 MED ORDER — FAMOTIDINE 40 MG PO TABS: 40.0000 mg | ORAL_TABLET | Freq: Every day | ORAL | 4 refills | Status: DC

## 2023-07-29 MED ORDER — MONTELUKAST SODIUM 10 MG PO TABS: ORAL_TABLET | ORAL | 4 refills | Status: DC

## 2023-07-29 MED ORDER — LISINOPRIL-HYDROCHLOROTHIAZIDE 10-12.5 MG PO TABS: 1.0000 | ORAL_TABLET | Freq: Every day | ORAL | 4 refills | Status: DC

## 2023-07-29 MED ORDER — PANTOPRAZOLE SODIUM 40 MG PO TBEC: 40.0000 mg | DELAYED_RELEASE_TABLET | Freq: Every day | ORAL | 4 refills | Status: DC

## 2023-07-29 NOTE — Telephone Encounter (Signed)
Lisinopril/hydrochlorothiazide: 07/06/22 #90 4 RF Famotidine: 07/06/22 #90 4 RF Montelukast: 07/06/22 #90 4 RF Pantoprazole: 07/06/22 #90 4 RF  Requested Prescriptions  Refused Prescriptions Disp Refills   lisinopril-hydrochlorothiazide (ZESTORETIC) 10-12.5 MG tablet [Pharmacy Med Name: LISINOPRIL-HCTZ 10/12.5MG  TABLETS] 90 tablet 4    Sig: TAKE 1 TABLET BY MOUTH DAILY     Cardiovascular:  ACEI + Diuretic Combos Passed - 07/27/2023  3:38 AM      Passed - Na in normal range and within 180 days    Sodium  Date Value Ref Range Status  03/13/2023 141 134 - 144 mmol/L Final         Passed - K in normal range and within 180 days    Potassium  Date Value Ref Range Status  03/13/2023 4.3 3.5 - 5.2 mmol/L Final         Passed - Cr in normal range and within 180 days    Creatinine, Ser  Date Value Ref Range Status  03/13/2023 0.94 0.76 - 1.27 mg/dL Final         Passed - eGFR is 30 or above and within 180 days    GFR calc Af Amer  Date Value Ref Range Status  12/18/2019 99 >59 mL/min/1.73 Final   GFR, Estimated  Date Value Ref Range Status  03/02/2021 >60 >60 mL/min Final    Comment:    (NOTE) Calculated using the CKD-EPI Creatinine Equation (2021)    eGFR  Date Value Ref Range Status  03/13/2023 90 >59 mL/min/1.73 Final         Passed - Patient is not pregnant      Passed - Last BP in normal range    BP Readings from Last 1 Encounters:  07/25/23 125/73         Passed - Valid encounter within last 6 months    Recent Outpatient Visits           2 months ago Ganglion cyst of wrist, right   [place] [facility] [place], Corrie Dandy T, NP   4 months ago Medicare welcome visit   [place] [facility] [place], Corrie Dandy T, NP   5 months ago Lumbar radiculopathy   [place] [facility] [place], Corrie Dandy T, NP   10 months ago Aortic atherosclerosis (HCC)   [place] [facility] [place], Corrie Dandy T, NP   11 months ago History of  spinal fracture   [place] [facility] [place], Dorie Rank, NP       Future Appointments             In 1 month [place], Dorie Rank, NP [place] [facility]             famotidine (PEPCID) 40 MG tablet [Pharmacy Med Name: FAMOTIDINE 40MG  TABLETS] 90 tablet 4    Sig: TAKE 1 TABLET(40 MG) BY MOUTH DAILY     Gastroenterology:  H2 Antagonists Passed - 07/27/2023  3:38 AM      Passed - Valid encounter within last 12 months    Recent Outpatient Visits           2 months ago Ganglion cyst of wrist, right   [place] [facility] [place], Corrie Dandy T, NP   4 months ago Medicare welcome visit   [place] [facility] [place], Camilla T, NP   5 months ago Lumbar radiculopathy   [place] [facility] [place], Langley T, NP   10 months  ago Aortic atherosclerosis (HCC)   [place] [facility] [place], Corrie Dandy T, NP   11 months ago History of spinal fracture   [place] [facility] [place], Dorie Rank, NP       Future Appointments             In 1 month Cannady, Dorie Rank, NP [place] [facility]             montelukast (SINGULAIR) 10 MG tablet [Pharmacy Med Name: MONTELUKAST 10MG  TABLETS] 90 tablet 4    Sig: TAKE 1 TABLET BY MOUTH EVERY DAY AT BEDTIME     Pulmonology:  Leukotriene Inhibitors Passed - 07/27/2023  3:38 AM      Passed - Valid encounter within last 12 months    Recent Outpatient Visits           2 months ago Ganglion cyst of wrist, right   [place] [facility] [place], Corrie Dandy T, NP   4 months ago Medicare welcome visit   [place] [facility] [place], [place] T, NP   5 months ago Lumbar radiculopathy   [place] [facility] [place], Corrie Dandy T, NP   10 months ago Aortic atherosclerosis (HCC)   [place] [facility] [place], Corrie Dandy T, NP   11 months ago History of spinal fracture    [place] [facility] [place], Dorie Rank, NP       Future Appointments             In 1 month Cannady, Dorie Rank, NP [place] [facility]             pantoprazole (PROTONIX) 40 MG tablet [Pharmacy Med Name: PANTOPRAZOLE 40MG  TABLETS] 90 tablet 4    Sig: TAKE 1 TABLET(40 MG) BY MOUTH DAILY     Gastroenterology: Proton Pump Inhibitors Passed - 07/27/2023  3:38 AM      Passed - Valid encounter within last 12 months    Recent Outpatient Visits           2 months ago Ganglion cyst of wrist, right   [place] [facility] [place], Corrie Dandy T, NP   4 months ago Medicare welcome visit   [place] [facility] [place], [place] T, NP   5 months ago Lumbar radiculopathy   [place] [facility] [place], Corrie Dandy T, NP   10 months ago Aortic atherosclerosis (HCC)    [facility] [place], Corrie Dandy T, NP   11 months ago History of spinal fracture    [facility] [place], Dorie Rank, NP       Future Appointments             In 1 month Cannady, Dorie Rank, NP  [facility]

## 2023-08-17 ENCOUNTER — Encounter: Payer: Self-pay | Admitting: Nurse Practitioner

## 2023-08-22 ENCOUNTER — Ambulatory Visit: Payer: MEDICARE

## 2023-08-22 VITALS — BP 110/66 | HR 82 | Temp 98.4°F | Resp 16 | Ht 71.0 in | Wt 326.0 lb

## 2023-08-22 DIAGNOSIS — J4540 Moderate persistent asthma, uncomplicated: Secondary | ICD-10-CM

## 2023-08-22 MED ORDER — OMALIZUMAB 150 MG/ML ~~LOC~~ SOSY
300.0000 mg | PREFILLED_SYRINGE | Freq: Once | SUBCUTANEOUS | Status: AC
  Administered 2023-08-22: 300 mg via SUBCUTANEOUS
  Filled 2023-08-22: qty 2

## 2023-08-22 NOTE — Progress Notes (Signed)
Diagnosis: Asthma  Provider:  Chilton Greathouse MD  Procedure: Injection  Xolair (Omalizumab), Dose: 300 mg, Site: subcutaneous, Number of injections: 2  Administered one injection each in right and left arm.  Post Care: Patient declined observation  Discharge: Condition: Stable, Destination: Home . AVS Declined  Performed by:  Wyvonne Lenz, RN

## 2023-09-15 ENCOUNTER — Encounter: Payer: Self-pay | Admitting: Nurse Practitioner

## 2023-09-15 NOTE — Patient Instructions (Signed)
Wegovy or Zepbound  Focus on DASH diet for high blood pressure or Mediterranean diet  Be Involved in Caring For Your Health:  Taking Medications When medications are taken as directed, they can greatly improve your health. But if they are not taken as prescribed, they may not work. In some cases, not taking them correctly can be harmful. To help ensure your treatment remains effective and safe, understand your medications and how to take them. Bring your medications to each visit for review by your provider.  Your lab results, notes, and after visit summary will be available on My Chart. We strongly encourage you to use this feature. If lab results are abnormal the clinic will contact you with the appropriate steps. If the clinic does not contact you assume the results are satisfactory. You can always view your results on My Chart. If you have questions regarding your health or results, please contact the clinic during office hours. You can also ask questions on My Chart.  We at [facility] are grateful that you chose us to provide your care. We strive to provide evidence-based and compassionate care and are always looking for feedback. If you get a survey from the clinic please complete this so we can hear your opinions.  Preventing High Cholesterol Cholesterol is a white, waxy substance similar to fat that the human body needs to help build cells. The liver makes all the cholesterol that a person's body needs. Having high cholesterol (hypercholesterolemia) increases your risk for heart disease and stroke. Extra or excess cholesterol comes from the food that you eat. High cholesterol can often be prevented with diet and lifestyle changes. If you already have high cholesterol, you can control it with diet, lifestyle changes, and medicines. How can high cholesterol affect me? If you have high cholesterol, fatty deposits (plaques) may build up on the walls of your blood vessels. The blood  vessels that carry blood away from your heart are called arteries. Plaques make the arteries narrower and stiffer. This in turn can: Restrict or block blood flow and cause blood clots to form. Increase your risk for heart attack and stroke. What can increase my risk for high cholesterol? This condition is more likely to develop in people who: Eat foods that are high in saturated fat or cholesterol. Saturated fat is mostly found in foods that come from animal sources. Are overweight. Are not getting enough exercise. Use products that contain nicotine or tobacco, such as cigarettes, e-cigarettes, and chewing tobacco. Have a family history of high cholesterol (familial hypercholesterolemia). What actions can I take to prevent this? Nutrition  Eat less saturated fat. Avoid trans fats (partially hydrogenated oils). These are often found in margarine and in some baked goods, fried foods, and snacks bought in packages. Avoid precooked or cured meat, such as bacon, sausages, or meat loaves. Avoid foods and drinks that have added sugars. Eat more fruits, vegetables, and whole grains. Choose healthy sources of protein, such as fish, poultry, lean cuts of red meat, beans, peas, lentils, and nuts. Choose healthy sources of fat, such as: Nuts. Vegetable oils, especially olive oil. Fish that have healthy fats, such as omega-3 fatty acids. These fish include mackerel or salmon. Lifestyle Lose weight if you are overweight. Maintaining a healthy body mass index (BMI) can help prevent or control high cholesterol. It can also lower your risk for diabetes and high blood pressure. Ask your health care provider to help you with a diet and exercise plan to lose   weight safely. Do not use any products that contain nicotine or tobacco. These products include cigarettes, chewing tobacco, and vaping devices, such as e-cigarettes. If you need help quitting, ask your health care provider. Alcohol use Do not drink  alcohol if: Your health care provider tells you not to drink. You are pregnant, may be pregnant, or are planning to become pregnant. If you drink alcohol: Limit how much you have to: 0-1 drink a day for women. 0-2 drinks a day for men. Know how much alcohol is in your drink. In the [place] one drink equals one 12 oz bottle of beer (355 mL), one 5 oz glass of wine (148 mL), or one 1 oz glass of hard liquor (44 mL). Activity  Get enough exercise. Do exercises as told by your health care provider. Each week, do at least 150 minutes of exercise that takes a medium level of effort (moderate-intensity exercise). This kind of exercise: Makes your heart beat faster while allowing you to still be able to talk. Can be done in short sessions several times a day or longer sessions a few times a week. For example, on 5 days each week, you could walk fast or ride your bike 3 times a day for 10 minutes each time. Medicines Your health care provider may recommend medicines to help lower cholesterol. This may be a medicine to lower the amount of cholesterol that your liver makes. You may need medicine if: Diet and lifestyle changes have not lowered your cholesterol enough. You have high cholesterol and other risk factors for heart disease or stroke. Take over-the-counter and prescription medicines only as told by your health care provider. General information Manage your risk factors for high cholesterol. Talk with your health care provider about all your risk factors and how to lower your risk. Manage other conditions that you have, such as diabetes or high blood pressure (hypertension). Have blood tests to check your cholesterol levels at regular points in time as told by your health care provider. Keep all follow-up visits. This is important. Where to find more information American Heart Association: www.heart.org National Heart, Lung, and Blood Institute: www.nhlbi.nih.gov Summary High cholesterol  increases your risk for heart disease and stroke. By keeping your cholesterol level low, you can reduce your risk for these conditions. High cholesterol can often be prevented with diet and lifestyle changes. Work with your health care provider to manage your risk factors, and have your blood tested regularly. This information is not intended to replace advice given to you by your health care provider. Make sure you discuss any questions you have with your health care provider. Document Revised: 05/18/2022 Document Reviewed: 12/19/2020 Elsevier Patient Education  2024 Elsevier Inc.  

## 2023-09-16 ENCOUNTER — Encounter: Payer: Self-pay | Admitting: Nurse Practitioner

## 2023-09-16 ENCOUNTER — Ambulatory Visit (INDEPENDENT_AMBULATORY_CARE_PROVIDER_SITE_OTHER): Payer: MEDICARE | Admitting: Nurse Practitioner

## 2023-09-16 ENCOUNTER — Encounter: Payer: Self-pay | Admitting: Pulmonary Disease

## 2023-09-16 VITALS — BP 110/65 | HR 69 | Temp 98.3°F | Ht 70.7 in | Wt 324.2 lb

## 2023-09-16 DIAGNOSIS — J4540 Moderate persistent asthma, uncomplicated: Secondary | ICD-10-CM | POA: Diagnosis not present

## 2023-09-16 DIAGNOSIS — I10 Essential (primary) hypertension: Secondary | ICD-10-CM

## 2023-09-16 DIAGNOSIS — K219 Gastro-esophageal reflux disease without esophagitis: Secondary | ICD-10-CM

## 2023-09-16 DIAGNOSIS — E7800 Pure hypercholesterolemia, unspecified: Secondary | ICD-10-CM

## 2023-09-16 DIAGNOSIS — E66813 Obesity, class 3: Secondary | ICD-10-CM

## 2023-09-16 DIAGNOSIS — Z6841 Body Mass Index (BMI) 40.0 and over, adult: Secondary | ICD-10-CM | POA: Diagnosis not present

## 2023-09-16 DIAGNOSIS — I700 Atherosclerosis of aorta: Secondary | ICD-10-CM

## 2023-09-16 DIAGNOSIS — G4733 Obstructive sleep apnea (adult) (pediatric): Secondary | ICD-10-CM

## 2023-09-16 DIAGNOSIS — R7309 Other abnormal glucose: Secondary | ICD-10-CM

## 2023-09-16 NOTE — Assessment & Plan Note (Signed)
Chronic, stable.  No recent exacerbations.  Followed by pulmonary and allergist. Continue current medication regimen and adjust as needed, minimal use at this time of Albuterol.  Continue collaboration with pulmonary and review notes.  Is on ACE, if cough or refractory symptoms present then will determine alternate option for BP, such as Amlodipine or Losartan. Return in 6 months.

## 2023-09-16 NOTE — Assessment & Plan Note (Signed)
Chronic, stable. Noted on CT abdomen on 03/02/21.  Recommend statin use + daily ASA for prevention.

## 2023-09-16 NOTE — Assessment & Plan Note (Signed)
Chronic, stable.  BP at goal in office today.  Recommend he monitor BP at least a few mornings a week at home and document.  DASH diet at home.  Continue current medication regimen and adjust as needed.  Labs today: CMP.  Return in 6 months.

## 2023-09-16 NOTE — Assessment & Plan Note (Signed)
Chronic, stable with good compliance to CPAP.  Continue to utilize daily. 

## 2023-09-16 NOTE — Assessment & Plan Note (Signed)
Chronic, stable.  Continue current medication regimen and adjust as needed.  Consider referral to GI if worsening symptoms.  Reports history of multiple EGD.  Is on Pepcid and PPI, this is long standing and also benefits his asthma, as in past reports GERD symptoms exacerbated asthma symptoms.  Mag level up to date.

## 2023-09-16 NOTE — Assessment & Plan Note (Signed)
BMI 45.60.  Recommended eating smaller high protein, low fat meals more frequently and exercising 30 mins a day 5 times a week with a goal of 10-15lb weight loss in the next 3 months. Patient voiced their understanding and motivation to adhere to these recommendations.

## 2023-09-16 NOTE — Assessment & Plan Note (Signed)
Noted on past labs, wishes not to start statin therapy.  Recheck labs today and recommend treatment based on results.  Discussed with him alternate option to medication would be obtaining CT coronary calcium score, although would need to pay out of pocket for this.  He would prefer this if ongoing LDL elevations. The 10-year ASCVD risk score (Arnett DK, et al., 2019) is: 11.1%   Values used to calculate the score:     Age: 67 years     Sex: Male     Is Non-Hispanic African American: No     Diabetic: No     Tobacco smoker: No     Systolic Blood Pressure: 110 mmHg     Is BP treated: Yes     HDL Cholesterol: 55 mg/dL     Total Cholesterol: 184 mg/dL

## 2023-09-16 NOTE — Assessment & Plan Note (Signed)
Noted on past labs, is diet focused. Recent A1c slight elevation at 5.8%.  Recheck today.

## 2023-09-16 NOTE — Progress Notes (Addendum)
BP 110/65   Pulse 69   Temp 98.3 F (36.8 C) (Oral)   Ht 5' 10.7" (1.796 m)   Wt (!) 324 lb 3.2 oz (147.1 kg)   SpO2 98%   BMI 45.60 kg/m    Subjective:    Patient ID: Jose Liu, male    DOB: 04/06/1956, 67 y.o.   MRN: [id number]  HPI: Jose Liu is a 67 y.o. male  Chief Complaint  Patient presents with   Asthma   Gastroesophageal Reflux   Hyperlipidemia   Hypertension   HYPERTENSION & HLD Taking Lisinopril-HCTZ 10-12.5 MG one tablet daily. Does not wish to take cholesterol medication. Hypertension status: stable  Satisfied with current treatment? yes Duration of hypertension: chronic BP monitoring frequency: not checking BP range:  BP medication side effects:  no Medication compliance: good compliance Previous BP meds: Losartan-HCTZ Aspirin: no Recurrent headaches: no Visual changes: no Palpitations: no Dyspnea: baseline with asthma Chest pain: no Lower extremity edema: mild if on feet a lot Dizzy/lightheaded: no  The 10-year ASCVD risk score (Arnett DK, et al., 2019) is: 11.1%   Values used to calculate the score:     Age: 60 years     Sex: Male     Is Non-Hispanic African American: No     Diabetic: No     Tobacco smoker: No     Systolic Blood Pressure: 110 mmHg     Is BP treated: Yes     HDL Cholesterol: 55 mg/dL     Total Cholesterol: 184 mg/dL  GERD Taking Protonix 40 MG daily and Famotidine 40 MG daily.  GERD control status: stable  Satisfied with current treatment? yes Heartburn frequency: none Medication side effects: no  Medication compliance: stable Previous GERD medications: Antacid use frequency:  none Dysphagia: no Odynophagia:  no Hematemesis: no Blood in stool: no EGD: no    ASTHMA/ALLERGIC RHINITIS Follows with pulmonary, Dr. Delton Coombes, and last saw on 10/18/22 -- continues Xolair with benefit.  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  Medications include Singulair, Claritin, and Flonase.  Refuses flu and pneumococcal vaccines.  Has OSA  and uses CPAP 100% of the time. Asthma status: stable Satisfied with current treatment?: yes Albuterol/rescue inhaler frequency: once a month use, if around cats Dyspnea frequency: at baseline Wheezing frequency: rarely Cough frequency: none Nocturnal symptom frequency: none Limitation of activity: no Current upper respiratory symptoms: no Triggers: cats and dogs, various allergies Home peak flows: none Last Spirometry: with pulmonary Failed/intolerant to following asthma meds: none Asthma meds in past: Albuterol Aerochamber/spacer use: no Visits to ER or Urgent Care in past year: no Pneumovax: refuses Influenza: refuses, make him sick     03/13/2023    8:33 AM 03/12/2022    8:39 AM 03/01/2021   11:09 AM 03/01/2021   10:37 AM 11/24/2020   11:03 AM  Depression screen PHQ 2/9  Decreased Interest 0 0 0 0 0  Down, Depressed, Hopeless 0 0 0 0 0  PHQ - 2 Score 0 0 0 0 0  Altered sleeping 0 2 0  0  Tired, decreased energy 0 1 0  0  Change in appetite 0 0 0  0  Feeling bad or failure about yourself  0 0 0  0  Trouble concentrating 0 0 0  0  Moving slowly or fidgety/restless 0 0 0  0  Suicidal thoughts 0 0 0  0  PHQ-9 Score 0 3 0  0  Difficult doing work/chores Not difficult at all  Not difficult at all         03/13/2023    8:33 AM 03/12/2022    8:39 AM  GAD 7 : Generalized Anxiety Score  Nervous, Anxious, on Edge 0 0  Control/stop worrying 0 0  Worry too much - different things 0 0  Trouble relaxing 0 0  Restless 0 0  Easily annoyed or irritable 0 0  Afraid - awful might happen 0 0  Total GAD 7 Score 0 0  Anxiety Difficulty Not difficult at all Not difficult at all       Relevant past medical, surgical, family and social history reviewed and updated as indicated. Interim medical history since our last visit reviewed. Allergies and medications reviewed and updated.  Review of Systems  Constitutional:  Negative for activity change, diaphoresis, fatigue and fever.   Respiratory:  Negative for cough, chest tightness, shortness of breath and wheezing.   Cardiovascular:  Negative for chest pain, palpitations and leg swelling.  Gastrointestinal: Negative.   Endocrine: Negative.   Neurological: Negative.   Psychiatric/Behavioral: Negative.     Per HPI unless specifically indicated above     Objective:    BP 110/65   Pulse 69   Temp 98.3 F (36.8 C) (Oral)   Ht 5' 10.7" (1.796 m)   Wt (!) 324 lb 3.2 oz (147.1 kg)   SpO2 98%   BMI 45.60 kg/m   Wt Readings from Last 3 Encounters:  09/16/23 (!) 324 lb 3.2 oz (147.1 kg)  08/22/23 (!) 326 lb (147.9 kg)  07/25/23 (!) 329 lb 9.6 oz (149.5 kg)    Physical Exam Vitals and nursing note reviewed.  Constitutional:      General: He is awake. He is not in acute distress.    Appearance: He is well-developed and well-groomed. He is morbidly obese. He is not ill-appearing or toxic-appearing.  HENT:     Head: Normocephalic and atraumatic.     Right Ear: Hearing, tympanic membrane, ear canal and external ear normal. No drainage.     Left Ear: Hearing, tympanic membrane, ear canal and external ear normal. No drainage.  Eyes:     General: Lids are normal.        Right eye: No discharge.        Left eye: No discharge.     Conjunctiva/sclera: Conjunctivae normal.     Pupils: Pupils are equal, round, and reactive to light.  Neck:     Thyroid: No thyromegaly.     Vascular: No carotid bruit.     Trachea: Trachea normal.  Cardiovascular:     Rate and Rhythm: Normal rate and regular rhythm.     Heart sounds: Normal heart sounds, S1 normal and S2 normal. No murmur heard.    No gallop.  Pulmonary:     Effort: Pulmonary effort is normal. No accessory muscle usage or respiratory distress.     Breath sounds: Normal breath sounds.  Abdominal:     General: Bowel sounds are normal.     Palpations: Abdomen is soft.  Musculoskeletal:        General: Normal range of motion.     Cervical back: Normal range of  motion and neck supple.     Right lower leg: No edema.     Left lower leg: No edema.  Skin:    General: Skin is warm and dry.     Capillary Refill: Capillary refill takes less than 2 seconds.  Neurological:     Mental Status:  He is alert and oriented to person, place, and time.     Deep Tendon Reflexes: Reflexes are normal and symmetric.     Reflex Scores:      Brachioradialis reflexes are 2+ on the right side and 2+ on the left side.      Patellar reflexes are 2+ on the right side and 2+ on the left side. Psychiatric:        Attention and Perception: Attention normal.        Mood and Affect: Mood normal.        Speech: Speech normal.        Behavior: Behavior normal. Behavior is cooperative.        Thought Content: Thought content normal.    Results for orders placed or performed in visit on 03/13/23  CBC with Differential/Platelet  Result Value Ref Range   WBC 6.1 3.4 - 10.8 x10E3/uL   RBC 5.08 4.14 - 5.80 x10E6/uL   Hemoglobin 15.6 13.0 - 17.7 g/dL   Hematocrit 10.2 72.5 - 51.0 %   MCV 91 79 - 97 fL   MCH 30.7 26.6 - 33.0 pg   MCHC 33.9 31.5 - 35.7 g/dL   RDW 36.6 44.0 - 34.7 %   Platelets 245 150 - 450 x10E3/uL   Neutrophils 63 Not Estab. %   Lymphs 21 Not Estab. %   Monocytes 12 Not Estab. %   Eos 3 Not Estab. %   Basos 1 Not Estab. %   Neutrophils Absolute 3.9 1.4 - 7.0 x10E3/uL   Lymphocytes Absolute 1.3 0.7 - 3.1 x10E3/uL   Monocytes Absolute 0.7 0.1 - 0.9 x10E3/uL   EOS (ABSOLUTE) 0.2 0.0 - 0.4 x10E3/uL   Basophils Absolute 0.1 0.0 - 0.2 x10E3/uL   Immature Granulocytes 0 Not Estab. %   Immature Grans (Abs) 0.0 0.0 - 0.1 x10E3/uL  Comprehensive metabolic panel  Result Value Ref Range   Glucose 103 (H) 70 - 99 mg/dL   BUN 30 (H) 8 - 27 mg/dL   Creatinine, Ser 4.25 0.76 - 1.27 mg/dL   eGFR 90 >95 GL/OVF/6.43   BUN/Creatinine Ratio 32 (H) 10 - 24   Sodium 141 134 - 144 mmol/L   Potassium 4.3 3.5 - 5.2 mmol/L   Chloride 101 96 - 106 mmol/L   CO2 25 20 - 29  mmol/L   Calcium 9.7 8.6 - 10.2 mg/dL   Total Protein 6.6 6.0 - 8.5 g/dL   Albumin 4.5 3.9 - 4.9 g/dL   Globulin, Total 2.1 1.5 - 4.5 g/dL   Albumin/Globulin Ratio 2.1 1.2 - 2.2   Bilirubin Total 0.4 0.0 - 1.2 mg/dL   Alkaline Phosphatase 69 44 - 121 IU/L   AST 13 0 - 40 IU/L   ALT 19 0 - 44 IU/L  Lipid Panel w/o Chol/HDL Ratio  Result Value Ref Range   Cholesterol, Total 184 100 - 199 mg/dL   Triglycerides 91 0 - 149 mg/dL   HDL 55 >32 mg/dL   VLDL Cholesterol Cal 17 5 - 40 mg/dL   LDL Chol Calc (NIH) 951 (H) 0 - 99 mg/dL  Magnesium  Result Value Ref Range   Magnesium 2.2 1.6 - 2.3 mg/dL  TSH  Result Value Ref Range   TSH 0.771 0.450 - 4.500 uIU/mL  PSA  Result Value Ref Range   Prostate Specific Ag, Serum 1.4 0.0 - 4.0 ng/mL  HgB A1c  Result Value Ref Range   Hgb A1c MFr Bld 5.8 (H) 4.8 -  5.6 %   Est. average glucose Bld gHb Est-mCnc 120 mg/dL      Assessment & Plan:   Problem List Items Addressed This Visit       Cardiovascular and Mediastinum   Aortic atherosclerosis (HCC)    Chronic, stable. Noted on CT abdomen on 03/02/21.  Recommend statin use + daily ASA for prevention.      Essential hypertension    Chronic, stable.  BP at goal in office today.  Recommend he monitor BP at least a few mornings a week at home and document.  DASH diet at home.  Continue current medication regimen and adjust as needed.  Labs today: CMP.  Return in 6 months.         Respiratory   Moderate persistent asthma    Chronic, stable.  No recent exacerbations.  Followed by pulmonary and allergist. Continue current medication regimen and adjust as needed, minimal use at this time of Albuterol.  Continue collaboration with pulmonary and review notes.  Is on ACE, if cough or refractory symptoms present then will determine alternate option for BP, such as Amlodipine or Losartan. Return in 6 months.        OSA (obstructive sleep apnea)    Chronic, stable with good compliance to CPAP.   Continue to utilize daily.        Digestive   GERD (gastroesophageal reflux disease)    Chronic, stable.  Continue current medication regimen and adjust as needed.  Consider referral to GI if worsening symptoms.  Reports history of multiple EGD.  Is on Pepcid and PPI, this is long standing and also benefits his asthma, as in past reports GERD symptoms exacerbated asthma symptoms.  Mag level up to date.        Other   Class 3 severe obesity with body mass index (BMI) of 45.0 to 49.9 in adult (HCC) - Primary    BMI 45.60.  Recommended eating smaller high protein, low fat meals more frequently and exercising 30 mins a day 5 times a week with a goal of 10-15lb weight loss in the next 3 months. Patient voiced their understanding and motivation to adhere to these recommendations.       Elevated hemoglobin A1c measurement    Noted on past labs, is diet focused. Recent A1c slight elevation at 5.8%.  Recheck today.      Relevant Orders   HgB A1c   Elevated low density lipoprotein (LDL) cholesterol level    Noted on past labs, wishes not to start statin therapy.  Recheck labs today and recommend treatment based on results.  Discussed with him alternate option to medication would be obtaining CT coronary calcium score, although would need to pay out of pocket for this.  He would prefer this if ongoing LDL elevations. The 10-year ASCVD risk score (Arnett DK, et al., 2019) is: 11.1%   Values used to calculate the score:     Age: 31 years     Sex: Male     Is Non-Hispanic African American: No     Diabetic: No     Tobacco smoker: No     Systolic Blood Pressure: 110 mmHg     Is BP treated: Yes     HDL Cholesterol: 55 mg/dL     Total Cholesterol: 184 mg/dL       Relevant Orders   Comprehensive metabolic panel   Lipid Panel w/o Chol/HDL Ratio     Follow up plan: Return in about 6 months (  around 03/15/2024) for Annual Physical after 03/12/24 .

## 2023-09-17 LAB — HEMOGLOBIN A1C
Est. average glucose Bld gHb Est-mCnc: 120 mg/dL
Hgb A1c MFr Bld: 5.8 % — ABNORMAL HIGH (ref 4.8–5.6)

## 2023-09-17 LAB — COMPREHENSIVE METABOLIC PANEL
ALT: 18 [IU]/L (ref 0–44)
AST: 19 [IU]/L (ref 0–40)
Albumin: 4.4 g/dL (ref 3.9–4.9)
Alkaline Phosphatase: 68 [IU]/L (ref 44–121)
BUN/Creatinine Ratio: 28 — ABNORMAL HIGH (ref 10–24)
BUN: 26 mg/dL (ref 8–27)
Bilirubin Total: 0.5 mg/dL (ref 0.0–1.2)
CO2: 24 mmol/L (ref 20–29)
Calcium: 9.5 mg/dL (ref 8.6–10.2)
Chloride: 101 mmol/L (ref 96–106)
Creatinine, Ser: 0.94 mg/dL (ref 0.76–1.27)
Globulin, Total: 2.1 g/dL (ref 1.5–4.5)
Glucose: 103 mg/dL — ABNORMAL HIGH (ref 70–99)
Potassium: 4.2 mmol/L (ref 3.5–5.2)
Sodium: 141 mmol/L (ref 134–144)
Total Protein: 6.5 g/dL (ref 6.0–8.5)
eGFR: 89 mL/min/{1.73_m2} (ref >59)

## 2023-09-17 LAB — LIPID PANEL W/O CHOL/HDL RATIO
Cholesterol, Total: 177 mg/dL (ref 100–199)
HDL: 53 mg/dL (ref >39)
LDL Chol Calc (NIH): 107 mg/dL — ABNORMAL HIGH (ref 0–99)
Triglycerides: 94 mg/dL (ref 0–149)
VLDL Cholesterol Cal: 17 mg/dL (ref 5–40)

## 2023-09-17 NOTE — Addendum Note (Signed)
Addended by: Aura Dials T on: 09/17/2023 06:36 PM   Modules accepted: Orders

## 2023-09-17 NOTE — Progress Notes (Signed)
Contacted via MyChart   Good evening Okechukwu, your labs have returned: - A1c is showing ongoing prediabetes, but no change from previous level.  The A1c looks at your blood sugars over the past 3 months and turns the average into a number.  Your number is 5.8% and any number 6.5 or greater is considered diabetes, prediabetes is 5.7 to 6.4%.  I would recommend heavy focus on decreasing foods high in sugar and your intake of things like bread products, pasta, and rice.  The American Diabetes Association online has a large amount of information on diet changes to make.  - Kidney function, creatinine and eGFR, remains normal, as is liver function, AST and ALT.  The BUN/creatinine ratio is a little elevated, which can be related to a little dehydration with fasting. - LDL, bad cholesterol, remains elevated, but have come down.  I will order the CT we discussed and they will call to schedule this with you.  Any questions? Keep being amazing!!  Thank you for allowing me to participate in your care.  I appreciate you. Kindest regards, Desi Carby

## 2023-09-19 ENCOUNTER — Ambulatory Visit: Payer: MEDICARE

## 2023-09-19 VITALS — BP 151/75 | HR 80 | Temp 98.3°F | Resp 12 | Ht 71.0 in | Wt 323.8 lb

## 2023-09-19 DIAGNOSIS — J4540 Moderate persistent asthma, uncomplicated: Secondary | ICD-10-CM

## 2023-09-19 MED ORDER — OMALIZUMAB 150 MG/ML ~~LOC~~ SOSY
300.0000 mg | PREFILLED_SYRINGE | Freq: Once | SUBCUTANEOUS | Status: AC
Start: 2023-09-19 — End: 2023-09-19
  Administered 2023-09-19: 300 mg via SUBCUTANEOUS

## 2023-09-19 NOTE — Progress Notes (Signed)
Diagnosis: Asthma  Provider:  Chilton Greathouse MD  Procedure: Injection  Xolair (Omalizumab), Dose: 300 mg, Site: subcutaneous, Number of injections: 2  Administered in bilateral arms.  Post Care: Patient declined observation  Discharge: Condition: Good, Destination: Home . AVS Declined  Performed by:  Wyvonne Lenz, RN

## 2023-09-20 ENCOUNTER — Ambulatory Visit
Admission: RE | Admit: 2023-09-20 | Discharge: 2023-09-20 | Disposition: A | Discharge status: Home / Self Care | Payer: MEDICARE | Source: Ambulatory Visit | Attending: Nurse Practitioner | Admitting: Nurse Practitioner

## 2023-09-20 DIAGNOSIS — Z6841 Body Mass Index (BMI) 40.0 and over, adult: Secondary | ICD-10-CM | POA: Insufficient documentation

## 2023-09-20 DIAGNOSIS — E7800 Pure hypercholesterolemia, unspecified: Secondary | ICD-10-CM | POA: Insufficient documentation

## 2023-09-20 DIAGNOSIS — I700 Atherosclerosis of aorta: Secondary | ICD-10-CM | POA: Insufficient documentation

## 2023-09-20 DIAGNOSIS — E66813 Obesity, class 3: Secondary | ICD-10-CM | POA: Insufficient documentation

## 2023-09-20 DIAGNOSIS — I10 Essential (primary) hypertension: Secondary | ICD-10-CM | POA: Insufficient documentation

## 2023-09-20 NOTE — Progress Notes (Signed)
Contacted via MyChart   Good news Jose Liu, your scoring  returned at 28.5, which means there is mild plaque build up. This means at present we do not need to start medication.  You can work on healthy diet changes and regular exercise to prevent significant plaque build up in future.  No medication needed at this time.:)

## 2023-09-24 ENCOUNTER — Encounter: Payer: Self-pay | Admitting: Pulmonary Disease

## 2023-10-17 ENCOUNTER — Ambulatory Visit: Payer: MEDICARE

## 2023-10-17 VITALS — BP 131/75 | HR 85 | Temp 98.0°F | Resp 16 | Ht 71.0 in | Wt 325.4 lb

## 2023-10-17 DIAGNOSIS — J4540 Moderate persistent asthma, uncomplicated: Secondary | ICD-10-CM

## 2023-10-17 MED ORDER — OMALIZUMAB 150 MG/ML ~~LOC~~ SOSY
300.0000 mg | PREFILLED_SYRINGE | Freq: Once | SUBCUTANEOUS | Status: AC
  Administered 2023-10-17: 300 mg via SUBCUTANEOUS
  Filled 2023-10-17: qty 2

## 2023-10-17 NOTE — Progress Notes (Signed)
Diagnosis: Asthma  Provider:  Chilton Greathouse MD  Procedure: Injection  Xolair (Omalizumab), Dose: 300 mg, Site: subcutaneous, Number of injections: 2  Injection Site(s): Left arm and Right arm  Post Care:  left and right arm injections  Discharge: Condition: Good, Destination: Home . AVS Declined  Performed by:  Rico Ala, LPN

## 2023-11-14 ENCOUNTER — Ambulatory Visit: Payer: MEDICARE

## 2023-11-14 VITALS — BP 125/67 | HR 89 | Temp 98.2°F | Resp 16 | Ht 71.0 in | Wt 331.4 lb

## 2023-11-14 DIAGNOSIS — J4540 Moderate persistent asthma, uncomplicated: Secondary | ICD-10-CM

## 2023-11-14 MED ORDER — OMALIZUMAB 150 MG/ML ~~LOC~~ SOSY
300.0000 mg | PREFILLED_SYRINGE | Freq: Once | SUBCUTANEOUS | Status: AC
Start: 2023-11-14 — End: 2023-11-14
  Administered 2023-11-14: 300 mg via SUBCUTANEOUS
  Filled 2023-11-14: qty 2

## 2023-11-14 NOTE — Progress Notes (Signed)
Diagnosis: Asthma  Provider:  Chilton Greathouse MD  Procedure: Injection  Xolair (Omalizumab), Dose: 300 mg, Site: subcutaneous, Number of injections: 2  Injection Site(s): Left arm and Right arm  Post Care:  N/A  Discharge: Condition: Good, Destination: Home . AVS Declined  Performed by:  Nat Math, RN

## 2023-12-04 ENCOUNTER — Encounter: Payer: Self-pay | Admitting: Pulmonary Disease

## 2023-12-12 ENCOUNTER — Ambulatory Visit: Payer: MEDICARE | Admitting: *Deleted

## 2023-12-12 ENCOUNTER — Encounter: Payer: Self-pay | Admitting: Pulmonary Disease

## 2023-12-12 VITALS — BP 133/66 | HR 89 | Temp 98.6°F | Resp 16 | Ht 71.0 in | Wt 333.8 lb

## 2023-12-12 DIAGNOSIS — J4540 Moderate persistent asthma, uncomplicated: Secondary | ICD-10-CM

## 2023-12-12 MED ORDER — OMALIZUMAB 150 MG/ML ~~LOC~~ SOSY
300.0000 mg | PREFILLED_SYRINGE | Freq: Once | SUBCUTANEOUS | Status: AC
  Administered 2023-12-12: 300 mg via SUBCUTANEOUS

## 2023-12-12 NOTE — Progress Notes (Signed)
Diagnosis: Asthma  Provider:  Chilton Greathouse MD  Procedure: Injection  Xolair (Omalizumab), Dose: 300 mg, Site: subcutaneous, Number of injections: 2  Injection Site(s): Right arm  Post Care: Observation period completed  Discharge: Condition: Good, Destination: Home . AVS Declined  Performed by:  Forrest Moron, RN

## 2023-12-20 ENCOUNTER — Telehealth: Payer: Self-pay | Admitting: Pharmacy Technician

## 2023-12-20 NOTE — Telephone Encounter (Signed)
Auth Submission: NO AUTH NEEDED Site of care: Site of care: [facility] Payer: MEDICARE RAILROAD Medication & CPT/J Code(s) submitted: Xolair (Omalizumab) [id number] Route of submission (phone, fax, portal):  Phone # Fax # Auth type: Buy/Bill PB Units/visits requested: 300MG  Q4WKS Reference number:  Approval from: 12/20/23 to 11/28/24

## 2024-01-09 ENCOUNTER — Ambulatory Visit: Payer: MEDICARE

## 2024-01-09 VITALS — BP 127/66 | HR 94 | Temp 98.6°F | Resp 16 | Ht 71.0 in | Wt 330.2 lb

## 2024-01-09 DIAGNOSIS — J4540 Moderate persistent asthma, uncomplicated: Secondary | ICD-10-CM

## 2024-01-09 MED ORDER — OMALIZUMAB 150 MG/ML ~~LOC~~ SOSY
300.0000 mg | PREFILLED_SYRINGE | Freq: Once | SUBCUTANEOUS | Status: AC
  Administered 2024-01-09: 300 mg via SUBCUTANEOUS
  Filled 2024-01-09: qty 2

## 2024-01-09 NOTE — Progress Notes (Signed)
 Diagnosis: Asthma  Provider:  Chilton Greathouse MD  Procedure: Injection  Xolair (Omalizumab), Dose: 300 mg, Site: subcutaneous, Number of injections: 2  Injection Site(s): Left arm and Right arm  Post Care: Patient declined observation  Discharge: Condition: Stable, Destination: Home . AVS Declined  Performed by:  Wyvonne Lenz, RN

## 2024-02-06 ENCOUNTER — Ambulatory Visit (INDEPENDENT_AMBULATORY_CARE_PROVIDER_SITE_OTHER): Payer: MEDICARE | Admitting: *Deleted

## 2024-02-06 VITALS — BP 114/68 | HR 93 | Temp 98.8°F | Ht 71.0 in | Wt 329.4 lb

## 2024-02-06 DIAGNOSIS — J4540 Moderate persistent asthma, uncomplicated: Secondary | ICD-10-CM

## 2024-02-06 MED ORDER — OMALIZUMAB 150 MG/ML ~~LOC~~ SOSY
300.0000 mg | PREFILLED_SYRINGE | Freq: Once | SUBCUTANEOUS | Status: AC
  Administered 2024-02-06: 300 mg via SUBCUTANEOUS
  Filled 2024-02-06: qty 2

## 2024-02-06 NOTE — Progress Notes (Signed)
 Diagnosis: Asthma  Provider:  Chilton Greathouse MD  Procedure: Injection  Xolair (Omalizumab), Dose: 300 mg, Site: subcutaneous, Number of injections: 2  Injection Site(s): Right arm  Post Care: Observation period completed  Discharge: Condition: Good, Destination: Home . AVS Declined  Performed by:  Forrest Moron, RN

## 2024-02-19 ENCOUNTER — Encounter: Payer: Self-pay | Admitting: Nurse Practitioner

## 2024-02-20 ENCOUNTER — Telehealth: Payer: Self-pay

## 2024-02-20 ENCOUNTER — Other Ambulatory Visit: Payer: Self-pay | Admitting: Emergency Medicine

## 2024-02-20 MED ORDER — ALBUTEROL SULFATE HFA 108 (90 BASE) MCG/ACT IN AERS: INHALATION_SPRAY | RESPIRATORY_TRACT | 5 refills | Status: AC

## 2024-02-20 MED ORDER — BUDESONIDE 0.25 MG/2ML IN SUSP: 0.2500 mg | Freq: Two times a day (BID) | RESPIRATORY_TRACT | 0 refills | Status: DC

## 2024-02-20 NOTE — Telephone Encounter (Signed)
 Copied from CRM [id number]. Topic: Clinical - Medication Refill >> Feb 20, 2024  9:33 AM Margarette Shawl wrote: Most Recent Primary Care Visit:  Provider: CANNADY, JOLENE T  Department: [id number]  Visit Type: OFFICE VISIT  Date: 09/16/2023  Medication: budesonide  (PULMICORT ) 0.25 MG/2ML nebulizer solution   Has the patient contacted their pharmacy? No (Agent: If no, request that the patient contact the pharmacy for the refill. If patient does not wish to contact the pharmacy document the reason why and proceed with request.) (Agent: If yes, when and what did the pharmacy advise?)  Is this the correct pharmacy for this prescription? Yes If no, delete pharmacy and type the correct one.  This is the patient's preferred pharmacy:   [id number] [place], [place] - [address] AT [facility] OF [address] [place] [address] Phone: [phone] Fax: [phone]   Has the prescription been filled recently? No  Is the patient out of the medication? Yes  Has the patient been seen for an appointment in the last year OR does the patient have an upcoming appointment? Yes  Can we respond through MyChart? Yes  Agent: Please be advised that Rx refills may take up to 3 business days. We ask that you follow-up with your pharmacy.

## 2024-02-20 NOTE — Telephone Encounter (Signed)
 Last Fill: 01/29/22  Last OV: 10/18/22 Next OV: 04/17/24  Routing to provider for review/authorization.

## 2024-02-20 NOTE — Telephone Encounter (Signed)
 Pharmacy Patient Advocate Encounter   Received notification from CoverMyMeds that prior authorization for Budesonide  0.25MG /2ML suspension  is required/requested.   Insurance verification completed.   The patient is insured through [facility] .   Per test claim: PA required; However, NEW/RECENT labs/notes are needed to complete & submit PA request. Please see below.  CMM Key: [id number]  Patient has not been seen since 09/2022

## 2024-02-21 ENCOUNTER — Telehealth: Payer: Self-pay | Admitting: Emergency Medicine

## 2024-02-21 ENCOUNTER — Other Ambulatory Visit (HOSPITAL_COMMUNITY): Payer: Self-pay

## 2024-02-21 ENCOUNTER — Encounter: Payer: Self-pay | Admitting: Pulmonary Disease

## 2024-02-21 ENCOUNTER — Telehealth: Payer: Self-pay

## 2024-02-21 MED ORDER — BUDESONIDE 0.25 MG/2ML IN SUSP: 0.2500 mg | Freq: Two times a day (BID) | RESPIRATORY_TRACT | 0 refills | Status: DC

## 2024-02-21 NOTE — Telephone Encounter (Signed)
 Pharmacy Patient Advocate Encounter  Received notification from [facility] that Prior Authorization for Budesonide  0.25MG /2ML suspension  has been CANCELLED due to medication must be processed under Medicare Part B   PA #/Case ID/Reference #: [id number]

## 2024-02-21 NOTE — Telephone Encounter (Signed)
 [facility] @ [address] in [place].   PT needs Budesonide . Has made a FU appt. Would like a 3 mo supply he says. His # is [phone]. Please try to fill before weekend. TY.

## 2024-02-21 NOTE — Telephone Encounter (Signed)
 Routing to front staff to schedule pt appt so meds can be renewed. Please advise

## 2024-02-21 NOTE — Telephone Encounter (Signed)
 Spoke with pt. Pt scheduled ov 04/17/24. Refill sent to pharmacy

## 2024-02-24 MED ORDER — EPINEPHRINE 0.3 MG/0.3ML IJ SOAJ: 0.3000 mg | INTRAMUSCULAR | 3 refills | Status: AC | PRN

## 2024-02-24 NOTE — Addendum Note (Signed)
 Addended by: Kaeya Schiffer T on: 02/24/2024 12:06 PM   Modules accepted: Orders

## 2024-02-26 ENCOUNTER — Other Ambulatory Visit: Payer: Self-pay | Admitting: Emergency Medicine

## 2024-02-28 ENCOUNTER — Encounter: Payer: Self-pay | Admitting: Pulmonary Disease

## 2024-03-05 ENCOUNTER — Ambulatory Visit (INDEPENDENT_AMBULATORY_CARE_PROVIDER_SITE_OTHER): Payer: MEDICARE

## 2024-03-05 VITALS — BP 149/83 | HR 84 | Temp 98.5°F | Resp 18 | Ht 71.0 in | Wt 330.0 lb

## 2024-03-05 DIAGNOSIS — J4540 Moderate persistent asthma, uncomplicated: Secondary | ICD-10-CM

## 2024-03-05 MED ORDER — OMALIZUMAB 150 MG/ML ~~LOC~~ SOSY
300.0000 mg | PREFILLED_SYRINGE | Freq: Once | SUBCUTANEOUS | Status: AC
  Administered 2024-03-05: 300 mg via SUBCUTANEOUS

## 2024-03-05 NOTE — Progress Notes (Signed)
 Diagnosis: Asthma  Provider:  Mannam, Praveen MD  Procedure: Injection  Xolair  (Omalizumab ), Dose: 300 mg, Site: subcutaneous, Number of injections: 2  Injection Site(s): Left arm  Post Care: Patient declined observation  Discharge: Condition: Good, Destination: Home . AVS Provided  Performed by:  Natividad Balding, RN

## 2024-03-15 NOTE — Patient Instructions (Signed)
 Be Involved in Caring For Your Health:  Taking Medications When medications are taken as directed, they can greatly improve your health. But if they are not taken as prescribed, they may not work. In some cases, not taking them correctly can be harmful. To help ensure your treatment remains effective and safe, understand your medications and how to take them. Bring your medications to each visit for review by your provider.  Your lab results, notes, and after visit summary will be available on My Chart. We strongly encourage you to use this feature. If lab results are abnormal the clinic will contact you with the appropriate steps. If the clinic does not contact you assume the results are satisfactory. You can always view your results on My Chart. If you have questions regarding your health or results, please contact the clinic during office hours. You can also ask questions on My Chart.  We at [facility] are grateful that you chose [place] to provide your care. We strive to provide evidence-based and compassionate care and are always looking for feedback. If you get a survey from the clinic please complete this so we can hear your opinions.  Heart-Healthy Eating Plan Many factors influence your heart health, including eating and exercise habits. Heart health is also called coronary health. Coronary risk increases with abnormal blood fat (lipid) levels. A heart-healthy eating plan includes limiting unhealthy fats, increasing healthy fats, limiting salt (sodium) intake, and making other diet and lifestyle changes. What is my plan? Your health care provider may recommend that: You limit your fat intake to _________% or less of your total calories each day. You limit your saturated fat intake to _________% or less of your total calories each day. You limit the amount of cholesterol in your diet to less than _________ mg per day. You limit the amount of sodium in your diet to less than _________  mg per day. What are tips for following this plan? Cooking Cook foods using methods other than frying. Baking, boiling, grilling, and broiling are all good options. Other ways to reduce fat include: Removing the skin from poultry. Removing all visible fats from meats. Steaming vegetables in water or broth. Meal planning  At meals, imagine dividing your plate into fourths: Fill one-half of your plate with vegetables and green salads. Fill one-fourth of your plate with whole grains. Fill one-fourth of your plate with lean protein foods. Eat 2-4 cups of vegetables per day. One cup of vegetables equals 1 cup (91 g) broccoli or cauliflower florets, 2 medium carrots, 1 large bell pepper, 1 large sweet potato, 1 large tomato, 1 medium white potato, 2 cups (150 g) raw leafy greens. Eat 1-2 cups of fruit per day. One cup of fruit equals 1 small apple, 1 large banana, 1 cup (237 g) mixed fruit, 1 large orange,  cup (82 g) dried fruit, 1 cup (240 mL) 100% fruit juice. Eat more foods that contain soluble fiber. Examples include apples, broccoli, carrots, beans, peas, and barley. Aim to get 25-30 g of fiber per day. Increase your consumption of legumes, nuts, and seeds to 4-5 servings per week. One serving of dried beans or legumes equals  cup (90 g) cooked, 1 serving of nuts is  oz (12 almonds, 24 pistachios, or 7 walnut halves), and 1 serving of seeds equals  oz (8 g). Fats Choose healthy fats more often. Choose monounsaturated and polyunsaturated fats, such as olive and canola oils, avocado oil, flaxseeds, walnuts, almonds, and seeds. Eat  more omega-3 fats. Choose salmon, mackerel, sardines, tuna, flaxseed oil, and ground flaxseeds. Aim to eat fish at least 2 times each week. Check food labels carefully to identify foods with trans fats or high amounts of saturated fat. Limit saturated fats. These are found in animal products, such as meats, butter, and cream. Plant sources of saturated fats  include palm oil, palm kernel oil, and coconut oil. Avoid foods with partially hydrogenated oils in them. These contain trans fats. Examples are stick margarine, some tub margarines, cookies, crackers, and other baked goods. Avoid fried foods. General information Eat more home-cooked food and less restaurant, buffet, and fast food. Limit or avoid alcohol. Limit foods that are high in added sugar and simple starches such as foods made using white refined flour (white breads, pastries, sweets). Lose weight if you are overweight. Losing just 5-10% of your body weight can help your overall health and prevent diseases such as diabetes and heart disease. Monitor your sodium intake, especially if you have high blood pressure. Talk with your health care provider about your sodium intake. Try to incorporate more vegetarian meals weekly. What foods should I eat? Fruits All fresh, canned (in natural juice), or frozen fruits. Vegetables Fresh or frozen vegetables (raw, steamed, roasted, or grilled). Green salads. Grains Most grains. Choose whole wheat and whole grains most of the time. Rice and pasta, including brown rice and pastas made with whole wheat. Meats and other proteins Lean, well-trimmed beef, veal, pork, and lamb. Chicken and [place] without skin. All fish and shellfish. Wild duck, rabbit, pheasant, and venison. Egg whites or low-cholesterol egg substitutes. Dried beans, peas, lentils, and tofu. Seeds and most nuts. Dairy Low-fat or nonfat cheeses, including ricotta and mozzarella. Skim or 1% milk (liquid, powdered, or evaporated). Buttermilk made with low-fat milk. Nonfat or low-fat yogurt. Fats and oils Non-hydrogenated (trans-free) margarines. Vegetable oils, including soybean, sesame, sunflower, olive, avocado, peanut, safflower, corn, canola, and cottonseed. Salad dressings or mayonnaise made with a vegetable oil. Beverages Water (mineral or sparkling). Coffee and tea. Unsweetened ice  tea. Diet beverages. Sweets and desserts Sherbet, gelatin, and fruit ice. Small amounts of dark chocolate. Limit all sweets and desserts. Seasonings and condiments All seasonings and condiments. The items listed above may not be a complete list of foods and beverages you can eat. Contact a dietitian for more options. What foods should I avoid? Fruits Canned fruit in heavy syrup. Fruit in cream or butter sauce. Fried fruit. Limit coconut. Vegetables Vegetables cooked in cheese, cream, or butter sauce. Fried vegetables. Grains Breads made with saturated or trans fats, oils, or whole milk. Croissants. Sweet rolls. Donuts. High-fat crackers, such as cheese crackers and chips. Meats and other proteins Fatty meats, such as hot dogs, ribs, sausage, bacon, rib-eye roast or steak. High-fat deli meats, such as salami and bologna. Caviar. Domestic duck and goose. Organ meats, such as liver. Dairy Cream, sour cream, cream cheese, and creamed cottage cheese. Whole-milk cheeses. Whole or 2% milk (liquid, evaporated, or condensed). Whole buttermilk. Cream sauce or high-fat cheese sauce. Whole-milk yogurt. Fats and oils Meat fat, or shortening. Cocoa butter, hydrogenated oils, palm oil, coconut oil, palm kernel oil. Solid fats and shortenings, including bacon fat, salt pork, lard, and butter. Nondairy cream substitutes. Salad dressings with cheese or sour cream. Beverages Regular sodas and any drinks with added sugar. Sweets and desserts Frosting. Pudding. Cookies. Cakes. Pies. Milk chocolate or white chocolate. Buttered syrups. Full-fat ice cream or ice cream drinks. The items listed above may  not be a complete list of foods and beverages to avoid. Contact a dietitian for more information. Summary Heart-healthy meal planning includes limiting unhealthy fats, increasing healthy fats, limiting salt (sodium) intake and making other diet and lifestyle changes. Lose weight if you are overweight. Losing just  5-10% of your body weight can help your overall health and prevent diseases such as diabetes and heart disease. Focus on eating a balance of foods, including fruits and vegetables, low-fat or nonfat dairy, lean protein, nuts and legumes, whole grains, and heart-healthy oils and fats. This information is not intended to replace advice given to you by your health care provider. Make sure you discuss any questions you have with your health care provider. Document Revised: 11/20/2021 Document Reviewed: 11/20/2021 [employer]vier Patient Education  2024 [employer].

## 2024-03-18 ENCOUNTER — Encounter: Payer: Self-pay | Admitting: Nurse Practitioner

## 2024-03-18 ENCOUNTER — Ambulatory Visit (INDEPENDENT_AMBULATORY_CARE_PROVIDER_SITE_OTHER): Payer: MEDICARE | Admitting: Nurse Practitioner

## 2024-03-18 VITALS — BP 108/66 | HR 76 | Temp 98.2°F | Ht 70.6 in | Wt 328.6 lb

## 2024-03-18 DIAGNOSIS — R7309 Other abnormal glucose: Secondary | ICD-10-CM

## 2024-03-18 DIAGNOSIS — I700 Atherosclerosis of aorta: Secondary | ICD-10-CM

## 2024-03-18 DIAGNOSIS — I10 Essential (primary) hypertension: Secondary | ICD-10-CM

## 2024-03-18 DIAGNOSIS — E7800 Pure hypercholesterolemia, unspecified: Secondary | ICD-10-CM

## 2024-03-18 DIAGNOSIS — G4733 Obstructive sleep apnea (adult) (pediatric): Secondary | ICD-10-CM

## 2024-03-18 DIAGNOSIS — Z0000 Encounter for general adult medical examination without abnormal findings: Secondary | ICD-10-CM

## 2024-03-18 DIAGNOSIS — Z6841 Body Mass Index (BMI) 40.0 and over, adult: Secondary | ICD-10-CM

## 2024-03-18 DIAGNOSIS — Q893 Situs inversus: Secondary | ICD-10-CM

## 2024-03-18 DIAGNOSIS — J4540 Moderate persistent asthma, uncomplicated: Secondary | ICD-10-CM

## 2024-03-18 DIAGNOSIS — E66813 Obesity, class 3: Secondary | ICD-10-CM

## 2024-03-18 DIAGNOSIS — K219 Gastro-esophageal reflux disease without esophagitis: Secondary | ICD-10-CM

## 2024-03-18 DIAGNOSIS — N400 Enlarged prostate without lower urinary tract symptoms: Secondary | ICD-10-CM

## 2024-03-18 MED ORDER — MELOXICAM 15 MG PO TABS: 15.0000 mg | ORAL_TABLET | Freq: Every day | ORAL | 3 refills | Status: AC | PRN

## 2024-03-18 NOTE — Assessment & Plan Note (Signed)
Chronic, stable with good compliance to CPAP.  Continue to utilize daily. 

## 2024-03-18 NOTE — Assessment & Plan Note (Signed)
 BMI 46.35.  Recommended eating smaller high protein, low fat meals more frequently and exercising 30 mins a day 5 times a week with a goal of 10-15lb weight loss in the next 3 months. Patient voiced their understanding and motivation to adhere to these recommendations.

## 2024-03-18 NOTE — Assessment & Plan Note (Signed)
 Noted on past labs, is diet focused. Recent A1c 5.8%.  Recheck today.  Continue focus on diet and regular exercise.

## 2024-03-18 NOTE — Assessment & Plan Note (Signed)
 Chronic, stable.  No recent exacerbations.  Followed by pulmonary and allergist. Continue current medication regimen and adjust as needed.  Continue collaboration with pulmonary and review notes.  Is on ACE, if cough or refractory symptoms present then will determine alternate option for BP, such as Amlodipine or Losartan. Return in 6 months.
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
## 2024-03-18 NOTE — Assessment & Plan Note (Signed)
 Chronic, stable.  BP at goal in office today.  Recommend he monitor BP at least a few mornings a week at home and document.  DASH diet at home.  Continue current medication regimen and adjust as needed.  Labs today: CBC, TSH, CMP.  Return in 6 months.

## 2024-03-18 NOTE — Assessment & Plan Note (Signed)
 Chronic, stable.  Continue current medication regimen and adjust as needed.  Consider referral to GI if worsening symptoms.  Reports history of multiple EGD.  Is on Pepcid  and PPI, this is long standing and also benefits his asthma, as in past reports GERD symptoms exacerbated asthma symptoms.  Mag level today.

## 2024-03-18 NOTE — Assessment & Plan Note (Signed)
 Noted on past labs, wishes not to start statin therapy.  Recheck labs today.  CT Cardiac Scoring on 09/20/23 was 28.5 with recommendation to continue focus on heart healthy lifestyle and activity.  Plan to repeat this in November 2024. The 10-year ASCVD risk score (Arnett DK, et al., 2019) is: 10.7%   Values used to calculate the score:     Age: 68 years     Sex: Male     Is Non-Hispanic African American: No     Diabetic: No     Tobacco smoker: No     Systolic Blood Pressure: 108 mmHg     Is BP treated: Yes     HDL Cholesterol: 53 mg/dL     Total Cholesterol: 177 mg/dL

## 2024-03-18 NOTE — Assessment & Plan Note (Signed)
 Chronic, stable. Noted on CT abdomen on 03/02/21.  Recommend statin use + daily ASA for prevention.

## 2024-03-18 NOTE — Assessment & Plan Note (Signed)
Diagnosed several years ago per patient.  Continue to monitor and consider referral to ENT if worsening symptoms. 

## 2024-03-19 ENCOUNTER — Encounter: Payer: Self-pay | Admitting: Nurse Practitioner

## 2024-03-19 ENCOUNTER — Ambulatory Visit: Payer: Self-pay | Admitting: Nurse Practitioner

## 2024-03-19 DIAGNOSIS — Z8042 Family history of malignant neoplasm of prostate: Secondary | ICD-10-CM | POA: Insufficient documentation

## 2024-03-19 LAB — CBC WITH DIFFERENTIAL/PLATELET
Basophils Absolute: 0.1 10*3/uL (ref 0.0–0.2)
Basos: 1 %
EOS (ABSOLUTE): 0.2 10*3/uL (ref 0.0–0.4)
Eos: 3 %
Hematocrit: 46.1 % (ref 37.5–51.0)
Hemoglobin: 15.3 g/dL (ref 13.0–17.7)
Immature Grans (Abs): 0 10*3/uL (ref 0.0–0.1)
Immature Granulocytes: 1 %
Lymphocytes Absolute: 1.2 10*3/uL (ref 0.7–3.1)
Lymphs: 19 %
MCH: 30.1 pg (ref 26.6–33.0)
MCHC: 33.2 g/dL (ref 31.5–35.7)
MCV: 91 fL (ref 79–97)
Monocytes Absolute: 0.7 10*3/uL (ref 0.1–0.9)
Monocytes: 10 %
Neutrophils Absolute: 4.3 10*3/uL (ref 1.4–7.0)
Neutrophils: 66 %
Platelets: 250 10*3/uL (ref 150–450)
RBC: 5.08 x10E6/uL (ref 4.14–5.80)
RDW: 12.8 % (ref 11.6–15.4)
WBC: 6.6 10*3/uL (ref 3.4–10.8)

## 2024-03-19 LAB — COMPREHENSIVE METABOLIC PANEL WITH GFR
ALT: 21 IU/L (ref 0–44)
AST: 17 IU/L (ref 0–40)
Albumin: 4.4 g/dL (ref 3.9–4.9)
Alkaline Phosphatase: 76 IU/L (ref 44–121)
BUN/Creatinine Ratio: 39 — ABNORMAL HIGH (ref 10–24)
BUN: 32 mg/dL — ABNORMAL HIGH (ref 8–27)
Bilirubin Total: 0.4 mg/dL (ref 0.0–1.2)
CO2: 23 mmol/L (ref 20–29)
Calcium: 9.5 mg/dL (ref 8.6–10.2)
Chloride: 102 mmol/L (ref 96–106)
Creatinine, Ser: 0.82 mg/dL (ref 0.76–1.27)
Globulin, Total: 2.2 g/dL (ref 1.5–4.5)
Glucose: 106 mg/dL — ABNORMAL HIGH (ref 70–99)
Potassium: 4.4 mmol/L (ref 3.5–5.2)
Sodium: 139 mmol/L (ref 134–144)
Total Protein: 6.6 g/dL (ref 6.0–8.5)
eGFR: 97 mL/min/{1.73_m2} (ref >59)

## 2024-03-19 LAB — PSA: Prostate Specific Ag, Serum: 1.3 ng/mL (ref 0.0–4.0)

## 2024-03-19 LAB — MAGNESIUM: Magnesium: 2.3 mg/dL (ref 1.6–2.3)

## 2024-03-19 LAB — LIPID PANEL W/O CHOL/HDL RATIO
Cholesterol, Total: 180 mg/dL (ref 100–199)
HDL: 49 mg/dL (ref >39)
LDL Chol Calc (NIH): 116 mg/dL — ABNORMAL HIGH (ref 0–99)
Triglycerides: 79 mg/dL (ref 0–149)
VLDL Cholesterol Cal: 15 mg/dL (ref 5–40)

## 2024-03-19 LAB — TSH: TSH: 0.598 u[IU]/mL (ref 0.450–4.500)

## 2024-03-19 LAB — HEMOGLOBIN A1C
Est. average glucose Bld gHb Est-mCnc: 111 mg/dL
Hgb A1c MFr Bld: 5.5 % (ref 4.8–5.6)

## 2024-03-19 NOTE — Progress Notes (Signed)
 Contacted via MyChart   Good day Hamp, your labs have returned: - Kidney function, creatinine and eGFR, remains normal, as is liver function, AST and ALT.  - Lipid panel continues to show elevation in LDL, bad cholesterol, however your CT cardiac scoring was low in November 2024.  I feel we can continue focus on healthy diet and regular activity.  Would benefit repeat CT next November (2026). - Remainder of labs all look great!!  Wonderful news!!  Any questions? Keep being amazing!!  Thank you for allowing me to participate in your care.  I appreciate you. Kindest regards, Margurette Brener

## 2024-04-02 ENCOUNTER — Ambulatory Visit (INDEPENDENT_AMBULATORY_CARE_PROVIDER_SITE_OTHER): Payer: MEDICARE

## 2024-04-02 VITALS — BP 129/70 | HR 87 | Temp 98.6°F | Resp 12 | Ht 71.0 in | Wt 331.4 lb

## 2024-04-02 DIAGNOSIS — J4540 Moderate persistent asthma, uncomplicated: Secondary | ICD-10-CM | POA: Diagnosis not present

## 2024-04-02 MED ORDER — OMALIZUMAB 150 MG/ML ~~LOC~~ SOSY
300.0000 mg | PREFILLED_SYRINGE | Freq: Once | SUBCUTANEOUS | Status: AC
  Administered 2024-04-02: 300 mg via SUBCUTANEOUS
  Filled 2024-04-02: qty 2

## 2024-04-02 NOTE — Progress Notes (Signed)
 Diagnosis: Asthma  Provider:  Chilton Greathouse MD  Procedure: Injection  Xolair (Omalizumab), Dose: 300 mg, Site: subcutaneous, Number of injections: 2  Injection Site(s): Left arm and Right arm  Post Care: Patient declined observation  Discharge: Condition: Stable, Destination: Home . AVS Declined  Performed by:  Wyvonne Lenz, RN

## 2024-04-06 ENCOUNTER — Ambulatory Visit: Payer: MEDICARE

## 2024-04-07 ENCOUNTER — Ambulatory Visit: Payer: MEDICARE | Admitting: Nurse Practitioner

## 2024-04-14 ENCOUNTER — Ambulatory Visit: Payer: PRIVATE HEALTH INSURANCE | Admitting: Nurse Practitioner

## 2024-04-17 ENCOUNTER — Encounter: Payer: Self-pay | Admitting: Nurse Practitioner

## 2024-04-17 ENCOUNTER — Ambulatory Visit: Payer: MEDICARE | Admitting: Nurse Practitioner

## 2024-04-17 VITALS — BP 126/80 | HR 64 | Temp 98.4°F | Ht 71.0 in | Wt 333.0 lb

## 2024-04-17 DIAGNOSIS — J4540 Moderate persistent asthma, uncomplicated: Secondary | ICD-10-CM | POA: Diagnosis not present

## 2024-04-17 DIAGNOSIS — G4733 Obstructive sleep apnea (adult) (pediatric): Secondary | ICD-10-CM | POA: Diagnosis not present

## 2024-04-17 NOTE — Patient Instructions (Signed)
 Continue Albuterol  inhaler 2 puffs or 3 mL neb every 6 hours as needed for shortness of breath or wheezing. Notify if symptoms persist despite rescue inhaler/neb use.  Continue budesonide  2 mL Twice daily. Brush tongue and rinse mouth afterwards  Continue flonase  nasal spray 2 sprays each nostril daily Continue montelukast  1 tab daily Continue claritin 1 tab daily Continue Xolair  injections as scheduled  Continue to use CPAP every night, minimum of 4-6 hours a night.  Change equipment as directed. Wash your tubing with warm soap and water daily, hang to dry. Wash humidifier portion weekly. Use bottled, distilled water and change daily Be aware of reduced alertness and do not drive or operate heavy machinery if experiencing this or drowsiness.  Exercise encouraged, as tolerated. Healthy weight management discussed.  Avoid or decrease alcohol consumption and medications that make you more sleepy, if possible. Notify if persistent daytime sleepiness occurs even with consistent use of PAP therapy.  Follow up in 6 months with Dr. Baldwin Levee or Gina Lagos. If symptoms do not improve or worsen, please contact office for sooner follow up or seek emergency care.

## 2024-04-17 NOTE — Assessment & Plan Note (Signed)
 Severe OSA on CPAP. Excellent compliance and control. Receives benefit from use. Aware of risks of untreated sleep apnea. Safe driving practices reviewed. Healthy weight management encouraged.

## 2024-04-17 NOTE — Assessment & Plan Note (Signed)
 Well managed on biologic therapy with Xolair . No recent exacerbations or hospitalizations. Continue current regimen. Action plan in place. Trigger prevention reviewed.  Patient Instructions  Continue Albuterol  inhaler 2 puffs or 3 mL neb every 6 hours as needed for shortness of breath or wheezing. Notify if symptoms persist despite rescue inhaler/neb use.  Continue budesonide  2 mL Twice daily. Brush tongue and rinse mouth afterwards  Continue flonase  nasal spray 2 sprays each nostril daily Continue montelukast  1 tab daily Continue claritin 1 tab daily Continue Xolair  injections as scheduled  Continue to use CPAP every night, minimum of 4-6 hours a night.  Change equipment as directed. Wash your tubing with warm soap and water daily, hang to dry. Wash humidifier portion weekly. Use bottled, distilled water and change daily Be aware of reduced alertness and do not drive or operate heavy machinery if experiencing this or drowsiness.  Exercise encouraged, as tolerated. Healthy weight management discussed.  Avoid or decrease alcohol consumption and medications that make you more sleepy, if possible. Notify if persistent daytime sleepiness occurs even with consistent use of PAP therapy.  Follow up in 6 months with Dr. Baldwin Levee or Gina Lagos. If symptoms do not improve or worsen, please contact office for sooner follow up or seek emergency care.
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
## 2024-04-30 ENCOUNTER — Encounter: Payer: Self-pay | Admitting: Pulmonary Disease

## 2024-04-30 ENCOUNTER — Ambulatory Visit: Payer: MEDICARE

## 2024-04-30 VITALS — BP 123/73 | HR 75 | Temp 98.8°F | Resp 14 | Ht 71.0 in | Wt 328.8 lb

## 2024-04-30 DIAGNOSIS — J4540 Moderate persistent asthma, uncomplicated: Secondary | ICD-10-CM | POA: Diagnosis not present

## 2024-04-30 MED ORDER — OMALIZUMAB 150 MG/ML ~~LOC~~ SOSY
300.0000 mg | PREFILLED_SYRINGE | Freq: Once | SUBCUTANEOUS | Status: AC
  Administered 2024-04-30: 300 mg via SUBCUTANEOUS
  Filled 2024-04-30: qty 2

## 2024-04-30 NOTE — Progress Notes (Signed)
 Diagnosis: Asthma  Provider:  Chilton Greathouse MD  Procedure: Injection  Xolair (Omalizumab), Dose: 300 mg, Site: subcutaneous, Number of injections: 2  Injection Site(s): Left arm and Right arm  Post Care:  left and right arm injections  Discharge: Condition: Good, Destination: Home . AVS Declined  Performed by:  Rico Ala, LPN

## 2024-05-03 ENCOUNTER — Other Ambulatory Visit: Payer: Self-pay | Admitting: Nurse Practitioner

## 2024-05-05 NOTE — Telephone Encounter (Signed)
 Requested Prescriptions  Pending Prescriptions Disp Refills   fluticasone  (FLONASE ) 50 MCG/ACT nasal spray [Pharmacy Med Name: FLUTICASONE  NASAL SP (120) RX] 48 g 1    Sig: SHAKE LIQUID AND USE 2 SPRAYS IN EACH NOSTRIL EVERY DAY     Ear, Nose, and Throat: Nasal Preparations - Corticosteroids Passed - 05/05/2024  2:40 PM      Passed - Valid encounter within last 12 months    Recent Outpatient Visits           1 month ago Aortic atherosclerosis (HCC)    [facility] [place], Melanie DASEN, NP       Future Appointments             In 1 month Claudene Lehmann, MD [facility]

## 2024-05-06 ENCOUNTER — Encounter: Payer: Self-pay | Admitting: Pulmonary Disease

## 2024-05-28 ENCOUNTER — Ambulatory Visit: Payer: MEDICARE

## 2024-05-28 ENCOUNTER — Encounter: Payer: Self-pay | Admitting: Pulmonary Disease

## 2024-05-28 VITALS — BP 127/70 | HR 96 | Temp 98.7°F | Resp 12 | Ht 71.0 in | Wt 335.2 lb

## 2024-05-28 DIAGNOSIS — J4540 Moderate persistent asthma, uncomplicated: Secondary | ICD-10-CM | POA: Diagnosis not present

## 2024-05-28 MED ORDER — OMALIZUMAB 150 MG/ML ~~LOC~~ SOSY
300.0000 mg | PREFILLED_SYRINGE | Freq: Once | SUBCUTANEOUS | Status: AC
Start: 2024-05-28 — End: 2024-05-28
  Administered 2024-05-28: 300 mg via SUBCUTANEOUS
  Filled 2024-05-28: qty 2

## 2024-05-28 NOTE — Progress Notes (Signed)
 Diagnosis: Asthma  Provider:  Chilton Greathouse MD  Procedure: Injection  Xolair (Omalizumab), Dose: 300 mg, Site: subcutaneous, Number of injections: 2  Injection Site(s): Left arm and Right arm  Post Care: Patient declined observation  Discharge: Condition: Stable, Destination: Home . AVS Declined  Performed by:  Wyvonne Lenz, RN

## 2024-06-09 ENCOUNTER — Ambulatory Visit: Payer: MEDICARE

## 2024-06-09 VITALS — Ht 71.0 in | Wt 335.0 lb

## 2024-06-09 DIAGNOSIS — Z0000 Encounter for general adult medical examination without abnormal findings: Secondary | ICD-10-CM

## 2024-06-09 NOTE — Patient Instructions (Signed)
 Mr. Jose Liu , Thank you for taking time out of your busy schedule to complete your Annual Wellness Visit with me. I enjoyed our conversation and look forward to speaking with you again next year. I, as well as your care team,  appreciate your ongoing commitment to your health goals. Please review the following plan we discussed and let me know if I can assist you in the future. Your Game plan/ To Do List     Follow up Visits: We will see or speak with you next year for your Next Medicare AWV with our clinical staff Have you seen your provider in the last 6 months (3 months if uncontrolled diabetes)? Yes  Clinician Recommendations:  Aim for 30 minutes of exercise or brisk walking, 6-8 glasses of water, and 5 servings of fruits and vegetables each day.       This is a list of the screenings recommended for you:  Health Maintenance  Topic Date Due   COVID-19 Vaccine (6 - 2024-25 season) 06/30/2023   Flu Shot  05/29/2024   DTaP/Tdap/Td vaccine (2 - Tdap) 03/18/2025*   Medicare Annual Wellness Visit  06/09/2025   Colon Cancer Screening  08/29/2028   Pneumococcal Vaccine for age over 33  Completed   Hepatitis C Screening  Completed   Hepatitis B Vaccine  Aged Out   HPV Vaccine  Aged Out   Meningitis B Vaccine  Aged Out   Zoster (Shingles) Vaccine  Discontinued  *Topic was postponed. The date shown is not the original due date.    Advanced directives: (ACP Link)Information on Advanced Care Planning can be found at [place]  Secretary of [facility] Advance Health Care Directives Advance Health Care Directives. [email or web address]   Advance Care Planning is important because it:  [x]  Makes sure you receive the medical care that is consistent with your values, goals, and preferences  [x]  It provides guidance to your family and loved ones and reduces their decisional burden about whether or not they are making the right decisions based on your wishes.  Follow the link provided in your after visit  summary or read over the paperwork we have mailed to you to help you started getting your Advance Directives in place. If you need assistance in completing these, please reach out to us  so that we can help you!  See attachments for Preventive Care and Fall Prevention Tips.
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
## 2024-06-22 ENCOUNTER — Ambulatory Visit: Payer: MEDICARE | Admitting: Dermatology

## 2024-06-22 ENCOUNTER — Encounter: Payer: Self-pay | Admitting: Dermatology

## 2024-06-22 DIAGNOSIS — W908XXA Exposure to other nonionizing radiation, initial encounter: Secondary | ICD-10-CM | POA: Diagnosis not present

## 2024-06-22 DIAGNOSIS — L570 Actinic keratosis: Secondary | ICD-10-CM | POA: Diagnosis not present

## 2024-06-22 DIAGNOSIS — L578 Other skin changes due to chronic exposure to nonionizing radiation: Secondary | ICD-10-CM

## 2024-06-22 DIAGNOSIS — D229 Melanocytic nevi, unspecified: Secondary | ICD-10-CM

## 2024-06-22 DIAGNOSIS — L821 Other seborrheic keratosis: Secondary | ICD-10-CM

## 2024-06-22 DIAGNOSIS — Z1283 Encounter for screening for malignant neoplasm of skin: Secondary | ICD-10-CM

## 2024-06-22 DIAGNOSIS — D1801 Hemangioma of skin and subcutaneous tissue: Secondary | ICD-10-CM

## 2024-06-22 DIAGNOSIS — L814 Other melanin hyperpigmentation: Secondary | ICD-10-CM | POA: Diagnosis not present

## 2024-06-22 DIAGNOSIS — Z85828 Personal history of other malignant neoplasm of skin: Secondary | ICD-10-CM

## 2024-06-22 NOTE — Progress Notes (Signed)
 Follow-Up Visit   Subjective  Jose Liu is a 68 y.o. male who presents for the following: Skin Cancer Screening and Full Body Skin Exam  The patient presents for Total-Body Skin Exam (TBSE) for skin cancer screening and mole check. The patient has spots, moles and lesions to be evaluated, some may be new or changing and the patient may have concern these could be cancer.  No hx skin cancer. Patient's father with hx of skin cancer. Patient with a dark spot at face that he has noticed recently.   The following portions of the chart were reviewed this encounter and updated as appropriate: medications, allergies, medical history  Review of Systems:  No other skin or systemic complaints except as noted in HPI or Assessment and Plan.  Objective  Well appearing patient in no apparent distress; mood and affect are within normal limits.  A full examination was performed including scalp, head, eyes, ears, nose, lips, neck, chest, axillae, abdomen, back, buttocks, bilateral upper extremities, bilateral lower extremities, hands, feet, fingers, toes, fingernails, and toenails. All findings within normal limits unless otherwise noted below.   Relevant physical exam findings are noted in the Assessment and Plan.  R infraorbital x 1 Erythematous thin papules/macules with gritty scale.   Assessment & Plan   SKIN CANCER SCREENING PERFORMED TODAY.  ACTINIC DAMAGE - Chronic condition, secondary to cumulative UV/sun exposure - diffuse scaly erythematous macules with underlying dyspigmentation - Recommend daily broad spectrum sunscreen SPF 30+ to sun-exposed areas, reapply every 2 hours as needed.  - Staying in the shade or wearing long sleeves, sun glasses (UVA+UVB protection) and wide brim hats (4-inch brim around the entire circumference of the hat) are also recommended for sun protection.  - Call for new or changing lesions.  LENTIGINES, SEBORRHEIC KERATOSES, HEMANGIOMAS - Benign normal skin  lesions - Benign-appearing - Call for any changes  MELANOCYTIC NEVI - Tan-brown and/or pink-flesh-colored symmetric macules and papules - Benign appearing on exam today - Observation - Call clinic for new or changing moles - Recommend daily use of broad spectrum spf 30+ sunscreen to sun-exposed areas.   FAMILY HISTORY OF SKIN CANCER What type(s): probable melanoma Who affected: father   AK (ACTINIC KERATOSIS) R infraorbital x 1 Actinic keratoses are precancerous spots that appear secondary to cumulative UV radiation exposure/sun exposure over time. They are chronic with expected duration over 1 year. A portion of actinic keratoses will progress to squamous cell carcinoma of the skin. It is not possible to reliably predict which spots will progress to skin cancer and so treatment is recommended to prevent development of skin cancer.  Recommend daily broad spectrum sunscreen SPF 30+ to sun-exposed areas, reapply every 2 hours as needed.  Recommend staying in the shade or wearing long sleeves, sun glasses (UVA+UVB protection) and wide brim hats (4-inch brim around the entire circumference of the hat). Call for new or changing lesions. Destruction of lesion - R infraorbital x 1 Complexity: simple   Destruction method: cryotherapy   Informed consent: discussed and consent obtained   Timeout:  patient name, date of birth, surgical site, and procedure verified Lesion destroyed using liquid nitrogen: Yes   Region frozen until ice ball extended beyond lesion: Yes   Cryo cycles: 1 or 2. Outcome: patient tolerated procedure well with no complications   Post-procedure details: wound care instructions given    MULTIPLE BENIGN NEVI   LENTIGINES   ACTINIC ELASTOSIS   SEBORRHEIC KERATOSES   CHERRY ANGIOMA  Return in about 1 year (around 06/22/2025) for TBSE, with Dr. Claudene, HxAK, FhxMM.  LILLETTE Jose Liu, RMA, am acting as scribe for Boneta Claudene, MD .   Documentation: I  have reviewed the above documentation for accuracy and completeness, and I agree with the above.  Boneta Claudene, MD

## 2024-06-22 NOTE — Patient Instructions (Addendum)

## 2024-06-25 ENCOUNTER — Ambulatory Visit: Payer: MEDICARE

## 2024-06-25 VITALS — BP 109/63 | HR 96 | Temp 98.0°F | Resp 16 | Ht 71.0 in | Wt 331.4 lb

## 2024-06-25 DIAGNOSIS — J4540 Moderate persistent asthma, uncomplicated: Secondary | ICD-10-CM

## 2024-06-25 MED ORDER — OMALIZUMAB 150 MG/ML ~~LOC~~ SOSY
300.0000 mg | PREFILLED_SYRINGE | Freq: Once | SUBCUTANEOUS | Status: AC
  Administered 2024-06-25: 300 mg via SUBCUTANEOUS
  Filled 2024-06-25: qty 2

## 2024-06-25 NOTE — Progress Notes (Signed)
 Diagnosis: Asthma  Provider:  Chilton Greathouse MD  Procedure: Injection  Xolair (Omalizumab), Dose: 300 mg, Site: subcutaneous, Number of injections: 2  Injection Site(s): Left arm and Right arm  Post Care: Patient declined observation  Discharge: Condition: Good, Destination: Home . AVS Declined  Performed by:  Loney Hering, LPN

## 2024-07-10 ENCOUNTER — Telehealth: Payer: Self-pay

## 2024-07-17 ENCOUNTER — Telehealth: Payer: Self-pay

## 2024-07-17 NOTE — Telephone Encounter (Signed)
 CMN received from [facility] for Budesonide  rx.  This has been signed by RB and successfully faxed to [phone].

## 2024-07-23 ENCOUNTER — Ambulatory Visit: Payer: MEDICARE

## 2024-07-27 ENCOUNTER — Encounter: Payer: Self-pay | Admitting: Nurse Practitioner

## 2024-07-27 DIAGNOSIS — S46219A Strain of muscle, fascia and tendon of other parts of biceps, unspecified arm, initial encounter: Secondary | ICD-10-CM | POA: Insufficient documentation

## 2024-07-30 ENCOUNTER — Other Ambulatory Visit: Payer: Self-pay | Admitting: Nurse Practitioner

## 2024-07-30 ENCOUNTER — Ambulatory Visit (INDEPENDENT_AMBULATORY_CARE_PROVIDER_SITE_OTHER): Payer: MEDICARE | Admitting: *Deleted

## 2024-07-30 VITALS — BP 120/74 | HR 82 | Temp 98.2°F | Resp 16 | Ht 71.0 in | Wt 336.4 lb

## 2024-07-30 DIAGNOSIS — J4540 Moderate persistent asthma, uncomplicated: Secondary | ICD-10-CM

## 2024-07-30 MED ORDER — OMALIZUMAB 150 MG/ML ~~LOC~~ SOSY
300.0000 mg | PREFILLED_SYRINGE | Freq: Once | SUBCUTANEOUS | Status: AC
  Administered 2024-07-30: 300 mg via SUBCUTANEOUS

## 2024-07-30 NOTE — Progress Notes (Signed)
 Diagnosis: Asthma  Provider:  Mannam, Praveen MD  Procedure: Injection  Xolair  (Omalizumab ), Dose: 300 mg, Site: subcutaneous, Number of injections: 2  Injection Site(s): Left arm and right arm  Post Care: Patient declined observation  Discharge: Condition: Good, Destination: Home . AVS Declined  Performed by:  Mathew Therisa NOVAK, RN

## 2024-07-31 NOTE — Telephone Encounter (Signed)
 Requested Prescriptions  Pending Prescriptions Disp Refills   pantoprazole  (PROTONIX ) 40 MG tablet [Pharmacy Med Name: PANTOPRAZOLE  40MG  TABLETS] 90 tablet 4    Sig: TAKE 1 TABLET(40 MG) BY MOUTH DAILY     Gastroenterology: Proton Pump Inhibitors Passed - 07/31/2024  1:23 PM      Passed - Valid encounter within last 12 months    Recent Outpatient Visits           4 months ago Aortic atherosclerosis   [place] [facility] [place], Melanie T, NP       Future Appointments             In 10 months Claudene Lehmann, MD [place] [facility]             lisinopril -hydrochlorothiazide  (ZESTORETIC ) 10-12.5 MG tablet [Pharmacy Med Name: LISINOPRIL -HCTZ 10/12.5MG  TABLETS] 90 tablet 4    Sig: TAKE 1 TABLET BY MOUTH DAILY     Cardiovascular:  ACEI + Diuretic Combos Passed - 07/31/2024  1:23 PM      Passed - Na in normal range and within 180 days    Sodium  Date Value Ref Range Status  03/18/2024 139 134 - 144 mmol/L Final         Passed - K in normal range and within 180 days    Potassium  Date Value Ref Range Status  03/18/2024 4.4 3.5 - 5.2 mmol/L Final         Passed - Cr in normal range and within 180 days    Creatinine, Ser  Date Value Ref Range Status  03/18/2024 0.82 0.76 - 1.27 mg/dL Final         Passed - eGFR is 30 or above and within 180 days    GFR calc Af Amer  Date Value Ref Range Status  12/18/2019 99 >59 mL/min/1.73 Final   GFR, Estimated  Date Value Ref Range Status  03/02/2021 >60 >60 mL/min Final    Comment:    (NOTE) Calculated using the CKD-EPI Creatinine Equation (2021)    eGFR  Date Value Ref Range Status  03/18/2024 97 >59 mL/min/1.73 Final         Passed - Patient is not pregnant      Passed - Last BP in normal range    BP Readings from Last 1 Encounters:  07/30/24 120/74         Passed - Valid encounter within last 6 months    Recent Outpatient Visits           4 months ago Aortic atherosclerosis    [facility] [place], Melanie DASEN, NP       Future Appointments             In 10 months Claudene Lehmann, MD [place] [facility]             montelukast  (SINGULAIR ) 10 MG tablet [Pharmacy Med Name: MONTELUKAST  10MG  TABLETS] 90 tablet 4    Sig: TAKE 1 TABLET BY MOUTH EVERY DAY AT BEDTIME     Pulmonology:  Leukotriene Inhibitors Passed - 07/31/2024  1:23 PM      Passed - Valid encounter within last 12 months    Recent Outpatient Visits           4 months ago Aortic atherosclerosis   [place] [facility] Valerio Melanie DASEN, NP       Future Appointments  In 10 months Claudene Lehmann, MD [place] [facility]             famotidine  (PEPCID ) 40 MG tablet [Pharmacy Med Name: FAMOTIDINE  40MG  TABLETS] 90 tablet 4    Sig: TAKE 1 TABLET(40 MG) BY MOUTH DAILY     Gastroenterology:  H2 Antagonists Passed - 07/31/2024  1:23 PM      Passed - Valid encounter within last 12 months    Recent Outpatient Visits           4 months ago Aortic atherosclerosis   [facility] Valerio Melanie DASEN, NP       Future Appointments             In 10 months Claudene Lehmann, MD [facility]

## 2024-08-11 ENCOUNTER — Telehealth: Payer: Self-pay

## 2024-08-11 NOTE — Telephone Encounter (Signed)
 Copied from CRM [id number]. Topic: General - Other >> Aug 11, 2024  8:44 AM Joesph PARAS wrote: Reason for CRM: Katheryn with [facility] is calling to confirm that we have received the medical recommendation form for Zolair via fax on 08/10/2024. Please return call to [phone] ext [phone].  It looks like the pt goes to the [place] location. Can someone please advise this.

## 2024-08-12 NOTE — Telephone Encounter (Signed)
 Fax has been signed by RB and faxed successfully to [place].  The form was directed to Dr. Malachy but Dr. Shelah is the one who prescribed rx.  Fax still sent as Dr. Shelah approved OK to hold Xolair .

## 2024-08-12 NOTE — Telephone Encounter (Signed)
 I spoke with Jose Liu at the [facility]ty [facility]. The patient is scheduled to have surgery on 10/17. She has emailed me the form for Dr. Shelah to sign. The form has been forwarded to Dr. Lanny CMA.

## 2024-08-12 NOTE — Telephone Encounter (Signed)
 Noted. Nothing further needed.

## 2024-08-12 NOTE — Telephone Encounter (Signed)
 Form received, waiting for RB signature.

## 2024-08-18 ENCOUNTER — Encounter: Payer: Self-pay | Admitting: Nurse Practitioner

## 2024-08-20 ENCOUNTER — Ambulatory Visit: Payer: MEDICARE

## 2024-08-27 ENCOUNTER — Ambulatory Visit (INDEPENDENT_AMBULATORY_CARE_PROVIDER_SITE_OTHER): Payer: MEDICARE

## 2024-08-27 VITALS — BP 108/66 | HR 84 | Temp 98.1°F | Resp 14 | Ht 71.0 in | Wt 337.6 lb

## 2024-08-27 DIAGNOSIS — J4540 Moderate persistent asthma, uncomplicated: Secondary | ICD-10-CM | POA: Diagnosis not present

## 2024-08-27 MED ORDER — OMALIZUMAB 150 MG/ML ~~LOC~~ SOSY
300.0000 mg | PREFILLED_SYRINGE | Freq: Once | SUBCUTANEOUS | Status: AC
  Administered 2024-08-27: 300 mg via SUBCUTANEOUS

## 2024-08-27 NOTE — Progress Notes (Signed)
 Diagnosis: Asthma  Provider:  Mannam, Praveen MD  Procedure: Injection  Xolair  (Omalizumab ), Dose: 300 mg, Site: subcutaneous, Number of injections: 2  Injection Site(s): Right upper quad. abdomen and Left arm  Post Care: Patient declined observation  Discharge: Condition: Good, Destination: Home . AVS Declined  Performed by:  Rocky FORBES Sar, RN

## 2024-08-31 NOTE — Telephone Encounter (Signed)
 Does not need to reschedule now.

## 2024-09-17 ENCOUNTER — Ambulatory Visit: Payer: MEDICARE

## 2024-09-20 NOTE — Patient Instructions (Signed)
Wegovy or Zepbound  Focus on DASH diet for high blood pressure or Mediterranean diet  Be Involved in Caring For Your Health:  Taking Medications When medications are taken as directed, they can greatly improve your health. But if they are not taken as prescribed, they may not work. In some cases, not taking them correctly can be harmful. To help ensure your treatment remains effective and safe, understand your medications and how to take them. Bring your medications to each visit for review by your provider.  Your lab results, notes, and after visit summary will be available on My Chart. We strongly encourage you to use this feature. If lab results are abnormal the clinic will contact you with the appropriate steps. If the clinic does not contact you assume the results are satisfactory. You can always view your results on My Chart. If you have questions regarding your health or results, please contact the clinic during office hours. You can also ask questions on My Chart.  We at [facility] are grateful that you chose us to provide your care. We strive to provide evidence-based and compassionate care and are always looking for feedback. If you get a survey from the clinic please complete this so we can hear your opinions.  Preventing High Cholesterol Cholesterol is a white, waxy substance similar to fat that the human body needs to help build cells. The liver makes all the cholesterol that a person's body needs. Having high cholesterol (hypercholesterolemia) increases your risk for heart disease and stroke. Extra or excess cholesterol comes from the food that you eat. High cholesterol can often be prevented with diet and lifestyle changes. If you already have high cholesterol, you can control it with diet, lifestyle changes, and medicines. How can high cholesterol affect me? If you have high cholesterol, fatty deposits (plaques) may build up on the walls of your blood vessels. The blood  vessels that carry blood away from your heart are called arteries. Plaques make the arteries narrower and stiffer. This in turn can: Restrict or block blood flow and cause blood clots to form. Increase your risk for heart attack and stroke. What can increase my risk for high cholesterol? This condition is more likely to develop in people who: Eat foods that are high in saturated fat or cholesterol. Saturated fat is mostly found in foods that come from animal sources. Are overweight. Are not getting enough exercise. Use products that contain nicotine or tobacco, such as cigarettes, e-cigarettes, and chewing tobacco. Have a family history of high cholesterol (familial hypercholesterolemia). What actions can I take to prevent this? Nutrition  Eat less saturated fat. Avoid trans fats (partially hydrogenated oils). These are often found in margarine and in some baked goods, fried foods, and snacks bought in packages. Avoid precooked or cured meat, such as bacon, sausages, or meat loaves. Avoid foods and drinks that have added sugars. Eat more fruits, vegetables, and whole grains. Choose healthy sources of protein, such as fish, poultry, lean cuts of red meat, beans, peas, lentils, and nuts. Choose healthy sources of fat, such as: Nuts. Vegetable oils, especially olive oil. Fish that have healthy fats, such as omega-3 fatty acids. These fish include mackerel or salmon. Lifestyle Lose weight if you are overweight. Maintaining a healthy body mass index (BMI) can help prevent or control high cholesterol. It can also lower your risk for diabetes and high blood pressure. Ask your health care provider to help you with a diet and exercise plan to lose   weight safely. Do not use any products that contain nicotine or tobacco. These products include cigarettes, chewing tobacco, and vaping devices, such as e-cigarettes. If you need help quitting, ask your health care provider. Alcohol use Do not drink  alcohol if: Your health care provider tells you not to drink. You are pregnant, may be pregnant, or are planning to become pregnant. If you drink alcohol: Limit how much you have to: 0-1 drink a day for women. 0-2 drinks a day for men. Know how much alcohol is in your drink. In the [place] one drink equals one 12 oz bottle of beer (355 mL), one 5 oz glass of wine (148 mL), or one 1 oz glass of hard liquor (44 mL). Activity  Get enough exercise. Do exercises as told by your health care provider. Each week, do at least 150 minutes of exercise that takes a medium level of effort (moderate-intensity exercise). This kind of exercise: Makes your heart beat faster while allowing you to still be able to talk. Can be done in short sessions several times a day or longer sessions a few times a week. For example, on 5 days each week, you could walk fast or ride your bike 3 times a day for 10 minutes each time. Medicines Your health care provider may recommend medicines to help lower cholesterol. This may be a medicine to lower the amount of cholesterol that your liver makes. You may need medicine if: Diet and lifestyle changes have not lowered your cholesterol enough. You have high cholesterol and other risk factors for heart disease or stroke. Take over-the-counter and prescription medicines only as told by your health care provider. General information Manage your risk factors for high cholesterol. Talk with your health care provider about all your risk factors and how to lower your risk. Manage other conditions that you have, such as diabetes or high blood pressure (hypertension). Have blood tests to check your cholesterol levels at regular points in time as told by your health care provider. Keep all follow-up visits. This is important. Where to find more information American Heart Association: www.heart.org National Heart, Lung, and Blood Institute: www.nhlbi.nih.gov Summary High cholesterol  increases your risk for heart disease and stroke. By keeping your cholesterol level low, you can reduce your risk for these conditions. High cholesterol can often be prevented with diet and lifestyle changes. Work with your health care provider to manage your risk factors, and have your blood tested regularly. This information is not intended to replace advice given to you by your health care provider. Make sure you discuss any questions you have with your health care provider. Document Revised: 05/18/2022 Document Reviewed: 12/19/2020 Elsevier Patient Education  2024 Elsevier Inc.  

## 2024-09-22 ENCOUNTER — Ambulatory Visit: Payer: MEDICARE | Admitting: Nurse Practitioner

## 2024-09-22 ENCOUNTER — Encounter: Payer: Self-pay | Admitting: Nurse Practitioner

## 2024-09-22 VITALS — BP 117/64 | HR 72 | Temp 98.4°F | Resp 17 | Ht 70.98 in | Wt 320.0 lb

## 2024-09-22 DIAGNOSIS — I10 Essential (primary) hypertension: Secondary | ICD-10-CM | POA: Diagnosis not present

## 2024-09-22 DIAGNOSIS — N529 Male erectile dysfunction, unspecified: Secondary | ICD-10-CM | POA: Insufficient documentation

## 2024-09-22 DIAGNOSIS — J4540 Moderate persistent asthma, uncomplicated: Secondary | ICD-10-CM

## 2024-09-22 DIAGNOSIS — E7800 Pure hypercholesterolemia, unspecified: Secondary | ICD-10-CM | POA: Diagnosis not present

## 2024-09-22 DIAGNOSIS — R7309 Other abnormal glucose: Secondary | ICD-10-CM

## 2024-09-22 DIAGNOSIS — Z6841 Body Mass Index (BMI) 40.0 and over, adult: Secondary | ICD-10-CM

## 2024-09-22 DIAGNOSIS — E66813 Obesity, class 3: Secondary | ICD-10-CM

## 2024-09-22 DIAGNOSIS — N528 Other male erectile dysfunction: Secondary | ICD-10-CM

## 2024-09-22 DIAGNOSIS — G4733 Obstructive sleep apnea (adult) (pediatric): Secondary | ICD-10-CM

## 2024-09-22 DIAGNOSIS — Z23 Encounter for immunization: Secondary | ICD-10-CM

## 2024-09-22 MED ORDER — TADALAFIL 20 MG PO TABS: 10.0000 mg | ORAL_TABLET | ORAL | 11 refills | Status: AC | PRN

## 2024-09-22 MED ORDER — SILDENAFIL CITRATE 100 MG PO TABS: 50.0000 mg | ORAL_TABLET | Freq: Every day | ORAL | 11 refills | Status: AC | PRN

## 2024-09-22 NOTE — Assessment & Plan Note (Signed)
 Noted on past labs, wishes not to start statin therapy.  Recheck labs outpatient.  CT Cardiac Scoring on 09/20/23 was 28.5 with recommendation to continue focus on heart healthy lifestyle and activity.  Plan to repeat this in November 2026. The 10-year ASCVD risk score (Arnett DK, et al., 2019) is: 13.9%   Values used to calculate the score:     Age: 68 years     Clincally relevant sex: Male     Is Non-Hispanic African American: No     Diabetic: No     Tobacco smoker: No     Systolic Blood Pressure: 117 mmHg     Is BP treated: Yes     HDL Cholesterol: 49 mg/dL     Total Cholesterol: 180 mg/dL

## 2024-09-22 NOTE — Assessment & Plan Note (Signed)
 Chronic, stable.  BP at goal in office today.  Recommend he monitor BP at least a few mornings a week at home and document.  DASH diet at home.  Continue current medication regimen and adjust as needed.  Labs today: CMP future labs.  Return in 6 months.

## 2024-09-22 NOTE — Assessment & Plan Note (Signed)
 Noted on past labs, is diet focused. Recent A1c 5.8%.  Recheck outpatient.  Continue focus on diet and regular exercise.

## 2024-09-22 NOTE — Assessment & Plan Note (Signed)
Chronic, stable with good compliance to CPAP.  Continue to utilize daily. 

## 2024-09-22 NOTE — Progress Notes (Signed)
 BP 117/64 (BP Location: Left Arm, Patient Position: Sitting, Cuff Size: Large)   Pulse 72   Temp 98.4 F (36.9 C) (Oral)   Resp 17   Ht 5' 10.98 (1.803 m)   Wt (!) 320 lb (145.2 kg)   SpO2 97%   BMI 44.65 kg/m    Subjective:    Patient ID: Jose Liu, male    DOB: [birth year], 68 y.o.   MRN: [id number]  HPI: Jose Liu is a 68 y.o. male  Chief Complaint  Patient presents with   HTN/HLD    No concerns.    Gastroesophageal Reflux    Doing pretty good. Am and Pm meds seem to really keep it controlled.    Asthma    Can be congested in the morning but tends to clear through the day. Questions directions on his inhaler.    HYPERTENSION & HLD Continues Lisinopril -HCTZ 10-12.5 MG once a day. Refuses cholesterol medication, had CT cardiac scoring on 09/20/23 which had score 28.5 with recommendation for heart healthy lifestyle and risk modification.  Currently is having issues with ED, cannot get fully erect with manipulation and during intercourse. Erections are not lasting long, sometimes by time for act of intercourse it is gone. Hypertension status: stable  Satisfied with current treatment? yes Duration of hypertension: chronic BP monitoring frequency: not checking BP range:  BP medication side effects:  no Medication compliance: good compliance Previous BP meds: Losartan-HCTZ Aspirin: no Recurrent headaches: no Visual changes: no Palpitations: no Dyspnea: on occasion in the morning due to congestion Chest pain: no Lower extremity edema: no Dizzy/lightheaded: no  The 10-year ASCVD risk score (Arnett DK, et al., 2019) is: 13.9%   Values used to calculate the score:     Age: 56 years     Clincally relevant sex: Male     Is Non-Hispanic African American: No     Diabetic: No     Tobacco smoker: No     Systolic Blood Pressure: 117 mmHg     Is BP treated: Yes     HDL Cholesterol: 49 mg/dL     Total Cholesterol: 180 mg/dL  GERD Continues to take Protonix  40 MG  daily and Famotidine  40 MG daily.  GERD control status: stable  Satisfied with current treatment? yes Heartburn frequency: none Medication side effects: no  Medication compliance: stable Previous GERD medications: Antacid use frequency:  none Dysphagia: no Odynophagia:  no Hematemesis: no Blood in stool: no EGD: no    ASTHMA/ALLERGIC RHINITIS Saw pulmonary last on 04/17/24. Continues Xolair  shots. 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 Medications include Singulair , Claritin, and Flonase . Has OSA and uses CPAP 100% of the time. Refuses flu and pneumococcal vaccines. Asthma status: stable Satisfied with current treatment?: yes Albuterol /rescue inhaler frequency: occasional Dyspnea frequency: as above Wheezing frequency: no Cough frequency: no Nocturnal symptom frequency: none Limitation of activity: no Current upper respiratory symptoms: no Triggers: cats and dogs, various allergies Home peak flows: none Last Spirometry: with pulmonary Failed/intolerant to following asthma meds: none Asthma meds in past: Albuterol  Aerochamber/spacer use: no Visits to ER or Urgent Care in past year: no Pneumovax: refuses Influenza: refuses, make him sick     09/22/2024    8:13 AM 06/09/2024    8:56 AM 04/02/2024   10:14 AM 03/18/2024    8:12 AM 03/13/2023    8:33 AM  Depression screen PHQ 2/9  Decreased Interest 0 0 0 0 0  Down, Depressed, Hopeless 0 0 0 0 0  PHQ -  2 Score 0 0 0 0 0  Altered sleeping 0   0 0  Tired, decreased energy 0   0 0  Change in appetite 0   0 0  Feeling bad or failure about yourself  0   0 0  Trouble concentrating 0   0 0  Moving slowly or fidgety/restless 0   0 0  Suicidal thoughts 0   0 0  PHQ-9 Score 0   0  0   Difficult doing work/chores    Not difficult at all Not difficult at all     Data saved with a previous flowsheet row definition       09/22/2024    8:13 AM 03/18/2024    8:12 AM 03/13/2023    8:33 AM 03/12/2022    8:39 AM  GAD 7 : Generalized Anxiety Score   Nervous, Anxious, on Edge 0 0 0 0  Control/stop worrying 0 0 0 0  Worry too much - different things 0 0 0 0  Trouble relaxing 0 0 0 0  Restless 0 0 0 0  Easily annoyed or irritable 0 0 0 0  Afraid - awful might happen 0 0 0 0  Total GAD 7 Score 0 0 0 0  Anxiety Difficulty  Not difficult at all Not difficult at all Not difficult at all   Relevant past medical, surgical, family and social history reviewed and updated as indicated. Interim medical history since our last visit reviewed. Allergies and medications reviewed and updated.  Review of Systems  Constitutional:  Negative for activity change, diaphoresis, fatigue and fever.  Respiratory:  Negative for cough, chest tightness, shortness of breath and wheezing.   Cardiovascular:  Negative for chest pain, palpitations and leg swelling.  Gastrointestinal: Negative.   Endocrine: Negative.   Neurological: Negative.   Psychiatric/Behavioral: Negative.     Per HPI unless specifically indicated above     Objective:    BP 117/64 (BP Location: Left Arm, Patient Position: Sitting, Cuff Size: Large)   Pulse 72   Temp 98.4 F (36.9 C) (Oral)   Resp 17   Ht 5' 10.98 (1.803 m)   Wt (!) 320 lb (145.2 kg)   SpO2 97%   BMI 44.65 kg/m   Wt Readings from Last 3 Encounters:  09/22/24 (!) 320 lb (145.2 kg)  08/27/24 (!) 337 lb 9.6 oz (153.1 kg)  07/30/24 (!) 336 lb 6.4 oz (152.6 kg)    Physical Exam Vitals and nursing note reviewed.  Constitutional:      General: He is awake. He is not in acute distress.    Appearance: He is well-developed and well-groomed. He is morbidly obese. He is not ill-appearing or toxic-appearing.  HENT:     Head: Normocephalic and atraumatic.     Right Ear: Hearing, tympanic membrane, ear canal and external ear normal. No drainage.     Left Ear: Hearing, tympanic membrane, ear canal and external ear normal. No drainage.  Eyes:     General: Lids are normal.        Right eye: No discharge.        Left eye:  No discharge.     Conjunctiva/sclera: Conjunctivae normal.     Pupils: Pupils are equal, round, and reactive to light.  Neck:     Thyroid : No thyromegaly.     Vascular: No carotid bruit.     Trachea: Trachea normal.  Cardiovascular:     Rate and Rhythm: Normal rate and regular rhythm.  Heart sounds: Normal heart sounds, S1 normal and S2 normal. No murmur heard.    No gallop.  Pulmonary:     Effort: Pulmonary effort is normal. No accessory muscle usage or respiratory distress.     Breath sounds: Normal breath sounds.  Abdominal:     General: Bowel sounds are normal.     Palpations: Abdomen is soft.  Musculoskeletal:        General: Normal range of motion.     Cervical back: Normal range of motion and neck supple.     Right lower leg: No edema.     Left lower leg: No edema.  Skin:    General: Skin is warm and dry.     Capillary Refill: Capillary refill takes less than 2 seconds.  Neurological:     Mental Status: He is alert and oriented to person, place, and time.     Deep Tendon Reflexes: Reflexes are normal and symmetric.     Reflex Scores:      Brachioradialis reflexes are 2+ on the right side and 2+ on the left side.      Patellar reflexes are 2+ on the right side and 2+ on the left side. Psychiatric:        Attention and Perception: Attention normal.        Mood and Affect: Mood normal.        Speech: Speech normal.        Behavior: Behavior normal. Behavior is cooperative.        Thought Content: Thought content normal.    Results for orders placed or performed in visit on 03/18/24  CBC with Differential/Platelet   Collection Time: 03/18/24  9:00 AM  Result Value Ref Range   WBC 6.6 3.4 - 10.8 x10E3/uL   RBC 5.08 4.14 - 5.80 x10E6/uL   Hemoglobin 15.3 13.0 - 17.7 g/dL   Hematocrit 53.8 62.4 - 51.0 %   MCV 91 79 - 97 fL   MCH 30.1 26.6 - 33.0 pg   MCHC 33.2 31.5 - 35.7 g/dL   RDW 87.1 88.3 - 84.5 %   Platelets 250 150 - 450 x10E3/uL   Neutrophils 66 Not  Estab. %   Lymphs 19 Not Estab. %   Monocytes 10 Not Estab. %   Eos 3 Not Estab. %   Basos 1 Not Estab. %   Neutrophils Absolute 4.3 1.4 - 7.0 x10E3/uL   Lymphocytes Absolute 1.2 0.7 - 3.1 x10E3/uL   Monocytes Absolute 0.7 0.1 - 0.9 x10E3/uL   EOS (ABSOLUTE) 0.2 0.0 - 0.4 x10E3/uL   Basophils Absolute 0.1 0.0 - 0.2 x10E3/uL   Immature Granulocytes 1 Not Estab. %   Immature Grans (Abs) 0.0 0.0 - 0.1 x10E3/uL  Comprehensive metabolic panel with GFR   Collection Time: 03/18/24  9:00 AM  Result Value Ref Range   Glucose 106 (H) 70 - 99 mg/dL   BUN 32 (H) 8 - 27 mg/dL   Creatinine, Ser 9.17 0.76 - 1.27 mg/dL   eGFR 97 >40 fO/fpw/8.26   BUN/Creatinine Ratio 39 (H) 10 - 24   Sodium 139 134 - 144 mmol/L   Potassium 4.4 3.5 - 5.2 mmol/L   Chloride 102 96 - 106 mmol/L   CO2 23 20 - 29 mmol/L   Calcium 9.5 8.6 - 10.2 mg/dL   Total Protein 6.6 6.0 - 8.5 g/dL   Albumin 4.4 3.9 - 4.9 g/dL   Globulin, Total 2.2 1.5 - 4.5 g/dL   Bilirubin Total 0.4  0.0 - 1.2 mg/dL   Alkaline Phosphatase 76 44 - 121 IU/L   AST 17 0 - 40 IU/L   ALT 21 0 - 44 IU/L  Lipid Panel w/o Chol/HDL Ratio   Collection Time: 03/18/24  9:00 AM  Result Value Ref Range   Cholesterol, Total 180 100 - 199 mg/dL   Triglycerides 79 0 - 149 mg/dL   HDL 49 >60 mg/dL   VLDL Cholesterol Cal 15 5 - 40 mg/dL   LDL Chol Calc (NIH) 883 (H) 0 - 99 mg/dL  Magnesium   Collection Time: 03/18/24  9:00 AM  Result Value Ref Range   Magnesium 2.3 1.6 - 2.3 mg/dL  PSA   Collection Time: 03/18/24  9:00 AM  Result Value Ref Range   Prostate Specific Ag, Serum 1.3 0.0 - 4.0 ng/mL  TSH   Collection Time: 03/18/24  9:00 AM  Result Value Ref Range   TSH 0.598 0.450 - 4.500 uIU/mL  HgB A1c   Collection Time: 03/18/24  9:00 AM  Result Value Ref Range   Hgb A1c MFr Bld 5.5 4.8 - 5.6 %   Est. average glucose Bld gHb Est-mCnc 111 mg/dL      Assessment & Plan:   Problem List Items Addressed This Visit       Cardiovascular and  Mediastinum   Essential hypertension   Chronic, stable.  BP at goal in office today.  Recommend he monitor BP at least a few mornings a week at home and document.  DASH diet at home.  Continue current medication regimen and adjust as needed.  Labs today: CMP future labs.  Return in 6 months.       Relevant Medications   tadalafil  (CIALIS ) 20 MG tablet   sildenafil  (VIAGRA ) 100 MG tablet   Other Relevant Orders   Comprehensive metabolic panel with GFR     Respiratory   OSA (obstructive sleep apnea)   Chronic, stable with good compliance to CPAP.  Continue to utilize daily.      Moderate persistent asthma   Chronic, stable.  No recent exacerbations.  Followed by pulmonary and allergist. Continue current medication regimen and adjust as needed.  Continue collaboration with pulmonary and review notes.  Is on ACE, if cough or refractory symptoms present then will determine alternate option for BP, such as Amlodipine or Losartan. Return in 6 months.          Other   Erectile dysfunction   We will trial either Cialis  or Viagra , he will see which one is covered and is most affordable. Discussed this at length and offered education.      Elevated low density lipoprotein (LDL) cholesterol level   Noted on past labs, wishes not to start statin therapy.  Recheck labs outpatient.  CT Cardiac Scoring on 09/20/23 was 28.5 with recommendation to continue focus on heart healthy lifestyle and activity.  Plan to repeat this in November 2026. The 10-year ASCVD risk score (Arnett DK, et al., 2019) is: 13.9%   Values used to calculate the score:     Age: 71 years     Clincally relevant sex: Male     Is Non-Hispanic African American: No     Diabetic: No     Tobacco smoker: No     Systolic Blood Pressure: 117 mmHg     Is BP treated: Yes     HDL Cholesterol: 49 mg/dL     Total Cholesterol: 180 mg/dL       Relevant  Orders   Comprehensive metabolic panel with GFR   Lipid Panel w/o Chol/HDL Ratio    Elevated hemoglobin A1c measurement   Noted on past labs, is diet focused. Recent A1c 5.8%.  Recheck outpatient.  Continue focus on diet and regular exercise.      Relevant Orders   HgB A1c   Class 3 severe obesity with body mass index (BMI) of 45.0 to 49.9 in adult (HCC) - Primary   BMI 44.65.  Recommended eating smaller high protein, low fat meals more frequently and exercising 30 mins a day 5 times a week with a goal of 10-15lb weight loss in the next 3 months. Patient voiced their understanding and motivation to adhere to these recommendations.         Follow up plan: Return in about 6 months (around 03/22/2025) for Annual Physical after 03/18/25.

## 2024-09-22 NOTE — Assessment & Plan Note (Signed)
 Chronic, stable.  No recent exacerbations.  Followed by pulmonary and allergist. Continue current medication regimen and adjust as needed.  Continue collaboration with pulmonary and review notes.  Is on ACE, if cough or refractory symptoms present then will determine alternate option for BP, such as Amlodipine or Losartan. Return in 6 months.

## 2024-09-22 NOTE — Assessment & Plan Note (Signed)
 We will trial either Cialis  or Viagra , he will see which one is covered and is most affordable. Discussed this at length and offered education.

## 2024-09-22 NOTE — Assessment & Plan Note (Signed)
 BMI 44.65.  Recommended eating smaller high protein, low fat meals more frequently and exercising 30 mins a day 5 times a week with a goal of 10-15lb weight loss in the next 3 months. Patient voiced their understanding and motivation to adhere to these recommendations.

## 2024-09-28 ENCOUNTER — Other Ambulatory Visit: Payer: MEDICARE

## 2024-09-28 DIAGNOSIS — I10 Essential (primary) hypertension: Secondary | ICD-10-CM

## 2024-09-28 DIAGNOSIS — R7309 Other abnormal glucose: Secondary | ICD-10-CM

## 2024-09-28 DIAGNOSIS — E7800 Pure hypercholesterolemia, unspecified: Secondary | ICD-10-CM

## 2024-09-29 ENCOUNTER — Ambulatory Visit: Payer: Self-pay | Admitting: Nurse Practitioner

## 2024-09-29 ENCOUNTER — Ambulatory Visit (INDEPENDENT_AMBULATORY_CARE_PROVIDER_SITE_OTHER): Payer: MEDICARE

## 2024-09-29 VITALS — BP 115/66 | HR 99 | Temp 98.3°F | Resp 16 | Ht 71.0 in | Wt 343.4 lb

## 2024-09-29 DIAGNOSIS — J4540 Moderate persistent asthma, uncomplicated: Secondary | ICD-10-CM | POA: Diagnosis not present

## 2024-09-29 LAB — COMPREHENSIVE METABOLIC PANEL WITH GFR
ALT: 17 IU/L (ref 0–44)
AST: 16 IU/L (ref 0–40)
Albumin: 4.3 g/dL (ref 3.9–4.9)
Alkaline Phosphatase: 77 IU/L (ref 47–123)
BUN/Creatinine Ratio: 32 — ABNORMAL HIGH (ref 10–24)
BUN: 28 mg/dL — ABNORMAL HIGH (ref 8–27)
Bilirubin Total: 0.4 mg/dL (ref 0.0–1.2)
CO2: 22 mmol/L (ref 20–29)
Calcium: 9.2 mg/dL (ref 8.6–10.2)
Chloride: 99 mmol/L (ref 96–106)
Creatinine, Ser: 0.87 mg/dL (ref 0.76–1.27)
Globulin, Total: 1.9 g/dL (ref 1.5–4.5)
Glucose: 98 mg/dL (ref 70–99)
Potassium: 4.5 mmol/L (ref 3.5–5.2)
Sodium: 136 mmol/L (ref 134–144)
Total Protein: 6.2 g/dL (ref 6.0–8.5)
eGFR: 95 mL/min/1.73 (ref >59)

## 2024-09-29 LAB — LIPID PANEL W/O CHOL/HDL RATIO
Cholesterol, Total: 166 mg/dL (ref 100–199)
HDL: 54 mg/dL (ref >39)
LDL Chol Calc (NIH): 99 mg/dL (ref 0–99)
Triglycerides: 69 mg/dL (ref 0–149)
VLDL Cholesterol Cal: 13 mg/dL (ref 5–40)

## 2024-09-29 LAB — HEMOGLOBIN A1C
Est. average glucose Bld gHb Est-mCnc: 114 mg/dL
Hgb A1c MFr Bld: 5.6 % (ref 4.8–5.6)

## 2024-09-29 MED ORDER — OMALIZUMAB 150 MG/ML ~~LOC~~ SOSY
300.0000 mg | PREFILLED_SYRINGE | Freq: Once | SUBCUTANEOUS | Status: AC
  Administered 2024-09-29: 300 mg via SUBCUTANEOUS

## 2024-09-29 NOTE — Progress Notes (Signed)
 Diagnosis: Asthma  Provider:  Chilton Greathouse MD  Procedure: Injection  Xolair (Omalizumab), Dose: 300 mg, Site: subcutaneous, Number of injections: 2  Injection Site(s): Left arm and Right arm  Post Care: Patient declined observation  Discharge: Condition: Stable, Destination: Home . AVS Declined  Performed by:  Wyvonne Lenz, RN

## 2024-09-29 NOTE — Progress Notes (Signed)
 Contacted via MyChart  Good morning Adithya, your labs have returned: - Kidney function, creatinine and eGFR, remains normal, as is liver function, AST and ALT.  - Lipid panel shows trend down in LDL, great job!!  Any questions? Keep making people smile. Keep being amazing!!  Thank you for allowing me to participate in your care.  I appreciate you. Kindest regards, Glennon Kopko

## 2024-10-01 ENCOUNTER — Ambulatory Visit: Payer: MEDICARE

## 2024-10-01 NOTE — Telephone Encounter (Signed)
 NFN

## 2024-10-05 ENCOUNTER — Other Ambulatory Visit: Payer: Self-pay | Admitting: Emergency Medicine

## 2024-10-12 ENCOUNTER — Encounter: Payer: Self-pay | Admitting: Nurse Practitioner

## 2024-10-12 ENCOUNTER — Ambulatory Visit: Payer: MEDICARE | Admitting: Nurse Practitioner

## 2024-10-12 VITALS — BP 120/66 | HR 87 | Temp 97.8°F | Ht 71.0 in | Wt 347.0 lb

## 2024-10-12 DIAGNOSIS — G4733 Obstructive sleep apnea (adult) (pediatric): Secondary | ICD-10-CM

## 2024-10-12 DIAGNOSIS — J4540 Moderate persistent asthma, uncomplicated: Secondary | ICD-10-CM

## 2024-10-12 NOTE — Assessment & Plan Note (Signed)
 Well managed on biologic therapy with Xolair . No recent exacerbations or hospitalizations. Continue current regimen. Action plan in place. Trigger prevention reviewed.  Patient Instructions  Continue Albuterol  inhaler 2 puffs or 3 mL neb every 6 hours as needed for shortness of breath or wheezing. Notify if symptoms persist despite rescue inhaler/neb use.  Continue budesonide  2 mL Twice daily. Brush tongue and rinse mouth afterwards  Continue flonase  nasal spray 2 sprays each nostril daily Continue montelukast  1 tab daily Continue claritin 1 tab daily Continue Xolair  injections as scheduled   Continue to use CPAP every night, minimum of 4-6 hours a night.  Change equipment as directed. Wash your tubing with warm soap and water daily, hang to dry. Wash humidifier portion weekly. Use bottled, distilled water and change daily Be aware of reduced alertness and do not drive or operate heavy machinery if experiencing this or drowsiness.  Exercise encouraged, as tolerated. Healthy weight management discussed.  Avoid or decrease alcohol consumption and medications that make you more sleepy, if possible. Notify if persistent daytime sleepiness occurs even with consistent use of PAP therapy.   Follow up in 6 months with Dr. Shelah. If symptoms do not improve or worsen, please contact office for sooner follow up or seek emergency care.
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
## 2024-10-12 NOTE — Patient Instructions (Signed)
 Continue Albuterol  inhaler 2 puffs or 3 mL neb every 6 hours as needed for shortness of breath or wheezing. Notify if symptoms persist despite rescue inhaler/neb use.  Continue budesonide  2 mL Twice daily. Brush tongue and rinse mouth afterwards  Continue flonase  nasal spray 2 sprays each nostril daily Continue montelukast  1 tab daily Continue claritin 1 tab daily Continue Xolair  injections as scheduled   Continue to use CPAP every night, minimum of 4-6 hours a night.  Change equipment as directed. Wash your tubing with warm soap and water daily, hang to dry. Wash humidifier portion weekly. Use bottled, distilled water and change daily Be aware of reduced alertness and do not drive or operate heavy machinery if experiencing this or drowsiness.  Exercise encouraged, as tolerated. Healthy weight management discussed.  Avoid or decrease alcohol consumption and medications that make you more sleepy, if possible. Notify if persistent daytime sleepiness occurs even with consistent use of PAP therapy.   Follow up in 6 months with Dr. Shelah. If symptoms do not improve or worsen, please contact office for sooner follow up or seek emergency care.

## 2024-10-12 NOTE — Assessment & Plan Note (Signed)
 Severe OSA on CPAP. Excellent compliance and control. Receives benefit from use. Aware of risks of untreated sleep apnea. Safe driving practices reviewed. Healthy weight management encouraged.

## 2024-10-15 ENCOUNTER — Ambulatory Visit: Payer: MEDICARE

## 2024-10-16 ENCOUNTER — Other Ambulatory Visit: Payer: Self-pay | Admitting: Nurse Practitioner

## 2024-10-17 ENCOUNTER — Encounter: Payer: Self-pay | Admitting: Nurse Practitioner

## 2024-10-20 NOTE — Telephone Encounter (Signed)
 Requested Prescriptions  Pending Prescriptions Disp Refills   fluticasone  (FLONASE ) 50 MCG/ACT nasal spray [Pharmacy Med Name: FLUTICASONE  NASAL SP (120) RX] 48 g 1    Sig: SHAKE LIQUID AND USE 2 SPRAYS IN EACH NOSTRIL EVERY DAY     Ear, Nose, and Throat: Nasal Preparations - Corticosteroids Passed - 10/20/2024 11:43 AM      Passed - Valid encounter within last 12 months    Recent Outpatient Visits           4 weeks ago Class 3 severe obesity due to excess calories with serious comorbidity and body mass index (BMI) of 45.0 to 49.9 in adult [facility])   [place] [facility] [place], Melanie T, NP   7 months ago Aortic atherosclerosis   [place] [facility] Valerio Melanie DASEN, NP       Future Appointments             In 8 months Claudene Lehmann, MD [facility]

## 2024-10-27 ENCOUNTER — Ambulatory Visit (INDEPENDENT_AMBULATORY_CARE_PROVIDER_SITE_OTHER): Payer: MEDICARE

## 2024-10-27 VITALS — BP 123/68 | HR 92 | Temp 98.0°F | Resp 14 | Ht 71.0 in | Wt 350.0 lb

## 2024-10-27 DIAGNOSIS — J4540 Moderate persistent asthma, uncomplicated: Secondary | ICD-10-CM | POA: Diagnosis not present

## 2024-10-27 MED ORDER — OMALIZUMAB 150 MG/ML ~~LOC~~ SOSY
300.0000 mg | PREFILLED_SYRINGE | Freq: Once | SUBCUTANEOUS | Status: AC
  Administered 2024-10-27: 300 mg via SUBCUTANEOUS
  Filled 2024-10-27: qty 2

## 2024-10-27 NOTE — Progress Notes (Signed)
 Diagnosis: Asthma  Provider:  Mannam, Praveen MD  Procedure: Injection  Xolair  (Omalizumab ), Dose: 300 mg, Site: subcutaneous, Number of injections: 2  Injection Site(s): Left arm and Right arm  Post Care: Patient declined observation  Discharge: Condition: Good, Destination: Home . AVS Declined  Performed by:  Rocky FORBES Sar, RN

## 2024-11-03 ENCOUNTER — Encounter: Payer: Self-pay | Admitting: Nurse Practitioner

## 2024-11-03 NOTE — Telephone Encounter (Signed)
 Patient came in and dropped off disability placard form to be completed. Explained that it could take 3-5 days to complete.

## 2024-11-13 ENCOUNTER — Telehealth: Payer: Self-pay | Admitting: Pharmacy Technician

## 2024-11-13 NOTE — Telephone Encounter (Signed)
 Auth Submission: NO AUTH NEEDED Site of care: Site of care: [facility] Payer: MEDICARE RAILROAD & [employer] SUPP Medication & CPT/J Code(s) submitted: Xolair  (Omalizumab ) J2357 Diagnosis Code: J45.50 Route of submission (phone, fax, portal):  Phone # Fax # Auth type: Buy/Bill PB Units/visits requested: Q4WKS Reference number:  Approval from: 11/13/24 to 11/28/25

## 2024-11-26 ENCOUNTER — Ambulatory Visit (INDEPENDENT_AMBULATORY_CARE_PROVIDER_SITE_OTHER): Payer: MEDICARE

## 2024-11-26 VITALS — BP 126/68 | HR 92 | Temp 97.8°F | Resp 20 | Ht 71.0 in | Wt 344.2 lb

## 2024-11-26 DIAGNOSIS — J4540 Moderate persistent asthma, uncomplicated: Secondary | ICD-10-CM | POA: Diagnosis not present

## 2024-11-26 MED ORDER — OMALIZUMAB 150 MG/ML ~~LOC~~ SOSY
300.0000 mg | PREFILLED_SYRINGE | Freq: Once | SUBCUTANEOUS | Status: AC
  Administered 2024-11-26: 300 mg via SUBCUTANEOUS

## 2024-11-26 NOTE — Progress Notes (Signed)
 Diagnosis: , Asthma  Provider:  Mannam, Praveen MD  Procedure: Injection  Xolair  (Omalizumab ), Dose: 300 mg, Site: subcutaneous, Number of injections: 1  Injection Site(s): Left arm  Post Care: Injection  Discharge: Condition: Good, Destination: Home . AVS Declined  Performed by:  Donny Childes, RN

## 2024-12-24 ENCOUNTER — Ambulatory Visit: Payer: MEDICARE

## 2025-01-21 ENCOUNTER — Ambulatory Visit: Payer: MEDICARE

## 2025-02-18 ENCOUNTER — Ambulatory Visit: Payer: MEDICARE

## 2025-03-18 ENCOUNTER — Ambulatory Visit: Payer: MEDICARE

## 2025-03-25 ENCOUNTER — Encounter: Payer: MEDICARE | Admitting: Nurse Practitioner

## 2025-04-15 ENCOUNTER — Ambulatory Visit: Payer: MEDICARE

## 2025-05-13 ENCOUNTER — Ambulatory Visit: Payer: MEDICARE

## 2025-06-10 ENCOUNTER — Ambulatory Visit: Payer: MEDICARE

## 2025-06-15 ENCOUNTER — Ambulatory Visit: Payer: MEDICARE

## 2025-06-22 ENCOUNTER — Ambulatory Visit: Payer: MEDICARE | Admitting: Dermatology

## 2025-07-08 ENCOUNTER — Ambulatory Visit: Payer: MEDICARE

## 2025-08-05 ENCOUNTER — Ambulatory Visit: Payer: MEDICARE

## 2025-08-31 ENCOUNTER — Ambulatory Visit: Payer: MEDICARE

## 2025-09-02 ENCOUNTER — Ambulatory Visit: Payer: MEDICARE

## 2025-10-28 ENCOUNTER — Ambulatory Visit: Payer: MEDICARE
